# Patient Record
Sex: Female | Born: 1935
Health system: Southern US, Community
[De-identification: ages and names within clinical notes are randomized; demographics above are authoritative.]

## PROBLEM LIST (undated history)

## (undated) DIAGNOSIS — J189 Pneumonia, unspecified organism: Secondary | ICD-10-CM

## (undated) DIAGNOSIS — Z9289 Personal history of other medical treatment: Secondary | ICD-10-CM

## (undated) DIAGNOSIS — E039 Hypothyroidism, unspecified: Secondary | ICD-10-CM

## (undated) DIAGNOSIS — I10 Essential (primary) hypertension: Secondary | ICD-10-CM

## (undated) DIAGNOSIS — E7800 Pure hypercholesterolemia, unspecified: Secondary | ICD-10-CM

## (undated) DIAGNOSIS — N390 Urinary tract infection, site not specified: Secondary | ICD-10-CM

## (undated) DIAGNOSIS — M1990 Unspecified osteoarthritis, unspecified site: Secondary | ICD-10-CM

## (undated) DIAGNOSIS — D649 Anemia, unspecified: Secondary | ICD-10-CM

## (undated) DIAGNOSIS — I639 Cerebral infarction, unspecified: Secondary | ICD-10-CM

## (undated) HISTORY — PX: VAGINAL HYSTERECTOMY: SUR661

## (undated) HISTORY — PX: JOINT REPLACEMENT: SHX530

## (undated) HISTORY — DX: Essential (primary) hypertension: I10

---

## 1989-05-11 DIAGNOSIS — I639 Cerebral infarction, unspecified: Secondary | ICD-10-CM

## 1989-05-11 HISTORY — PX: MUSCLE BIOPSY: SHX716

## 1989-05-11 HISTORY — DX: Cerebral infarction, unspecified: I63.9

## 1998-06-15 ENCOUNTER — Other Ambulatory Visit: Admission: RE | Admit: 1998-06-15 | Discharge: 1998-06-15 | Payer: Self-pay | Admitting: Obstetrics and Gynecology

## 2003-06-07 ENCOUNTER — Encounter: Admission: RE | Admit: 2003-06-07 | Discharge: 2003-06-07 | Payer: Self-pay | Admitting: Rheumatology

## 2003-06-07 ENCOUNTER — Encounter: Payer: Self-pay | Admitting: Rheumatology

## 2010-02-21 ENCOUNTER — Other Ambulatory Visit: Admission: RE | Admit: 2010-02-21 | Discharge: 2010-02-21 | Payer: Self-pay | Admitting: Oncology

## 2010-02-27 ENCOUNTER — Inpatient Hospital Stay (HOSPITAL_COMMUNITY): Admission: RE | Admit: 2010-02-27 | Discharge: 2010-03-02 | Payer: Self-pay | Admitting: Orthopedic Surgery

## 2010-04-03 ENCOUNTER — Inpatient Hospital Stay (HOSPITAL_COMMUNITY): Admission: RE | Admit: 2010-04-03 | Discharge: 2010-04-06 | Payer: Self-pay | Admitting: Orthopedic Surgery

## 2010-09-10 HISTORY — PX: BLADDER SUSPENSION: SHX72

## 2010-09-10 HISTORY — PX: TOTAL KNEE ARTHROPLASTY: SHX125

## 2010-11-25 LAB — BASIC METABOLIC PANEL
BUN: 11 mg/dL (ref 6–23)
CO2: 23 mEq/L (ref 19–32)
CO2: 23 mEq/L (ref 19–32)
CO2: 23 mEq/L (ref 19–32)
Calcium: 8.7 mg/dL (ref 8.4–10.5)
Calcium: 8.9 mg/dL (ref 8.4–10.5)
Calcium: 9.7 mg/dL (ref 8.4–10.5)
Chloride: 110 mEq/L (ref 96–112)
Creatinine, Ser: 0.7 mg/dL (ref 0.4–1.2)
Creatinine, Ser: 0.76 mg/dL (ref 0.4–1.2)
Creatinine, Ser: 0.88 mg/dL (ref 0.4–1.2)
GFR calc Af Amer: >60 mL/min (ref >60)
GFR calc Af Amer: >60 mL/min (ref >60)
GFR calc non Af Amer: >60 mL/min (ref >60)
Glucose, Bld: 119 mg/dL — ABNORMAL HIGH (ref 70–99)
Glucose, Bld: 130 mg/dL — ABNORMAL HIGH (ref 70–99)
Glucose, Bld: 138 mg/dL — ABNORMAL HIGH (ref 70–99)
Sodium: 133 mEq/L — ABNORMAL LOW (ref 135–145)

## 2010-11-25 LAB — CBC
HCT: 31 % — ABNORMAL LOW (ref 36.0–46.0)
Hemoglobin: 8.3 g/dL — ABNORMAL LOW (ref 12.0–15.0)
MCH: 29.9 pg (ref 26.0–34.0)
MCH: 30 pg (ref 26.0–34.0)
MCH: 30 pg (ref 26.0–34.0)
MCHC: 34.6 g/dL (ref 30.0–36.0)
MCHC: 34.7 g/dL (ref 30.0–36.0)
MCHC: 34.9 g/dL (ref 30.0–36.0)
MCV: 86.1 fL (ref 78.0–100.0)
Platelets: 161 10*3/uL (ref 150–400)
Platelets: 87 10*3/uL — ABNORMAL LOW (ref 150–400)
RBC: 2.34 MIL/uL — ABNORMAL LOW (ref 3.87–5.11)
RBC: 3.6 MIL/uL — ABNORMAL LOW (ref 3.87–5.11)
RDW: 15.4 % (ref 11.5–15.5)
RDW: 15.6 % — ABNORMAL HIGH (ref 11.5–15.5)
WBC: 6.8 10*3/uL (ref 4.0–10.5)

## 2010-11-25 LAB — URINALYSIS, ROUTINE W REFLEX MICROSCOPIC
Protein, ur: NEGATIVE mg/dL
Urobilinogen, UA: 0.2 mg/dL (ref 0.0–1.0)

## 2010-11-25 LAB — APTT: aPTT: 33 seconds (ref 24–37)

## 2010-11-25 LAB — HEMOGLOBIN AND HEMATOCRIT, BLOOD: Hemoglobin: 6.8 g/dL — CL (ref 12.0–15.0)

## 2010-11-25 LAB — TYPE AND SCREEN: ABO/RH(D): O POS

## 2010-11-25 LAB — DIFFERENTIAL: Monocytes Absolute: 0.5 10*3/uL (ref 0.1–1.0)

## 2010-11-25 LAB — SURGICAL PCR SCREEN: Staphylococcus aureus: NEGATIVE

## 2010-11-26 LAB — BASIC METABOLIC PANEL
CO2: 23 mEq/L (ref 19–32)
Calcium: 8.8 mg/dL (ref 8.4–10.5)
Creatinine, Ser: 0.89 mg/dL (ref 0.4–1.2)
GFR calc Af Amer: >60 mL/min (ref >60)
GFR calc Af Amer: >60 mL/min (ref >60)
GFR calc non Af Amer: >60 mL/min (ref >60)
GFR calc non Af Amer: >60 mL/min (ref >60)
Glucose, Bld: 132 mg/dL — ABNORMAL HIGH (ref 70–99)
Potassium: 4.5 mEq/L (ref 3.5–5.1)
Sodium: 130 mEq/L — ABNORMAL LOW (ref 135–145)
Sodium: 132 mEq/L — ABNORMAL LOW (ref 135–145)

## 2010-11-26 LAB — CBC
HCT: 23.9 % — ABNORMAL LOW (ref 36.0–46.0)
Hemoglobin: 8 g/dL — ABNORMAL LOW (ref 12.0–15.0)
Hemoglobin: 8.2 g/dL — ABNORMAL LOW (ref 12.0–15.0)
MCHC: 33.4 g/dL (ref 30.0–36.0)
RBC: 2.76 MIL/uL — ABNORMAL LOW (ref 3.87–5.11)
RBC: 2.87 MIL/uL — ABNORMAL LOW (ref 3.87–5.11)
RDW: 14.6 % (ref 11.5–15.5)

## 2010-11-26 LAB — ABO/RH: ABO/RH(D): O POS

## 2010-11-26 LAB — TYPE AND SCREEN: Antibody Screen: NEGATIVE

## 2010-11-27 LAB — URINALYSIS, ROUTINE W REFLEX MICROSCOPIC
Bilirubin Urine: NEGATIVE
Glucose, UA: NEGATIVE mg/dL
Hgb urine dipstick: NEGATIVE
Ketones, ur: NEGATIVE mg/dL
Protein, ur: NEGATIVE mg/dL
pH: 7 (ref 5.0–8.0)

## 2010-11-27 LAB — CBC
HCT: 30.3 % — ABNORMAL LOW (ref 36.0–46.0)
Hemoglobin: 10.3 g/dL — ABNORMAL LOW (ref 12.0–15.0)
MCHC: 34 g/dL (ref 30.0–36.0)
Platelets: 211 10*3/uL (ref 150–400)
RDW: 13.8 % (ref 11.5–15.5)

## 2010-11-27 LAB — BASIC METABOLIC PANEL
BUN: 18 mg/dL (ref 6–23)
CO2: 23 mEq/L (ref 19–32)
GFR calc non Af Amer: >60 mL/min (ref >60)
Glucose, Bld: 93 mg/dL (ref 70–99)
Potassium: 4.4 mEq/L (ref 3.5–5.1)
Sodium: 134 mEq/L — ABNORMAL LOW (ref 135–145)

## 2010-11-27 LAB — DIFFERENTIAL
Basophils Absolute: 0 10*3/uL (ref 0.0–0.1)
Basophils Relative: 1 % (ref 0–1)
Eosinophils Absolute: 0.1 10*3/uL (ref 0.0–0.7)
Eosinophils Relative: 1 % (ref 0–5)
Lymphocytes Relative: 22 % (ref 12–46)
Monocytes Absolute: 0.6 10*3/uL (ref 0.1–1.0)

## 2010-11-27 LAB — PROTIME-INR: Prothrombin Time: 15.5 seconds — ABNORMAL HIGH (ref 11.6–15.2)

## 2011-09-24 DIAGNOSIS — D649 Anemia, unspecified: Secondary | ICD-10-CM | POA: Diagnosis not present

## 2011-09-26 DIAGNOSIS — I10 Essential (primary) hypertension: Secondary | ICD-10-CM | POA: Diagnosis not present

## 2011-09-26 DIAGNOSIS — Z79899 Other long term (current) drug therapy: Secondary | ICD-10-CM | POA: Diagnosis not present

## 2011-09-26 DIAGNOSIS — Z6831 Body mass index (BMI) 31.0-31.9, adult: Secondary | ICD-10-CM | POA: Diagnosis not present

## 2011-09-26 DIAGNOSIS — I6789 Other cerebrovascular disease: Secondary | ICD-10-CM | POA: Diagnosis not present

## 2011-09-26 DIAGNOSIS — E785 Hyperlipidemia, unspecified: Secondary | ICD-10-CM | POA: Diagnosis not present

## 2011-10-15 DIAGNOSIS — R339 Retention of urine, unspecified: Secondary | ICD-10-CM | POA: Diagnosis not present

## 2011-10-15 DIAGNOSIS — N3090 Cystitis, unspecified without hematuria: Secondary | ICD-10-CM | POA: Diagnosis not present

## 2011-11-22 DIAGNOSIS — D649 Anemia, unspecified: Secondary | ICD-10-CM | POA: Diagnosis not present

## 2011-11-22 DIAGNOSIS — D509 Iron deficiency anemia, unspecified: Secondary | ICD-10-CM | POA: Diagnosis not present

## 2011-11-26 DIAGNOSIS — R339 Retention of urine, unspecified: Secondary | ICD-10-CM | POA: Diagnosis not present

## 2011-11-26 DIAGNOSIS — R351 Nocturia: Secondary | ICD-10-CM | POA: Diagnosis not present

## 2011-11-26 DIAGNOSIS — N390 Urinary tract infection, site not specified: Secondary | ICD-10-CM | POA: Diagnosis not present

## 2011-12-28 DIAGNOSIS — R339 Retention of urine, unspecified: Secondary | ICD-10-CM | POA: Diagnosis not present

## 2011-12-28 DIAGNOSIS — N3090 Cystitis, unspecified without hematuria: Secondary | ICD-10-CM | POA: Diagnosis not present

## 2011-12-28 DIAGNOSIS — N390 Urinary tract infection, site not specified: Secondary | ICD-10-CM | POA: Diagnosis not present

## 2012-01-01 DIAGNOSIS — I10 Essential (primary) hypertension: Secondary | ICD-10-CM | POA: Diagnosis not present

## 2012-01-01 DIAGNOSIS — E785 Hyperlipidemia, unspecified: Secondary | ICD-10-CM | POA: Diagnosis not present

## 2012-01-01 DIAGNOSIS — D509 Iron deficiency anemia, unspecified: Secondary | ICD-10-CM | POA: Diagnosis not present

## 2012-01-01 DIAGNOSIS — R0789 Other chest pain: Secondary | ICD-10-CM | POA: Diagnosis not present

## 2012-01-01 DIAGNOSIS — Z79899 Other long term (current) drug therapy: Secondary | ICD-10-CM | POA: Diagnosis not present

## 2012-01-01 DIAGNOSIS — I6789 Other cerebrovascular disease: Secondary | ICD-10-CM | POA: Diagnosis not present

## 2012-01-01 DIAGNOSIS — Z6831 Body mass index (BMI) 31.0-31.9, adult: Secondary | ICD-10-CM | POA: Diagnosis not present

## 2012-01-09 DIAGNOSIS — R748 Abnormal levels of other serum enzymes: Secondary | ICD-10-CM | POA: Diagnosis not present

## 2012-01-09 DIAGNOSIS — N281 Cyst of kidney, acquired: Secondary | ICD-10-CM | POA: Diagnosis not present

## 2012-01-14 DIAGNOSIS — Z1231 Encounter for screening mammogram for malignant neoplasm of breast: Secondary | ICD-10-CM | POA: Diagnosis not present

## 2012-01-17 DIAGNOSIS — R079 Chest pain, unspecified: Secondary | ICD-10-CM | POA: Diagnosis not present

## 2012-01-22 DIAGNOSIS — E785 Hyperlipidemia, unspecified: Secondary | ICD-10-CM | POA: Diagnosis not present

## 2012-01-22 DIAGNOSIS — N039 Chronic nephritic syndrome with unspecified morphologic changes: Secondary | ICD-10-CM | POA: Diagnosis not present

## 2012-01-22 DIAGNOSIS — D631 Anemia in chronic kidney disease: Secondary | ICD-10-CM | POA: Diagnosis not present

## 2012-01-22 DIAGNOSIS — N189 Chronic kidney disease, unspecified: Secondary | ICD-10-CM | POA: Diagnosis not present

## 2012-01-22 DIAGNOSIS — I10 Essential (primary) hypertension: Secondary | ICD-10-CM | POA: Diagnosis not present

## 2012-02-01 DIAGNOSIS — N390 Urinary tract infection, site not specified: Secondary | ICD-10-CM | POA: Diagnosis not present

## 2012-02-01 DIAGNOSIS — R339 Retention of urine, unspecified: Secondary | ICD-10-CM | POA: Diagnosis not present

## 2012-03-03 DIAGNOSIS — N3090 Cystitis, unspecified without hematuria: Secondary | ICD-10-CM | POA: Diagnosis not present

## 2012-03-03 DIAGNOSIS — N390 Urinary tract infection, site not specified: Secondary | ICD-10-CM | POA: Diagnosis not present

## 2012-03-03 DIAGNOSIS — R339 Retention of urine, unspecified: Secondary | ICD-10-CM | POA: Diagnosis not present

## 2012-03-24 DIAGNOSIS — D509 Iron deficiency anemia, unspecified: Secondary | ICD-10-CM | POA: Diagnosis not present

## 2012-04-02 DIAGNOSIS — N390 Urinary tract infection, site not specified: Secondary | ICD-10-CM | POA: Diagnosis not present

## 2012-04-02 DIAGNOSIS — R339 Retention of urine, unspecified: Secondary | ICD-10-CM | POA: Diagnosis not present

## 2012-04-07 DIAGNOSIS — Z6831 Body mass index (BMI) 31.0-31.9, adult: Secondary | ICD-10-CM | POA: Diagnosis not present

## 2012-04-07 DIAGNOSIS — I10 Essential (primary) hypertension: Secondary | ICD-10-CM | POA: Diagnosis not present

## 2012-04-07 DIAGNOSIS — E785 Hyperlipidemia, unspecified: Secondary | ICD-10-CM | POA: Diagnosis not present

## 2012-04-07 DIAGNOSIS — D509 Iron deficiency anemia, unspecified: Secondary | ICD-10-CM | POA: Diagnosis not present

## 2012-04-07 DIAGNOSIS — I6789 Other cerebrovascular disease: Secondary | ICD-10-CM | POA: Diagnosis not present

## 2012-05-07 DIAGNOSIS — N390 Urinary tract infection, site not specified: Secondary | ICD-10-CM | POA: Diagnosis not present

## 2012-05-07 DIAGNOSIS — R339 Retention of urine, unspecified: Secondary | ICD-10-CM | POA: Diagnosis not present

## 2012-05-23 DIAGNOSIS — N189 Chronic kidney disease, unspecified: Secondary | ICD-10-CM | POA: Diagnosis not present

## 2012-05-23 DIAGNOSIS — N039 Chronic nephritic syndrome with unspecified morphologic changes: Secondary | ICD-10-CM | POA: Diagnosis not present

## 2012-05-23 DIAGNOSIS — D631 Anemia in chronic kidney disease: Secondary | ICD-10-CM | POA: Diagnosis not present

## 2012-05-23 DIAGNOSIS — D509 Iron deficiency anemia, unspecified: Secondary | ICD-10-CM | POA: Diagnosis not present

## 2012-06-03 DIAGNOSIS — J342 Deviated nasal septum: Secondary | ICD-10-CM | POA: Diagnosis not present

## 2012-06-03 DIAGNOSIS — H9190 Unspecified hearing loss, unspecified ear: Secondary | ICD-10-CM | POA: Diagnosis not present

## 2012-06-03 DIAGNOSIS — H9319 Tinnitus, unspecified ear: Secondary | ICD-10-CM | POA: Diagnosis not present

## 2012-06-06 DIAGNOSIS — E291 Testicular hypofunction: Secondary | ICD-10-CM | POA: Diagnosis not present

## 2012-06-06 DIAGNOSIS — N200 Calculus of kidney: Secondary | ICD-10-CM | POA: Diagnosis not present

## 2012-06-12 DIAGNOSIS — H9120 Sudden idiopathic hearing loss, unspecified ear: Secondary | ICD-10-CM | POA: Diagnosis not present

## 2012-06-12 DIAGNOSIS — H905 Unspecified sensorineural hearing loss: Secondary | ICD-10-CM | POA: Diagnosis not present

## 2012-06-12 DIAGNOSIS — H903 Sensorineural hearing loss, bilateral: Secondary | ICD-10-CM | POA: Diagnosis not present

## 2012-06-12 DIAGNOSIS — H9319 Tinnitus, unspecified ear: Secondary | ICD-10-CM | POA: Diagnosis not present

## 2012-06-17 DIAGNOSIS — H905 Unspecified sensorineural hearing loss: Secondary | ICD-10-CM | POA: Diagnosis not present

## 2012-06-19 DIAGNOSIS — H9120 Sudden idiopathic hearing loss, unspecified ear: Secondary | ICD-10-CM | POA: Diagnosis not present

## 2012-06-19 DIAGNOSIS — H905 Unspecified sensorineural hearing loss: Secondary | ICD-10-CM | POA: Diagnosis not present

## 2012-06-24 DIAGNOSIS — H2589 Other age-related cataract: Secondary | ICD-10-CM | POA: Diagnosis not present

## 2012-07-07 DIAGNOSIS — N390 Urinary tract infection, site not specified: Secondary | ICD-10-CM | POA: Diagnosis not present

## 2012-07-07 DIAGNOSIS — R339 Retention of urine, unspecified: Secondary | ICD-10-CM | POA: Diagnosis not present

## 2012-07-09 DIAGNOSIS — H903 Sensorineural hearing loss, bilateral: Secondary | ICD-10-CM | POA: Diagnosis not present

## 2012-07-09 DIAGNOSIS — H9120 Sudden idiopathic hearing loss, unspecified ear: Secondary | ICD-10-CM | POA: Diagnosis not present

## 2012-07-09 DIAGNOSIS — H905 Unspecified sensorineural hearing loss: Secondary | ICD-10-CM | POA: Diagnosis not present

## 2012-07-10 DIAGNOSIS — D509 Iron deficiency anemia, unspecified: Secondary | ICD-10-CM | POA: Diagnosis not present

## 2012-07-10 DIAGNOSIS — Z23 Encounter for immunization: Secondary | ICD-10-CM | POA: Diagnosis not present

## 2012-07-10 DIAGNOSIS — I10 Essential (primary) hypertension: Secondary | ICD-10-CM | POA: Diagnosis not present

## 2012-07-10 DIAGNOSIS — E785 Hyperlipidemia, unspecified: Secondary | ICD-10-CM | POA: Diagnosis not present

## 2012-07-10 DIAGNOSIS — I6789 Other cerebrovascular disease: Secondary | ICD-10-CM | POA: Diagnosis not present

## 2012-07-14 DIAGNOSIS — D696 Thrombocytopenia, unspecified: Secondary | ICD-10-CM | POA: Diagnosis not present

## 2012-07-16 DIAGNOSIS — Q619 Cystic kidney disease, unspecified: Secondary | ICD-10-CM | POA: Diagnosis not present

## 2012-07-16 DIAGNOSIS — D696 Thrombocytopenia, unspecified: Secondary | ICD-10-CM | POA: Diagnosis not present

## 2012-07-16 DIAGNOSIS — R748 Abnormal levels of other serum enzymes: Secondary | ICD-10-CM | POA: Diagnosis not present

## 2012-07-16 DIAGNOSIS — R161 Splenomegaly, not elsewhere classified: Secondary | ICD-10-CM | POA: Diagnosis not present

## 2012-07-23 DIAGNOSIS — H9319 Tinnitus, unspecified ear: Secondary | ICD-10-CM | POA: Diagnosis not present

## 2012-07-23 DIAGNOSIS — H905 Unspecified sensorineural hearing loss: Secondary | ICD-10-CM | POA: Diagnosis not present

## 2012-07-23 DIAGNOSIS — H903 Sensorineural hearing loss, bilateral: Secondary | ICD-10-CM | POA: Diagnosis not present

## 2012-08-04 DIAGNOSIS — N390 Urinary tract infection, site not specified: Secondary | ICD-10-CM | POA: Diagnosis not present

## 2012-09-08 DIAGNOSIS — N3090 Cystitis, unspecified without hematuria: Secondary | ICD-10-CM | POA: Diagnosis not present

## 2012-09-08 DIAGNOSIS — R339 Retention of urine, unspecified: Secondary | ICD-10-CM | POA: Diagnosis not present

## 2012-10-13 DIAGNOSIS — I10 Essential (primary) hypertension: Secondary | ICD-10-CM | POA: Diagnosis not present

## 2012-10-13 DIAGNOSIS — E785 Hyperlipidemia, unspecified: Secondary | ICD-10-CM | POA: Diagnosis not present

## 2012-10-13 DIAGNOSIS — I6789 Other cerebrovascular disease: Secondary | ICD-10-CM | POA: Diagnosis not present

## 2012-10-13 DIAGNOSIS — D509 Iron deficiency anemia, unspecified: Secondary | ICD-10-CM | POA: Diagnosis not present

## 2012-10-13 DIAGNOSIS — Z6831 Body mass index (BMI) 31.0-31.9, adult: Secondary | ICD-10-CM | POA: Diagnosis not present

## 2012-10-21 DIAGNOSIS — N390 Urinary tract infection, site not specified: Secondary | ICD-10-CM | POA: Diagnosis not present

## 2012-10-29 DIAGNOSIS — M899 Disorder of bone, unspecified: Secondary | ICD-10-CM | POA: Diagnosis not present

## 2012-10-29 DIAGNOSIS — N951 Menopausal and female climacteric states: Secondary | ICD-10-CM | POA: Diagnosis not present

## 2012-11-27 DIAGNOSIS — Z862 Personal history of diseases of the blood and blood-forming organs and certain disorders involving the immune mechanism: Secondary | ICD-10-CM | POA: Diagnosis not present

## 2012-11-27 DIAGNOSIS — Z09 Encounter for follow-up examination after completed treatment for conditions other than malignant neoplasm: Secondary | ICD-10-CM | POA: Diagnosis not present

## 2012-12-19 DIAGNOSIS — N390 Urinary tract infection, site not specified: Secondary | ICD-10-CM | POA: Diagnosis not present

## 2013-01-19 DIAGNOSIS — Z6831 Body mass index (BMI) 31.0-31.9, adult: Secondary | ICD-10-CM | POA: Diagnosis not present

## 2013-01-19 DIAGNOSIS — E785 Hyperlipidemia, unspecified: Secondary | ICD-10-CM | POA: Diagnosis not present

## 2013-01-19 DIAGNOSIS — D509 Iron deficiency anemia, unspecified: Secondary | ICD-10-CM | POA: Diagnosis not present

## 2013-01-19 DIAGNOSIS — Z9181 History of falling: Secondary | ICD-10-CM | POA: Diagnosis not present

## 2013-01-19 DIAGNOSIS — I6789 Other cerebrovascular disease: Secondary | ICD-10-CM | POA: Diagnosis not present

## 2013-01-19 DIAGNOSIS — I10 Essential (primary) hypertension: Secondary | ICD-10-CM | POA: Diagnosis not present

## 2013-01-19 DIAGNOSIS — Z1331 Encounter for screening for depression: Secondary | ICD-10-CM | POA: Diagnosis not present

## 2013-01-19 DIAGNOSIS — N390 Urinary tract infection, site not specified: Secondary | ICD-10-CM | POA: Diagnosis not present

## 2013-01-23 DIAGNOSIS — Z1231 Encounter for screening mammogram for malignant neoplasm of breast: Secondary | ICD-10-CM | POA: Diagnosis not present

## 2013-02-11 DIAGNOSIS — K5730 Diverticulosis of large intestine without perforation or abscess without bleeding: Secondary | ICD-10-CM | POA: Diagnosis not present

## 2013-02-11 DIAGNOSIS — R945 Abnormal results of liver function studies: Secondary | ICD-10-CM | POA: Diagnosis not present

## 2013-02-12 DIAGNOSIS — N390 Urinary tract infection, site not specified: Secondary | ICD-10-CM | POA: Diagnosis not present

## 2013-02-12 DIAGNOSIS — R339 Retention of urine, unspecified: Secondary | ICD-10-CM | POA: Diagnosis not present

## 2013-04-16 DIAGNOSIS — N390 Urinary tract infection, site not specified: Secondary | ICD-10-CM | POA: Diagnosis not present

## 2013-04-24 DIAGNOSIS — I10 Essential (primary) hypertension: Secondary | ICD-10-CM | POA: Diagnosis not present

## 2013-04-24 DIAGNOSIS — Z6832 Body mass index (BMI) 32.0-32.9, adult: Secondary | ICD-10-CM | POA: Diagnosis not present

## 2013-04-24 DIAGNOSIS — I6789 Other cerebrovascular disease: Secondary | ICD-10-CM | POA: Diagnosis not present

## 2013-04-24 DIAGNOSIS — E785 Hyperlipidemia, unspecified: Secondary | ICD-10-CM | POA: Diagnosis not present

## 2013-04-24 DIAGNOSIS — D509 Iron deficiency anemia, unspecified: Secondary | ICD-10-CM | POA: Diagnosis not present

## 2013-05-18 DIAGNOSIS — R339 Retention of urine, unspecified: Secondary | ICD-10-CM | POA: Diagnosis not present

## 2013-05-18 DIAGNOSIS — N390 Urinary tract infection, site not specified: Secondary | ICD-10-CM | POA: Diagnosis not present

## 2013-05-20 DIAGNOSIS — N390 Urinary tract infection, site not specified: Secondary | ICD-10-CM | POA: Diagnosis not present

## 2013-06-16 DIAGNOSIS — L570 Actinic keratosis: Secondary | ICD-10-CM | POA: Diagnosis not present

## 2013-06-25 DIAGNOSIS — H2589 Other age-related cataract: Secondary | ICD-10-CM | POA: Diagnosis not present

## 2013-06-25 DIAGNOSIS — H524 Presbyopia: Secondary | ICD-10-CM | POA: Diagnosis not present

## 2013-07-28 DIAGNOSIS — N390 Urinary tract infection, site not specified: Secondary | ICD-10-CM | POA: Diagnosis not present

## 2013-07-29 DIAGNOSIS — Z6833 Body mass index (BMI) 33.0-33.9, adult: Secondary | ICD-10-CM | POA: Diagnosis not present

## 2013-07-29 DIAGNOSIS — I10 Essential (primary) hypertension: Secondary | ICD-10-CM | POA: Diagnosis not present

## 2013-07-29 DIAGNOSIS — I6789 Other cerebrovascular disease: Secondary | ICD-10-CM | POA: Diagnosis not present

## 2013-07-29 DIAGNOSIS — L659 Nonscarring hair loss, unspecified: Secondary | ICD-10-CM | POA: Diagnosis not present

## 2013-07-29 DIAGNOSIS — D509 Iron deficiency anemia, unspecified: Secondary | ICD-10-CM | POA: Diagnosis not present

## 2013-07-29 DIAGNOSIS — Z23 Encounter for immunization: Secondary | ICD-10-CM | POA: Diagnosis not present

## 2013-07-29 DIAGNOSIS — E785 Hyperlipidemia, unspecified: Secondary | ICD-10-CM | POA: Diagnosis not present

## 2013-08-12 DIAGNOSIS — I10 Essential (primary) hypertension: Secondary | ICD-10-CM | POA: Diagnosis not present

## 2013-08-12 DIAGNOSIS — Z6833 Body mass index (BMI) 33.0-33.9, adult: Secondary | ICD-10-CM | POA: Diagnosis not present

## 2013-08-12 DIAGNOSIS — Z23 Encounter for immunization: Secondary | ICD-10-CM | POA: Diagnosis not present

## 2013-08-17 DIAGNOSIS — R351 Nocturia: Secondary | ICD-10-CM | POA: Diagnosis not present

## 2013-08-17 DIAGNOSIS — N390 Urinary tract infection, site not specified: Secondary | ICD-10-CM | POA: Diagnosis not present

## 2013-08-17 DIAGNOSIS — R339 Retention of urine, unspecified: Secondary | ICD-10-CM | POA: Diagnosis not present

## 2013-08-26 DIAGNOSIS — E039 Hypothyroidism, unspecified: Secondary | ICD-10-CM | POA: Diagnosis not present

## 2013-09-24 DIAGNOSIS — N390 Urinary tract infection, site not specified: Secondary | ICD-10-CM | POA: Diagnosis not present

## 2013-09-28 DIAGNOSIS — Z96659 Presence of unspecified artificial knee joint: Secondary | ICD-10-CM | POA: Diagnosis not present

## 2013-09-28 DIAGNOSIS — M1710 Unilateral primary osteoarthritis, unspecified knee: Secondary | ICD-10-CM | POA: Diagnosis not present

## 2013-09-28 DIAGNOSIS — IMO0002 Reserved for concepts with insufficient information to code with codable children: Secondary | ICD-10-CM | POA: Diagnosis not present

## 2013-10-05 DIAGNOSIS — N390 Urinary tract infection, site not specified: Secondary | ICD-10-CM | POA: Diagnosis not present

## 2013-10-07 DIAGNOSIS — R161 Splenomegaly, not elsewhere classified: Secondary | ICD-10-CM | POA: Diagnosis not present

## 2013-10-07 DIAGNOSIS — I728 Aneurysm of other specified arteries: Secondary | ICD-10-CM | POA: Diagnosis not present

## 2013-10-07 DIAGNOSIS — R1031 Right lower quadrant pain: Secondary | ICD-10-CM | POA: Diagnosis not present

## 2013-10-07 DIAGNOSIS — R109 Unspecified abdominal pain: Secondary | ICD-10-CM | POA: Diagnosis not present

## 2013-10-07 DIAGNOSIS — N390 Urinary tract infection, site not specified: Secondary | ICD-10-CM | POA: Diagnosis not present

## 2013-10-12 DIAGNOSIS — Z6832 Body mass index (BMI) 32.0-32.9, adult: Secondary | ICD-10-CM | POA: Diagnosis not present

## 2013-10-12 DIAGNOSIS — N390 Urinary tract infection, site not specified: Secondary | ICD-10-CM | POA: Diagnosis not present

## 2013-10-12 DIAGNOSIS — R1031 Right lower quadrant pain: Secondary | ICD-10-CM | POA: Diagnosis not present

## 2013-10-12 DIAGNOSIS — I728 Aneurysm of other specified arteries: Secondary | ICD-10-CM | POA: Diagnosis not present

## 2013-10-23 ENCOUNTER — Encounter: Payer: Self-pay | Admitting: Surgery

## 2013-10-26 ENCOUNTER — Encounter: Payer: Self-pay | Admitting: Surgery

## 2013-11-12 ENCOUNTER — Encounter: Payer: Self-pay | Admitting: Vascular Surgery

## 2013-11-23 DIAGNOSIS — R339 Retention of urine, unspecified: Secondary | ICD-10-CM | POA: Diagnosis not present

## 2013-11-23 DIAGNOSIS — N390 Urinary tract infection, site not specified: Secondary | ICD-10-CM | POA: Diagnosis not present

## 2013-11-25 DIAGNOSIS — E039 Hypothyroidism, unspecified: Secondary | ICD-10-CM | POA: Diagnosis not present

## 2013-11-25 DIAGNOSIS — I10 Essential (primary) hypertension: Secondary | ICD-10-CM | POA: Diagnosis not present

## 2013-11-25 DIAGNOSIS — D509 Iron deficiency anemia, unspecified: Secondary | ICD-10-CM | POA: Diagnosis not present

## 2013-11-25 DIAGNOSIS — I6789 Other cerebrovascular disease: Secondary | ICD-10-CM | POA: Diagnosis not present

## 2013-11-25 DIAGNOSIS — E785 Hyperlipidemia, unspecified: Secondary | ICD-10-CM | POA: Diagnosis not present

## 2013-11-25 DIAGNOSIS — Z6831 Body mass index (BMI) 31.0-31.9, adult: Secondary | ICD-10-CM | POA: Diagnosis not present

## 2013-12-03 ENCOUNTER — Encounter: Payer: Self-pay | Admitting: Vascular Surgery

## 2013-12-04 ENCOUNTER — Ambulatory Visit (INDEPENDENT_AMBULATORY_CARE_PROVIDER_SITE_OTHER): Payer: Medicare Other | Admitting: Vascular Surgery

## 2013-12-04 ENCOUNTER — Encounter: Payer: Self-pay | Admitting: Vascular Surgery

## 2013-12-04 VITALS — BP 143/81 | HR 95 | Resp 16 | Ht 67.0 in | Wt 201.0 lb

## 2013-12-04 DIAGNOSIS — Z01810 Encounter for preprocedural cardiovascular examination: Secondary | ICD-10-CM

## 2013-12-04 DIAGNOSIS — I728 Aneurysm of other specified arteries: Secondary | ICD-10-CM | POA: Insufficient documentation

## 2013-12-04 DIAGNOSIS — Z23 Encounter for immunization: Secondary | ICD-10-CM | POA: Diagnosis not present

## 2013-12-04 NOTE — Progress Notes (Signed)
[facility] [place]  Referred by:  [facility] ED  Reason for referral: splenic artery aneurysm  History of Present Illness  [name] is a 80 y.o. (19-Nov-1933) female who presents with chief complaint: splenic artery aneurysm.  Pt was sent in ED for severe RLQ pain.  Work-up at that time revealed: UTI and stable SAA 3.2 cm x 3.3 cm.  The patient denies any abdominal pain at this time.  She is not a smoker and has no family history of aneurysmal disease.  She has previously had two children.    Past Medical History  Diagnosis Date  . Hypertension    Past Surgical History  Procedure Laterality Date  . Arm surgery    . Abdominal hysterectomy    . Bladder surgery    . Joint replacement      knee bilateral   History   Social History  . Marital Status: Married    Spouse Name: N/A    Number of Children: N/A  . Years of Education: N/A   Occupational History  . Not on file.   Social History Main Topics  . Smoking status: Never Smoker   . Smokeless tobacco: Never Used  . Alcohol Use: No  . Drug Use: No  . Sexual Activity: Not on file   Other Topics Concern  . Not on file   Social History Narrative  . No narrative on file   Family History: neither mom nor father had significant medical problems which would contribute this patient's condition, no aneurysmal history  Current Outpatient Prescriptions  Medication Sig Dispense Refill  . bethanechol (URECHOLINE) 50 MG tablet Take 50 mg by mouth 2 (two) times daily.      . Calcium Carbonate-Vitamin D (CALCIUM 500 + D PO) Take 500 mg by mouth 2 (two) times daily.      . cholecalciferol (VITAMIN D) 1000 UNITS tablet Take 2,000 Units by mouth daily.      . ciprofloxacin (CIPRO) 250 MG/5ML (5%) SUSR Take 250 mg by mouth 2 (two) times daily.      . clopidogrel (PLAVIX) 75 MG tablet Take 75 mg by mouth daily with breakfast.      . Cranberry-Vitamin C-Vitamin E (CRANBERRY PLUS VITAMIN C) 140-100-3  MG-MG-UNIT CAPS Take 2,000 Units by mouth 2 (two) times daily.      Marland Kitchen ezetimibe (ZETIA) 10 MG tablet Take 10 mg by mouth daily.      . folic acid (FOLVITE) 400 MCG tablet Take 400 mcg by mouth daily.      Marland Kitchen labetalol (NORMODYNE) 200 MG tablet Take 200 mg by mouth 2 (two) times daily. Take 1 1/2 tablets twice daily      . LEVOTHYROXINE SODIUM PO Take 0.5 mg by mouth.      Marland Kitchen lisinopril (PRINIVIL,ZESTRIL) 40 MG tablet Take 40 mg by mouth daily.      . nitrofurantoin (MACRODANTIN) 100 MG capsule Take 100 mg by mouth daily.      . Omega-3 Fatty Acids (FISH OIL) 1000 MG CAPS Take 1,000 mg by mouth 2 (two) times daily.      . psyllium (METAMUCIL) 58.6 % packet Take 1 packet by mouth daily.      . tamsulosin (FLOMAX) 0.4 MG CAPS capsule Take 0.4 mg by mouth 2 (two) times daily.       No current facility-administered medications for this visit.     Allergies  Allergen Reactions  . Aspirin Other (See Comments)  Causes blood in urine   REVIEW OF SYSTEMS:  (Positives checked otherwise negative)  CARDIOVASCULAR:  []  chest pain, []  chest pressure, []  palpitations, []  shortness of breath when laying flat, []  shortness of breath with exertion,  []  pain in feet when walking, []  pain in feet when laying flat, []  history of blood clot in veins (DVT), []  history of phlebitis, []  swelling in legs, []  varicose veins  PULMONARY:  []  productive cough, []  asthma, []  wheezing  NEUROLOGIC:  []  weakness in arms or legs, []  numbness in arms or legs, []  difficulty speaking or slurred speech, []  temporary loss of vision in one eye, []  dizziness  HEMATOLOGIC:  []  bleeding problems, []  problems with blood clotting too easily  MUSCULOSKEL:  []  joint pain, []  joint swelling  GASTROINTEST:  []  vomiting blood, []  blood in stool     GENITOURINARY:  []  burning with urination, [x]  blood in urine: reported due to ASA  PSYCHIATRIC:  []  history of major depression  INTEGUMENTARY:  []  rashes, []  ulcers  CONSTITUTIONAL:   []  fever, []  chills  For VQI Use Only  PRE-ADM LIVING: Home  AMB STATUS: Ambulatory  CAD Sx: None  PRIOR CHF: None  STRESS TEST: [x]  No, [ ]  Normal, [ ]  + ischemia, [ ]  + MI, [ ]  Both  Physical Examination  Filed Vitals:   12/04/13 1257  BP: 143/81  Pulse: 95  Resp: 16  Height: 5\' 7"  (1.702 m)  Weight: 201 lb (91.173 kg)    Body mass index is 31.47 kg/(m^2).  General: A&O x 3, WD, elderly  Head: [place]/AT  Ear/Nose/Throat: Hearing grossly intact, nares w/o erythema or drainage, oropharynx w/o Erythema/Exudate, Mallampati score: 3  Eyes: PERRLA, EOMI  Neck: Supple, no nuchal rigidity, no palpable LAD  Pulmonary: Sym exp, good air movt, CTAB, no rales, rhonchi, & wheezing  Cardiac: RRR, Nl S1, S2, no Murmurs, rubs or gallops  Vascular: Vessel Right Left  Radial Palpable Palpable  Ulnar Palpable Palpable  Brachial Palpable Palpable  Carotid Palpable, without bruit Palpable, without bruit  Aorta Not palpable N/A  Femoral Palpable Palpable  Popliteal No palpable aneurysm No palpable aneurysm  PT  Palpable  Palpable  DP  Palpable  Palpable   Gastrointestinal: soft, NTND, -G/R, - HSM, - masses, - CVAT B, no AAA palpable  Musculoskeletal: M/S 5/5 throughout , Extremities without ischemic changes , B spider veins, B extensive varicose veins  Neurologic: CN 2-12 intact , Pain and light touch intact in extremities , Motor exam as listed above  Psychiatric: Judgment intact, Mood & affect appropriate for pt's clinical situation  Dermatologic: See M/S exam for extremity exam, no rashes otherwise noted  Lymph : No Cervical, Axillary, or Inguinal lymphadenopathy   Outside Studies/Documentation 5 pages of outside documents were reviewed including: OSH ED lab work and CT report.  Medical Decision Making  [name] is a 80 y.o. female who presents with: asx SAA > 2 cm   The primary risk of SAA is rupture, so repair is recommended once SAA is > 2 cm, hence,  this patient will need repair of the SAA.    Modern repair of such tends to be via an endovascular technique with splenectomy reserved for splenic infarction or infection from infarction.  A CTA abd/pelvis will be needed to get important anatomic information to determine if she is a candidate for stenting vs embolization.  I discussed with the patient the importance of the spleen in fighting infections.  I recommend vaccination well advance of possible splenic intervention for encapsulated organisms: H. Influenza, pneumococcal, and meningococal.  I have given her a rx to get such vaccinations from her PCP.  The patient will follow up with me in 4 weeks with the CTA.  I anticipate getting the patient scheduled within the 1-2 weeks of her follow-up, hence the importance of her getting the vaccines now.  Thank you for allowing [place] to participate in this patient's care.  Leonides Sake, MD [facility] [place] Office: [phone] Pager: [phone]  12/04/2013, 1:36 PM

## 2013-12-07 NOTE — Addendum Note (Signed)
Addended by: Mena Goes on: 12/07/2013 05:38 PM   Modules accepted: Orders

## 2013-12-09 DIAGNOSIS — Z01810 Encounter for preprocedural cardiovascular examination: Secondary | ICD-10-CM | POA: Diagnosis not present

## 2013-12-16 DIAGNOSIS — N390 Urinary tract infection, site not specified: Secondary | ICD-10-CM | POA: Diagnosis not present

## 2013-12-30 ENCOUNTER — Encounter: Payer: Self-pay | Admitting: Vascular Surgery

## 2013-12-31 ENCOUNTER — Ambulatory Visit (INDEPENDENT_AMBULATORY_CARE_PROVIDER_SITE_OTHER): Payer: Medicare Other | Admitting: Vascular Surgery

## 2013-12-31 ENCOUNTER — Ambulatory Visit
Admission: RE | Admit: 2013-12-31 | Discharge: 2013-12-31 | Disposition: A | Discharge status: Home / Self Care | Payer: Medicare Other | Source: Ambulatory Visit | Attending: Vascular Surgery | Admitting: Vascular Surgery

## 2013-12-31 ENCOUNTER — Encounter: Payer: Self-pay | Admitting: Vascular Surgery

## 2013-12-31 VITALS — BP 161/99 | HR 67 | Resp 18 | Ht 67.0 in | Wt 199.0 lb

## 2013-12-31 DIAGNOSIS — I728 Aneurysm of other specified arteries: Secondary | ICD-10-CM

## 2013-12-31 DIAGNOSIS — I722 Aneurysm of renal artery: Secondary | ICD-10-CM | POA: Diagnosis not present

## 2013-12-31 DIAGNOSIS — Z01810 Encounter for preprocedural cardiovascular examination: Secondary | ICD-10-CM

## 2013-12-31 MED ORDER — IOHEXOL 350 MG/ML SOLN
100.0000 mL | Freq: Once | INTRAVENOUS | Status: AC | PRN
  Administered 2013-12-31: 100 mL via INTRAVENOUS

## 2013-12-31 NOTE — Progress Notes (Signed)
Established Splenic Artery Aneurysm  History of Present Illness  The patient is a 80 y.o. (Sep 21, 1933) female who presents with chief complaint: follow up for SAA.  Previous studies demonstrate an SAA, measuring 3.1 cm.  The patient does not have back or abdominal pain.  The patient is not a smoker.  She returns today from her CTA Abd/pelvis.  The pt notes having gotten: H. Influenza, Pneumococcal, and meningococcal Vac.  The patient's PMH, PSH, SH, FamHx, Med, and Allergies are unchanged from 12/04/13.  On ROS today: no abd pain, no fever or chills  Physical Examination  Filed Vitals:   12/31/13 1308  BP: 161/99  Pulse: 67  Resp: 18  Height: 5\' 7"  (1.702 m)  Weight: 199 lb (90.266 kg)   Body mass index is 31.16 kg/(m^2).  General: A&O x 3, WD, mildly obese  Pulmonary: Sym exp, good air movt, CTAB, no rales, rhonchi, & wheezing  Cardiac: RRR, Nl S1, S2, no Murmurs, rubs or gallops  Vascular: Vessel Right Left  Radial Palpable Palpable  Brachial Palpable Palpable  Carotid Palpable, without bruit Palpable, without bruit  Aorta Not palpable N/A  Femoral Palpable Palpable  Popliteal Not palpable Not palpable  PT Palpable Palpable  DP Palpable Palpable   Gastrointestinal: soft, NTND, -G/R, - HSM, - masses, - CVAT B  Musculoskeletal: M/S 5/5 throughout , Extremities without ischemic changes   Neurologic: Pain and light touch intact in extremities , Motor exam as listed above  CTA Abd/Pelvis (12/31/2013)  1. Slight enlargement of 3.2 cm distal splenic artery aneurysm. This should be approachable for percutaneous embolization.  2. Stable mild splenomegaly.  3. 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  Based on my review of the CTA, pt has a distal SAA with significant calcification.  There is substantial aortic tortuosity and there appears to be possible two branches off the aneurysm, though one disappears on the recon  Medical Decision  Making  [name] is a 80 y.o. female  who presents with: asymptomatic large SAA    I recommend splenic angiogram and embolization of the SAA as the SAA > 2.0 cm due its risk for bleeding. I discussed with the patient the nature of angiographic procedures, especially the limited patencies of any endovascular intervention.  The patient is aware of that the risks of an angiographic procedure include but are not limited to: bleeding, infection, access site complications, renal failure, embolization, rupture of vessel, dissection, possible need for emergent surgical intervention, possible need for surgical procedures to treat the patient's pathology, anaphylactic reaction to contrast, and stroke and death.   Additionally, the patient is aware of the risk of splenic infarction and subsequent complications including splenic abscess which might subsequently require splenectomy.   The patient is aware of the risks and agrees to proceed. Her son is in the hospital due to injury and will be undergoing surgery.  The patient wants to wait until he recovers to proceed. The patient has been educated on what sx to expect if the aneurysm begins bleeding.  She will report immediately to the ER in that case.  Thank you for allowing [place] to participate in this patient's care.  Leonides Sake, MD [facility] [place] Office: [phone] Pager: [phone]  12/31/2013, 1:32 PM

## 2014-01-13 DIAGNOSIS — N390 Urinary tract infection, site not specified: Secondary | ICD-10-CM | POA: Diagnosis not present

## 2014-01-18 ENCOUNTER — Other Ambulatory Visit: Payer: Self-pay | Admitting: *Deleted

## 2014-01-22 ENCOUNTER — Encounter (HOSPITAL_COMMUNITY): Payer: Self-pay | Admitting: Pharmacy Technician

## 2014-01-28 ENCOUNTER — Encounter (HOSPITAL_COMMUNITY)
Admission: RE | Disposition: A | Discharge status: Home / Self Care | Payer: Self-pay | Source: Ambulatory Visit | Attending: Vascular Surgery

## 2014-01-28 ENCOUNTER — Observation Stay (HOSPITAL_COMMUNITY)
Admission: RE | Admit: 2014-01-28 | Discharge: 2014-01-29 | Disposition: A | Discharge status: Home / Self Care | Payer: Medicare Other | Source: Ambulatory Visit | Attending: Vascular Surgery | Admitting: Vascular Surgery

## 2014-01-28 ENCOUNTER — Encounter (HOSPITAL_COMMUNITY): Payer: Self-pay | Admitting: General Practice

## 2014-01-28 DIAGNOSIS — I728 Aneurysm of other specified arteries: Principal | ICD-10-CM | POA: Insufficient documentation

## 2014-01-28 HISTORY — DX: Urinary tract infection, site not specified: N39.0

## 2014-01-28 HISTORY — DX: Anemia, unspecified: D64.9

## 2014-01-28 HISTORY — PX: SPLENIC ARTERY EMBOLIZATION: SHX2430

## 2014-01-28 HISTORY — DX: Personal history of other medical treatment: Z92.89

## 2014-01-28 HISTORY — DX: Pure hypercholesterolemia, unspecified: E78.00

## 2014-01-28 HISTORY — DX: Hypothyroidism, unspecified: E03.9

## 2014-01-28 HISTORY — PX: EMBOLIZATION: SHX5507

## 2014-01-28 HISTORY — DX: Pneumonia, unspecified organism: J18.9

## 2014-01-28 HISTORY — DX: Cerebral infarction, unspecified: I63.9

## 2014-01-28 HISTORY — PX: VISCERAL ANGIOGRAM: SHX5515

## 2014-01-28 HISTORY — DX: Unspecified osteoarthritis, unspecified site: M19.90

## 2014-01-28 LAB — CREATININE, SERUM
CREATININE: 0.75 mg/dL (ref 0.50–1.10)
GFR calc non Af Amer: 80 mL/min — ABNORMAL LOW (ref >90)

## 2014-01-28 LAB — POCT I-STAT, CHEM 8
BUN: 26 mg/dL — AB (ref 6–23)
Calcium, Ion: 1.26 mmol/L (ref 1.13–1.30)
Chloride: 106 mEq/L (ref 96–112)
Creatinine, Ser: 0.9 mg/dL (ref 0.50–1.10)
Glucose, Bld: 101 mg/dL — ABNORMAL HIGH (ref 70–99)
HEMATOCRIT: 35 % — AB (ref 36.0–46.0)
Hemoglobin: 11.9 g/dL — ABNORMAL LOW (ref 12.0–15.0)
POTASSIUM: 4.2 meq/L (ref 3.7–5.3)
Sodium: 141 mEq/L (ref 137–147)
TCO2: 20 mmol/L (ref 0–100)

## 2014-01-28 LAB — CBC
HCT: 33.3 % — ABNORMAL LOW (ref 36.0–46.0)
Hemoglobin: 11.8 g/dL — ABNORMAL LOW (ref 12.0–15.0)
MCH: 32.3 pg (ref 26.0–34.0)
MCHC: 35.4 g/dL (ref 30.0–36.0)
MCV: 91.2 fL (ref 78.0–100.0)
Platelets: 104 10*3/uL — ABNORMAL LOW (ref 150–400)
RBC: 3.65 MIL/uL — AB (ref 3.87–5.11)
RDW: 13 % (ref 11.5–15.5)
WBC: 4.4 10*3/uL (ref 4.0–10.5)

## 2014-01-28 LAB — POCT ACTIVATED CLOTTING TIME
ACTIVATED CLOTTING TIME: 182 s
Activated Clotting Time: 171 seconds

## 2014-01-28 SURGERY — VISCERAL ANGIOGRAM
Anesthesia: LOCAL

## 2014-01-28 MED ORDER — FENTANYL CITRATE 0.05 MG/ML IJ SOLN
INTRAMUSCULAR | Status: AC
  Filled 2014-01-28: qty 2

## 2014-01-28 MED ORDER — HEPARIN SODIUM (PORCINE) 1000 UNIT/ML IJ SOLN
INTRAMUSCULAR | Status: AC
  Filled 2014-01-28: qty 1

## 2014-01-28 MED ORDER — OXYCODONE-ACETAMINOPHEN 5-325 MG PO TABS: 1.0000 | ORAL_TABLET | ORAL | Status: DC | PRN

## 2014-01-28 MED ORDER — ACETAMINOPHEN 325 MG PO TABS: 325.0000 mg | ORAL_TABLET | ORAL | Status: DC | PRN

## 2014-01-28 MED ORDER — PANTOPRAZOLE SODIUM 40 MG PO TBEC: 40.0000 mg | DELAYED_RELEASE_TABLET | Freq: Every day | ORAL | Status: DC

## 2014-01-28 MED ORDER — ZOLPIDEM TARTRATE 5 MG PO TABS: 5.0000 mg | ORAL_TABLET | Freq: Every evening | ORAL | Status: DC | PRN

## 2014-01-28 MED ORDER — SODIUM CHLORIDE 0.9 % IV SOLN: 1.0000 mL/kg/h | INTRAVENOUS | Status: DC

## 2014-01-28 MED ORDER — BETHANECHOL CHLORIDE 25 MG PO TABS
50.0000 mg | ORAL_TABLET | Freq: Two times a day (BID) | ORAL | Status: DC
  Administered 2014-01-28 – 2014-01-29 (×2): 50 mg via ORAL
  Filled 2014-01-28 (×4): qty 2

## 2014-01-28 MED ORDER — NITROFURANTOIN MACROCRYSTAL 100 MG PO CAPS
100.0000 mg | ORAL_CAPSULE | Freq: Every day | ORAL | Status: DC
  Administered 2014-01-28: 100 mg via ORAL
  Filled 2014-01-28 (×2): qty 1

## 2014-01-28 MED ORDER — LEVOTHYROXINE SODIUM 50 MCG PO TABS
50.0000 ug | ORAL_TABLET | Freq: Every day | ORAL | Status: DC
  Administered 2014-01-29: 50 ug via ORAL
  Filled 2014-01-28 (×2): qty 1

## 2014-01-28 MED ORDER — MORPHINE SULFATE 2 MG/ML IJ SOLN: 2.0000 mg | INTRAMUSCULAR | Status: DC | PRN

## 2014-01-28 MED ORDER — ONDANSETRON HCL 4 MG/2ML IJ SOLN: 4.0000 mg | Freq: Four times a day (QID) | INTRAMUSCULAR | Status: DC | PRN

## 2014-01-28 MED ORDER — CLOPIDOGREL BISULFATE 75 MG PO TABS
75.0000 mg | ORAL_TABLET | Freq: Every day | ORAL | Status: DC
  Administered 2014-01-28: 75 mg via ORAL
  Filled 2014-01-28: qty 1

## 2014-01-28 MED ORDER — MIDAZOLAM HCL 2 MG/2ML IJ SOLN
INTRAMUSCULAR | Status: AC
  Filled 2014-01-28: qty 2

## 2014-01-28 MED ORDER — EZETIMIBE 10 MG PO TABS
10.0000 mg | ORAL_TABLET | Freq: Every day | ORAL | Status: DC
  Administered 2014-01-28: 10 mg via ORAL
  Filled 2014-01-28 (×2): qty 1

## 2014-01-28 MED ORDER — LABETALOL HCL 5 MG/ML IV SOLN: 10.0000 mg | INTRAVENOUS | Status: DC | PRN

## 2014-01-28 MED ORDER — DOCUSATE SODIUM 100 MG PO CAPS
100.0000 mg | ORAL_CAPSULE | Freq: Two times a day (BID) | ORAL | Status: DC
  Administered 2014-01-28: 100 mg via ORAL
  Filled 2014-01-28 (×3): qty 1

## 2014-01-28 MED ORDER — ACETAMINOPHEN 650 MG RE SUPP: 325.0000 mg | RECTAL | Status: DC | PRN

## 2014-01-28 MED ORDER — LIDOCAINE HCL (PF) 1 % IJ SOLN
INTRAMUSCULAR | Status: AC
  Filled 2014-01-28: qty 30

## 2014-01-28 MED ORDER — ONDANSETRON HCL 4 MG/2ML IJ SOLN
INTRAMUSCULAR | Status: AC
  Filled 2014-01-28: qty 2

## 2014-01-28 MED ORDER — OXYCODONE HCL 5 MG PO TABS: 5.0000 mg | ORAL_TABLET | ORAL | Status: DC | PRN

## 2014-01-28 MED ORDER — TAMSULOSIN HCL 0.4 MG PO CAPS
0.4000 mg | ORAL_CAPSULE | Freq: Two times a day (BID) | ORAL | Status: DC
  Administered 2014-01-28 – 2014-01-29 (×2): 0.4 mg via ORAL
  Filled 2014-01-28 (×3): qty 1

## 2014-01-28 MED ORDER — GUAIFENESIN-DM 100-10 MG/5ML PO SYRP
15.0000 mL | ORAL_SOLUTION | ORAL | Status: DC | PRN
  Filled 2014-01-28: qty 15

## 2014-01-28 MED ORDER — POTASSIUM CHLORIDE CRYS ER 20 MEQ PO TBCR: 20.0000 meq | EXTENDED_RELEASE_TABLET | Freq: Once | ORAL | Status: DC

## 2014-01-28 MED ORDER — PHENOL 1.4 % MT LIQD
1.0000 | OROMUCOSAL | Status: DC | PRN
  Filled 2014-01-28: qty 177

## 2014-01-28 MED ORDER — HYDRALAZINE HCL 20 MG/ML IJ SOLN
INTRAMUSCULAR | Status: AC
  Filled 2014-01-28: qty 1

## 2014-01-28 MED ORDER — HEPARIN (PORCINE) IN NACL 2-0.9 UNIT/ML-% IJ SOLN
INTRAMUSCULAR | Status: AC
  Filled 2014-01-28: qty 1000

## 2014-01-28 MED ORDER — LABETALOL HCL 300 MG PO TABS
300.0000 mg | ORAL_TABLET | Freq: Two times a day (BID) | ORAL | Status: DC
  Administered 2014-01-28: 300 mg via ORAL
  Administered 2014-01-29: 150 mg via ORAL
  Filled 2014-01-28 (×3): qty 1

## 2014-01-28 MED ORDER — ENOXAPARIN SODIUM 40 MG/0.4ML ~~LOC~~ SOLN
40.0000 mg | SUBCUTANEOUS | Status: DC
  Administered 2014-01-28: 40 mg via SUBCUTANEOUS
  Filled 2014-01-28 (×2): qty 0.4

## 2014-01-28 MED ORDER — METOPROLOL TARTRATE 1 MG/ML IV SOLN: 2.0000 mg | INTRAVENOUS | Status: DC | PRN

## 2014-01-28 MED ORDER — SODIUM CHLORIDE 0.9 % IV SOLN
INTRAVENOUS | Status: DC
  Administered 2014-01-28: 08:00:00 via INTRAVENOUS

## 2014-01-28 MED ORDER — LISINOPRIL 40 MG PO TABS
40.0000 mg | ORAL_TABLET | Freq: Every day | ORAL | Status: DC
  Filled 2014-01-28: qty 1

## 2014-01-28 MED ORDER — HYDRALAZINE HCL 20 MG/ML IJ SOLN: 10.0000 mg | INTRAMUSCULAR | Status: DC | PRN

## 2014-01-28 MED ORDER — ALUM & MAG HYDROXIDE-SIMETH 200-200-20 MG/5ML PO SUSP: 15.0000 mL | ORAL | Status: DC | PRN

## 2014-01-28 NOTE — Interval H&P Note (Signed)
[facility] [place]  History and Physical Update  The patient was interviewed and re-examined.  The patient's previous History and Physical has been reviewed and is unchanged from my consult.  There is no change in the plan of care: Left celiac angiogram, likely splenic artery aneurysm embolization.  I discussed with the patient the nature of angiographic procedures, especially the limited patencies of any endovascular intervention.  The patient is aware of that the risks of an angiographic procedure include but are not limited to: bleeding, infection, access site complications, renal failure, embolization, rupture of vessel, dissection, possible need for emergent surgical intervention, possible need for surgical procedures to treat the patient's pathology, anaphylactic reaction to contrast, and stroke and death.  Additionally, possibility of splenic infarction and consequences of such were discussed with the patient.  The patient is aware of the risks and agrees to proceed.   Adele Barthel, MD [facility] [place] Office: [phone] Pager: [phone]  01/28/2014, 7:47 AM

## 2014-01-28 NOTE — Progress Notes (Signed)
Report called to Sarah,R.N. On 3W.  Pt transferred to 3W35 without incidence.

## 2014-01-28 NOTE — H&P (View-Only) (Signed)
Established Splenic Artery Aneurysm  History of Present Illness  The patient is a 80 y.o. ([name], [name]) female who presents with chief complaint: follow up for SAA.  Previous studies demonstrate an SAA, measuring 3.1 cm.  The patient does not have back or abdominal pain.  The patient is not a smoker.  She returns today from her CTA Abd/pelvis.  The pt notes having gotten: H. Influenza, Pneumococcal, and meningococcal Vac.  The patient's PMH, PSH, SH, FamHx, Med, and Allergies are unchanged from 12/04/13.  On ROS today: no abd pain, no fever or chills  Physical Examination  Filed Vitals:   12/31/13 1308  BP: 161/99  Pulse: 67  Resp: 18  Height: 5\' 7"  (1.702 m)  Weight: 199 lb (90.266 kg)   Body mass index is 31.16 kg/(m^2).  General: A&O x 3, WD, mildly obese  Pulmonary: Sym exp, good air movt, CTAB, no rales, rhonchi, & wheezing  Cardiac: RRR, Nl S1, S2, no Murmurs, rubs or gallops  Vascular: Vessel Right Left  Radial Palpable Palpable  Brachial Palpable Palpable  Carotid Palpable, without bruit Palpable, without bruit  Aorta Not palpable N/A  Femoral Palpable Palpable  Popliteal Not palpable Not palpable  PT Palpable Palpable  DP Palpable Palpable   Gastrointestinal: soft, NTND, -G/R, - HSM, - masses, - CVAT B  Musculoskeletal: M/S 5/5 throughout , Extremities without ischemic changes   Neurologic: Pain and light touch intact in extremities , Motor exam as listed above  CTA Abd/Pelvis (12/31/2013)  1. Slight enlargement of 3.2 cm distal splenic artery aneurysm. This should be approachable for percutaneous embolization.  2. Stable mild splenomegaly.  3. 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  Based on my review of the CTA, pt has a distal SAA with significant calcification.  There is substantial aortic tortuosity and there appears to be possible two branches off the aneurysm, though one disappears on the recon  Medical Decision  Making  [name] is a 80 y.o. female  who presents with: asymptomatic large SAA    I recommend splenic angiogram and embolization of the SAA as the SAA > 2.0 cm due its risk for bleeding. I discussed with the patient the nature of angiographic procedures, especially the limited patencies of any endovascular intervention.  The patient is aware of that the risks of an angiographic procedure include but are not limited to: bleeding, infection, access site complications, renal failure, embolization, rupture of vessel, dissection, possible need for emergent surgical intervention, possible need for surgical procedures to treat the patient's pathology, anaphylactic reaction to contrast, and stroke and death.   Additionally, the patient is aware of the risk of splenic infarction and subsequent complications including splenic abscess which might subsequently require splenectomy.   The patient is aware of the risks and agrees to proceed. Her son is in the hospital due to injury and will be undergoing surgery.  The patient wants to wait until he recovers to proceed. The patient has been educated on what sx to expect if the aneurysm begins bleeding.  She will report immediately to the ER in that case.  Thank you for allowing [place] to participate in this patient's care.  Leonides Sake, MD [facility] [place] Office: [phone] Pager: [phone]  12/31/2013, 1:32 PM

## 2014-01-28 NOTE — Discharge Instructions (Signed)
Angiography, Care After °Refer to this sheet in the next few weeks. These instructions provide you with information on caring for yourself after your procedure. Your health care provider may also give you more specific instructions. Your treatment has been planned according to current medical practices, but problems sometimes occur. Call your health care provider if you have any problems or questions after your procedure.  °WHAT TO EXPECT AFTER THE PROCEDURE °After your procedure, it is typical to have the following sensations: °· Minor discomfort or tenderness and a small bump at the catheter insertion site. The bump should usually decrease in size and tenderness within 1 to 2 weeks. °· Any bruising will usually fade within 2 to 4 weeks. °HOME CARE INSTRUCTIONS  °· You may need to keep taking blood thinners if they were prescribed for you. Only take over-the-counter or prescription medicines for pain, fever, or discomfort as directed by your health care provider. °· Do not apply powder or lotion to the site. °· Do not sit in a bathtub, swimming pool, or whirlpool for 5 to 7 days. °· You may shower 24 hours after the procedure. Remove the bandage (dressing) and gently wash the site with plain soap and water. Gently pat the site dry. °· Inspect the site at least twice daily. °· Limit your activity for the first 48 hours. Do not bend, squat, or lift anything over 20 lb (9 kg) or as directed by your health care provider. °· Do not drive home if you are discharged the day of the procedure. Have someone else drive you. Follow instructions about when you can drive or return to work. °SEEK MEDICAL CARE IF: °· You get lightheaded when standing up. °· You have drainage (other than a small amount of blood on the dressing). °· You have chills. °· You have a fever. °· You have redness, warmth, swelling, or pain at the insertion site. °SEEK IMMEDIATE MEDICAL CARE IF:  °· You develop chest pain or shortness of breath, feel faint,  or pass out. °· You have bleeding, swelling larger than a walnut, or drainage from the catheter insertion site. °· You develop pain, discoloration, coldness, or severe bruising in the leg or arm that held the catheter. °· You develop bleeding from any other place, such as the bowels. You may see bright red blood in your urine or stools, or your stools may appear black and tarry. °· You have heavy bleeding from the site. If this happens, hold pressure on the site. °MAKE SURE YOU: °· Understand these instructions. °· Will watch your condition. °· Will get help right away if you are not doing well or get worse. °Document Released: 03/15/2005 Document Revised: 04/29/2013 Document Reviewed: 01/19/2013 °[employer]® Patient Information ©2014 [employer]. ° °

## 2014-01-28 NOTE — Progress Notes (Signed)
Dr. Bridgett Larsson notified that pt does not have anyone to stay with her tonight.  Pt will be admitted for obs

## 2014-01-28 NOTE — Op Note (Addendum)
OPERATIVE NOTE   PROCEDURE: 1.  Right common femoral artery cannulation under ultrasound guidance 2.  Placement of catheter in aorta 3.  Aortogram 4.  Selection of splenic artery 5.  Splenic artery angiogram 6.  Embolization of splenic artery x 10 coils (Azur CX 8 mm x 28 cm, CX 6 mm x 20 cm, CX 6 mm x 20 cm, Framing 14 mm x 34 cm, Framing 14 mm x 34 cm, Azur 20 mm x 20 cm, Azur 20 mm x 20 mm, CX 10 mm x 32 cm, CX 9 mm x 28 mm, CX 8 mm x 28 cm)  PRE-OPERATIVE DIAGNOSIS: large asymptomatic splenic artery aneurysm  POST-OPERATIVE DIAGNOSIS: same as above   SURGEON: Adele Barthel, MD  ANESTHESIA: conscious sedation  ESTIMATED BLOOD LOSS: 50 cc  CONTRAST: 70 cc  FINDING(S):  Aorta: patent  Superior mesenteric artery: patent Celiac artery: patent, orifical calcification, large splenic artery aneurysm; decreased flow to aneurysm with appearance of new collaterals consistent with successful embolization Bilateral renal arteries: patent   SPECIMEN(S):  none  INDICATIONS:   [name] is a 80 y.o. female who presents with a large asymptomatic splenic artery aneurysm.  The patient presents for: splenic artery angiogram, likely embolization.  I discussed with the patient the nature of angiographic procedures, especially the limited patencies of any endovascular intervention.  The patient is aware of that the risks of an angiographic procedure include but are not limited to: bleeding, infection, access site complications, renal failure, embolization, rupture of vessel, dissection, possible need for emergent surgical intervention, possible need for surgical procedures to treat the patient's pathology, and stroke and death.  The patient is also aware of the risk of splenic artery infarction from the embolization and the consequence of such.  The patient is aware of the risks and agrees to proceed.  DESCRIPTION: After full informed consent was obtained from the patient, the patient was  brought back to the angiography suite.  The patient was placed supine upon the angiography table and connected to monitoring equipment.  The patient was then given conscious sedation, the amounts of which are documented in the patient's chart.  The patient was prepped and draped in the standard fashion for an angiographic procedure.  At this point, attention was turned to the right groin.  Under ultrasound guidance, the right common femoral artery was cannulated with a micropuncture needle.  The microwire was advanced into the iliac arterial system.  The needle was exchanged for a microsheath, which was loaded into the common femoral artery over the wire.  The microwire was exchanged for a [facility] wire which was advanced into the aorta.  The microsheath was then exchanged for a 5-Fr sheath which was loaded into the common femoral artery.  The Omniflush catheter was then loaded over the wire up to the level of T11.  The catheter was connected to the power injector circuit.  After de-airring and de-clotting the circuit, a power injector aortogram was completed.  A lateral aortogram was then completed.  The splenic artery aneurysm was obvious as was the severe tortuosity in this patient's celiac artery system. I exchanged the right femoral sheath for a 6-Fr sheath.  I placed a 6-Fr IMA guide adjacent to the celiac artery.  The guide would not engage the celiac orifice, so I placed a SOS catheter over the wire and through the guide.  Using the SOS catheter and [facility] wire, I selected the celiac artery,  I engaged the SOS into  the celiac artery and did a hand injection to image the celiac artery.  I tried to advance the guide but the catheter and wire kicked out with the guide.  I repeated the process to select the celiac artery.  I then exchanged the wire for a Versacore wire, which I was able to advanced into the splenic artery aneurysm.  I exchanged the catheter for a Glidecath which I advanced into the mid-splenic  artery.  I was then able to advance the guide into the celiac artery orifice.  When aspirating the guide, I was obtaining some clot at this point, so I elected to heparinize the patient.  I gave 7000 units of Heparin which was a therapeutic bolus.    Due to the tortuosity in the celiac and splenic artery, I could not advance the glidecath past the aneurysm.  Subsequently, I planned on embolizing the sac itself.  I obtained and prepared a Azur Framing Coil 20 mm x 20 cm.  There was significant amount of resistance with advancing this coil.  At one point, the coil detached from its shaft despite not being intentionally released.  The coil appeared to be still in the [place], so I removed the [place], which pulled the entire coil out.  I re-imaged the entire abdomen and pelvis to verify none of the coil was left behind.  I tried to readvance a new Glidecath with Versacore wire but the entire guide kicked out with the catheter and wire.  I once again reselected the celiac artery with a SOS catheter and Versacore wire.  I re-engaged the IMA guide.  I used a new Glidecath and was able to easily get into the splenic artery aneurysm.  I placed microcatheter system through the [facility] and was able to roll off the calcified splenic artery wall into the dominant outflow artery.  I advanced the microcatheter into the dominant outflow artery.  I proceeded to deploy the coils listed above into the dominant outflow artery, the splenic artery aneurysm sac, and the inflow artery, pulling back the microcatheter sequentially to direct the deployment of the various coils.  Framing coils and CX coils were deployed as a scaffold for the Azur hydrogel coils.  I tested the outflow artery before pulling back and no further lumen could be accessed upon probing with the microcatheter and microwire system.  In a similar fashion, I tested the inflow artery and could not advanced the microcatheter and microwire system past the  embolized segment.  I did hand injections which demonstrated collateral filling of new arterioles and increased resistance to the hand injections.  At this point, I felt that no further coils needed to be placed.  I expect the aneurysm to fully thrombose over the next 6 hours once the heparin is fully reversed.    I pulled out the microsystem and the Glidecath.  I then replaced a Benson wire in the guide and pulled back the guide and wire out the right femoral sheath.  The sheath was aspirated.  No clots were present and the sheath was reloaded with heparinized saline.  The sheath will be pulled in the holding area.    COMPLICATIONS: none  CONDITION: stable  Adele Barthel, MD [facility] [place] Office: [phone] Pager: [phone]  01/28/2014, 1:24 PM

## 2014-01-29 ENCOUNTER — Telehealth: Payer: Self-pay | Admitting: Vascular Surgery

## 2014-01-29 LAB — CBC
HCT: 28.8 % — ABNORMAL LOW (ref 36.0–46.0)
Hemoglobin: 10 g/dL — ABNORMAL LOW (ref 12.0–15.0)
MCH: 32.2 pg (ref 26.0–34.0)
MCHC: 34.7 g/dL (ref 30.0–36.0)
MCV: 92.6 fL (ref 78.0–100.0)
Platelets: 81 10*3/uL — ABNORMAL LOW (ref 150–400)
RBC: 3.11 MIL/uL — ABNORMAL LOW (ref 3.87–5.11)
RDW: 13.1 % (ref 11.5–15.5)
WBC: 4 10*3/uL (ref 4.0–10.5)

## 2014-01-29 NOTE — Progress Notes (Signed)
Pt called to inform me of increasing pain and discomfort in right groin. On assessment right thigh was swollen with a palpable medium sized hematoma. Pressure was applied and Bridgett Larsson, MD was notified. Orders given to apply pressure for ten minutes, Q2 BP's, and CBC in AM. Vitals WNL at this time. Pain and discomfort resolved after applying pressure. Patient instructed to keep leg still in bed for a few more hours. Will continue to monitor pt.   Prescilla Sours, Therapist, sports

## 2014-01-29 NOTE — Telephone Encounter (Addendum)
Message copied by Gena Fray on Fri Jan 29, 2014  9:26 AM ------      Message from: Peter Minium K      Created: Thu Jan 28, 2014  5:05 PM      Regarding: Schedule                   ----- Message -----         From: Conrad Wibaux, MD         Sent: 01/28/2014   2:25 PM           To: [facility] Charge Pool            [name]      [id number]      [birth year]            PROCEDURE:      1.  Right common femoral artery cannulation under ultrasound guidance      2.  Placement of catheter in aorta      3.  Aortogram      4.  Selection of splenic artery      5.  Splenic artery angiogram      6.  Embolization of splenic artery x 10 coils (Azur CX 8 mm x 28 cm, CX 6 mm x 20 cm, CX 6 mm x 20 cm, Framing 14 mm x 34 cm, Framing 14 mm x 34 cm, Azur 20 mm x 20 cm, Azur 20 mm x 20 mm, CX 10 mm x 32 cm, CX 9 mm x 28 mm, CX 8 mm x 28 cm)            Follow-up: 4 weeks ------  01/29/14: lm for pt with appt info, dpm

## 2014-01-29 NOTE — Progress Notes (Addendum)
   Daily Progress Note  Assessment/Planning: POD #1 s/p SAA embolization   Small hematoma post-op  Pt completely asx  H/H drop acceptable and consistent with small post-op bleed  D/C home  Subjective  - 1 Day Post-Op  No complaints, no abd pain  Objective Filed Vitals:   01/28/14 2200 01/29/14 0000 01/29/14 0200 01/29/14 0400  BP: 118/70 124/65 120/62 117/60  Pulse:    64  Temp:    97.9 F (36.6 C)  TempSrc:    Oral  Resp:    16  Height:      Weight:      SpO2:    97%    Intake/Output Summary (Last 24 hours) at 01/29/14 0720 Last data filed at 01/28/14 1942  Gross per 24 hour  Intake 1324.61 ml  Output      0 ml  Net 1324.61 ml    PULM  CTAB CV  RRR GI  soft, NTND, no LUQ TTP VASC  R groin: small amount of echymosis, minimal hematoma  Laboratory CBC    Component Value Date/Time   WBC 4.0 01/29/2014 0500   HGB 10.0* 01/29/2014 0500   HCT 28.8* 01/29/2014 0500   PLT 81* 01/29/2014 0500    BMET    Component Value Date/Time   NA 141 01/28/2014 0834   K 4.2 01/28/2014 0834   CL 106 01/28/2014 0834   CO2 23 04/05/2010 0445   GLUCOSE 101* 01/28/2014 0834   BUN 26* 01/28/2014 0834   CREATININE 0.75 01/28/2014 1925   CALCIUM 8.9 04/05/2010 0445   GFRNONAA 80* 01/28/2014 1925   GFRAA >90 01/28/2014 1925    Adele Barthel, MD [facility] [place] Office: [phone] Pager: [phone]  01/29/2014, 7:20 AM

## 2014-01-29 NOTE — Discharge Summary (Signed)
Physician Discharge Summary  Patient ID: [name] MRN: [id number] DOB/AGE: 1934-05-18 80 y.o.  Admit date: 01/28/2014 Discharge date: 01/29/2014  Admission Diagnosis: Splenic artery aneurysm  Discharge Diagnoses:  Splenic artery aneurysm  Secondary Diagnoses: Past Medical History  Diagnosis Date  . Hypertension   . High cholesterol   . Stroke 1990's    "mild", denies residual on 01/28/2014  . Pneumonia     "maybe in my teens"  . Hypothyroidism   . Anemia   . History of blood transfusion     "S/P knee OR"  . Arthritis     "was in my knees"  . Recurrent UTI (urinary tract infection)     Procedures: 1.  Right common femoral artery cannulation under ultrasound guidance 2.  Placement of catheter in aorta 3.  Aortogram 4.  Selection of splenic artery 5.  Splenic artery angiogram 6.  Embolization of splenic artery with 10 coils  Discharged Condition: good  Hospital Course: [name] is a 80 y.o. female who underwent a splenic artery embolization.  She was admitted overnight for observation as she did not have anyone to observe her at home.  She developed a small hematoma post-operatively but has been hemodynamically stable and asymptomatic.  She currently denies any Left upper quadrant pain.  Significant Diagnostic Studies: CBC    Component Value Date/Time   WBC 4.0 01/29/2014 0500   RBC 3.11* 01/29/2014 0500   HGB 10.0* 01/29/2014 0500   HCT 28.8* 01/29/2014 0500   PLT 81* 01/29/2014 0500   MCV 92.6 01/29/2014 0500   MCH 32.2 01/29/2014 0500   MCHC 34.7 01/29/2014 0500   RDW 13.1 01/29/2014 0500   LYMPHSABS 1.5 03/30/2010 1055   MONOABS 0.5 03/30/2010 1055   EOSABS 0.1 03/30/2010 1055   BASOSABS 0.0 03/30/2010 1055    BMET    Component Value Date/Time   NA 141 01/28/2014 0834   K 4.2 01/28/2014 0834   CL 106 01/28/2014 0834   CO2 23 04/05/2010 0445   GLUCOSE 101* 01/28/2014 0834   BUN 26* 01/28/2014 0834   CREATININE 0.75 01/28/2014 1925   CALCIUM 8.9  04/05/2010 0445   GFRNONAA 80* 01/28/2014 1925   GFRAA >90 01/28/2014 1925    COAG Lab Results  Component Value Date   INR 1.29 03/30/2010   INR 1.24 02/15/2010   No results found for this basename: PTT    Disposition:   Discharge Instructions   Activity as tolerated - No restrictions    Complete by:  As directed      Call MD for:  redness, tenderness, or signs of infection (pain, swelling, bleeding, redness, odor or green/yellow discharge around incision site)    Complete by:  As directed      Call MD for:  severe or increased pain, loss or decreased feeling  in affected limb(s)    Complete by:  As directed      Call MD for:  temperature >100.5    Complete by:  As directed      Driving Restrictions    Complete by:  As directed   No driving restrictions.     Lifting restrictions    Complete by:  As directed   No lifting for 4 weeks     Resume previous diet    Complete by:  As directed             Medication List         bethanechol 50 MG tablet  Commonly known as:  URECHOLINE  Take 50 mg by mouth 2 (two) times daily.     CALCIUM 500 + D PO  Take 500 mg by mouth 2 (two) times daily.     cholecalciferol 1000 UNITS tablet  Commonly known as:  VITAMIN D  Take 2,000 Units by mouth daily.     clopidogrel 75 MG tablet  Commonly known as:  PLAVIX  Take 75 mg by mouth at bedtime.     CRANBERRY PLUS VITAMIN C 140-100-3 MG-MG-UNIT Caps  Generic drug:  Cranberry-Vitamin C-Vitamin E  Take 2,000 Units by mouth 2 (two) times daily.     ezetimibe 10 MG tablet  Commonly known as:  ZETIA  Take 10 mg by mouth at bedtime.     Fish Oil 1000 MG Caps  Take 1,000 mg by mouth 2 (two) times daily.     folic acid 400 MCG tablet  Commonly known as:  FOLVITE  Take 400 mcg by mouth daily.     labetalol 200 MG tablet  Commonly known as:  NORMODYNE  Take 300 mg by mouth 2 (two) times daily. Take 1 1/2 tablets twice daily     levothyroxine 50 MCG tablet  Commonly known as:   SYNTHROID, LEVOTHROID  Take 50 mcg by mouth daily before breakfast.     lisinopril 40 MG tablet  Commonly known as:  PRINIVIL,ZESTRIL  Take 40 mg by mouth daily.     nitrofurantoin 100 MG capsule  Commonly known as:  MACRODANTIN  Take 100 mg by mouth at bedtime.     psyllium 58.6 % packet  Commonly known as:  METAMUCIL  Take 1 packet by mouth daily.     tamsulosin 0.4 MG Caps capsule  Commonly known as:  FLOMAX  Take 0.4 mg by mouth 2 (two) times daily.           Follow-up Information   Follow up with Nilda Simmer, MD In 4 weeks. (office will call you to arrange an appointment (sent))    Specialty:  Vascular Surgery   Contact information:   [address] [place] [address] [phone]       Signed: Fransisco Hertz 01/29/2014, 7:15 AM

## 2014-01-30 ENCOUNTER — Inpatient Hospital Stay (HOSPITAL_COMMUNITY): Payer: Medicare Other

## 2014-01-30 ENCOUNTER — Encounter (HOSPITAL_COMMUNITY): Payer: Self-pay | Admitting: *Deleted

## 2014-01-30 ENCOUNTER — Inpatient Hospital Stay (HOSPITAL_COMMUNITY)
Admission: EM | Admit: 2014-01-30 | Discharge: 2014-02-03 | DRG: 238 | Disposition: A | Discharge status: Home / Self Care | Payer: Medicare Other | Attending: Vascular Surgery | Admitting: Vascular Surgery

## 2014-01-30 DIAGNOSIS — E7800 Pure hypercholesterolemia, unspecified: Secondary | ICD-10-CM | POA: Diagnosis not present

## 2014-01-30 DIAGNOSIS — D649 Anemia, unspecified: Secondary | ICD-10-CM | POA: Diagnosis not present

## 2014-01-30 DIAGNOSIS — Z886 Allergy status to analgesic agent status: Secondary | ICD-10-CM | POA: Diagnosis not present

## 2014-01-30 DIAGNOSIS — I724 Aneurysm of artery of lower extremity: Secondary | ICD-10-CM | POA: Diagnosis present

## 2014-01-30 DIAGNOSIS — Z8673 Personal history of transient ischemic attack (TIA), and cerebral infarction without residual deficits: Secondary | ICD-10-CM

## 2014-01-30 DIAGNOSIS — Z79899 Other long term (current) drug therapy: Secondary | ICD-10-CM

## 2014-01-30 DIAGNOSIS — Z7902 Long term (current) use of antithrombotics/antiplatelets: Secondary | ICD-10-CM

## 2014-01-30 DIAGNOSIS — I10 Essential (primary) hypertension: Secondary | ICD-10-CM | POA: Diagnosis present

## 2014-01-30 DIAGNOSIS — E039 Hypothyroidism, unspecified: Secondary | ICD-10-CM | POA: Diagnosis not present

## 2014-01-30 DIAGNOSIS — Y838 Other surgical procedures as the cause of abnormal reaction of the patient, or of later complication, without mention of misadventure at the time of the procedure: Secondary | ICD-10-CM | POA: Diagnosis present

## 2014-01-30 DIAGNOSIS — I999 Unspecified disorder of circulatory system: Principal | ICD-10-CM | POA: Diagnosis present

## 2014-01-30 DIAGNOSIS — T81718A Complication of other artery following a procedure, not elsewhere classified, initial encounter: Secondary | ICD-10-CM

## 2014-01-30 DIAGNOSIS — I728 Aneurysm of other specified arteries: Secondary | ICD-10-CM | POA: Diagnosis not present

## 2014-01-30 DIAGNOSIS — Z96659 Presence of unspecified artificial knee joint: Secondary | ICD-10-CM | POA: Diagnosis not present

## 2014-01-30 DIAGNOSIS — I729 Aneurysm of unspecified site: Secondary | ICD-10-CM

## 2014-01-30 DIAGNOSIS — Z01818 Encounter for other preprocedural examination: Secondary | ICD-10-CM | POA: Diagnosis not present

## 2014-01-30 LAB — COMPREHENSIVE METABOLIC PANEL
ALBUMIN: 3.7 g/dL (ref 3.5–5.2)
ALBUMIN: 3.3 g/dL — AB (ref 3.5–5.2)
ALT: 22 U/L (ref 0–35)
ALT: 20 U/L (ref 0–35)
AST: 27 U/L (ref 0–37)
AST: 24 U/L (ref 0–37)
Alkaline Phosphatase: 64 U/L (ref 39–117)
Alkaline Phosphatase: 62 U/L (ref 39–117)
BILIRUBIN TOTAL: 1.3 mg/dL — AB (ref 0.3–1.2)
BUN: 19 mg/dL (ref 6–23)
BUN: 17 mg/dL (ref 6–23)
CALCIUM: 9.1 mg/dL (ref 8.4–10.5)
CHLORIDE: 105 meq/L (ref 96–112)
CO2: 23 mEq/L (ref 19–32)
CO2: 20 meq/L (ref 19–32)
CREATININE: 0.88 mg/dL (ref 0.50–1.10)
Calcium: 8.8 mg/dL (ref 8.4–10.5)
Chloride: 105 mEq/L (ref 96–112)
Creatinine, Ser: 0.74 mg/dL (ref 0.50–1.10)
GFR calc Af Amer: 72 mL/min — ABNORMAL LOW (ref >90)
GFR calc Af Amer: >90 mL/min (ref >90)
GFR calc non Af Amer: 62 mL/min — ABNORMAL LOW (ref >90)
GFR, EST NON AFRICAN AMERICAN: 80 mL/min — AB (ref >90)
Glucose, Bld: 164 mg/dL — ABNORMAL HIGH (ref 70–99)
Glucose, Bld: 99 mg/dL (ref 70–99)
Potassium: 4.3 mEq/L (ref 3.7–5.3)
Potassium: 4 mEq/L (ref 3.7–5.3)
SODIUM: 140 meq/L (ref 137–147)
SODIUM: 138 meq/L (ref 137–147)
Total Bilirubin: 1.7 mg/dL — ABNORMAL HIGH (ref 0.3–1.2)
Total Protein: 6.3 g/dL (ref 6.0–8.3)
Total Protein: 5.9 g/dL — ABNORMAL LOW (ref 6.0–8.3)

## 2014-01-30 LAB — CBC WITH DIFFERENTIAL/PLATELET
BASOS PCT: 1 % (ref 0–1)
Basophils Absolute: 0 10*3/uL (ref 0.0–0.1)
EOS PCT: 2 % (ref 0–5)
Eosinophils Absolute: 0.1 10*3/uL (ref 0.0–0.7)
HEMATOCRIT: 28 % — AB (ref 36.0–46.0)
Hemoglobin: 9.9 g/dL — ABNORMAL LOW (ref 12.0–15.0)
LYMPHS PCT: 15 % (ref 12–46)
Lymphs Abs: 0.9 10*3/uL (ref 0.7–4.0)
MCH: 32.6 pg (ref 26.0–34.0)
MCHC: 35.4 g/dL (ref 30.0–36.0)
MCV: 92.1 fL (ref 78.0–100.0)
MONO ABS: 0.8 10*3/uL (ref 0.1–1.0)
Monocytes Relative: 13 % — ABNORMAL HIGH (ref 3–12)
NEUTROS ABS: 4 10*3/uL (ref 1.7–7.7)
Neutrophils Relative %: 69 % (ref 43–77)
PLATELETS: 95 10*3/uL — AB (ref 150–400)
RBC: 3.04 MIL/uL — ABNORMAL LOW (ref 3.87–5.11)
RDW: 13.2 % (ref 11.5–15.5)
WBC: 5.7 10*3/uL (ref 4.0–10.5)

## 2014-01-30 LAB — PROTIME-INR
INR: 1.43 (ref 0.00–1.49)
Prothrombin Time: 17.1 seconds — ABNORMAL HIGH (ref 11.6–15.2)

## 2014-01-30 LAB — CBC
HCT: 26.6 % — ABNORMAL LOW (ref 36.0–46.0)
Hemoglobin: 9.3 g/dL — ABNORMAL LOW (ref 12.0–15.0)
MCH: 32.7 pg (ref 26.0–34.0)
MCHC: 35 g/dL (ref 30.0–36.0)
MCV: 93.7 fL (ref 78.0–100.0)
PLATELETS: 75 10*3/uL — AB (ref 150–400)
RBC: 2.84 MIL/uL — AB (ref 3.87–5.11)
RDW: 13.3 % (ref 11.5–15.5)
WBC: 4 10*3/uL (ref 4.0–10.5)

## 2014-01-30 LAB — URINE MICROSCOPIC-ADD ON

## 2014-01-30 LAB — URINALYSIS, ROUTINE W REFLEX MICROSCOPIC
BILIRUBIN URINE: NEGATIVE
GLUCOSE, UA: NEGATIVE mg/dL
Hgb urine dipstick: NEGATIVE
KETONES UR: NEGATIVE mg/dL
NITRITE: POSITIVE — AB
PH: 6 (ref 5.0–8.0)
Protein, ur: NEGATIVE mg/dL
Specific Gravity, Urine: 1.014 (ref 1.005–1.030)
Urobilinogen, UA: 1 mg/dL (ref 0.0–1.0)

## 2014-01-30 LAB — SURGICAL PCR SCREEN
MRSA, PCR: NEGATIVE
Staphylococcus aureus: NEGATIVE

## 2014-01-30 MED ORDER — CLOPIDOGREL BISULFATE 75 MG PO TABS
75.0000 mg | ORAL_TABLET | Freq: Every day | ORAL | Status: DC
  Administered 2014-01-30 – 2014-02-02 (×4): 75 mg via ORAL
  Filled 2014-01-30 (×5): qty 1

## 2014-01-30 MED ORDER — GUAIFENESIN-DM 100-10 MG/5ML PO SYRP: 15.0000 mL | ORAL_SOLUTION | ORAL | Status: DC | PRN

## 2014-01-30 MED ORDER — LISINOPRIL 40 MG PO TABS
40.0000 mg | ORAL_TABLET | Freq: Every day | ORAL | Status: DC
  Administered 2014-01-30 – 2014-02-03 (×4): 40 mg via ORAL
  Filled 2014-01-30 (×5): qty 1

## 2014-01-30 MED ORDER — TAMSULOSIN HCL 0.4 MG PO CAPS
0.4000 mg | ORAL_CAPSULE | Freq: Two times a day (BID) | ORAL | Status: DC
  Administered 2014-01-30 – 2014-02-03 (×8): 0.4 mg via ORAL
  Filled 2014-01-30 (×9): qty 1

## 2014-01-30 MED ORDER — LABETALOL HCL 5 MG/ML IV SOLN
10.0000 mg | INTRAVENOUS | Status: DC | PRN
  Filled 2014-01-30: qty 4

## 2014-01-30 MED ORDER — VITAMIN D3 25 MCG (1000 UNIT) PO TABS
2000.0000 [IU] | ORAL_TABLET | Freq: Every day | ORAL | Status: DC
  Administered 2014-01-30 – 2014-02-03 (×5): 2000 [IU] via ORAL
  Filled 2014-01-30 (×5): qty 2

## 2014-01-30 MED ORDER — PHENOL 1.4 % MT LIQD
1.0000 | OROMUCOSAL | Status: DC | PRN
  Filled 2014-01-30: qty 177

## 2014-01-30 MED ORDER — METOPROLOL TARTRATE 1 MG/ML IV SOLN: 2.0000 mg | INTRAVENOUS | Status: DC | PRN

## 2014-01-30 MED ORDER — BETHANECHOL CHLORIDE 25 MG PO TABS
50.0000 mg | ORAL_TABLET | Freq: Two times a day (BID) | ORAL | Status: DC
  Administered 2014-01-30 – 2014-02-03 (×7): 50 mg via ORAL
  Filled 2014-01-30 (×9): qty 2

## 2014-01-30 MED ORDER — PANTOPRAZOLE SODIUM 40 MG PO TBEC
40.0000 mg | DELAYED_RELEASE_TABLET | Freq: Every day | ORAL | Status: DC
  Administered 2014-01-30 – 2014-02-03 (×5): 40 mg via ORAL
  Filled 2014-01-30 (×5): qty 1

## 2014-01-30 MED ORDER — MORPHINE SULFATE 4 MG/ML IJ SOLN
4.0000 mg | Freq: Once | INTRAMUSCULAR | Status: AC
  Administered 2014-01-30: 4 mg via INTRAVENOUS
  Filled 2014-01-30: qty 1

## 2014-01-30 MED ORDER — ACETAMINOPHEN 325 MG PO TABS: 325.0000 mg | ORAL_TABLET | ORAL | Status: DC | PRN

## 2014-01-30 MED ORDER — TRAMADOL HCL 50 MG PO TABS: 50.0000 mg | ORAL_TABLET | Freq: Four times a day (QID) | ORAL | Status: DC | PRN

## 2014-01-30 MED ORDER — FOLIC ACID 400 MCG PO TABS: 400.0000 ug | ORAL_TABLET | Freq: Every day | ORAL | Status: DC

## 2014-01-30 MED ORDER — LEVOTHYROXINE SODIUM 50 MCG PO TABS
50.0000 ug | ORAL_TABLET | Freq: Every day | ORAL | Status: DC
  Administered 2014-01-31 – 2014-02-03 (×4): 50 ug via ORAL
  Filled 2014-01-30 (×5): qty 1

## 2014-01-30 MED ORDER — HYDRALAZINE HCL 20 MG/ML IJ SOLN: 10.0000 mg | INTRAMUSCULAR | Status: DC | PRN

## 2014-01-30 MED ORDER — ALUM & MAG HYDROXIDE-SIMETH 200-200-20 MG/5ML PO SUSP: 15.0000 mL | ORAL | Status: DC | PRN

## 2014-01-30 MED ORDER — POTASSIUM CHLORIDE CRYS ER 20 MEQ PO TBCR
20.0000 meq | EXTENDED_RELEASE_TABLET | Freq: Once | ORAL | Status: DC
  Filled 2014-01-30: qty 2

## 2014-01-30 MED ORDER — SODIUM CHLORIDE 0.9 % IV SOLN
INTRAVENOUS | Status: DC
  Administered 2014-01-30: 16:00:00 via INTRAVENOUS

## 2014-01-30 MED ORDER — ONDANSETRON HCL 4 MG/2ML IJ SOLN: 4.0000 mg | Freq: Four times a day (QID) | INTRAMUSCULAR | Status: DC | PRN

## 2014-01-30 MED ORDER — BETHANECHOL CHLORIDE 25 MG PO TABS: 50.0000 mg | ORAL_TABLET | Freq: Two times a day (BID) | ORAL | Status: DC

## 2014-01-30 MED ORDER — LABETALOL HCL 300 MG PO TABS
300.0000 mg | ORAL_TABLET | Freq: Two times a day (BID) | ORAL | Status: DC
  Administered 2014-01-30: 150 mg via ORAL
  Filled 2014-01-30 (×3): qty 1

## 2014-01-30 MED ORDER — FOLIC ACID 0.5 MG HALF TAB
0.5000 mg | ORAL_TABLET | Freq: Every day | ORAL | Status: DC
  Administered 2014-02-01 – 2014-02-03 (×3): 0.5 mg via ORAL
  Filled 2014-01-30 (×4): qty 1

## 2014-01-30 MED ORDER — NITROFURANTOIN MACROCRYSTAL 100 MG PO CAPS
100.0000 mg | ORAL_CAPSULE | Freq: Every day | ORAL | Status: DC
  Administered 2014-01-30 – 2014-02-02 (×4): 100 mg via ORAL
  Filled 2014-01-30 (×6): qty 1

## 2014-01-30 MED ORDER — ACETAMINOPHEN 650 MG RE SUPP: 325.0000 mg | RECTAL | Status: DC | PRN

## 2014-01-30 MED ORDER — EZETIMIBE 10 MG PO TABS
10.0000 mg | ORAL_TABLET | Freq: Every day | ORAL | Status: DC
  Administered 2014-01-30 – 2014-02-02 (×4): 10 mg via ORAL
  Filled 2014-01-30 (×5): qty 1

## 2014-01-30 NOTE — Consult Note (Signed)
Consult Note  Patient name: [name] MRN: [id number] DOB: 08/04/1934 Sex: female  Consulting Physician:  ER  Reason for Consult:  Chief Complaint  Patient presents with  . Bleeding/Bruising    HISTORY OF PRESENT ILLNESS: 80 yo s/p splenic artery embolization for large naeurysm by Dr. Imogene Burn.  Pt reported bruising in right leg and came to ER.. U/S shows large pseudo.  Pt denies pain.  She ismedically managed for HTn and cypercholesterolemia.  Past Medical History  Diagnosis Date  . Hypertension   . High cholesterol   . Stroke 1990's    "mild", denies residual on 01/28/2014  . Pneumonia     "maybe in my teens"  . Hypothyroidism   . Anemia   . History of blood transfusion     "S/P knee OR"  . Arthritis     "was in my knees"  . Recurrent UTI (urinary tract infection)     Past Surgical History  Procedure Laterality Date  . Muscle biopsy Left 1990's    "knot biopsy from carrying mail bag for years"  . Bladder suspension  2012  . Splenic artery embolization  01/28/2014  . Joint replacement    . Total knee arthroplasty Bilateral 2012  . Vaginal hysterectomy      History   Social History  . Marital Status: Married    Spouse Name: N/A    Number of Children: N/A  . Years of Education: N/A   Occupational History  . Not on file.   Social History Main Topics  . Smoking status: Never Smoker   . Smokeless tobacco: Never Used  . Alcohol Use: No  . Drug Use: No  . Sexual Activity: No   Other Topics Concern  . Not on file   Social History Narrative  . No narrative on file    No family history on file.  Allergies as of 01/30/2014 - Review Complete 01/30/2014  Allergen Reaction Noted  . Aspirin Other (See Comments) 12/04/2013    No current facility-administered medications on file prior to encounter.   Current Outpatient Prescriptions on File Prior to Encounter  Medication Sig Dispense Refill  . bethanechol (URECHOLINE) 50 MG tablet Take 50 mg  by mouth 2 (two) times daily.      . Calcium Carbonate-Vitamin D (CALCIUM 500 + D PO) Take 500 mg by mouth 2 (two) times daily.      . cholecalciferol (VITAMIN D) 1000 UNITS tablet Take 2,000 Units by mouth daily.      . clopidogrel (PLAVIX) 75 MG tablet Take 75 mg by mouth at bedtime.       . Cranberry-Vitamin C-Vitamin E (CRANBERRY PLUS VITAMIN C) 140-100-3 MG-MG-UNIT CAPS Take 2,000 Units by mouth 2 (two) times daily.      Marland Kitchen ezetimibe (ZETIA) 10 MG tablet Take 10 mg by mouth at bedtime.       . folic acid (FOLVITE) 400 MCG tablet Take 400 mcg by mouth daily.      Marland Kitchen labetalol (NORMODYNE) 200 MG tablet Take 300 mg by mouth 2 (two) times daily. Take 1 1/2 tablets twice daily      . levothyroxine (SYNTHROID, LEVOTHROID) 50 MCG tablet Take 50 mcg by mouth daily before breakfast.      . lisinopril (PRINIVIL,ZESTRIL) 40 MG tablet Take 40 mg by mouth daily.      . nitrofurantoin (MACRODANTIN) 100 MG capsule Take 100 mg by mouth at bedtime.       Marland Kitchen  Omega-3 Fatty Acids (FISH OIL) 1000 MG CAPS Take 1,000 mg by mouth 2 (two) times daily.      . psyllium (METAMUCIL) 58.6 % packet Take 1 packet by mouth daily.      . tamsulosin (FLOMAX) 0.4 MG CAPS capsule Take 0.4 mg by mouth 2 (two) times daily.         REVIEW OF SYSTEMS: See HPI, o/w all systems negative  PHYSICAL EXAMINATION: General: The patient appears their stated age.  Vital signs are BP 133/69  Pulse 58  Temp(Src) 97.9 F (36.6 C) (Oral)  Resp 22  Ht 5\' 7"  (1.702 m)  Wt 197 lb (89.359 kg)  BMI 30.85 kg/m2  SpO2 99% Pulmonary: Respirations are non-labored HEENT:  No gross abnormalities Abdomen: Soft and non-tender  Musculoskeletal: There are no major deformities.   Neurologic: No focal weakness or paresthesias are detected, Skin: significant right thigh ecchymosis Psychiatric: The patient has normal affect. Cardiovascular: There is a regular rate and rhythm without significant murmur appreciated.Palpable pulsatile mass in right  groin  Diagnostic Studies: U/S has been reviewed and shows 4x2 pseudo off of CFA   Assessment:  Right groin psuedo Plan: Patient failed initial attempt at u/s guided compression.  Will repeat.  If unsuccessful, will need surgical repair.  I do not think she is a good candidate for thrombin injection.  She ate on her way to the ER, so will repair in am in u/s not successful     V. Charlena Cross, M.D. [facility] Office: [phone] Pager:  [phone]

## 2014-01-30 NOTE — Progress Notes (Addendum)
VASCULAR LAB PRELIMINARY  PRELIMINARY  PRELIMINARY  PRELIMINARY  Ultrasound of the right groin completed.    Preliminary report:  There is a pseudoaneurysm measuring 2.13cm X 4.07cm with a neck measuring 0.39cm noted in the right groin.                                  Attempted compression X 30 minutes.  Compression failed.    Iantha Fallen, RVT  01/30/2014, 9:49 AM

## 2014-01-30 NOTE — ED Notes (Addendum)
She was discharged from the hospital yesterday after a splenic artery surgery. She noticed this morning she had bruising to lower abd and upper R leg near the catheter insertion site. She states this bruising was not present yesterday. She states is not painful.

## 2014-01-30 NOTE — ED Notes (Signed)
Vascular MD at bedside.

## 2014-01-30 NOTE — ED Notes (Signed)
Consult MD at bedside.

## 2014-01-30 NOTE — ED Provider Notes (Signed)
CSN: [id number]     Arrival date & time 01/30/14  3500 History   First MD Initiated Contact with Patient 01/30/14 [phone]     Chief Complaint  Patient presents with  . Bleeding/Bruising     (Consider location/radiation/quality/duration/timing/severity/associated sxs/prior Treatment) The history is provided by the patient.  [name] is a 80 y.o. female hx of HTN, HL, splenic artery aneurysm s/p embolization 2 days ago here with R groin swelling. She had a catheter placed in the right femoral artery for the embolization procedure. She was discharged yesterday and today noticed swelling R groin area. Some right thigh pain but was concerned about the bruising. She is on plavix but not on coumadin. Denies abdominal pain or abdominal bruising or vomiting.    Past Medical History  Diagnosis Date  . Hypertension   . High cholesterol   . Stroke 1990's    "mild", denies residual on 01/28/2014  . Pneumonia     "maybe in my teens"  . Hypothyroidism   . Anemia   . History of blood transfusion     "S/P knee OR"  . Arthritis     "was in my knees"  . Recurrent UTI (urinary tract infection)    Past Surgical History  Procedure Laterality Date  . Muscle biopsy Left 1990's    "knot biopsy from carrying mail bag for years"  . Bladder suspension  2012  . Splenic artery embolization  01/28/2014  . Joint replacement    . Total knee arthroplasty Bilateral 2012  . Vaginal hysterectomy     No family history on file. History  Substance Use Topics  . Smoking status: Never Smoker   . Smokeless tobacco: Never Used  . Alcohol Use: No   OB History   Grav Para Term Preterm Abortions TAB SAB Ect Mult Living                 Review of Systems  Skin: Positive for color change.  All other systems reviewed and are negative.     Allergies  Aspirin  Home Medications   Prior to Admission medications   Medication Sig Start Date End Date Taking? Authorizing Provider  bethanechol  (URECHOLINE) 50 MG tablet Take 50 mg by mouth 2 (two) times daily.    Historical Provider, MD  Calcium Carbonate-Vitamin D (CALCIUM 500 + D PO) Take 500 mg by mouth 2 (two) times daily.    Historical Provider, MD  cholecalciferol (VITAMIN D) 1000 UNITS tablet Take 2,000 Units by mouth daily.    Historical Provider, MD  clopidogrel (PLAVIX) 75 MG tablet Take 75 mg by mouth at bedtime.     Historical Provider, MD  Cranberry-Vitamin C-Vitamin E (CRANBERRY PLUS VITAMIN C) 140-100-3 MG-MG-UNIT CAPS Take 2,000 Units by mouth 2 (two) times daily.    Historical Provider, MD  ezetimibe (ZETIA) 10 MG tablet Take 10 mg by mouth at bedtime.     Historical Provider, MD  folic acid (FOLVITE) 829 MCG tablet Take 400 mcg by mouth daily.    Historical Provider, MD  labetalol (NORMODYNE) 200 MG tablet Take 300 mg by mouth 2 (two) times daily. Take 1 1/2 tablets twice daily    Historical Provider, MD  levothyroxine (SYNTHROID, LEVOTHROID) 50 MCG tablet Take 50 mcg by mouth daily before breakfast.    Historical Provider, MD  lisinopril (PRINIVIL,ZESTRIL) 40 MG tablet Take 40 mg by mouth daily.    Historical Provider, MD  nitrofurantoin (MACRODANTIN) 100 MG capsule Take 100 mg by  mouth at bedtime.     Historical Provider, MD  Omega-3 Fatty Acids (FISH OIL) 1000 MG CAPS Take 1,000 mg by mouth 2 (two) times daily.    Historical Provider, MD  psyllium (METAMUCIL) 58.6 % packet Take 1 packet by mouth daily.    Historical Provider, MD  tamsulosin (FLOMAX) 0.4 MG CAPS capsule Take 0.4 mg by mouth 2 (two) times daily.    Historical Provider, MD   BP 119/69  Pulse 70  Temp(Src) 97.9 F (36.6 C) (Oral)  Resp 16  Ht 5\' 7"  (1.702 m)  Wt 197 lb (89.359 kg)  BMI 30.85 kg/m2  SpO2 98% Physical Exam  Nursing note and vitals reviewed. Constitutional: She is oriented to person, place, and time. She appears well-developed and well-nourished.  HENT:  Head: Normocephalic.  Mouth/Throat: Oropharynx is clear and moist.  Eyes:  Conjunctivae are normal. Pupils are equal, round, and reactive to light.  Neck: Normal range of motion. Neck supple.  Cardiovascular: Normal rate, regular rhythm and normal heart sounds.   Pulmonary/Chest: Effort normal and breath sounds normal. No respiratory distress. She has no wheezes. She has no rales.  Abdominal: Soft. Bowel sounds are normal. She exhibits no distension. There is no tenderness. There is no rebound and no guarding.  Musculoskeletal:  R groin with bruising down to knee on the medial aspect. 2+ femoral and popliteal and dorsalis pedis pulses. Nl ROM hip and knee.   Neurological: She is alert and oriented to person, place, and time. No cranial nerve deficit. Coordination normal.  Skin: Skin is warm and dry.  Psychiatric: She has a normal mood and affect. Her behavior is normal. Judgment and thought content normal.    ED Course  Procedures (including critical care time) Labs Review Labs Reviewed  CBC WITH DIFFERENTIAL - Abnormal; Notable for the following:    RBC 3.04 (*)    Hemoglobin 9.9 (*)    HCT 28.0 (*)    Platelets 95 (*)    Monocytes Relative 13 (*)    All other components within normal limits  COMPREHENSIVE METABOLIC PANEL    Imaging Review No results found.   EKG Interpretation None      MDM   Final diagnoses:  None   [name] is a 80 y.o. female here with R leg bruising. Will get [place] to r/o pseudoaneurysm. Will check CBC.   9:57 AM CBC stable. US showed pseudoaneurysm 4 cm x 2 cm. I called Dr. Trula Slade, vascular surgeon on call, who will see patient.   12 PM Dr. Trula Slade and the [place] tech tried to compress the pseudoaneurysm but unable to reduce it. Will admit and take her to the OR tomorrow.   Wandra Arthurs, MD 01/30/14 [phone]

## 2014-01-30 NOTE — ED Notes (Signed)
Attempted report 

## 2014-01-31 ENCOUNTER — Encounter (HOSPITAL_COMMUNITY)
Admission: EM | Disposition: A | Discharge status: Home / Self Care | Payer: Self-pay | Source: Home / Self Care | Attending: Vascular Surgery

## 2014-01-31 ENCOUNTER — Encounter (HOSPITAL_COMMUNITY): Payer: Self-pay | Admitting: Anesthesiology

## 2014-01-31 ENCOUNTER — Inpatient Hospital Stay (HOSPITAL_COMMUNITY): Payer: Medicare Other | Admitting: Anesthesiology

## 2014-01-31 ENCOUNTER — Encounter (HOSPITAL_COMMUNITY): Payer: Medicare Other | Admitting: Anesthesiology

## 2014-01-31 DIAGNOSIS — D649 Anemia, unspecified: Secondary | ICD-10-CM | POA: Diagnosis not present

## 2014-01-31 DIAGNOSIS — E039 Hypothyroidism, unspecified: Secondary | ICD-10-CM | POA: Diagnosis not present

## 2014-01-31 DIAGNOSIS — I999 Unspecified disorder of circulatory system: Secondary | ICD-10-CM | POA: Diagnosis not present

## 2014-01-31 DIAGNOSIS — I724 Aneurysm of artery of lower extremity: Secondary | ICD-10-CM | POA: Diagnosis not present

## 2014-01-31 DIAGNOSIS — E7800 Pure hypercholesterolemia, unspecified: Secondary | ICD-10-CM | POA: Diagnosis not present

## 2014-01-31 DIAGNOSIS — I10 Essential (primary) hypertension: Secondary | ICD-10-CM | POA: Diagnosis not present

## 2014-01-31 HISTORY — PX: REVISON OF ARTERIOVENOUS FISTULA: SHX6074

## 2014-01-31 LAB — CBC
HEMATOCRIT: 23.8 % — AB (ref 36.0–46.0)
Hemoglobin: 8.2 g/dL — ABNORMAL LOW (ref 12.0–15.0)
MCH: 32 pg (ref 26.0–34.0)
MCHC: 34.5 g/dL (ref 30.0–36.0)
MCV: 93 fL (ref 78.0–100.0)
PLATELETS: 90 10*3/uL — AB (ref 150–400)
RBC: 2.56 MIL/uL — ABNORMAL LOW (ref 3.87–5.11)
RDW: 13.4 % (ref 11.5–15.5)
WBC: 4.6 10*3/uL (ref 4.0–10.5)

## 2014-01-31 LAB — CREATININE, SERUM
Creatinine, Ser: 0.72 mg/dL (ref 0.50–1.10)
GFR calc Af Amer: >90 mL/min (ref >90)
GFR calc non Af Amer: 81 mL/min — ABNORMAL LOW (ref >90)

## 2014-01-31 SURGERY — REVISON OF ARTERIOVENOUS FISTULA
Anesthesia: General | Laterality: Right

## 2014-01-31 MED ORDER — SODIUM CHLORIDE 0.9 % IV SOLN: 500.0000 mL | Freq: Once | INTRAVENOUS | Status: AC | PRN

## 2014-01-31 MED ORDER — PROPOFOL 10 MG/ML IV BOLUS
INTRAVENOUS | Status: DC | PRN
  Administered 2014-01-31: 140 mg via INTRAVENOUS

## 2014-01-31 MED ORDER — FENTANYL CITRATE 0.05 MG/ML IJ SOLN
INTRAMUSCULAR | Status: DC | PRN
  Administered 2014-01-31: 50 ug via INTRAVENOUS
  Administered 2014-01-31: 150 ug via INTRAVENOUS

## 2014-01-31 MED ORDER — BISACODYL 5 MG PO TBEC: 5.0000 mg | DELAYED_RELEASE_TABLET | Freq: Every day | ORAL | Status: DC | PRN

## 2014-01-31 MED ORDER — DOCUSATE SODIUM 100 MG PO CAPS
100.0000 mg | ORAL_CAPSULE | Freq: Every day | ORAL | Status: DC
  Administered 2014-02-01 – 2014-02-03 (×3): 100 mg via ORAL
  Filled 2014-01-31 (×3): qty 1

## 2014-01-31 MED ORDER — SODIUM CHLORIDE 0.9 % IV SOLN
INTRAVENOUS | Status: DC
  Administered 2014-01-31 – 2014-02-01 (×2): via INTRAVENOUS

## 2014-01-31 MED ORDER — SENNOSIDES-DOCUSATE SODIUM 8.6-50 MG PO TABS
1.0000 | ORAL_TABLET | Freq: Every evening | ORAL | Status: DC | PRN
  Administered 2014-02-02: 1 via ORAL
  Filled 2014-01-31 (×2): qty 1

## 2014-01-31 MED ORDER — SODIUM CHLORIDE 0.9 % IR SOLN
Status: DC | PRN
  Administered 2014-01-31: 10:00:00

## 2014-01-31 MED ORDER — 0.9 % SODIUM CHLORIDE (POUR BTL) OPTIME
TOPICAL | Status: DC | PRN
  Administered 2014-01-31: 150 mL

## 2014-01-31 MED ORDER — HYDROMORPHONE HCL PF 1 MG/ML IJ SOLN
0.2500 mg | INTRAMUSCULAR | Status: DC | PRN
  Administered 2014-01-31 (×3): 0.5 mg via INTRAVENOUS

## 2014-01-31 MED ORDER — HYDROMORPHONE HCL PF 1 MG/ML IJ SOLN
INTRAMUSCULAR | Status: AC
  Administered 2014-01-31: 0.5 mg via INTRAVENOUS
  Filled 2014-01-31: qty 1

## 2014-01-31 MED ORDER — PHENYLEPHRINE HCL 10 MG/ML IJ SOLN
10.0000 mg | INTRAMUSCULAR | Status: DC | PRN
  Administered 2014-01-31: 30 ug/min via INTRAVENOUS

## 2014-01-31 MED ORDER — THROMBIN 20000 UNITS EX SOLR
CUTANEOUS | Status: AC
  Filled 2014-01-31: qty 20000

## 2014-01-31 MED ORDER — ONDANSETRON HCL 4 MG/2ML IJ SOLN
INTRAMUSCULAR | Status: DC | PRN
  Administered 2014-01-31: 4 mg via INTRAVENOUS

## 2014-01-31 MED ORDER — MORPHINE SULFATE 2 MG/ML IJ SOLN
2.0000 mg | INTRAMUSCULAR | Status: DC | PRN
  Administered 2014-01-31: 2 mg via INTRAVENOUS
  Administered 2014-02-01: 4 mg via INTRAVENOUS
  Administered 2014-02-01: 2 mg via INTRAVENOUS
  Filled 2014-01-31: qty 2
  Filled 2014-01-31: qty 1

## 2014-01-31 MED ORDER — DEXTROSE 5 % IV SOLN
1.5000 g | Freq: Two times a day (BID) | INTRAVENOUS | Status: AC
  Administered 2014-01-31 – 2014-02-01 (×2): 1.5 g via INTRAVENOUS
  Filled 2014-01-31 (×2): qty 1.5

## 2014-01-31 MED ORDER — DOPAMINE-DEXTROSE 3.2-5 MG/ML-% IV SOLN: 3.0000 ug/kg/min | INTRAVENOUS | Status: DC

## 2014-01-31 MED ORDER — LACTATED RINGERS IV SOLN
INTRAVENOUS | Status: DC | PRN
  Administered 2014-01-31: 09:00:00 via INTRAVENOUS

## 2014-01-31 MED ORDER — MAGNESIUM SULFATE 40 MG/ML IJ SOLN
2.0000 g | Freq: Every day | INTRAMUSCULAR | Status: DC | PRN
  Filled 2014-01-31: qty 50

## 2014-01-31 MED ORDER — OXYCODONE HCL 5 MG PO TABS
5.0000 mg | ORAL_TABLET | ORAL | Status: DC | PRN
  Administered 2014-02-01: 10 mg via ORAL
  Administered 2014-02-01: 5 mg via ORAL
  Filled 2014-01-31: qty 1
  Filled 2014-01-31: qty 2

## 2014-01-31 MED ORDER — SUCCINYLCHOLINE CHLORIDE 20 MG/ML IJ SOLN
INTRAMUSCULAR | Status: DC | PRN
  Administered 2014-01-31: 100 mg via INTRAVENOUS

## 2014-01-31 MED ORDER — HYDROMORPHONE HCL PF 1 MG/ML IJ SOLN
INTRAMUSCULAR | Status: AC
  Filled 2014-01-31: qty 1

## 2014-01-31 MED ORDER — ROCURONIUM BROMIDE 100 MG/10ML IV SOLN
INTRAVENOUS | Status: DC | PRN
  Administered 2014-01-31: 20 mg via INTRAVENOUS

## 2014-01-31 MED ORDER — FENTANYL CITRATE 0.05 MG/ML IJ SOLN
INTRAMUSCULAR | Status: AC
  Filled 2014-01-31: qty 5

## 2014-01-31 MED ORDER — GLYCOPYRROLATE 0.2 MG/ML IJ SOLN
INTRAMUSCULAR | Status: DC | PRN
  Administered 2014-01-31: .6 mg via INTRAVENOUS

## 2014-01-31 MED ORDER — MORPHINE SULFATE 2 MG/ML IJ SOLN
INTRAMUSCULAR | Status: AC
  Filled 2014-01-31: qty 1

## 2014-01-31 MED ORDER — LABETALOL HCL 300 MG PO TABS
150.0000 mg | ORAL_TABLET | Freq: Two times a day (BID) | ORAL | Status: DC
  Administered 2014-01-31 – 2014-02-03 (×6): 150 mg via ORAL
  Filled 2014-01-31 (×9): qty 0.5

## 2014-01-31 MED ORDER — PHENYLEPHRINE HCL 10 MG/ML IJ SOLN
INTRAMUSCULAR | Status: DC | PRN
  Administered 2014-01-31: 80 ug via INTRAVENOUS

## 2014-01-31 MED ORDER — POTASSIUM CHLORIDE CRYS ER 20 MEQ PO TBCR: 20.0000 meq | EXTENDED_RELEASE_TABLET | Freq: Every day | ORAL | Status: DC | PRN

## 2014-01-31 MED ORDER — ENOXAPARIN SODIUM 40 MG/0.4ML ~~LOC~~ SOLN
40.0000 mg | SUBCUTANEOUS | Status: DC
  Administered 2014-01-31 – 2014-02-02 (×3): 40 mg via SUBCUTANEOUS
  Filled 2014-01-31 (×4): qty 0.4

## 2014-01-31 MED ORDER — NEOSTIGMINE METHYLSULFATE 10 MG/10ML IV SOLN
INTRAVENOUS | Status: DC | PRN
  Administered 2014-01-31: 3 mg via INTRAVENOUS

## 2014-01-31 MED ORDER — CEFAZOLIN SODIUM-DEXTROSE 2-3 GM-% IV SOLR
INTRAVENOUS | Status: DC | PRN
  Administered 2014-01-31: 2 g via INTRAVENOUS

## 2014-01-31 MED ORDER — LIDOCAINE HCL (CARDIAC) 20 MG/ML IV SOLN
INTRAVENOUS | Status: DC | PRN
  Administered 2014-01-31: 75 mg via INTRAVENOUS

## 2014-01-31 MED ORDER — ONDANSETRON HCL 4 MG/2ML IJ SOLN: 4.0000 mg | Freq: Once | INTRAMUSCULAR | Status: DC | PRN

## 2014-01-31 MED ORDER — PROPOFOL 10 MG/ML IV BOLUS
INTRAVENOUS | Status: AC
  Filled 2014-01-31: qty 20

## 2014-01-31 SURGICAL SUPPLY — 43 items
BLADE 10 SAFETY STRL DISP (BLADE) ×3 IMPLANT
CANISTER SUCTION 2500CC (MISCELLANEOUS) ×3 IMPLANT
CLIP TI MEDIUM 6 (CLIP) ×3 IMPLANT
CLIP TI WIDE RED SMALL 24 (CLIP) ×3 IMPLANT
CLIP TI WIDE RED SMALL 6 (CLIP) ×3 IMPLANT
COVER PROBE W GEL 5X96 (DRAPES) ×3 IMPLANT
COVER SURGICAL LIGHT HANDLE (MISCELLANEOUS) ×3 IMPLANT
DERMABOND ADHESIVE PROPEN (GAUZE/BANDAGES/DRESSINGS) ×2
DERMABOND ADVANCED (GAUZE/BANDAGES/DRESSINGS) ×2
DERMABOND ADVANCED .7 DNX12 (GAUZE/BANDAGES/DRESSINGS) ×1 IMPLANT
DERMABOND ADVANCED .7 DNX6 (GAUZE/BANDAGES/DRESSINGS) ×1 IMPLANT
DRAIN CHANNEL 15F RND FF W/TCR (WOUND CARE) ×3 IMPLANT
ELECT REM PT RETURN 9FT ADLT (ELECTROSURGICAL) ×3
ELECTRODE REM PT RTRN 9FT ADLT (ELECTROSURGICAL) ×1 IMPLANT
EVACUATOR SILICONE 100CC (DRAIN) ×3 IMPLANT
GLOVE BIO SURGEON STRL SZ7 (GLOVE) ×3 IMPLANT
GLOVE BIO SURGEON STRL SZ7.5 (GLOVE) ×3 IMPLANT
GLOVE BIOGEL PI IND STRL 7.5 (GLOVE) ×5 IMPLANT
GLOVE BIOGEL PI INDICATOR 7.5 (GLOVE) ×10
GLOVE SS BIOGEL STRL SZ 7 (GLOVE) ×1 IMPLANT
GLOVE SUPERSENSE BIOGEL SZ 7 (GLOVE) ×2
GLOVE SURG SS PI 7.5 STRL IVOR (GLOVE) ×3 IMPLANT
GOWN STRL REUS W/ TWL LRG LVL3 (GOWN DISPOSABLE) ×2 IMPLANT
GOWN STRL REUS W/ TWL XL LVL3 (GOWN DISPOSABLE) ×1 IMPLANT
GOWN STRL REUS W/TWL LRG LVL3 (GOWN DISPOSABLE) ×4
GOWN STRL REUS W/TWL XL LVL3 (GOWN DISPOSABLE) ×2
HEMOSTAT SNOW SURGICEL 2X4 (HEMOSTASIS) IMPLANT
KIT BASIN OR (CUSTOM PROCEDURE TRAY) ×3 IMPLANT
KIT ROOM TURNOVER OR (KITS) ×3 IMPLANT
NS IRRIG 1000ML POUR BTL (IV SOLUTION) ×3 IMPLANT
PACK CV ACCESS (CUSTOM PROCEDURE TRAY) IMPLANT
PACK PERIPHERAL VASCULAR (CUSTOM PROCEDURE TRAY) ×3 IMPLANT
PAD ARMBOARD 7.5X6 YLW CONV (MISCELLANEOUS) ×6 IMPLANT
SUT ETHILON 3 0 PS 1 (SUTURE) ×3 IMPLANT
SUT PROLENE 5 0 C 1 24 (SUTURE) ×9 IMPLANT
SUT PROLENE 6 0 CC (SUTURE) ×3 IMPLANT
SUT VIC AB 3-0 SH 27 (SUTURE) ×2
SUT VIC AB 3-0 SH 27X BRD (SUTURE) ×1 IMPLANT
SUT VICRYL 4-0 PS2 18IN ABS (SUTURE) IMPLANT
TOWEL OR 17X24 6PK STRL BLUE (TOWEL DISPOSABLE) ×3 IMPLANT
TOWEL OR 17X26 10 PK STRL BLUE (TOWEL DISPOSABLE) ×3 IMPLANT
UNDERPAD 30X30 INCONTINENT (UNDERPADS AND DIAPERS) ×3 IMPLANT
WATER STERILE IRR 1000ML POUR (IV SOLUTION) ×3 IMPLANT

## 2014-01-31 NOTE — Anesthesia Procedure Notes (Signed)
Procedure Name: Intubation Date/Time: 01/31/2014 9:10 AM Performed by: Eligha Bridegroom Pre-anesthesia Checklist: Patient identified, Timeout performed, Emergency Drugs available, Suction available and Patient being monitored Patient Re-evaluated:Patient Re-evaluated prior to inductionOxygen Delivery Method: Circle system utilized Preoxygenation: Pre-oxygenation with 100% oxygen Intubation Type: IV induction, Cricoid Pressure applied and Rapid sequence Laryngoscope Size: Mac and 3 Grade View: Grade II Tube type: Oral Number of attempts: 1 Airway Equipment and Method: Stylet Placement Confirmation: ETT inserted through vocal cords under direct vision,  breath sounds checked- equal and bilateral and positive ETCO2 Secured at: 21 cm Tube secured with: Tape Dental Injury: Teeth and Oropharynx as per pre-operative assessment

## 2014-01-31 NOTE — H&P (View-Only) (Signed)
Consult Note  Patient name: [name] MRN: [id number] DOB: 08/04/1934 Sex: female  Consulting Physician:  ER  Reason for Consult:  Chief Complaint  Patient presents with  . Bleeding/Bruising    HISTORY OF PRESENT ILLNESS: 80 yo s/p splenic artery embolization for large naeurysm by Dr. Imogene Burn.  Pt reported bruising in right leg and came to ER.. U/S shows large pseudo.  Pt denies pain.  She ismedically managed for HTn and cypercholesterolemia.  Past Medical History  Diagnosis Date  . Hypertension   . High cholesterol   . Stroke 1990's    "mild", denies residual on 01/28/2014  . Pneumonia     "maybe in my teens"  . Hypothyroidism   . Anemia   . History of blood transfusion     "S/P knee OR"  . Arthritis     "was in my knees"  . Recurrent UTI (urinary tract infection)     Past Surgical History  Procedure Laterality Date  . Muscle biopsy Left 1990's    "knot biopsy from carrying mail bag for years"  . Bladder suspension  2012  . Splenic artery embolization  01/28/2014  . Joint replacement    . Total knee arthroplasty Bilateral 2012  . Vaginal hysterectomy      History   Social History  . Marital Status: Married    Spouse Name: N/A    Number of Children: N/A  . Years of Education: N/A   Occupational History  . Not on file.   Social History Main Topics  . Smoking status: Never Smoker   . Smokeless tobacco: Never Used  . Alcohol Use: No  . Drug Use: No  . Sexual Activity: No   Other Topics Concern  . Not on file   Social History Narrative  . No narrative on file    No family history on file.  Allergies as of 01/30/2014 - Review Complete 01/30/2014  Allergen Reaction Noted  . Aspirin Other (See Comments) 12/04/2013    No current facility-administered medications on file prior to encounter.   Current Outpatient Prescriptions on File Prior to Encounter  Medication Sig Dispense Refill  . bethanechol (URECHOLINE) 50 MG tablet Take 50 mg  by mouth 2 (two) times daily.      . Calcium Carbonate-Vitamin D (CALCIUM 500 + D PO) Take 500 mg by mouth 2 (two) times daily.      . cholecalciferol (VITAMIN D) 1000 UNITS tablet Take 2,000 Units by mouth daily.      . clopidogrel (PLAVIX) 75 MG tablet Take 75 mg by mouth at bedtime.       . Cranberry-Vitamin C-Vitamin E (CRANBERRY PLUS VITAMIN C) 140-100-3 MG-MG-UNIT CAPS Take 2,000 Units by mouth 2 (two) times daily.      Marland Kitchen ezetimibe (ZETIA) 10 MG tablet Take 10 mg by mouth at bedtime.       . folic acid (FOLVITE) 400 MCG tablet Take 400 mcg by mouth daily.      Marland Kitchen labetalol (NORMODYNE) 200 MG tablet Take 300 mg by mouth 2 (two) times daily. Take 1 1/2 tablets twice daily      . levothyroxine (SYNTHROID, LEVOTHROID) 50 MCG tablet Take 50 mcg by mouth daily before breakfast.      . lisinopril (PRINIVIL,ZESTRIL) 40 MG tablet Take 40 mg by mouth daily.      . nitrofurantoin (MACRODANTIN) 100 MG capsule Take 100 mg by mouth at bedtime.       Marland Kitchen  Omega-3 Fatty Acids (FISH OIL) 1000 MG CAPS Take 1,000 mg by mouth 2 (two) times daily.      . psyllium (METAMUCIL) 58.6 % packet Take 1 packet by mouth daily.      . tamsulosin (FLOMAX) 0.4 MG CAPS capsule Take 0.4 mg by mouth 2 (two) times daily.         REVIEW OF SYSTEMS: See HPI, o/w all systems negative  PHYSICAL EXAMINATION: General: The patient appears their stated age.  Vital signs are BP 133/69  Pulse 58  Temp(Src) 97.9 F (36.6 C) (Oral)  Resp 22  Ht 5\' 7"  (1.702 m)  Wt 197 lb (89.359 kg)  BMI 30.85 kg/m2  SpO2 99% Pulmonary: Respirations are non-labored HEENT:  No gross abnormalities Abdomen: Soft and non-tender  Musculoskeletal: There are no major deformities.   Neurologic: No focal weakness or paresthesias are detected, Skin: significant right thigh ecchymosis Psychiatric: The patient has normal affect. Cardiovascular: There is a regular rate and rhythm without significant murmur appreciated.Palpable pulsatile mass in right  groin  Diagnostic Studies: U/S has been reviewed and shows 4x2 pseudo off of CFA   Assessment:  Right groin psuedo Plan: Patient failed initial attempt at u/s guided compression.  Will repeat.  If unsuccessful, will need surgical repair.  I do not think she is a good candidate for thrombin injection.  She ate on her way to the ER, so will repair in am in u/s not successful     V. Charlena Cross, M.D. [facility] Office: [phone] Pager:  [phone]

## 2014-01-31 NOTE — Interval H&P Note (Signed)
History and Physical Interval Note:  01/31/2014 8:53 AM  [name]  has presented today for surgery, with the diagnosis of pseudoaneurysm  The various methods of treatment have been discussed with the patient and family. After consideration of risks, benefits and other options for treatment, the patient has consented to  Procedure(s): REPAIR PSEUDOANEURYSM (Right) as a surgical intervention .  The patient's history has been reviewed, patient examined, no change in status, stable for surgery.  I have reviewed the patient's chart and labs.  Questions were answered to the patient's satisfaction.     Serafina Mitchell

## 2014-01-31 NOTE — Anesthesia Postprocedure Evaluation (Signed)
  Anesthesia Post-op Note  Patient: [name]  Procedure(s) Performed: Procedure(s) with comments: REPAIR PSEUDOANEURYSM (Right) - Repair of Femoral Pseudoaneurysm.  Patient Location: PACU  Anesthesia Type:General  Level of Consciousness: awake, alert , oriented and patient cooperative  Airway and Oxygen Therapy: Patient Spontanous Breathing  Post-op Pain: mild  Post-op Assessment: Post-op Vital signs reviewed, Patient's Cardiovascular Status Stable, Respiratory Function Stable, Patent Airway and No signs of Nausea or vomiting  Post-op Vital Signs: stable  Last Vitals:  Filed Vitals:   01/31/14 1130  BP: 119/42  Pulse: 60  Temp:   Resp: 12    Complications: No apparent anesthesia complications

## 2014-01-31 NOTE — Anesthesia Preprocedure Evaluation (Signed)
Anesthesia Evaluation  Patient identified by MRN, date of birth, ID band Patient awake    Reviewed: Allergy & Precautions, H&P , NPO status , Patient's Chart, lab work & pertinent test results  Airway       Dental   Pulmonary          Cardiovascular hypertension, + Peripheral Vascular Disease     Neuro/Psych CVA    GI/Hepatic   Endo/Other  Hypothyroidism   Renal/GU      Musculoskeletal   Abdominal   Peds  Hematology  (+) anemia ,   Anesthesia Other Findings   Reproductive/Obstetrics                           Anesthesia Physical Anesthesia Plan  ASA: III  Anesthesia Plan: General   Post-op Pain Management:    Induction: Intravenous  Airway Management Planned: Oral ETT and LMA  Additional Equipment:   Intra-op Plan:   Post-operative Plan: Extubation in OR  Informed Consent: I have reviewed the patients History and Physical, chart, labs and discussed the procedure including the risks, benefits and alternatives for the proposed anesthesia with the patient or authorized representative who has indicated his/her understanding and acceptance.     Plan Discussed with:   Anesthesia Plan Comments:         Anesthesia Quick Evaluation

## 2014-01-31 NOTE — Transfer of Care (Signed)
Immediate Anesthesia Transfer of Care Note  Patient: [name]  Procedure(s) Performed: Procedure(s) with comments: REPAIR PSEUDOANEURYSM (Right) - Repair of Femoral Pseudoaneurysm.  Patient Location: PACU  Anesthesia Type:General  Level of Consciousness: sedated  Airway & Oxygen Therapy: Patient Spontanous Breathing and Patient connected to nasal cannula oxygen  Post-op Assessment: Report given to PACU RN and Post -op Vital signs reviewed and stable  Post vital signs: Reviewed and stable  Complications: No apparent anesthesia complications

## 2014-01-31 NOTE — Progress Notes (Signed)
Eyeglasses and partial plate returned to pt on arrival to 3 S 06

## 2014-01-31 NOTE — Op Note (Signed)
Patient name: [name] MRN: [id number] DOB: 11/25/1933 Sex: female  01/30/2014 - 01/31/2014 Pre-operative Diagnosis: Pseudoaneurysm, right groin Post-operative diagnosis:  Same Surgeon:  Nada Libman Assistants:  Orlean Bradford Procedure:   #1: Exposure of right common femoral artery   #2: Repair of right femoral pseudoaneurysm Anesthesia:  Gen. Blood Loss:  See anesthesia record Specimens:  None  Findings:  Primary repair of the sheath site, using 5-0 Prolene  Indications:  The patient is recently status post percutaneous intervention.  She presented to the hospital with a right femoral pseudoaneurysm and severe ecchymosis on her right leg.  She was having pain from this.  Ultrasound demonstrated a large right femoral pseudoaneurysm which was unable to be compressed.  I felt that it was better to proceed with operative repair of an to try a thrombin injection.  Procedure:  The patient was identified in the holding area and taken to [facility] OR ROOM 16  The patient was then placed supine on the table. general anesthesia was administered.  The patient was prepped and draped in the usual sterile fashion.  A time out was called and antibiotics were administered.  A longitudinal incision was made in the right groin.  Cautery was used to dissect the subcutaneous tissue.  I identified the inguinal ligament, above the pseudoaneurysm.  I sharply dissected out the proximal common femoral artery and encircled it with a vessel loop.  The proximal common femoral artery was then occluded.  I used blunt dissection to get into the pseudoaneurysm sac and exposed the arteriotomy.  A finger was placed on the arteriotomy and then sharply dissected out the superficial femoral and profunda femoral arteries.  These were encircled with Vesseloops which were then occluded.  I then freed up the anterior side of the common femoral artery and exposed the arteriotomy.  I closed this using 2 running 5-0 Prolene  sutures.  The groin was then irrigated.  Hemostasis was achieved.  I placed a 15 Blake drain brought out through a separate stab incision and sutured into place with 3-0 nylon.  I then reapproximated the femoral sheath with 2-0 Vicryl.  The subcutaneous tissue was then closed in multiple layers of 2 and 3-0 Vicryl.  The skin was closed with 4-0 Vicryl and Dermabond was applied.  There were no immediate complications.   Disposition:  To PACU in stable condition.   Juleen China, M.D. [facility] Office: [phone] Pager:  [phone]

## 2014-02-01 LAB — BASIC METABOLIC PANEL
BUN: 13 mg/dL (ref 6–23)
CALCIUM: 8.6 mg/dL (ref 8.4–10.5)
CO2: 21 meq/L (ref 19–32)
CREATININE: 0.78 mg/dL (ref 0.50–1.10)
Chloride: 103 mEq/L (ref 96–112)
GFR calc Af Amer: >90 mL/min (ref >90)
GFR calc non Af Amer: 79 mL/min — ABNORMAL LOW (ref >90)
GLUCOSE: 129 mg/dL — AB (ref 70–99)
Potassium: 4.9 mEq/L (ref 3.7–5.3)
Sodium: 134 mEq/L — ABNORMAL LOW (ref 137–147)

## 2014-02-01 LAB — CBC
HEMATOCRIT: 24.3 % — AB (ref 36.0–46.0)
Hemoglobin: 8.4 g/dL — ABNORMAL LOW (ref 12.0–15.0)
MCH: 32.2 pg (ref 26.0–34.0)
MCHC: 34.6 g/dL (ref 30.0–36.0)
MCV: 93.1 fL (ref 78.0–100.0)
PLATELETS: 86 10*3/uL — AB (ref 150–400)
RBC: 2.61 MIL/uL — AB (ref 3.87–5.11)
RDW: 13.3 % (ref 11.5–15.5)
WBC: 5.8 10*3/uL (ref 4.0–10.5)

## 2014-02-01 NOTE — Evaluation (Signed)
Physical Therapy Evaluation Patient Details Name: [name] MRN: [id number] DOB: 12-18-1933 Today's Date: 02/01/2014   History of Present Illness  Patient is a 80 yo female s/p Repair of Femoral Pseudoaneurysm.      Clinical Impression  Patient demonstrates deficits in mobility as indicated below. Will benefit from acute PT to progress mobility and prepare for dc home. Will see as indicated and progress as tolerated.    Follow Up Recommendations No PT follow up;Supervision - Intermittent    Equipment Recommendations  None recommended by PT    Recommendations for Other Services       Precautions / Restrictions        Mobility  Bed Mobility Overal bed mobility: Needs Assistance Bed Mobility: Supine to Sit     Supine to sit: Min assist     General bed mobility comments: VCs for positioning and assist to come to EOB, elevation of trunk   Transfers Overall transfer level: Needs assistance Equipment used: Rolling walker (2 wheeled) Transfers: Sit to/from [employer] Sit to Stand: Min guard Stand pivot transfers: Min guard       General transfer comment: VCs for hand placement  Ambulation/Gait Ambulation/Gait assistance: Supervision Ambulation Distance (Feet): 340 Feet Assistive device: Rolling walker (2 wheeled) Gait Pattern/deviations: Step-through pattern;Narrow base of support;Trunk flexed Gait velocity: decreased Gait velocity interpretation: Below normal speed for age/gender General Gait Details: steady with gait  Stairs            Wheelchair Mobility    Modified Rankin (Stroke Patients Only)       Balance Overall balance assessment: Needs assistance Sitting-balance support: Feet supported Sitting balance-Leahy Scale: Good     Standing balance support: Bilateral upper extremity supported;During functional activity Standing balance-Leahy Scale: Fair                               Pertinent Vitals/Pain  NAd, VSS    Home Living Family/patient expects to be discharged to:: Private residence Living Arrangements: Alone Available Help at Discharge: Friend(s) Type of Home: House Home Access: Ramped entrance     Home Layout: One level Home Equipment: Environmental consultant - 2 wheels;Cane - single point;Wheelchair - manual      Prior Function Level of Independence: Independent               Hand Dominance   Dominant Hand: Right    Extremity/Trunk Assessment               Lower Extremity Assessment: Generalized weakness         Communication   Communication: No difficulties  Cognition Arousal/Alertness: Awake/alert Behavior During Therapy: Anxious Overall Cognitive Status: Within Functional Limits for tasks assessed                      General Comments General comments (skin integrity, edema, etc.): patient with poor control of bladder at times, recieved saturated in urine and needing to urinate again (pt reports only able to urinate standing up)    Exercises        Assessment/Plan    PT Assessment Patient needs continued PT services  PT Diagnosis Difficulty walking;Abnormality of gait;Generalized weakness   PT Problem List Decreased activity tolerance;Decreased balance;Decreased mobility  PT Treatment Interventions DME instruction;Gait training;Functional mobility training;Therapeutic activities;Therapeutic exercise;Balance training;Patient/family education   PT Goals (Current goals can be found in the Care Plan section) Acute Rehab PT Goals Patient Stated  Goal: to get back home PT Goal Formulation: With patient Time For Goal Achievement: 02/08/14 Potential to Achieve Goals: Good    Frequency Min 4X/week   Barriers to discharge Decreased caregiver support      Co-evaluation               End of Session Equipment Utilized During Treatment: Gait belt Activity Tolerance: Patient tolerated treatment well Patient left: in chair;with call bell/phone  within reach Nurse Communication: Mobility status         Time: 7829-5621 PT Time Calculation (min): 27 min   Charges:   PT Evaluation $Initial PT Evaluation Tier I: 1 Procedure PT Treatments $Gait Training: 8-22 mins $Therapeutic Activity: 8-22 mins   PT G Codes:          Fabio Asa 02/01/2014, 9:17 AM Charlotte Crumb, PT DPT  [phone]

## 2014-02-01 NOTE — Progress Notes (Signed)
    Subjective  - POD #1, status post repair of right femoral pseudoaneurysm  No complaints this morning   Physical Exam:  Right groin is soft.  There is continued ecchymosis in the right thigh which is unchanged.  Both feet are warm and well perfused.       Assessment/Plan:  POD #1  Patient is doing very well.  I will transfer her to the floor today.  She will be up with assistance from physical therapy.  I will leave her drain overnight as it put out approximately 50 cc.  Butch Penny Laquinn Shippy 02/01/2014 9:48 AM --  Danley Danker Vitals:   02/01/14 0942  BP: 94/54  Pulse: 92  Temp:   Resp: 16    Intake/Output Summary (Last 24 hours) at 02/01/14 0948 Last data filed at 02/01/14 0600  Gross per 24 hour  Intake   3100 ml  Output 1032.5 ml  Net 2067.5 ml     Laboratory CBC    Component Value Date/Time   WBC 5.8 02/01/2014 0006   HGB 8.4* 02/01/2014 0006   HCT 24.3* 02/01/2014 0006   PLT 86* 02/01/2014 0006    BMET    Component Value Date/Time   NA 134* 02/01/2014 0006   K 4.9 02/01/2014 0006   CL 103 02/01/2014 0006   CO2 21 02/01/2014 0006   GLUCOSE 129* 02/01/2014 0006   BUN 13 02/01/2014 0006   CREATININE 0.78 02/01/2014 0006   CALCIUM 8.6 02/01/2014 0006   GFRNONAA 79* 02/01/2014 0006   GFRAA >90 02/01/2014 0006    COAG Lab Results  Component Value Date   INR 1.43 01/30/2014   INR 1.29 03/30/2010   INR 1.24 02/15/2010   No results found for this basename: PTT    Antibiotics Anti-infectives   Start     Dose/Rate Route Frequency Ordered Stop   01/31/14 1230  cefUROXime (ZINACEF) 1.5 g in dextrose 5 % 50 mL IVPB     1.5 g 100 mL/hr over 30 Minutes Intravenous Every 12 hours 01/31/14 1229 02/01/14 0050       V. Leia Alf, M.D. [facility] Office: [phone] Pager:  [phone]

## 2014-02-01 NOTE — Progress Notes (Signed)
Old groin dsg around JP drain saturated with bloody drainage. Dsg changed. Pt without pain. Will notify MD and continue to monitor site.

## 2014-02-01 NOTE — Progress Notes (Signed)
Occupational Therapy Evaluation Patient Details Name: [name] MRN: [id number] DOB: 1934-03-20 Today's Date: 02/01/2014    History of Present Illness Patient is a 80 yo female s/p REPAIR R femoral PSEUDOANEURYSM.   Clinical Impression   PTA, pt lived alone independently. Pt presents with decreased independence with mobility and ADL. Will benefit from acute OT to reach mod I goals with LB ADL with nec AE and functional transfers to facilitate D/C home with HHOT.     Follow Up Recommendations  Home health OT;Supervision - Intermittent    Equipment Recommendations  3 in 1 bedside comode    Recommendations for Other Services       Precautions / Restrictions Precautions Precautions: Fall      Mobility Bed Mobility Overal bed mobility: Modified Independent             General bed mobility comments: elevated HOB  Transfers Overall transfer level: Needs assistance Equipment used: 1 person hand held assist   Sit to Stand: Min guard Stand pivot transfers: Min guard            Balance     Sitting balance-Leahy Scale: Good       Standing balance-Leahy Scale: Fair                              ADL Overall ADL's : Needs assistance/impaired Eating/Feeding: Modified independent   Grooming: Set up   Upper Body Bathing: Set up   Lower Body Bathing: Minimal assistance   Upper Body Dressing : Set up   Lower Body Dressing: Minimal assistance   Toilet Transfer: Min guard   Toileting- Clothing Manipulation and Hygiene: Min guard;Sit to/from stand       Functional mobility during ADLs: Min guard General ADL Comments: May benefit from AE     Vision                     Perception     Praxis      Pertinent Vitals/Pain no apparent distress No c/o pain     Hand Dominance Right   Extremity/Trunk Assessment Upper Extremity Assessment Upper Extremity Assessment: Overall WFL for tasks assessed   Lower Extremity  Assessment Lower Extremity Assessment: Generalized weakness   Cervical / Trunk Assessment Cervical / Trunk Assessment: Normal   Communication Communication Communication: No difficulties   Cognition Arousal/Alertness: Awake/alert Behavior During Therapy: Anxious Overall Cognitive Status: Within Functional Limits for tasks assessed                     General Comments       Exercises Exercises: General Upper Extremity;General Lower Extremity  Pt completing general BLE and UE AROM   Shoulder Instructions      Home Living Family/patient expects to be discharged to:: Private residence Living Arrangements: Alone Available Help at Discharge: Friend(s) Type of Home: House Home Access: Ramped entrance     Home Layout: One level     Bathroom Shower/Tub: Producer, television/film/video: Handicapped height Bathroom Accessibility: Yes How Accessible: Accessible via walker Home Equipment: Walker - 2 wheels;Cane - single point;Wheelchair - manual;Grab bars - tub/shower;Wheelchair - power;Shower seat - built in          Prior Functioning/Environment Level of Independence: Independent             OT Diagnosis: Generalized weakness;Acute pain   OT Problem List: Decreased strength;Decreased activity tolerance;Decreased knowledge  of use of DME or AE;Pain   OT Treatment/Interventions: Self-care/ADL training;Therapeutic exercise;Energy conservation;DME and/or AE instruction;Therapeutic activities;Patient/family education    OT Goals(Current goals can be found in the care plan section) Acute Rehab OT Goals Patient Stated Goal: to get back home OT Goal Formulation: With patient Time For Goal Achievement: 02/15/14 Potential to Achieve Goals: Good  OT Frequency: Min 2X/week   Barriers to D/C:    pt has friend that she states can help her out       Co-evaluation              End of Session Equipment Utilized During Treatment: Gait belt Nurse Communication:  Mobility status;Other (comment) (rec for nursing to get chair alarm)  Activity Tolerance: Patient tolerated treatment well Patient left: in chair;with call bell/phone within reach   Time: 1720-1750 OT Time Calculation (min): 30 min Charges:  OT General Charges $OT Visit: 1 Procedure OT Evaluation $Initial OT Evaluation Tier I: 1 Procedure OT Treatments $Self Care/Home Management : 23-37 mins G-Codes:    Lorinda Creed Ethyn Schetter 2014-03-03, 6:14 PM   [facility], OTR/L  [phone] 03/03/14

## 2014-02-02 ENCOUNTER — Encounter (HOSPITAL_COMMUNITY): Payer: Self-pay | Admitting: Surgery

## 2014-02-02 LAB — CBC
HCT: 23 % — ABNORMAL LOW (ref 36.0–46.0)
Hemoglobin: 8.1 g/dL — ABNORMAL LOW (ref 12.0–15.0)
MCH: 32.5 pg (ref 26.0–34.0)
MCHC: 35.2 g/dL (ref 30.0–36.0)
MCV: 92.4 fL (ref 78.0–100.0)
Platelets: 90 10*3/uL — ABNORMAL LOW (ref 150–400)
RBC: 2.49 MIL/uL — ABNORMAL LOW (ref 3.87–5.11)
RDW: 13.5 % (ref 11.5–15.5)
WBC: 5.6 10*3/uL (ref 4.0–10.5)

## 2014-02-02 LAB — BASIC METABOLIC PANEL
BUN: 18 mg/dL (ref 6–23)
CO2: 22 mEq/L (ref 19–32)
CREATININE: 0.91 mg/dL (ref 0.50–1.10)
Calcium: 8.9 mg/dL (ref 8.4–10.5)
Chloride: 102 mEq/L (ref 96–112)
GFR, EST AFRICAN AMERICAN: 69 mL/min — AB (ref >90)
GFR, EST NON AFRICAN AMERICAN: 59 mL/min — AB (ref >90)
Glucose, Bld: 123 mg/dL — ABNORMAL HIGH (ref 70–99)
Potassium: 3.8 mEq/L (ref 3.7–5.3)
Sodium: 136 mEq/L — ABNORMAL LOW (ref 137–147)

## 2014-02-02 NOTE — Progress Notes (Signed)
Occupational Therapy Treatment Patient Details Name: [name] MRN: [id number] DOB: 01/23/1934 Today's Date: 02/02/2014    History of present illness Patient is a 80 yo female s/p REPAIR PSEUDOANEURYSM.   OT comments  Pt progressing well with BADLs - she currently, requires supervision.  She will have intermittent assist at home, and is eager for discharge.   Follow Up Recommendations  No OT follow up;Supervision - Intermittent    Equipment Recommendations  None recommended by OT    Recommendations for Other Services      Precautions / Restrictions Precautions Precautions: Fall       Mobility Bed Mobility Overal bed mobility: Needs Assistance Bed Mobility: Supine to Sit     Supine to sit: Modified independent (Device/Increase time)     General bed mobility comments: increased time to perform HOB elevated, heavy reliance on bed rail  Transfers Overall transfer level: Needs assistance Equipment used: Rolling walker (2 wheeled) Transfers: Sit to/from [employer] Sit to Stand: Supervision Stand pivot transfers: Supervision       General transfer comment: VCs for hand placement    Balance Overall balance assessment: Needs assistance Sitting-balance support: Feet supported Sitting balance-Leahy Scale: Good     Standing balance support: No upper extremity supported Standing balance-Leahy Scale: Good                     ADL       Grooming: Supervision/safety;Standing       Lower Body Bathing: Supervison/ safety;Sit to/from stand       Lower Body Dressing: Supervision/safety;Sit to/from stand   Toilet Transfer: Supervision/safety;Ambulation;Comfort height toilet   Toileting- Clothing Manipulation and Hygiene: Supervision/safety;Sit to/from stand       Functional mobility during ADLs: Supervision/safety General ADL Comments: Pt now able to reach bil. feet for LB bathing and dressing with increased time.  She is able to  simulate showering in standing with supervision, and able to verbalize safe method for performing IADLs      Vision                     Perception     Praxis      Cognition   Behavior During Therapy: [facility] for tasks assessed/performed Overall Cognitive Status: Within Functional Limits for tasks assessed                       Extremity/Trunk Assessment               Exercises     Shoulder Instructions       General Comments      Pertinent Vitals/ Pain       Denies pain  Home Living                                          Prior Functioning/Environment              Frequency Min 2X/week     Progress Toward Goals  OT Goals(current goals can now be found in the care plan section)  Progress towards OT goals: Progressing toward goals  Acute Rehab OT Goals Patient Stated Goal: to get back home ADL Goals Pt Will Perform Lower Body Bathing: with modified independence;sit to/from stand;with adaptive equipment Pt Will Perform Lower Body Dressing: with modified independence;with adaptive equipment;sit to/from stand Pt Will Transfer  to Toilet: with modified independence;ambulating;bedside commode Pt Will Perform Toileting - Clothing Manipulation and hygiene: with modified independence;sit to/from stand Pt/caregiver will Perform Home Exercise Program: Increased strength;Both right and left upper extremity;With theraband  Plan Discharge plan needs to be updated    Co-evaluation                 End of Session     Activity Tolerance Patient tolerated treatment well   Patient Left in chair;with call bell/phone within reach   Nurse Communication Mobility status        Time: 4782-9562 OT Time Calculation (min): 16 min  Charges: OT General Charges $OT Visit: 1 Procedure OT Treatments $Self Care/Home Management : 8-22 mins  Tarez Bowns M Margery Szostak 02/02/2014, 12:12 PM

## 2014-02-02 NOTE — Progress Notes (Signed)
Physical Therapy Treatment Patient Details Name: [name] MRN: [id number] DOB: March 11, 1934 Today's Date: 13-Feb-2014    History of Present Illness Patient is a 80 yo female s/p REPAIR PSEUDOANEURYSM.    PT Comments    Patient demonstrates improvements in gait and mobility today. Ambulating well with RW (will try to progress without device).   Follow Up Recommendations  No PT follow up;Supervision - Intermittent     Equipment Recommendations  None recommended by PT    Recommendations for Other Services       Precautions / Restrictions Precautions Precautions: Fall    Mobility  Bed Mobility Overal bed mobility: Needs Assistance Bed Mobility: Supine to Sit     Supine to sit: Modified independent (Device/Increase time)     General bed mobility comments: increased time to perform HOB elevated, heavy reliance on bed rail  Transfers Overall transfer level: Needs assistance Equipment used: Rolling walker (2 wheeled) Transfers: Sit to/from [employer] Sit to Stand: Supervision Stand pivot transfers: Supervision       General transfer comment: VCs for hand placement  Ambulation/Gait Ambulation/Gait assistance: Supervision Ambulation Distance (Feet): 360 Feet Assistive device: Rolling walker (2 wheeled) Gait Pattern/deviations: Step-through pattern;Narrow base of support;Trunk flexed Gait velocity: improved cadence today Gait velocity interpretation: at or above normal speed for age/gender General Gait Details: steady with gait   Stairs            Wheelchair Mobility    Modified Rankin (Stroke Patients Only)       Balance Overall balance assessment: Needs assistance Sitting-balance support: Feet supported Sitting balance-Leahy Scale: Good     Standing balance support: Bilateral upper extremity supported Standing balance-Leahy Scale: Fair                      Cognition Arousal/Alertness: Awake/alert Behavior  During Therapy: Anxious Overall Cognitive Status: Within Functional Limits for tasks assessed                      Exercises      General Comments General comments (skin integrity, edema, etc.): Pt able to perform pericare and self care tasks with supervision.       Pertinent Vitals/Pain Reports "soreness but not really pain"    Home Living                      Prior Function            PT Goals (current goals can now be found in the care plan section) Acute Rehab PT Goals Patient Stated Goal: to get back home PT Goal Formulation: With patient Time For Goal Achievement: 02/08/14 Potential to Achieve Goals: Good Progress towards PT goals: Progressing toward goals    Frequency  Min 4X/week    PT Plan Current plan remains appropriate    Co-evaluation             End of Session Equipment Utilized During Treatment: Gait belt Activity Tolerance: Patient tolerated treatment well Patient left: in chair;with call bell/phone within reach     Time: 0838-0902 PT Time Calculation (min): 24 min  Charges:  $Gait Training: 8-22 mins $Therapeutic Activity: 8-22 mins                    G Codes:      Duncan Dull 2014-02-13, 9:09 AM Alben Deeds, PT DPT  [phone]

## 2014-02-02 NOTE — Progress Notes (Signed)
Pt transferred to [id number] via wheelchair with telemetry in place. Reason for transfer explained with teach back. Pt without discomfort, distress or pain. Report called to Lyn the receiving RN, all questions answered.

## 2014-02-02 NOTE — Progress Notes (Signed)
    Subjective  - POD #2  Feels better today   Physical Exam:  Right groin soft, palpable pulse Ecchymosis right groin and thigh unchanged       Assessment/Plan:  POD #2  Transfer to 2000 Likely d/c in am D/c drain  Serafina Mitchell 02/02/2014 5:04 PM --  Danley Danker Vitals:   02/02/14 1404  BP:   Pulse:   Temp: 98 F (36.7 C)  Resp:     Intake/Output Summary (Last 24 hours) at 02/02/14 1704 Last data filed at 02/02/14 1300  Gross per 24 hour  Intake    800 ml  Output    503 ml  Net    297 ml     Laboratory CBC    Component Value Date/Time   WBC 5.6 02/02/2014 0335   HGB 8.1* 02/02/2014 0335   HCT 23.0* 02/02/2014 0335   PLT 90* 02/02/2014 0335    BMET    Component Value Date/Time   NA 136* 02/02/2014 0335   K 3.8 02/02/2014 0335   CL 102 02/02/2014 0335   CO2 22 02/02/2014 0335   GLUCOSE 123* 02/02/2014 0335   BUN 18 02/02/2014 0335   CREATININE 0.91 02/02/2014 0335   CALCIUM 8.9 02/02/2014 0335   GFRNONAA 59* 02/02/2014 0335   GFRAA 69* 02/02/2014 0335    COAG Lab Results  Component Value Date   INR 1.43 01/30/2014   INR 1.29 03/30/2010   INR 1.24 02/15/2010   No results found for this basename: PTT    Antibiotics Anti-infectives   Start     Dose/Rate Route Frequency Ordered Stop   01/31/14 1230  cefUROXime (ZINACEF) 1.5 g in dextrose 5 % 50 mL IVPB     1.5 g 100 mL/hr over 30 Minutes Intravenous Every 12 hours 01/31/14 1229 02/01/14 0050       V. Leia Alf, M.D. [facility] [place] Office: [phone] Pager:  [phone]

## 2014-02-03 ENCOUNTER — Telehealth: Payer: Self-pay | Admitting: Vascular Surgery

## 2014-02-03 MED ORDER — OXYCODONE HCL 5 MG PO TABS
5.0000 mg | ORAL_TABLET | Freq: Four times a day (QID) | ORAL | Status: DC | PRN
Start: 2014-02-03 — End: 2014-03-23

## 2014-02-03 NOTE — Discharge Summary (Signed)
[facility] Discharge Summary   Patient ID:  [name] MRN: [id number] DOB/AGE: 12-02-1933 80 y.o.  Admit date: 01/30/2014 Discharge date: 02/03/2014 Date of Surgery: 01/30/2014 - 01/31/2014 Surgeon: Moishe Spice): Nada Libman, MD  Admission Diagnosis: Pseudoaneurysm following procedure [997.79, 442.9]  Discharge Diagnoses:  Pseudoaneurysm following procedure [997.79, 442.9]  Secondary Diagnoses: Past Medical History  Diagnosis Date  . Hypertension   . High cholesterol   . Stroke 1990's    "mild", denies residual on 01/28/2014  . Pneumonia     "maybe in my teens"  . Hypothyroidism   . Anemia   . History of blood transfusion     "S/P knee OR"  . Arthritis     "was in my knees"  . Recurrent UTI (urinary tract infection)     Procedure(s): REPAIR PSEUDOANEURYSM  Discharged Condition: good  HPI:  80 y/o female who underwent: PROCEDURE:  1. Right common femoral artery cannulation under ultrasound guidance  2. Placement of catheter in aorta  3. Aortogram  4. Selection of splenic artery  5. Splenic artery angiogram  6. Embolization of splenic artery x 10 coils (Azur CX 8 mm x 28 cm, CX 6 mm x 20 cm, CX 6 mm x 20 cm, Framing 14 mm x 34 cm, Framing 14 mm x 34 cm, Azur 20 mm x 20 cm, Azur 20 mm x 20 mm, CX 10 mm x 32 cm, CX 9 mm x 28 mm, CX 8 mm x 28 cm) Post procedure she was in stable condition and discharge plans were scheduled.  She developed a hematoma and was re-admitted the next day 01/30/2014 due to large right groin hematoma/ Pseudoaneurysm.  She was taken to the OR by Dr. Myra Gianotti Exposure of right common femoral artery with repair of right femoral pseudoaneurysm.  Post op a JP drain was placed and D/C post op day 2.  The tight is soft, incision C/D I and she is stable for discharge.     Hospital Course:  [name] is a 80 y.o. female is S/P Right Procedure(s): REPAIR PSEUDOANEURYSM Extubated: POD # 0 Physical exam: Right groin  incision C/D/I  Thigh is softer, ecchymosis is dissipating  Distally DP pulse in palpable  Post-op wounds healing well Pt. Ambulating, voiding and taking PO diet without difficulty. Pt pain controlled with PO pain meds. Labs as below Complications:none  Consults:  Treatment Team:  Nada Libman, MD  Significant Diagnostic Studies: CBC Lab Results  Component Value Date   WBC 5.6 02/02/2014   HGB 8.1* 02/02/2014   HCT 23.0* 02/02/2014   MCV 92.4 02/02/2014   PLT 90* 02/02/2014    BMET    Component Value Date/Time   NA 136* 02/02/2014 0335   K 3.8 02/02/2014 0335   CL 102 02/02/2014 0335   CO2 22 02/02/2014 0335   GLUCOSE 123* 02/02/2014 0335   BUN 18 02/02/2014 0335   CREATININE 0.91 02/02/2014 0335   CALCIUM 8.9 02/02/2014 0335   GFRNONAA 59* 02/02/2014 0335   GFRAA 69* 02/02/2014 0335   COAG Lab Results  Component Value Date   INR 1.43 01/30/2014   INR 1.29 03/30/2010   INR 1.24 02/15/2010     Disposition:  Discharge to :Home    Medication List    ASK your doctor about these medications       bethanechol 50 MG tablet  Commonly known as:  URECHOLINE  Take 50 mg by mouth 2 (two) times daily.  CALCIUM 500 + D PO  Take 500 mg by mouth 2 (two) times daily.     cholecalciferol 1000 UNITS tablet  Commonly known as:  VITAMIN D  Take 2,000 Units by mouth daily.     clopidogrel 75 MG tablet  Commonly known as:  PLAVIX  Take 75 mg by mouth at bedtime.     CRANBERRY PLUS VITAMIN C 140-100-3 MG-MG-UNIT Caps  Generic drug:  Cranberry-Vitamin C-Vitamin E  Take 2,000 Units by mouth 2 (two) times daily.     ezetimibe 10 MG tablet  Commonly known as:  ZETIA  Take 10 mg by mouth at bedtime.     Fish Oil 1000 MG Caps  Take 1,000 mg by mouth 2 (two) times daily.     folic acid 400 MCG tablet  Commonly known as:  FOLVITE  Take 400 mcg by mouth daily.     labetalol 200 MG tablet  Commonly known as:  NORMODYNE  Take 300 mg by mouth 2 (two) times daily. Take 1 1/2  tablets twice daily     levothyroxine 50 MCG tablet  Commonly known as:  SYNTHROID, LEVOTHROID  Take 50 mcg by mouth daily before breakfast.     lisinopril 40 MG tablet  Commonly known as:  PRINIVIL,ZESTRIL  Take 40 mg by mouth daily.     nitrofurantoin 100 MG capsule  Commonly known as:  MACRODANTIN  Take 100 mg by mouth at bedtime.     psyllium 58.6 % packet  Commonly known as:  METAMUCIL  Take 1 packet by mouth daily.     tamsulosin 0.4 MG Caps capsule  Commonly known as:  FLOMAX  Take 0.4 mg by mouth 2 (two) times daily.       Verbal and written Discharge instructions given to the patient. Wound care per Discharge AVS F/U with Dr. Imogene Burn in 3 weeks  Signed: Lars Mage 02/03/2014, 7:45 AM

## 2014-02-03 NOTE — Discharge Summary (Signed)
I agree with the aboe  WElls Anik Wesch

## 2014-02-03 NOTE — Telephone Encounter (Addendum)
Message copied by Gena Fray on Wed Feb 03, 2014 10:45 AM ------      Message from: Peter Minium K      Created: Wed Feb 03, 2014  9:08 AM      Regarding: Schedule                   ----- Message -----         From: Ulyses Amor, PA-C         Sent: 02/03/2014   8:01 AM           To: Vvs Charge Pool            F/U with Bridgett Larsson in 2-3 weeks s/p angiogram/then Repare of pseudoaneurysm  Right groin ------  02/03/14: pt was already scheduled for 06/12- lm for pt to confirm, dpm

## 2014-02-03 NOTE — Progress Notes (Signed)
02/03/2014 12:29 PM Nursing note Discharge AVS form, medications already taken today and those due this evening given and explained to patient. Follow up appointments, incision site care and when to call MD reviewed. D/c iv line by NT. D/c tele. D/c home per orders.  [place]

## 2014-02-03 NOTE — Progress Notes (Signed)
Late entry: pt has order to d/c jp from right groin. D/c'd per protocol and no bleeding and no pain noted. Dressing applied per protocol. Will cont to monitor

## 2014-02-03 NOTE — Care Management Note (Signed)
    Page 1 of 1   02/03/2014     4:29:09 PM CARE MANAGEMENT NOTE 02/03/2014  Patient:  [name], [name]   Account Number:  [id number]  Date Initiated:  02/03/2014  Documentation initiated by:  Latham Kinzler  Subjective/Objective Assessment:   pt adm on 01/30/14 with PSA of femoral artery.  PTA, pt independent of ADLs.     Action/Plan:   For dc home today; denies any needs for home.   Anticipated DC Date:  02/03/2014   Anticipated DC Plan:  [place]  CM consult      Choice offered to / List presented to:             Status of service:  Completed, signed off Medicare Important Message given?  YES (If response is "NO", the following Medicare IM given date fields will be blank) Date Medicare IM given:  02/03/2014 Date Additional Medicare IM given:    Discharge Disposition:  HOME/SELF CARE  Per UR Regulation:  Reviewed for med. necessity/level of care/duration of stay  If discussed at [place] of Stay Meetings, dates discussed:    Comments:

## 2014-02-03 NOTE — Progress Notes (Addendum)
[facility] [place]  Subjective  - ready to go home doing well.   Objective 127/66 75 97.8 F (36.6 C) (Oral) 18 98%  Intake/Output Summary (Last 24 hours) at 02/03/14 0735 Last data filed at 02/03/14 0600  Gross per 24 hour  Intake    660 ml  Output    892 ml  Net   -232 ml    Right groin incision C/D/I Thigh is softer, ecchymosis is dissipating Distally DP pulse in palpable    Assessment/Planning: POD #3 #1: Exposure of right common femoral artery  #2: Repair of right femoral pseudoaneurysm D/C home f/u in 2 weeks   Ulyses Amor 02/03/2014 7:35 AM --  Laboratory Lab Results:  Recent Labs  02/01/14 0006 02/02/14 0335  WBC 5.8 5.6  HGB 8.4* 8.1*  HCT 24.3* 23.0*  PLT 86* 90*   BMET  Recent Labs  02/01/14 0006 02/02/14 0335  NA 134* 136*  K 4.9 3.8  CL 103 102  CO2 21 22  GLUCOSE 129* 123*  BUN 13 18  CREATININE 0.78 0.91  CALCIUM 8.6 8.9    COAG Lab Results  Component Value Date   INR 1.43 01/30/2014   INR 1.29 03/30/2010   INR 1.24 02/15/2010   No results found for this basename: PTT   Addendum  I have independently interviewed and examined the patient, and I agree with the physician assistant's findings.  No pulsatile mass in right groin.   No hematoma.  Echymosis as expected. R groin inc c/d/i.  LUQ without TTP.  H/H stable.  Pt asx from her anemia so I can justify the risk of transfusion.  This patient has no history of CAD.  I recommend increased intake of iron in foods over the next month to assist regeneration of blood.  She will follow up at her scheduled appointment in ~one month.    Adele Barthel, MD [facility] [place] Office: [phone] Pager: [phone]  02/03/2014, 8:31 AM

## 2014-02-09 ENCOUNTER — Telehealth: Payer: Self-pay

## 2014-02-09 NOTE — Telephone Encounter (Signed)
Phone call from pt.  C/o severe pain in the left side;  states "it hurts right where they showed me the spleen is located."  Denies any external signs of redness/ inflammation.  Denies any fever/ chills, or nausea or vomiting.  States the pain started yesterday morning, and comes and goes.  Reports had episode of pain in same site this morning, that lasted approx. 1 hr, and was very painful.  Denies pain at this time. States the right groin is without any signs of redness/ inflammation; states "the incision looks okay."  States she hasn't needed to take any pain medication, since she left the hospital.  Discussed with Dr. Kellie Simmering.  Recommends to contact her PCP, as the left sided pain is not likely related to her surgery on the right femoral area.  Notified pt. Of Dr. Evelena Leyden recommendations.  Verb. Understanding.

## 2014-02-18 ENCOUNTER — Encounter: Payer: Self-pay | Admitting: Vascular Surgery

## 2014-02-19 ENCOUNTER — Ambulatory Visit (INDEPENDENT_AMBULATORY_CARE_PROVIDER_SITE_OTHER): Payer: Medicare Other | Admitting: Vascular Surgery

## 2014-02-19 ENCOUNTER — Encounter: Payer: Self-pay | Admitting: Vascular Surgery

## 2014-02-19 VITALS — BP 149/77 | HR 70 | Resp 14 | Ht 67.0 in | Wt 197.0 lb

## 2014-02-19 DIAGNOSIS — I724 Aneurysm of artery of lower extremity: Secondary | ICD-10-CM

## 2014-02-19 DIAGNOSIS — I728 Aneurysm of other specified arteries: Secondary | ICD-10-CM

## 2014-02-19 NOTE — Progress Notes (Signed)
    Postoperative Visit   History of Present Illness  [name] is a 80 y.o. year old female who presents for postoperative follow-up for: Splenic artery embolization (Date: 6/00/45) complicated by R CFA PSA repeaired on 02/01/14.  The patient's wounds are healed.  The patient notes no sx.  The patient is able to complete their activities of daily living.  The patient's current symptoms are: none.  She has had one episode of severe LUQ pain which resolved after one day  For VQI Use Only  PRE-ADM LIVING: Home  AMB STATUS: Ambulatory  Physical Examination  Filed Vitals:   02/19/14 0901  BP: 149/77  Pulse: 70  Resp: 14   RLE: groin incision is healed, dermabond still on, well perfused R foot  ABD: soft, NTND, -G/R  Medical Decision Making  COURTLYN AKI is a 80 y.o. year old female who presents s/p splenic artery embolization and R fem PSA repair. I was peculiar to have a 2-3 day delay before presenting with the R fem PSA, also only a 6-Fr sheath was used for the embolization. I plan on trying to use mesenteric duplex to evaluate the splenic artery patency in 6 months.  I would prefer not repeat the CTA in this patient.  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  Regardless the main outflow artery from the aneurysm and the inflow and the aneurysm itself were all coiled.  Thank you for allowing [place] to participate in this patient's care.  Adele Barthel, MD [facility] [place] Office: [phone] Pager: [phone]  02/19/2014, 9:16 AM

## 2014-02-19 NOTE — Addendum Note (Signed)
Addended by: Mena Goes on: 02/19/2014 01:39 PM   Modules accepted: Orders

## 2014-02-24 DIAGNOSIS — R339 Retention of urine, unspecified: Secondary | ICD-10-CM | POA: Diagnosis not present

## 2014-02-24 DIAGNOSIS — K5900 Constipation, unspecified: Secondary | ICD-10-CM | POA: Diagnosis not present

## 2014-02-24 DIAGNOSIS — N390 Urinary tract infection, site not specified: Secondary | ICD-10-CM | POA: Diagnosis not present

## 2014-02-25 DIAGNOSIS — E785 Hyperlipidemia, unspecified: Secondary | ICD-10-CM | POA: Diagnosis not present

## 2014-02-25 DIAGNOSIS — Z1331 Encounter for screening for depression: Secondary | ICD-10-CM | POA: Diagnosis not present

## 2014-02-25 DIAGNOSIS — D539 Nutritional anemia, unspecified: Secondary | ICD-10-CM | POA: Diagnosis not present

## 2014-02-25 DIAGNOSIS — Z9181 History of falling: Secondary | ICD-10-CM | POA: Diagnosis not present

## 2014-02-25 DIAGNOSIS — E039 Hypothyroidism, unspecified: Secondary | ICD-10-CM | POA: Diagnosis not present

## 2014-02-25 DIAGNOSIS — Z6830 Body mass index (BMI) 30.0-30.9, adult: Secondary | ICD-10-CM | POA: Diagnosis not present

## 2014-02-25 DIAGNOSIS — I10 Essential (primary) hypertension: Secondary | ICD-10-CM | POA: Diagnosis not present

## 2014-02-25 DIAGNOSIS — I6789 Other cerebrovascular disease: Secondary | ICD-10-CM | POA: Diagnosis not present

## 2014-02-25 DIAGNOSIS — D509 Iron deficiency anemia, unspecified: Secondary | ICD-10-CM | POA: Diagnosis not present

## 2014-02-27 DIAGNOSIS — L089 Local infection of the skin and subcutaneous tissue, unspecified: Secondary | ICD-10-CM | POA: Diagnosis not present

## 2014-03-04 DIAGNOSIS — Z1231 Encounter for screening mammogram for malignant neoplasm of breast: Secondary | ICD-10-CM | POA: Diagnosis not present

## 2014-03-22 DIAGNOSIS — S72143A Displaced intertrochanteric fracture of unspecified femur, initial encounter for closed fracture: Secondary | ICD-10-CM | POA: Diagnosis not present

## 2014-03-22 DIAGNOSIS — E7800 Pure hypercholesterolemia, unspecified: Secondary | ICD-10-CM | POA: Diagnosis not present

## 2014-03-22 DIAGNOSIS — R6889 Other general symptoms and signs: Secondary | ICD-10-CM | POA: Diagnosis not present

## 2014-03-22 DIAGNOSIS — S298XXA Other specified injuries of thorax, initial encounter: Secondary | ICD-10-CM | POA: Diagnosis not present

## 2014-03-22 DIAGNOSIS — W010XXA Fall on same level from slipping, tripping and stumbling without subsequent striking against object, initial encounter: Secondary | ICD-10-CM | POA: Diagnosis not present

## 2014-03-22 DIAGNOSIS — M25559 Pain in unspecified hip: Secondary | ICD-10-CM | POA: Diagnosis not present

## 2014-03-22 DIAGNOSIS — Z96659 Presence of unspecified artificial knee joint: Secondary | ICD-10-CM | POA: Diagnosis not present

## 2014-03-22 DIAGNOSIS — I10 Essential (primary) hypertension: Secondary | ICD-10-CM | POA: Diagnosis not present

## 2014-03-22 DIAGNOSIS — Z79899 Other long term (current) drug therapy: Secondary | ICD-10-CM | POA: Diagnosis not present

## 2014-03-23 ENCOUNTER — Encounter (HOSPITAL_COMMUNITY): Payer: Self-pay | Admitting: *Deleted

## 2014-03-23 ENCOUNTER — Inpatient Hospital Stay (HOSPITAL_COMMUNITY): Payer: Medicare Other

## 2014-03-23 ENCOUNTER — Encounter (HOSPITAL_COMMUNITY)
Admission: AD | Disposition: A | Discharge status: Skilled Nursing Facility | Payer: Self-pay | Source: Other Acute Inpatient Hospital | Attending: Internal Medicine

## 2014-03-23 ENCOUNTER — Encounter (HOSPITAL_COMMUNITY): Payer: Medicare Other | Admitting: Certified Registered"

## 2014-03-23 ENCOUNTER — Inpatient Hospital Stay (HOSPITAL_COMMUNITY): Payer: Medicare Other | Admitting: Certified Registered"

## 2014-03-23 ENCOUNTER — Inpatient Hospital Stay (HOSPITAL_COMMUNITY)
Admission: AD | Admit: 2014-03-23 | Discharge: 2014-03-27 | DRG: 481 | Disposition: A | Discharge status: Skilled Nursing Facility | Payer: Medicare Other | Source: Other Acute Inpatient Hospital | Attending: Internal Medicine | Admitting: Internal Medicine

## 2014-03-23 DIAGNOSIS — S72143A Displaced intertrochanteric fracture of unspecified femur, initial encounter for closed fracture: Principal | ICD-10-CM

## 2014-03-23 DIAGNOSIS — Z87440 Personal history of urinary (tract) infections: Secondary | ICD-10-CM | POA: Diagnosis not present

## 2014-03-23 DIAGNOSIS — D696 Thrombocytopenia, unspecified: Secondary | ICD-10-CM | POA: Diagnosis present

## 2014-03-23 DIAGNOSIS — Z96659 Presence of unspecified artificial knee joint: Secondary | ICD-10-CM | POA: Diagnosis not present

## 2014-03-23 DIAGNOSIS — Z01818 Encounter for other preprocedural examination: Secondary | ICD-10-CM | POA: Diagnosis not present

## 2014-03-23 DIAGNOSIS — M25559 Pain in unspecified hip: Secondary | ICD-10-CM | POA: Diagnosis not present

## 2014-03-23 DIAGNOSIS — Z79899 Other long term (current) drug therapy: Secondary | ICD-10-CM

## 2014-03-23 DIAGNOSIS — I10 Essential (primary) hypertension: Secondary | ICD-10-CM | POA: Diagnosis present

## 2014-03-23 DIAGNOSIS — J438 Other emphysema: Secondary | ICD-10-CM | POA: Diagnosis not present

## 2014-03-23 DIAGNOSIS — E7800 Pure hypercholesterolemia, unspecified: Secondary | ICD-10-CM | POA: Diagnosis present

## 2014-03-23 DIAGNOSIS — Z8673 Personal history of transient ischemic attack (TIA), and cerebral infarction without residual deficits: Secondary | ICD-10-CM

## 2014-03-23 DIAGNOSIS — E039 Hypothyroidism, unspecified: Secondary | ICD-10-CM | POA: Diagnosis present

## 2014-03-23 DIAGNOSIS — E785 Hyperlipidemia, unspecified: Secondary | ICD-10-CM | POA: Diagnosis present

## 2014-03-23 DIAGNOSIS — D62 Acute posthemorrhagic anemia: Secondary | ICD-10-CM | POA: Diagnosis not present

## 2014-03-23 DIAGNOSIS — Y92009 Unspecified place in unspecified non-institutional (private) residence as the place of occurrence of the external cause: Secondary | ICD-10-CM

## 2014-03-23 DIAGNOSIS — D649 Anemia, unspecified: Secondary | ICD-10-CM | POA: Diagnosis not present

## 2014-03-23 DIAGNOSIS — K5900 Constipation, unspecified: Secondary | ICD-10-CM | POA: Diagnosis not present

## 2014-03-23 DIAGNOSIS — S72141A Displaced intertrochanteric fracture of right femur, initial encounter for closed fracture: Secondary | ICD-10-CM

## 2014-03-23 DIAGNOSIS — M129 Arthropathy, unspecified: Secondary | ICD-10-CM | POA: Diagnosis present

## 2014-03-23 DIAGNOSIS — Z886 Allergy status to analgesic agent status: Secondary | ICD-10-CM | POA: Diagnosis not present

## 2014-03-23 DIAGNOSIS — M1990 Unspecified osteoarthritis, unspecified site: Secondary | ICD-10-CM | POA: Diagnosis not present

## 2014-03-23 DIAGNOSIS — Z7902 Long term (current) use of antithrombotics/antiplatelets: Secondary | ICD-10-CM

## 2014-03-23 DIAGNOSIS — N390 Urinary tract infection, site not specified: Secondary | ICD-10-CM

## 2014-03-23 DIAGNOSIS — W010XXA Fall on same level from slipping, tripping and stumbling without subsequent striking against object, initial encounter: Secondary | ICD-10-CM | POA: Diagnosis present

## 2014-03-23 DIAGNOSIS — S72009D Fracture of unspecified part of neck of unspecified femur, subsequent encounter for closed fracture with routine healing: Secondary | ICD-10-CM | POA: Diagnosis not present

## 2014-03-23 DIAGNOSIS — S72109A Unspecified trochanteric fracture of unspecified femur, initial encounter for closed fracture: Secondary | ICD-10-CM | POA: Diagnosis not present

## 2014-03-23 DIAGNOSIS — S72001A Fracture of unspecified part of neck of right femur, initial encounter for closed fracture: Secondary | ICD-10-CM

## 2014-03-23 DIAGNOSIS — D500 Iron deficiency anemia secondary to blood loss (chronic): Secondary | ICD-10-CM | POA: Diagnosis not present

## 2014-03-23 DIAGNOSIS — S72009A Fracture of unspecified part of neck of unspecified femur, initial encounter for closed fracture: Secondary | ICD-10-CM | POA: Diagnosis not present

## 2014-03-23 HISTORY — PX: FEMUR IM NAIL: SHX1597

## 2014-03-23 LAB — COMPREHENSIVE METABOLIC PANEL
ALT: 21 U/L (ref 0–35)
AST: 28 U/L (ref 0–37)
Albumin: 3.9 g/dL (ref 3.5–5.2)
Alkaline Phosphatase: 73 U/L (ref 39–117)
Anion gap: 13 (ref 5–15)
BUN: 13 mg/dL (ref 6–23)
CALCIUM: 9.8 mg/dL (ref 8.4–10.5)
CO2: 22 meq/L (ref 19–32)
CREATININE: 0.94 mg/dL (ref 0.50–1.10)
Chloride: 101 mEq/L (ref 96–112)
GFR, EST AFRICAN AMERICAN: 66 mL/min — AB (ref >90)
GFR, EST NON AFRICAN AMERICAN: 57 mL/min — AB (ref >90)
Glucose, Bld: 140 mg/dL — ABNORMAL HIGH (ref 70–99)
Potassium: 4.4 mEq/L (ref 3.7–5.3)
SODIUM: 136 meq/L — AB (ref 137–147)
Total Bilirubin: 0.8 mg/dL (ref 0.3–1.2)
Total Protein: 6.9 g/dL (ref 6.0–8.3)

## 2014-03-23 LAB — URINALYSIS, ROUTINE W REFLEX MICROSCOPIC
Bilirubin Urine: NEGATIVE
Glucose, UA: NEGATIVE mg/dL
Hgb urine dipstick: NEGATIVE
Ketones, ur: NEGATIVE mg/dL
Nitrite: NEGATIVE
PH: 8 (ref 5.0–8.0)
Protein, ur: NEGATIVE mg/dL
Specific Gravity, Urine: 1.011 (ref 1.005–1.030)
Urobilinogen, UA: 0.2 mg/dL (ref 0.0–1.0)

## 2014-03-23 LAB — SURGICAL PCR SCREEN
MRSA, PCR: NEGATIVE
Staphylococcus aureus: POSITIVE — AB

## 2014-03-23 LAB — CBC WITH DIFFERENTIAL/PLATELET
BASOS PCT: 0 % (ref 0–1)
Basophils Absolute: 0 10*3/uL (ref 0.0–0.1)
EOS ABS: 0.1 10*3/uL (ref 0.0–0.7)
Eosinophils Relative: 2 % (ref 0–5)
HCT: 33.9 % — ABNORMAL LOW (ref 36.0–46.0)
Hemoglobin: 11.4 g/dL — ABNORMAL LOW (ref 12.0–15.0)
LYMPHS ABS: 0.7 10*3/uL (ref 0.7–4.0)
Lymphocytes Relative: 11 % — ABNORMAL LOW (ref 12–46)
MCH: 30.7 pg (ref 26.0–34.0)
MCHC: 33.6 g/dL (ref 30.0–36.0)
MCV: 91.4 fL (ref 78.0–100.0)
Monocytes Absolute: 0.7 10*3/uL (ref 0.1–1.0)
Monocytes Relative: 11 % (ref 3–12)
NEUTROS PCT: 76 % (ref 43–77)
Neutro Abs: 4.8 10*3/uL (ref 1.7–7.7)
Platelets: 88 10*3/uL — ABNORMAL LOW (ref 150–400)
RBC: 3.71 MIL/uL — AB (ref 3.87–5.11)
RDW: 12.5 % (ref 11.5–15.5)
WBC: 6.3 10*3/uL (ref 4.0–10.5)

## 2014-03-23 LAB — PROTIME-INR
INR: 1.25 (ref 0.00–1.49)
PROTHROMBIN TIME: 15.7 s — AB (ref 11.6–15.2)

## 2014-03-23 LAB — TROPONIN I

## 2014-03-23 LAB — VITAMIN D 25 HYDROXY (VIT D DEFICIENCY, FRACTURES): VIT D 25 HYDROXY: 25 ng/mL — AB (ref 30–89)

## 2014-03-23 LAB — URINE MICROSCOPIC-ADD ON

## 2014-03-23 SURGERY — INSERTION, INTRAMEDULLARY ROD, FEMUR
Anesthesia: General | Site: Hip | Laterality: Right

## 2014-03-23 MED ORDER — PROPOFOL 10 MG/ML IV BOLUS
INTRAVENOUS | Status: AC
  Filled 2014-03-23: qty 20

## 2014-03-23 MED ORDER — HYDROCODONE-ACETAMINOPHEN 5-325 MG PO TABS
1.0000 | ORAL_TABLET | Freq: Four times a day (QID) | ORAL | Status: DC | PRN
  Administered 2014-03-23 – 2014-03-24 (×3): 2 via ORAL
  Filled 2014-03-23 (×3): qty 2

## 2014-03-23 MED ORDER — CEFAZOLIN SODIUM-DEXTROSE 2-3 GM-% IV SOLR
INTRAVENOUS | Status: AC
  Filled 2014-03-23: qty 50

## 2014-03-23 MED ORDER — FENTANYL CITRATE 0.05 MG/ML IJ SOLN
INTRAMUSCULAR | Status: AC
  Filled 2014-03-23: qty 2

## 2014-03-23 MED ORDER — MORPHINE SULFATE 2 MG/ML IJ SOLN
0.5000 mg | INTRAMUSCULAR | Status: DC | PRN
  Administered 2014-03-23 – 2014-03-24 (×3): 0.5 mg via INTRAVENOUS
  Filled 2014-03-23 (×3): qty 1

## 2014-03-23 MED ORDER — MEPERIDINE HCL 50 MG/ML IJ SOLN: 6.2500 mg | INTRAMUSCULAR | Status: DC | PRN

## 2014-03-23 MED ORDER — MIDAZOLAM HCL 5 MG/5ML IJ SOLN
INTRAMUSCULAR | Status: DC | PRN
  Administered 2014-03-23 (×2): 1 mg via INTRAVENOUS

## 2014-03-23 MED ORDER — POLYETHYLENE GLYCOL 3350 17 G PO PACK
17.0000 g | PACK | Freq: Every day | ORAL | Status: DC | PRN
  Administered 2014-03-25 – 2014-03-26 (×2): 17 g via ORAL

## 2014-03-23 MED ORDER — LEVOTHYROXINE SODIUM 100 MCG IV SOLR
25.0000 ug | Freq: Every day | INTRAVENOUS | Status: DC
  Administered 2014-03-23 – 2014-03-24 (×2): 25 ug via INTRAVENOUS
  Filled 2014-03-23 (×3): qty 5

## 2014-03-23 MED ORDER — MUPIROCIN 2 % EX OINT
TOPICAL_OINTMENT | Freq: Two times a day (BID) | CUTANEOUS | Status: DC
  Administered 2014-03-24: 21:00:00 via NASAL
  Administered 2014-03-24: 1 via NASAL
  Administered 2014-03-25 – 2014-03-26 (×4): via NASAL
  Filled 2014-03-23: qty 22

## 2014-03-23 MED ORDER — SODIUM CHLORIDE 0.9 % IV SOLN
INTRAVENOUS | Status: DC
  Administered 2014-03-23 – 2014-03-24 (×2): via INTRAVENOUS

## 2014-03-23 MED ORDER — FENTANYL CITRATE 0.05 MG/ML IJ SOLN
INTRAMUSCULAR | Status: DC | PRN
  Administered 2014-03-23: 100 ug via INTRAVENOUS
  Administered 2014-03-23 (×2): 50 ug via INTRAVENOUS

## 2014-03-23 MED ORDER — FENTANYL CITRATE 0.05 MG/ML IJ SOLN: 25.0000 ug | INTRAMUSCULAR | Status: DC | PRN

## 2014-03-23 MED ORDER — ONDANSETRON HCL 4 MG/2ML IJ SOLN
INTRAMUSCULAR | Status: DC | PRN
  Administered 2014-03-23: 4 mg via INTRAVENOUS

## 2014-03-23 MED ORDER — CEFAZOLIN SODIUM-DEXTROSE 2-3 GM-% IV SOLR
2.0000 g | Freq: Four times a day (QID) | INTRAVENOUS | Status: AC
  Administered 2014-03-24 (×2): 2 g via INTRAVENOUS
  Filled 2014-03-23 (×2): qty 50

## 2014-03-23 MED ORDER — HYDROMORPHONE HCL PF 1 MG/ML IJ SOLN
INTRAMUSCULAR | Status: DC | PRN
  Administered 2014-03-23 (×2): 1 mg via INTRAVENOUS

## 2014-03-23 MED ORDER — FERROUS SULFATE 325 (65 FE) MG PO TABS
325.0000 mg | ORAL_TABLET | Freq: Three times a day (TID) | ORAL | Status: DC
  Administered 2014-03-24 – 2014-03-26 (×9): 325 mg via ORAL
  Filled 2014-03-23 (×13): qty 1

## 2014-03-23 MED ORDER — HYDROMORPHONE HCL PF 2 MG/ML IJ SOLN
INTRAMUSCULAR | Status: AC
  Filled 2014-03-23: qty 1

## 2014-03-23 MED ORDER — LACTATED RINGERS IV SOLN
INTRAVENOUS | Status: DC | PRN
  Administered 2014-03-23: 20:00:00 via INTRAVENOUS

## 2014-03-23 MED ORDER — DEXTROSE 5 % IV SOLN
1.0000 g | Freq: Every day | INTRAVENOUS | Status: DC
  Administered 2014-03-24 – 2014-03-26 (×3): 1 g via INTRAVENOUS
  Filled 2014-03-23 (×5): qty 10

## 2014-03-23 MED ORDER — PROMETHAZINE HCL 25 MG/ML IJ SOLN: 6.2500 mg | INTRAMUSCULAR | Status: DC | PRN

## 2014-03-23 MED ORDER — CEFAZOLIN SODIUM-DEXTROSE 2-3 GM-% IV SOLR
2.0000 g | INTRAVENOUS | Status: AC
  Administered 2014-03-23: 2 g via INTRAVENOUS

## 2014-03-23 MED ORDER — MENTHOL 3 MG MT LOZG
1.0000 | LOZENGE | OROMUCOSAL | Status: DC | PRN
  Filled 2014-03-23: qty 9

## 2014-03-23 MED ORDER — METOCLOPRAMIDE HCL 5 MG/ML IJ SOLN
5.0000 mg | Freq: Three times a day (TID) | INTRAMUSCULAR | Status: DC | PRN
Start: 2014-03-23 — End: 2014-03-27

## 2014-03-23 MED ORDER — ENOXAPARIN SODIUM 40 MG/0.4ML ~~LOC~~ SOLN
40.0000 mg | Freq: Every day | SUBCUTANEOUS | Status: DC
  Administered 2014-03-24 – 2014-03-26 (×3): 40 mg via SUBCUTANEOUS
  Filled 2014-03-23 (×4): qty 0.4

## 2014-03-23 MED ORDER — ENOXAPARIN SODIUM 40 MG/0.4ML ~~LOC~~ SOLN
40.0000 mg | SUBCUTANEOUS | Status: DC
  Filled 2014-03-23: qty 0.4

## 2014-03-23 MED ORDER — SODIUM CHLORIDE 0.9 % IV SOLN
INTRAVENOUS | Status: DC
  Administered 2014-03-23: 08:00:00 via INTRAVENOUS

## 2014-03-23 MED ORDER — ONDANSETRON HCL 4 MG/2ML IJ SOLN
4.0000 mg | Freq: Four times a day (QID) | INTRAMUSCULAR | Status: DC | PRN
  Administered 2014-03-25: 4 mg via INTRAVENOUS
  Filled 2014-03-23: qty 2

## 2014-03-23 MED ORDER — SUCCINYLCHOLINE CHLORIDE 20 MG/ML IJ SOLN
INTRAMUSCULAR | Status: DC | PRN
  Administered 2014-03-23: 100 mg via INTRAVENOUS

## 2014-03-23 MED ORDER — LABETALOL HCL 5 MG/ML IV SOLN
10.0000 mg | INTRAVENOUS | Status: DC | PRN
  Filled 2014-03-23: qty 4

## 2014-03-23 MED ORDER — ONDANSETRON HCL 4 MG PO TABS: 4.0000 mg | ORAL_TABLET | Freq: Four times a day (QID) | ORAL | Status: DC | PRN

## 2014-03-23 MED ORDER — DOCUSATE SODIUM 100 MG PO CAPS
100.0000 mg | ORAL_CAPSULE | Freq: Two times a day (BID) | ORAL | Status: DC
  Administered 2014-03-23 – 2014-03-26 (×7): 100 mg via ORAL

## 2014-03-23 MED ORDER — PHENOL 1.4 % MT LIQD
1.0000 | OROMUCOSAL | Status: DC | PRN
  Filled 2014-03-23: qty 177

## 2014-03-23 MED ORDER — 0.9 % SODIUM CHLORIDE (POUR BTL) OPTIME
TOPICAL | Status: DC | PRN
  Administered 2014-03-23: 1000 mL

## 2014-03-23 MED ORDER — EPHEDRINE SULFATE 50 MG/ML IJ SOLN
INTRAMUSCULAR | Status: DC | PRN
  Administered 2014-03-23: 10 mg via INTRAVENOUS

## 2014-03-23 MED ORDER — METOCLOPRAMIDE HCL 10 MG PO TABS: 5.0000 mg | ORAL_TABLET | Freq: Three times a day (TID) | ORAL | Status: DC | PRN

## 2014-03-23 MED ORDER — PROPOFOL 10 MG/ML IV BOLUS
INTRAVENOUS | Status: DC | PRN
  Administered 2014-03-23: 150 mg via INTRAVENOUS

## 2014-03-23 MED ORDER — MIDAZOLAM HCL 2 MG/2ML IJ SOLN
INTRAMUSCULAR | Status: AC
  Filled 2014-03-23: qty 2

## 2014-03-23 MED ORDER — ONDANSETRON HCL 4 MG/2ML IJ SOLN
INTRAMUSCULAR | Status: AC
  Filled 2014-03-23: qty 2

## 2014-03-23 SURGICAL SUPPLY — 42 items
BAG ZIPLOCK 12X15 (MISCELLANEOUS) IMPLANT
BIT DRILL CANN LG 4.3MM (BIT) ×1 IMPLANT
BNDG GAUZE ELAST 4 BULKY (GAUZE/BANDAGES/DRESSINGS) ×3 IMPLANT
DRAPE INCISE IOBAN 66X45 STRL (DRAPES) ×3 IMPLANT
DRAPE STERI IOBAN 125X83 (DRAPES) ×3 IMPLANT
DRILL BIT CANN LG 4.3MM (BIT) ×3
DRSG AQUACEL AG ADV 3.5X 4 (GAUZE/BANDAGES/DRESSINGS) IMPLANT
DRSG AQUACEL AG ADV 3.5X 6 (GAUZE/BANDAGES/DRESSINGS) IMPLANT
DRSG MEPILEX BORDER 4X12 (GAUZE/BANDAGES/DRESSINGS) ×3 IMPLANT
DRSG PAD ABDOMINAL 8X10 ST (GAUZE/BANDAGES/DRESSINGS) ×6 IMPLANT
DURAPREP 26ML APPLICATOR (WOUND CARE) ×3 IMPLANT
ELECT REM PT RETURN 9FT ADLT (ELECTROSURGICAL) ×3
ELECTRODE REM PT RTRN 9FT ADLT (ELECTROSURGICAL) ×1 IMPLANT
GAUZE SPONGE 4X4 12PLY STRL (GAUZE/BANDAGES/DRESSINGS) IMPLANT
GLOVE BIOGEL PI IND STRL 7.0 (GLOVE) ×1 IMPLANT
GLOVE BIOGEL PI IND STRL 7.5 (GLOVE) ×1 IMPLANT
GLOVE BIOGEL PI IND STRL 8 (GLOVE) IMPLANT
GLOVE BIOGEL PI INDICATOR 7.0 (GLOVE) ×2
GLOVE BIOGEL PI INDICATOR 7.5 (GLOVE) ×2
GLOVE BIOGEL PI INDICATOR 8 (GLOVE)
GLOVE ECLIPSE 8.0 STRL XLNG CF (GLOVE) IMPLANT
GLOVE ORTHO TXT STRL SZ7.5 (GLOVE) ×6 IMPLANT
GLOVE SURG ORTHO 8.0 STRL STRW (GLOVE) IMPLANT
GLOVE SURG SS PI 7.0 STRL IVOR (GLOVE) ×3 IMPLANT
GOWN SPEC L3 XXLG W/TWL (GOWN DISPOSABLE) IMPLANT
GOWN STRL REUS W/TWL LRG LVL3 (GOWN DISPOSABLE) ×3 IMPLANT
GOWN STRL REUS W/TWL LRG LVL4 (GOWN DISPOSABLE) ×6 IMPLANT
GUIDEPIN 3.2X17.5 THRD DISP (PIN) ×3 IMPLANT
HFN LAG SCREW 10.5MM X 115MM (Orthopedic Implant) ×3 IMPLANT
KIT BASIN OR (CUSTOM PROCEDURE TRAY) ×3 IMPLANT
MANIFOLD NEPTUNE II (INSTRUMENTS) IMPLANT
NAIL HIP FRACT 130D 11X180 (Screw) ×3 IMPLANT
PACK GENERAL/GYN (CUSTOM PROCEDURE TRAY) ×3 IMPLANT
POSITIONER SURGICAL ARM (MISCELLANEOUS) ×6 IMPLANT
SCREW BONE CORTICAL 5.0X36 (Screw) ×3 IMPLANT
STAPLER VISISTAT 35W (STAPLE) ×3 IMPLANT
SUT VIC AB 1 CT1 27 (SUTURE) ×2
SUT VIC AB 1 CT1 27XBRD ANTBC (SUTURE) ×1 IMPLANT
SUT VIC AB 2-0 CT1 27 (SUTURE) ×4
SUT VIC AB 2-0 CT1 27XBRD (SUTURE) ×2 IMPLANT
TAPE STRIPS DRAPE STRL (GAUZE/BANDAGES/DRESSINGS) ×3 IMPLANT
TOWEL OR 17X26 10 PK STRL BLUE (TOWEL DISPOSABLE) ×6 IMPLANT

## 2014-03-23 NOTE — Transfer of Care (Signed)
Immediate Anesthesia Transfer of Care Note  Patient: [name]  Procedure(s) Performed: Procedure(s) (LRB): INTRAMEDULLARY (IM) NAIL FEMORAL (Right)  Patient Location: PACU  Anesthesia Type: General  Level of Consciousness: sedated, patient cooperative and responds to stimulation  Airway & Oxygen Therapy: Patient Spontanous Breathing and Patient connected to face mask oxgen  Post-op Assessment: Report given to PACU RN and Post -op Vital signs reviewed and stable  Post vital signs: Reviewed and stable  Complications: No apparent anesthesia complications

## 2014-03-23 NOTE — Progress Notes (Signed)
Clinical Social Work Department BRIEF PSYCHOSOCIAL ASSESSMENT 03/23/2014  Patient:  [name], [name]     Account Number:  [id number]     Admit date:  03/23/2014  Clinical Social Worker:  Lacie Scotts  Date/Time:  03/23/2014 02:04 PM  Referred by:  Physician  Date Referred:  03/23/2014 Referred for  SNF Placement   Other Referral:   Interview type:  Patient Other interview type:    PSYCHOSOCIAL DATA Living Status:  FAMILY Admitted from facility:   Level of care:   Primary support name:  Aviva Kluver Primary support relationship to patient:  CHILD, ADULT Degree of support available:   supportive    CURRENT CONCERNS Current Concerns  Post-Acute Placement   Other Concerns:    SOCIAL WORK ASSESSMENT / PLAN Pt is a 80 yr old female living at home with her son prior to hospitalization. Pt fell yesterday which resulted in a hip fracture. Surgery is scheduled for today. CSW met with pt to assist with d/c planning. Pt would like to return home, if possible, following hospital d/c. She will consider ST Rehab, if needed. Pt has been to [facility] in [place] in the past. CSW has contacted CSW and SNF is able to offer a ST Rehab placement , if needed.   Assessment/plan status:  Psychosocial Support/Ongoing Assessment of Needs Other assessment/ plan:   Information/referral to community resources:   Insurance coverage for SNF and ambulance transport reviewed.    PATIENT'S/FAMILY'S RESPONSE TO PLAN OF CARE: Pt states she will be happy when her surgery has been completed. " I want to go home but I'll consider [facility] if I need rehab." D/C planning is ongoing. Support / reassurance provided.   Werner Lean LCSW

## 2014-03-23 NOTE — Anesthesia Preprocedure Evaluation (Signed)
Anesthesia Evaluation  Patient identified by MRN, date of birth, ID band Patient awake    Reviewed: Allergy & Precautions, H&P , NPO status , Patient's Chart, lab work & pertinent test results  Airway Mallampati: II TM Distance: >3 FB Neck ROM: Full    Dental no notable dental hx. (+) Partial Upper   Pulmonary neg pulmonary ROS,  breath sounds clear to auscultation  Pulmonary exam normal       Cardiovascular hypertension, Pt. on medications Rhythm:Regular Rate:Normal     Neuro/Psych CVA, No Residual Symptoms negative neurological ROS  negative psych ROS   GI/Hepatic negative GI ROS, Neg liver ROS,   Endo/Other  negative endocrine ROS  Renal/GU negative Renal ROS  negative genitourinary   Musculoskeletal negative musculoskeletal ROS (+)   Abdominal   Peds negative pediatric ROS (+)  Hematology  (+) anemia , Thrombocytopenia plt 88k   Anesthesia Other Findings   Reproductive/Obstetrics negative OB ROS                           Anesthesia Physical Anesthesia Plan  ASA: II  Anesthesia Plan: General   Post-op Pain Management:    Induction: Intravenous  Airway Management Planned: Oral ETT  Additional Equipment:   Intra-op Plan:   Post-operative Plan: Extubation in OR  Informed Consent: I have reviewed the patients History and Physical, chart, labs and discussed the procedure including the risks, benefits and alternatives for the proposed anesthesia with the patient or authorized representative who has indicated his/her understanding and acceptance.   Dental advisory given  Plan Discussed with: CRNA  Anesthesia Plan Comments:         Anesthesia Quick Evaluation

## 2014-03-23 NOTE — Progress Notes (Signed)
INITIAL NUTRITION ASSESSMENT  DOCUMENTATION CODES Per approved criteria  -Not Applicable   INTERVENTION: -Recommend one strawberry Ensure Complete daily w/breakfast -Reviewed importance of nutrition for recovery/post op -Will continue to monitor  NUTRITION DIAGNOSIS: Increased nutrient needs (protein/kcal) related to increased demand for nutrients as evidenced by s/p hip fracture.   Goal: Pt to meet >/= 90% of their estimated nutrition needs    Monitor:  Diet order, total protein/energy intake, labs, weights  Reason for Assessment: Consult to Assess (hip fracture protocol)  80 y.o. female  Admitting Dx: Intertrochanteric fracture of right hip  ASSESSMENT: [name] is a 80 y.o. female history of hypertension, CVA, hyperlipidemia had a fall at her house when she tripped on her rug. Patient denies having hit her head or loosing consciousness. X-rays show right intertrochanteric fracture and patient was transferred to [facility] at patient's request  -Pt reported an intentional 10 lbs weight loss by decreasing intake of sweets and sugary beverages. Weight loss has been monitored by her PCP and has been occuring over past 2 months -Diet recall indicates pt consumes two meals/daily. Will consume either a strawberry Ensure, a breakfast sandwich, or eggs and grits for breakfast and a balanced dinner meal. Will occasionally snack on peanut butter crackers in the afternoon -Pt NPO for IM nail R femur. Reviewed importance of nutrition for post op recovery. Pt verbalized understanding and tries to incorporate heart healthy protein sources into diet. Will order strawberry Ensure once daily w/breakfast  Height: Ht Readings from Last 1 Encounters:  03/23/14 5\' 7"  (1.702 m)    Weight: Wt Readings from Last 1 Encounters:  03/23/14 196 lb 13.9 oz (89.3 kg)    Ideal Body Weight: 135 lbs  % Ideal Body Weight: 145%  Wt Readings from Last 10 Encounters:  03/23/14 196 lb  13.9 oz (89.3 kg)  03/23/14 196 lb 13.9 oz (89.3 kg)  02/19/14 197 lb (89.359 kg)  01/31/14 201 lb 1 oz (91.2 kg)  01/31/14 201 lb 1 oz (91.2 kg)  01/28/14 198 lb (89.812 kg)  01/28/14 198 lb (89.812 kg)  12/31/13 199 lb (90.266 kg)  12/04/13 201 lb (91.173 kg)    Usual Body Weight: 205 lbs  % Usual Body Weight: 96%  BMI:  Body mass index is 30.83 kg/(m^2).  Estimated Nutritional Needs: Kcal: 1700-1900 Protein: 85-95 gram Fluid: >/= 1700 ml/daily  Skin: WDL  Diet Order: NPO  EDUCATION NEEDS: -Education needs addressed  No intake or output data in the 24 hours ending 03/23/14 1008  Last BM: 7/13   Labs:   Recent Labs Lab 03/23/14 0509  NA 136*  K 4.4  CL 101  CO2 22  BUN 13  CREATININE 0.94  CALCIUM 9.8  GLUCOSE 140*    CBG (last 3)  No results found for this basename: GLUCAP,  in the last 72 hours  Scheduled Meds: . cefTRIAXone (ROCEPHIN)  IV  1 g Intravenous QHS  . enoxaparin (LOVENOX) injection  40 mg Subcutaneous Q24H  . levothyroxine  25 mcg Intravenous QAC breakfast    Continuous Infusions: . sodium chloride 10 mL/hr at 03/23/14 0755    Past Medical History  Diagnosis Date  . Hypertension   . High cholesterol   . Stroke 1990's    "mild", denies residual on 01/28/2014  . Pneumonia     "maybe in my teens"  . Hypothyroidism   . Anemia   . History of blood transfusion     "S/P knee OR"  .  Arthritis     "was in my knees"  . Recurrent UTI (urinary tract infection)     Past Surgical History  Procedure Laterality Date  . Muscle biopsy Left 1990's    "knot biopsy from carrying mail bag for years"  . Bladder suspension  2012  . Splenic artery embolization  01/28/2014  . Joint replacement    . Total knee arthroplasty Bilateral 2012  . Vaginal hysterectomy    . Revison of arteriovenous fistula Right 01/31/2014    Procedure: REPAIR PSEUDOANEURYSM;  Surgeon: Serafina Mitchell, MD;  Location: [facility] OR;  Service: Vascular;  Laterality: Right;   Repair of Femoral Pseudoaneurysm.    Atlee Abide MS RD LDN Clinical Dietitian [phone]

## 2014-03-23 NOTE — H&P (Signed)
[facility] History and Physical  ROSMERI Woodard HKV:[id number] DOB: 1934-03-30 DOA: 03/23/2014  Referring physician: Patient was transferred from [name], [facility]. PCP: Paulina Fusi, MD   Chief Complaint: Fall with right hip pain.  HPI: [name] is a 80 y.o. female history of hypertension, CVA, hyperlipidemia had a fall at her house when she tripped on her rug. Patient denies having hit her head or loosing consciousness. X-rays show right intertrochanteric fracture and patient was transferred to [facility] at patient's request. This patient denies any chest pain or shortness of breath. Chest x-ray is done shows chronic interstitial markings. Cardiac markers liver function tests and metabolic panel were largely unremarkable. Patient's CBC showed chronic anemia and thrombocytopenia with urinalysis showing possible urinary tract infection. Patient was admitted last May for splenic artery aneurysm.   Review of Systems: As presented in the history of presenting illness, rest negative.  Past Medical History  Diagnosis Date  . Hypertension   . High cholesterol   . Stroke 1990's    "mild", denies residual on 01/28/2014  . Pneumonia     "maybe in my teens"  . Hypothyroidism   . Anemia   . History of blood transfusion     "S/P knee OR"  . Arthritis     "was in my knees"  . Recurrent UTI (urinary tract infection)    Past Surgical History  Procedure Laterality Date  . Muscle biopsy Left 1990's    "knot biopsy from carrying mail bag for years"  . Bladder suspension  2012  . Splenic artery embolization  01/28/2014  . Joint replacement    . Total knee arthroplasty Bilateral 2012  . Vaginal hysterectomy    . Revison of arteriovenous fistula Right 01/31/2014    Procedure: REPAIR PSEUDOANEURYSM;  Surgeon: Nada Libman, MD;  Location: [facility] OR;  Service: Vascular;  Laterality: Right;  Repair of Femoral Pseudoaneurysm.   Social History:  reports that she  has never smoked. She has never used smokeless tobacco. She reports that she does not drink alcohol or use illicit drugs. Where does patient live home. Can patient participate in ADLs? Yes.  Allergies  Allergen Reactions  . Aspirin Other (See Comments)    Causes blood in urine    Family History: History reviewed. No pertinent family history.    Prior to Admission medications   Medication Sig Start Date End Date Taking? Authorizing Provider  bethanechol (URECHOLINE) 50 MG tablet Take 50 mg by mouth 2 (two) times daily.   Yes Historical Provider, MD  Calcium Carbonate-Vitamin D (CALCIUM 500 + D PO) Take 500 mg by mouth 2 (two) times daily.   Yes Historical Provider, MD  cholecalciferol (VITAMIN D) 1000 UNITS tablet Take 2,000 Units by mouth daily.   Yes Historical Provider, MD  clopidogrel (PLAVIX) 75 MG tablet Take 75 mg by mouth at bedtime.    Yes Historical Provider, MD  Cranberry-Vitamin C-Vitamin E (CRANBERRY PLUS VITAMIN C) 140-100-3 MG-MG-UNIT CAPS Take 2,000 Units by mouth 2 (two) times daily.   Yes Historical Provider, MD  ezetimibe (ZETIA) 10 MG tablet Take 10 mg by mouth at bedtime.    Yes Historical Provider, MD  folic acid (FOLVITE) 400 MCG tablet Take 400 mcg by mouth daily.   Yes Historical Provider, MD  labetalol (NORMODYNE) 200 MG tablet Take 300 mg by mouth 2 (two) times daily. Take 1 1/2 tablets twice daily   Yes Historical Provider, MD  levothyroxine (SYNTHROID, LEVOTHROID) 50 MCG tablet Take  50 mcg by mouth daily before breakfast.   Yes Historical Provider, MD  lisinopril (PRINIVIL,ZESTRIL) 40 MG tablet Take 40 mg by mouth daily.   Yes Historical Provider, MD  Omega-3 Fatty Acids (FISH OIL) 1000 MG CAPS Take 1,000 mg by mouth 2 (two) times daily.   Yes Historical Provider, MD  psyllium (METAMUCIL) 58.6 % packet Take 1 packet by mouth daily.   Yes Historical Provider, MD  tamsulosin (FLOMAX) 0.4 MG CAPS capsule Take 0.4 mg by mouth 2 (two) times daily.   Yes Historical  Provider, MD    Physical Exam: Filed Vitals:   03/23/14 0011 03/23/14 0019 03/23/14 0154  BP: 181/106 183/66 171/84  Pulse: 69 70 73  Temp: 98.4 F (36.9 C)    TempSrc: Oral    Resp: 20    Height:   5\' 7"  (1.702 m)  Weight:   89.3 kg (196 lb 13.9 oz)  SpO2: 96%       General:  Well-developed and nourished.  Eyes: Anicteric no pallor.  ENT: No discharge from the ears eyes nose mouth.  Neck: No mass felt.  Cardiovascular: S1-S2 heard.  Respiratory: No rhonchi or crepitations.  Abdomen: Soft nontender bowel sounds present. No guarding or rigidity.  Skin: No rash.  Musculoskeletal: Pain on moving right hip.  Psychiatric: Appears normal.  Neurologic: Alert awake oriented to time place and person. Moves all extremities.  Labs on Admission:  Basic Metabolic Panel: No results found for this basename: NA, K, CL, CO2, GLUCOSE, BUN, CREATININE, CALCIUM, MG, PHOS,  in the last 168 hours Liver Function Tests: No results found for this basename: AST, ALT, ALKPHOS, BILITOT, PROT, ALBUMIN,  in the last 168 hours No results found for this basename: LIPASE, AMYLASE,  in the last 168 hours No results found for this basename: AMMONIA,  in the last 168 hours CBC: No results found for this basename: WBC, NEUTROABS, HGB, HCT, MCV, PLT,  in the last 168 hours Cardiac Enzymes: No results found for this basename: CKTOTAL, CKMB, CKMBINDEX, TROPONINI,  in the last 168 hours  BNP (last 3 results) No results found for this basename: PROBNP,  in the last 8760 hours CBG: No results found for this basename: GLUCAP,  in the last 168 hours  Radiological Exams on Admission: No results found.  Assessment/Plan Principal Problem:   Intertrochanteric fracture of right hip Active Problems:   HTN (hypertension)   Unspecified hypothyroidism   Anemia   Thrombocytopenia, unspecified   Closed right hip fracture   1. Right hip fracture status post mechanical fall - I have discussed with  on-call orthopedic surgeon for Dr. Charlann Boxer, Dr. Shelle Iron. Patient will be kept n.p.o. in anticipation of possible surgery. Patient looks medically stable for surgery. Dr. Shelle Iron has advised to keep patient on traction. 2. Hypertension - since patient is n.p.o. patient will be placed on when necessary IV labetalol for now. 3. Hypothyroidism - IV Synthroid until patient can take orally. 4. Chronic anemia and thrombocytopenia - closely follow CBC. 5. Abnormal chest x-ray - chest x-ray done in the ER at [facility] showed chronic interstitial markings. Patient was advised she will need further followup as outpatient. 6. History of CVA  - patient is on Plavix which will be held for surgery. 7. Possible UTI - UA were showing leukocyte esterase positive and bacteria. I have ordered urine culture and place patient on ceftriaxone for now.  Patient labs are pending including complete metabolic panel CBC EKG cardiac markers and repeat chest x-ray.  Code Status: Full code.  Family Communication: None.  Disposition Plan: Admit to inpatient.    Samaya Boardley N. [facility] Pager [phone].  If 7PM-7AM, please contact night-coverage [email or web address] Password [email or web address] 03/23/2014, 3:13 AM

## 2014-03-23 NOTE — Consult Note (Signed)
Reason for Consult:R hip fx Referring Physician: EDP  [name] is an 80 y.o. female.  HPI: Pt reports fall onto her R hip yesterday. She was initially taken to [facility] and requested transfer to [facility] because Dr. Alvan Dame did both her TKA's. She denies knee pain. Denies any other injuries from her fall. Has no other c/o.  Past Medical History  Diagnosis Date  . Hypertension   . High cholesterol   . Stroke 1990's    "mild", denies residual on 01/28/2014  . Pneumonia     "maybe in my teens"  . Hypothyroidism   . Anemia   . History of blood transfusion     "S/P knee OR"  . Arthritis     "was in my knees"  . Recurrent UTI (urinary tract infection)     Past Surgical History  Procedure Laterality Date  . Muscle biopsy Left 1990's    "knot biopsy from carrying mail bag for years"  . Bladder suspension  2012  . Splenic artery embolization  01/28/2014  . Joint replacement    . Total knee arthroplasty Bilateral 2012  . Vaginal hysterectomy    . Revison of arteriovenous fistula Right 01/31/2014    Procedure: REPAIR PSEUDOANEURYSM;  Surgeon: Serafina Mitchell, MD;  Location: [facility] OR;  Service: Vascular;  Laterality: Right;  Repair of Femoral Pseudoaneurysm.    History reviewed. No pertinent family history.  Social History:  reports that she has never smoked. She has never used smokeless tobacco. She reports that she does not drink alcohol or use illicit drugs.  Allergies:  Allergies  Allergen Reactions  . Aspirin Other (See Comments)    Causes blood in urine    Medications: I have reviewed the patient's current medications.  Results for orders placed during the hospital encounter of 03/23/14 (from the past 48 hour(s))  URINALYSIS, ROUTINE W REFLEX MICROSCOPIC     Status: Abnormal   Collection Time    03/23/14  4:03 AM      Result Value Ref Range   Color, Urine YELLOW  YELLOW   APPearance CLEAR  CLEAR   Specific Gravity, Urine 1.011  1.005 - 1.030   pH 8.0  5.0 - 8.0    Glucose, UA NEGATIVE  NEGATIVE mg/dL   Hgb urine dipstick NEGATIVE  NEGATIVE   Bilirubin Urine NEGATIVE  NEGATIVE   Ketones, ur NEGATIVE  NEGATIVE mg/dL   Protein, ur NEGATIVE  NEGATIVE mg/dL   Urobilinogen, UA 0.2  0.0 - 1.0 mg/dL   Nitrite NEGATIVE  NEGATIVE   Leukocytes, UA LARGE (*) NEGATIVE  URINE MICROSCOPIC-ADD ON     Status: None   Collection Time    03/23/14  4:03 AM      Result Value Ref Range   Squamous Epithelial / LPF RARE  RARE   WBC, UA 21-50  <3 WBC/hpf   Comment: WITH CLUMPS  SURGICAL PCR SCREEN     Status: Abnormal   Collection Time    03/23/14  5:00 AM      Result Value Ref Range   MRSA, PCR NEGATIVE  NEGATIVE   Staphylococcus aureus POSITIVE (*) NEGATIVE   Comment:            The Xpert SA Assay (FDA     approved for NASAL specimens     in patients over 85 years of age),     is one component of     a comprehensive surveillance     program.  Test performance  has     been validated by [employer] for patients greater     than or equal to 84 year old.     It is not intended     to diagnose infection nor to     guide or monitor treatment.  COMPREHENSIVE METABOLIC PANEL     Status: Abnormal   Collection Time    03/23/14  5:09 AM      Result Value Ref Range   Sodium 136 (*) 137 - 147 mEq/L   Potassium 4.4  3.7 - 5.3 mEq/L   Chloride 101  96 - 112 mEq/L   CO2 22  19 - 32 mEq/L   Glucose, Bld 140 (*) 70 - 99 mg/dL   BUN 13  6 - 23 mg/dL   Creatinine, Ser 0.94  0.50 - 1.10 mg/dL   Calcium 9.8  8.4 - 10.5 mg/dL   Total Protein 6.9  6.0 - 8.3 g/dL   Albumin 3.9  3.5 - 5.2 g/dL   AST 28  0 - 37 U/L   ALT 21  0 - 35 U/L   Alkaline Phosphatase 73  39 - 117 U/L   Total Bilirubin 0.8  0.3 - 1.2 mg/dL   GFR calc non Af Amer 57 (*) >90 mL/min   GFR calc Af Amer 66 (*) >90 mL/min   Comment: (NOTE)     The eGFR has been calculated using the CKD EPI equation.     This calculation has not been validated in all clinical situations.     eGFR's persistently <90  mL/min signify possible Chronic Kidney     Disease.   Anion gap 13  5 - 15  CBC WITH DIFFERENTIAL     Status: Abnormal   Collection Time    03/23/14  5:09 AM      Result Value Ref Range   WBC 6.3  4.0 - 10.5 K/uL   RBC 3.71 (*) 3.87 - 5.11 MIL/uL   Hemoglobin 11.4 (*) 12.0 - 15.0 g/dL   HCT 33.9 (*) 36.0 - 46.0 %   MCV 91.4  78.0 - 100.0 fL   MCH 30.7  26.0 - 34.0 pg   MCHC 33.6  30.0 - 36.0 g/dL   RDW 12.5  11.5 - 15.5 %   Platelets 88 (*) 150 - 400 K/uL   Comment: SPECIMEN CHECKED FOR CLOTS     REPEATED TO VERIFY     PLATELET COUNT CONFIRMED BY SMEAR   Neutrophils Relative % 76  43 - 77 %   Neutro Abs 4.8  1.7 - 7.7 K/uL   Lymphocytes Relative 11 (*) 12 - 46 %   Lymphs Abs 0.7  0.7 - 4.0 K/uL   Monocytes Relative 11  3 - 12 %   Monocytes Absolute 0.7  0.1 - 1.0 K/uL   Eosinophils Relative 2  0 - 5 %   Eosinophils Absolute 0.1  0.0 - 0.7 K/uL   Basophils Relative 0  0 - 1 %   Basophils Absolute 0.0  0.0 - 0.1 K/uL  PROTIME-INR     Status: Abnormal   Collection Time    03/23/14  5:09 AM      Result Value Ref Range   Prothrombin Time 15.7 (*) 11.6 - 15.2 seconds   INR 1.25  0.00 - 1.49  TYPE AND SCREEN     Status: None   Collection Time    03/23/14  5:09 AM  Result Value Ref Range   ABO/RH(D) O POS     Antibody Screen NEG     Sample Expiration 03/26/2014    TROPONIN I     Status: None   Collection Time    03/23/14  5:09 AM      Result Value Ref Range   Troponin I <0.30  <0.30 ng/mL   Comment:            Due to the release kinetics of cTnI,     a negative result within the first hours     of the onset of symptoms does not rule out     myocardial infarction with certainty.     If myocardial infarction is still suspected,     repeat the test at appropriate intervals.    No results found.  Review of Systems  Constitutional: Negative.   HENT: Negative.   Eyes: Negative.   Respiratory: Negative.   Cardiovascular: Negative.   Gastrointestinal: Negative.    Genitourinary: Negative.   Musculoskeletal: Positive for joint pain.  Skin: Negative.   Neurological: Negative.   Psychiatric/Behavioral: Negative.    Blood pressure 171/84, pulse 73, temperature 98.4 F (36.9 C), temperature source Oral, resp. rate 20, height 5' 7"  (1.702 m), weight 89.3 kg (196 lb 13.9 oz), SpO2 96.00%. Physical Exam  Constitutional: She is oriented to person, place, and time. She appears well-developed and well-nourished.  HENT:  Head: Normocephalic and atraumatic.  Eyes: Conjunctivae and EOM are normal. Pupils are equal, round, and reactive to light.  Neck: Normal range of motion. Neck supple.  Cardiovascular: Normal rate and regular rhythm.   Respiratory: Effort normal and breath sounds normal.  GI: Soft. Bowel sounds are normal.  Musculoskeletal:  R leg shortened and ER R knee scar s/p TKA. R knee nontender Pain R hip/groin with ROM No calf pain NVI distally good cap refill  Neurological: She is alert and oriented to person, place, and time. She has normal reflexes.  Skin: Skin is warm and dry.  Psychiatric: She has a normal mood and affect.   xrays R hip reviewed with intertrochanteric fx with displaced lesser trochanter  Assessment/Plan: Right hip intertrochanteric fx  Plan OR today for IM nail R hip fracture by Dr. Alvan Dame Remain NPO for OR today Discussed procedure itself, risks, complications, and alternatives with the pt including but not limited to DVT, PE, infx, bleeding, failure of procedure, need for secondary procedure, anesthesia risk, even death. She desires to proceed. Discussed post-op protocols, need for PT, possible SNF/rehab placement, protected weightbearing. Will plan Lovenox for DVT ppx post-op Traction in the interim  BISSELL, JACLYN M. 03/23/2014, 7:48 AM    Ms Loudon has been a patient of mine for bilateral TKRs.  This was an unfortunate fall at home, ground level.  Son currently at home recovering from back surgery.  This will  effect her recovery as she will require SNF post operatively

## 2014-03-23 NOTE — Brief Op Note (Signed)
03/23/2014  9:38 PM  PATIENT:  [name]  80 y.o. female  PRE-OPERATIVE DIAGNOSIS:  right intertrochanteric hip fracture  POST-OPERATIVE DIAGNOSIS:  right intertrochanteric hip fracture  PROCEDURE:  Procedure(s): INTRAMEDULLARY (IM) NAIL FEMORAL (Right), ORIF right hip  SURGEON:  Surgeon(s) and Role:    * Mauri Pole, MD - Primary  PHYSICIAN ASSISTANT: None  ANESTHESIA:   general  EBL:  Total I/O In: 800 [I.V.:800] Out: 550 [Urine:450; Blood:100]  BLOOD ADMINISTERED:none  DRAINS: none   LOCAL MEDICATIONS USED:  NONE  SPECIMEN:  No Specimen  DISPOSITION OF SPECIMEN:  N/A  COUNTS:  YES  TOURNIQUET:  * No tourniquets in log *  DICTATION: .Other Dictation: Dictation Number [id number]  PLAN OF CARE: Admit to inpatient   PATIENT DISPOSITION:  PACU - hemodynamically stable.   Delay start of Pharmacological VTE agent (>24hrs) due to surgical blood loss or risk of bleeding: no

## 2014-03-23 NOTE — H&P (View-Only) (Signed)
Reason for Consult:R hip fx Referring Physician: EDP  [name] is an 80 y.o. female.  HPI: Pt reports fall onto her R hip yesterday. She was initially taken to [facility] and requested transfer to [facility] because Dr. Alvan Dame did both her TKA's. She denies knee pain. Denies any other injuries from her fall. Has no other c/o.  Past Medical History  Diagnosis Date  . Hypertension   . High cholesterol   . Stroke 1990's    "mild", denies residual on 01/28/2014  . Pneumonia     "maybe in my teens"  . Hypothyroidism   . Anemia   . History of blood transfusion     "S/P knee OR"  . Arthritis     "was in my knees"  . Recurrent UTI (urinary tract infection)     Past Surgical History  Procedure Laterality Date  . Muscle biopsy Left 1990's    "knot biopsy from carrying mail bag for years"  . Bladder suspension  2012  . Splenic artery embolization  01/28/2014  . Joint replacement    . Total knee arthroplasty Bilateral 2012  . Vaginal hysterectomy    . Revison of arteriovenous fistula Right 01/31/2014    Procedure: REPAIR PSEUDOANEURYSM;  Surgeon: Serafina Mitchell, MD;  Location: [facility] OR;  Service: Vascular;  Laterality: Right;  Repair of Femoral Pseudoaneurysm.    History reviewed. No pertinent family history.  Social History:  reports that she has never smoked. She has never used smokeless tobacco. She reports that she does not drink alcohol or use illicit drugs.  Allergies:  Allergies  Allergen Reactions  . Aspirin Other (See Comments)    Causes blood in urine    Medications: I have reviewed the patient's current medications.  Results for orders placed during the hospital encounter of 03/23/14 (from the past 48 hour(s))  URINALYSIS, ROUTINE W REFLEX MICROSCOPIC     Status: Abnormal   Collection Time    03/23/14  4:03 AM      Result Value Ref Range   Color, Urine YELLOW  YELLOW   APPearance CLEAR  CLEAR   Specific Gravity, Urine 1.011  1.005 - 1.030   pH 8.0  5.0 - 8.0    Glucose, UA NEGATIVE  NEGATIVE mg/dL   Hgb urine dipstick NEGATIVE  NEGATIVE   Bilirubin Urine NEGATIVE  NEGATIVE   Ketones, ur NEGATIVE  NEGATIVE mg/dL   Protein, ur NEGATIVE  NEGATIVE mg/dL   Urobilinogen, UA 0.2  0.0 - 1.0 mg/dL   Nitrite NEGATIVE  NEGATIVE   Leukocytes, UA LARGE (*) NEGATIVE  URINE MICROSCOPIC-ADD ON     Status: None   Collection Time    03/23/14  4:03 AM      Result Value Ref Range   Squamous Epithelial / LPF RARE  RARE   WBC, UA 21-50  <3 WBC/hpf   Comment: WITH CLUMPS  SURGICAL PCR SCREEN     Status: Abnormal   Collection Time    03/23/14  5:00 AM      Result Value Ref Range   MRSA, PCR NEGATIVE  NEGATIVE   Staphylococcus aureus POSITIVE (*) NEGATIVE   Comment:            The Xpert SA Assay (FDA     approved for NASAL specimens     in patients over 11 years of age),     is one component of     a comprehensive surveillance     program.  Test performance  has     been validated by [employer] for patients greater     than or equal to 73 year old.     It is not intended     to diagnose infection nor to     guide or monitor treatment.  COMPREHENSIVE METABOLIC PANEL     Status: Abnormal   Collection Time    03/23/14  5:09 AM      Result Value Ref Range   Sodium 136 (*) 137 - 147 mEq/L   Potassium 4.4  3.7 - 5.3 mEq/L   Chloride 101  96 - 112 mEq/L   CO2 22  19 - 32 mEq/L   Glucose, Bld 140 (*) 70 - 99 mg/dL   BUN 13  6 - 23 mg/dL   Creatinine, Ser 0.94  0.50 - 1.10 mg/dL   Calcium 9.8  8.4 - 10.5 mg/dL   Total Protein 6.9  6.0 - 8.3 g/dL   Albumin 3.9  3.5 - 5.2 g/dL   AST 28  0 - 37 U/L   ALT 21  0 - 35 U/L   Alkaline Phosphatase 73  39 - 117 U/L   Total Bilirubin 0.8  0.3 - 1.2 mg/dL   GFR calc non Af Amer 57 (*) >90 mL/min   GFR calc Af Amer 66 (*) >90 mL/min   Comment: (NOTE)     The eGFR has been calculated using the CKD EPI equation.     This calculation has not been validated in all clinical situations.     eGFR's persistently <90  mL/min signify possible Chronic Kidney     Disease.   Anion gap 13  5 - 15  CBC WITH DIFFERENTIAL     Status: Abnormal   Collection Time    03/23/14  5:09 AM      Result Value Ref Range   WBC 6.3  4.0 - 10.5 K/uL   RBC 3.71 (*) 3.87 - 5.11 MIL/uL   Hemoglobin 11.4 (*) 12.0 - 15.0 g/dL   HCT 33.9 (*) 36.0 - 46.0 %   MCV 91.4  78.0 - 100.0 fL   MCH 30.7  26.0 - 34.0 pg   MCHC 33.6  30.0 - 36.0 g/dL   RDW 12.5  11.5 - 15.5 %   Platelets 88 (*) 150 - 400 K/uL   Comment: SPECIMEN CHECKED FOR CLOTS     REPEATED TO VERIFY     PLATELET COUNT CONFIRMED BY SMEAR   Neutrophils Relative % 76  43 - 77 %   Neutro Abs 4.8  1.7 - 7.7 K/uL   Lymphocytes Relative 11 (*) 12 - 46 %   Lymphs Abs 0.7  0.7 - 4.0 K/uL   Monocytes Relative 11  3 - 12 %   Monocytes Absolute 0.7  0.1 - 1.0 K/uL   Eosinophils Relative 2  0 - 5 %   Eosinophils Absolute 0.1  0.0 - 0.7 K/uL   Basophils Relative 0  0 - 1 %   Basophils Absolute 0.0  0.0 - 0.1 K/uL  PROTIME-INR     Status: Abnormal   Collection Time    03/23/14  5:09 AM      Result Value Ref Range   Prothrombin Time 15.7 (*) 11.6 - 15.2 seconds   INR 1.25  0.00 - 1.49  TYPE AND SCREEN     Status: None   Collection Time    03/23/14  5:09 AM  Result Value Ref Range   ABO/RH(D) O POS     Antibody Screen NEG     Sample Expiration 03/26/2014    TROPONIN I     Status: None   Collection Time    03/23/14  5:09 AM      Result Value Ref Range   Troponin I <0.30  <0.30 ng/mL   Comment:            Due to the release kinetics of cTnI,     a negative result within the first hours     of the onset of symptoms does not rule out     myocardial infarction with certainty.     If myocardial infarction is still suspected,     repeat the test at appropriate intervals.    No results found.  Review of Systems  Constitutional: Negative.   HENT: Negative.   Eyes: Negative.   Respiratory: Negative.   Cardiovascular: Negative.   Gastrointestinal: Negative.    Genitourinary: Negative.   Musculoskeletal: Positive for joint pain.  Skin: Negative.   Neurological: Negative.   Psychiatric/Behavioral: Negative.    Blood pressure 171/84, pulse 73, temperature 98.4 F (36.9 C), temperature source Oral, resp. rate 20, height 5' 7"  (1.702 m), weight 89.3 kg (196 lb 13.9 oz), SpO2 96.00%. Physical Exam  Constitutional: She is oriented to person, place, and time. She appears well-developed and well-nourished.  HENT:  Head: Normocephalic and atraumatic.  Eyes: Conjunctivae and EOM are normal. Pupils are equal, round, and reactive to light.  Neck: Normal range of motion. Neck supple.  Cardiovascular: Normal rate and regular rhythm.   Respiratory: Effort normal and breath sounds normal.  GI: Soft. Bowel sounds are normal.  Musculoskeletal:  R leg shortened and ER R knee scar s/p TKA. R knee nontender Pain R hip/groin with ROM No calf pain NVI distally good cap refill  Neurological: She is alert and oriented to person, place, and time. She has normal reflexes.  Skin: Skin is warm and dry.  Psychiatric: She has a normal mood and affect.   xrays R hip reviewed with intertrochanteric fx with displaced lesser trochanter  Assessment/Plan: Right hip intertrochanteric fx  Plan OR today for IM nail R hip fracture by Dr. Alvan Dame Remain NPO for OR today Discussed procedure itself, risks, complications, and alternatives with the pt including but not limited to DVT, PE, infx, bleeding, failure of procedure, need for secondary procedure, anesthesia risk, even death. She desires to proceed. Discussed post-op protocols, need for PT, possible SNF/rehab placement, protected weightbearing. Will plan Lovenox for DVT ppx post-op Traction in the interim  Woodard, Tammy M. 03/23/2014, 7:48 AM    Ms Broden has been a patient of mine for bilateral TKRs.  This was an unfortunate fall at home, ground level.  Son currently at home recovering from back surgery.  This will  effect her recovery as she will require SNF post operatively

## 2014-03-23 NOTE — Interval H&P Note (Signed)
History and Physical Interval Note:  03/23/2014 7:30 PM  [name]  has presented today for surgery, with the diagnosis of right hip fracture  The various methods of treatment have been discussed with the patient and family. After consideration of risks, benefits and other options for treatment, the patient has consented to  Procedure(s): INTRAMEDULLARY (IM) NAIL FEMORAL (Right) as a surgical intervention .  The patient's history has been reviewed, patient examined, no change in status, stable for surgery.  I have reviewed the patient's chart and labs.  Questions were answered to the patient's satisfaction.     Mauri Pole

## 2014-03-23 NOTE — Progress Notes (Signed)
[facility] PROGRESS NOTE  TIFFANEE TSAI NFA:[id number] DOB: Apr 23, 1934 DOA: 03/23/2014 PCP: Paulina Fusi, MD  Assessment/Plan: Principal Problem:   Intertrochanteric fracture of right hip Active Problems:   HTN (hypertension)   Unspecified hypothyroidism   Anemia   Thrombocytopenia, unspecified   Closed right hip fracture    1. Right hip fracture status post mechanical fall -  Dr. Charlann Boxer, Dr. Shelle Iron. Patient is n.p.o. in anticipation of possible surgery. Patient looks medically stable for surgery. Dr. Shelle Iron has advised to keep patient on traction. Hemoglobin of 11.4, may need postoperative transfusion 2. Hypertension - since patient is n.p.o. patient will be placed on when necessary IV labetalol for now. 3. Hypothyroidism - IV Synthroid until patient can take orally. 4. Chronic anemia and thrombocytopenia - closely follow CBC. 5. Abnormal chest x-ray - chest x-ray done in the ER at [facility] showed chronic interstitial markings. Patient was advised she will need further followup as outpatient. 6. History of CVA - patient is on Plavix which will be held for surgery. 7. Possible UTI - UA were showing leukocyte esterase positive and bacteria. I have ordered urine culture and place patient on ceftriaxone for now.    Code Status: full Family Communication: family updated about patient's clinical progress Disposition Plan:  As above    Brief narrative: [name] is a 80 y.o. female history of hypertension, CVA, hyperlipidemia had a fall at her house when she tripped on her rug. Patient denies having hit her head or loosing consciousness. X-rays show right intertrochanteric fracture and patient was transferred to [facility] at patient's request. This patient denies any chest pain or shortness of breath. Chest x-ray is done shows chronic interstitial markings. Cardiac markers liver function tests and metabolic panel were largely unremarkable. Patient's  CBC showed chronic anemia and thrombocytopenia with urinalysis showing possible urinary tract infection. Patient was admitted last May for splenic artery aneurysm.    Consultants:  Orthopedics  Procedures: OR today for IM nail R femur by Dr. Shelle Iron   Antibiotics:  Rocephin  HPI/Subjective: Stable, in good spirits,  Objective: Filed Vitals:   03/23/14 0011 03/23/14 0019 03/23/14 0154 03/23/14 1001  BP: 181/106 183/66 171/84 167/73  Pulse: 69 70 73 71  Temp: 98.4 F (36.9 C)   98.7 F (37.1 C)  TempSrc: Oral   Oral  Resp: 20   16  Height:   5\' 7"  (1.702 m)   Weight:   89.3 kg (196 lb 13.9 oz)   SpO2: 96%   96%    Intake/Output Summary (Last 24 hours) at 03/23/14 1112 Last data filed at 03/23/14 1030  Gross per 24 hour  Intake      0 ml  Output   1050 ml  Net  -1050 ml    Exam:  HENT:  Head: Atraumatic.  Nose: Nose normal.  Mouth/Throat: Oropharynx is clear and moist.  Eyes: Conjunctivae are normal. Pupils are equal, round, and reactive to light. No scleral icterus.  Neck: Neck supple. No tracheal deviation present.  Cardiovascular: Normal rate, regular rhythm, normal heart sounds and intact distal pulses.  Pulmonary/Chest: Effort normal and breath sounds normal. No respiratory distress.  Abdominal: Soft. Normal appearance and bowel sounds are normal. She exhibits no distension. There is no tenderness.  Musculoskeletal: R leg shortened and ER R knee scar s/p TKA. R knee nontender Pain R hip/groin with ROM No calf pain NVI distally good cap refill   .  Neurological: She is alert. No cranial  nerve deficit.    Data Reviewed: Basic Metabolic Panel:  Recent Labs Lab 03/23/14 0509  NA 136*  K 4.4  CL 101  CO2 22  GLUCOSE 140*  BUN 13  CREATININE 0.94  CALCIUM 9.8    Liver Function Tests:  Recent Labs Lab 03/23/14 0509  AST 28  ALT 21  ALKPHOS 73  BILITOT 0.8  PROT 6.9  ALBUMIN 3.9   No results found for this basename: LIPASE, AMYLASE,   in the last 168 hours No results found for this basename: AMMONIA,  in the last 168 hours  CBC:  Recent Labs Lab 03/23/14 0509  WBC 6.3  NEUTROABS 4.8  HGB 11.4*  HCT 33.9*  MCV 91.4  PLT 88*    Cardiac Enzymes:  Recent Labs Lab 03/23/14 0509  TROPONINI <0.30   BNP (last 3 results) No results found for this basename: PROBNP,  in the last 8760 hours   CBG: No results found for this basename: GLUCAP,  in the last 168 hours  Recent Results (from the past 240 hour(s))  SURGICAL PCR SCREEN     Status: Abnormal   Collection Time    03/23/14  5:00 AM      Result Value Ref Range Status   MRSA, PCR NEGATIVE  NEGATIVE Final   Staphylococcus aureus POSITIVE (*) NEGATIVE Final   Comment:            The Xpert SA Assay (FDA     approved for NASAL specimens     in patients over 68 years of age),     is one component of     a comprehensive surveillance     program.  Test performance has     been validated by [employer] for patients greater     than or equal to 18 year old.     It is not intended     to diagnose infection nor to     guide or monitor treatment.     Studies: Dg Chest Portable 1 View  03/23/2014   CLINICAL DATA:  Preop, evaluate for infiltrates  EXAM: PORTABLE CHEST - 1 VIEW  COMPARISON:  Chest x-ray of 03/22/2014, and 03/23/2010  FINDINGS: The lungs remain hyperaerated consistent with emphysema. Coarse and prominent interstitial lung markings are stable bilaterally most consistent with chronic interstitial lung disease. Mild cardiomegaly is stable.  IMPRESSION: Stable emphysema and chronic interstitial change. No active process. Stable cardiomegaly.   Electronically Signed   By: Dwyane Dee M.D.   On: 03/23/2014 08:05    Scheduled Meds: .  ceFAZolin (ANCEF) IV  2 g Intravenous On Call to OR  . cefTRIAXone (ROCEPHIN)  IV  1 g Intravenous QHS  . enoxaparin (LOVENOX) injection  40 mg Subcutaneous Q24H  . levothyroxine  25 mcg Intravenous QAC breakfast    Continuous Infusions: . sodium chloride 10 mL/hr at 03/23/14 2952    Principal Problem:   Intertrochanteric fracture of right hip Active Problems:   HTN (hypertension)   Unspecified hypothyroidism   Anemia   Thrombocytopenia, unspecified   Closed right hip fracture    Time spent: 40 minutes   [facility]  [facility] Pager [phone]. If 8PM-8AM, please contact night-coverage at [email or web address], password [facility] 03/23/2014, 11:12 AM  LOS: 0 days

## 2014-03-23 NOTE — Progress Notes (Signed)
[place], RN, BSN, CCM  [phone]  Chart Reviewed for discharge and hospital needs.  Discharge needs at time of review:  Patient lives alone and only has friend close by.  Will followed for hhc and dme needs versus short term rehab placement. Review of patient progress due on [id number]

## 2014-03-24 ENCOUNTER — Encounter (HOSPITAL_COMMUNITY): Payer: Self-pay | Admitting: Orthopedic Surgery

## 2014-03-24 LAB — BASIC METABOLIC PANEL
Anion gap: 14 (ref 5–15)
BUN: 16 mg/dL (ref 6–23)
CHLORIDE: 100 meq/L (ref 96–112)
CO2: 20 mEq/L (ref 19–32)
Calcium: 9.1 mg/dL (ref 8.4–10.5)
Creatinine, Ser: 1.12 mg/dL — ABNORMAL HIGH (ref 0.50–1.10)
GFR calc Af Amer: 53 mL/min — ABNORMAL LOW (ref >90)
GFR calc non Af Amer: 46 mL/min — ABNORMAL LOW (ref >90)
GLUCOSE: 162 mg/dL — AB (ref 70–99)
POTASSIUM: 5.5 meq/L — AB (ref 3.7–5.3)
Sodium: 134 mEq/L — ABNORMAL LOW (ref 137–147)

## 2014-03-24 LAB — URINE CULTURE
Colony Count: NO GROWTH
Culture: NO GROWTH

## 2014-03-24 LAB — CBC
HEMATOCRIT: 30.2 % — AB (ref 36.0–46.0)
HEMOGLOBIN: 10.4 g/dL — AB (ref 12.0–15.0)
MCH: 31.4 pg (ref 26.0–34.0)
MCHC: 34.4 g/dL (ref 30.0–36.0)
MCV: 91.2 fL (ref 78.0–100.0)
Platelets: 121 10*3/uL — ABNORMAL LOW (ref 150–400)
RBC: 3.31 MIL/uL — AB (ref 3.87–5.11)
RDW: 12.8 % (ref 11.5–15.5)
WBC: 10.4 10*3/uL (ref 4.0–10.5)

## 2014-03-24 MED ORDER — SODIUM CHLORIDE 0.9 % IV SOLN
INTRAVENOUS | Status: DC
  Administered 2014-03-25 – 2014-03-26 (×2): via INTRAVENOUS

## 2014-03-24 MED ORDER — HYDROCODONE-ACETAMINOPHEN 5-325 MG PO TABS: 1.0000 | ORAL_TABLET | ORAL | Status: DC | PRN

## 2014-03-24 MED ORDER — LEVOTHYROXINE SODIUM 50 MCG PO TABS
50.0000 ug | ORAL_TABLET | Freq: Every day | ORAL | Status: DC
  Administered 2014-03-25 – 2014-03-27 (×3): 50 ug via ORAL
  Filled 2014-03-24 (×4): qty 1

## 2014-03-24 MED ORDER — HYDROCODONE-ACETAMINOPHEN 5-325 MG PO TABS
1.0000 | ORAL_TABLET | ORAL | Status: DC | PRN
  Administered 2014-03-24: 1 via ORAL
  Administered 2014-03-24: 2 via ORAL
  Administered 2014-03-24: 1 via ORAL
  Administered 2014-03-25: 2 via ORAL
  Administered 2014-03-25 – 2014-03-26 (×2): 1 via ORAL
  Administered 2014-03-27 (×2): 2 via ORAL
  Filled 2014-03-24: qty 1
  Filled 2014-03-24 (×3): qty 2
  Filled 2014-03-24: qty 1
  Filled 2014-03-24 (×2): qty 2
  Filled 2014-03-24 (×2): qty 1

## 2014-03-24 MED ORDER — DSS 100 MG PO CAPS: 100.0000 mg | ORAL_CAPSULE | Freq: Two times a day (BID) | ORAL | Status: DC

## 2014-03-24 MED ORDER — FERROUS SULFATE 325 (65 FE) MG PO TABS: 325.0000 mg | ORAL_TABLET | Freq: Three times a day (TID) | ORAL | Status: DC

## 2014-03-24 MED ORDER — CLOPIDOGREL BISULFATE 75 MG PO TABS
75.0000 mg | ORAL_TABLET | Freq: Every day | ORAL | Status: DC
  Administered 2014-03-24 – 2014-03-25 (×2): 75 mg via ORAL
  Filled 2014-03-24 (×3): qty 1

## 2014-03-24 MED ORDER — SODIUM CHLORIDE 0.9 % IV BOLUS (SEPSIS)
500.0000 mL | Freq: Once | INTRAVENOUS | Status: AC
  Administered 2014-03-24: 500 mL via INTRAVENOUS

## 2014-03-24 MED ORDER — ENSURE COMPLETE PO LIQD
237.0000 mL | ORAL | Status: DC
  Administered 2014-03-24 – 2014-03-26 (×3): 237 mL via ORAL

## 2014-03-24 NOTE — Op Note (Signed)
NAMEJOSSALYN, [name] NO.:  [id number]  MEDICAL RECORD NO.:  [id number]  LOCATION:  1616                         FACILITY:  [facility]  PHYSICIAN:  Madlyn Frankel. Charlann Boxer, M.D.  DATE OF BIRTH:  July 02, 1934  DATE OF PROCEDURE:  03/23/2014 DATE OF DISCHARGE:                              OPERATIVE REPORT   PREOPERATIVE DIAGNOSIS:  Right intertrochanteric femur fracture, displaced, comminuted.  POSTOPERATIVE DIAGNOSIS:  Right intertrochanteric femur fracture, displaced, comminuted.  PROCEDURE:  Open reduction and internal fixation of right intertrochanteric femur fracture utilizing a [employer] AFFIXUS nail 11 x 180 mm with 115 mm lag screw and a distal interlock.  SURGEON:  Madlyn Frankel. Charlann Boxer, M.D.  ASSISTANT:  Surgical team.  ANESTHESIA:  General.  SPECIMENS:  None.  COMPLICATIONS:  None.  BLOOD LOSS:  About 100 mL.  INDICATION FOR PROCEDURE:  [name] is a 80 year old patient of mine with a history of bilateral total knee arthroplasties.  She was walking around her house when she caught her left foot under a chair and caused her to fall.  She fell directly on her right side, had immediate onset of pain.  She was brought to the emergency room by EMS.  Radiographs revealed a displaced comminuted right intertrochanteric femur fracture. She was admitted to the medical service through our fracture pathway. She was initially seen and evaluated by physician assistant with Dr. Jene Every, but based on my relationship with her, she wished that I get involved, I had a chance to review with her the indications for the repair, the necessity, and the complications of nonunion and malunion. Consent was obtained after reviewing risks of infection and DVT.  PROCEDURE IN DETAIL:  The patient was brought to the operative theater. Once adequate anesthesia, preoperative antibiotics, Ancef administered. She was positioned supine on the fracture table.  Her left unaffected extremity  was flexed and abducted out of the way with bony prominences padded particularly over the peroneal nerve.  The right foot was placed in the traction boot.  With padded perineal post, traction internal rotation was applied.  The fracture was reduced under fluoroscopic imaging.  A time-out was performed identifying the patient, planned procedure, and extremity.  The right lower extremity was then prepped and draped in a sterile fashion using shower curtain technique from the iliac crest to the knee.  Fluoroscopy was brought back to the field and under fluoroscopic imaging, landmarks were identified.  An incision was then made proximal to the trochanter through the skin down to the gluteal fascia.  The gluteal fascia was then split.  A guidewire was then inserted into the tip of the trochanter into the proximal femur across the fracture site. The proximal femur was then drilled open and the nail was then passed by hand to its appropriate depth.  Once the nail was at its appropriate depth, a guidewire was inserted into the center of the head in the AP and lateral planes.  Once the guidewire was in position, I measured the depth and then drilled and then screwed in a 115 mm lag screw.  Once at appropriate depth, I used compression to medialize the shaft of the fracture.  I then locked the screw  down and backed it off the quarter, turned to allow for further compression.  Then a distal interlock was placed, measuring 36 mm.  Final radiographs were obtained in AP and lateral planes.  The wounds were all irrigated with normal saline solution.  The proximal wound was closed in layers with #1 Vicryl on the gluteal fascia.  The remainder of the wounds were closed with 2-0 Vicryl and staples on the skin.  The skin was cleaned, dried, and dressed sterilely using a long Mepilex dressing.  She was then extubated and brought to the recovery room in stable condition tolerating the procedure  well.     Madlyn Frankel Charlann Boxer, M.D.     MDO/MEDQ  D:  03/23/2014  T:  03/24/2014  Job:  [id number]

## 2014-03-24 NOTE — Progress Notes (Signed)
OT Cancellation Note  Patient Details Name: KYLAR SPEELMAN MRN: [id number] DOB: 04-21-1934   Cancelled Treatment:    Reason Eval/Treat Not Completed: Other (comment) Pt is Medicare/Medicaid and current D/C plan is SNF. No apparent immediate acute care OT needs, therefore will defer OT to SNF. If OT eval is needed please call Acute Rehab Dept. at [place] 03/24/2014, 11:06 AM Lesle Chris, OTR/L [phone] 03/24/2014

## 2014-03-24 NOTE — Progress Notes (Signed)
[facility] PROGRESS NOTE  [name] DGL:[id number] DOB: 1934-05-14 DOA: 03/23/2014 PCP: Paulina Fusi, MD  Assessment/Plan: Principal Problem:   Intertrochanteric fracture of right hip Active Problems:   HTN (hypertension)   Unspecified hypothyroidism   Anemia   Thrombocytopenia, unspecified   Closed right hip fracture    1. Right hip fracture status post mechanical fall -  S/P open reduction internal fixation of right intertrochanteric femur fracture, procedure performed by Dr Charlann Boxer on 03/23/2014 2. Hypertension - Blood pressures low/normal, will hold anihypertensive agents.  3. Hypothyroidism - IV Synthroid until patient can take orally. 4. Chronic anemia and thrombocytopenia - closely follow CBC. 5. Abnormal chest x-ray - chest x-ray done in the ER at [facility] showed chronic interstitial markings. Patient was advised she will need further followup as outpatient. 6. History of CVA - patient is on Plavix which will be held for surgery. 7. Possible UTI - UA were showing leukocyte esterase positive and bacteria. Will transition to oral Ceftin.     Code Status: full Family Communication: Family updated about patient's clinical progress Disposition Plan:  Plan for discharge to SNF when stable   Brief narrative: [name] is a 80 y.o. female history of hypertension, CVA, hyperlipidemia had a fall at her house when she tripped on her rug. Patient denies having hit her head or loosing consciousness. X-rays show right intertrochanteric fracture and patient was transferred to [facility] at patient's request. This patient denies any chest pain or shortness of breath. Chest x-ray is done shows chronic interstitial markings. Cardiac markers liver function tests and metabolic panel were largely unremarkable. Patient's CBC showed chronic anemia and thrombocytopenia with urinalysis showing possible urinary tract infection. Patient was admitted last May  for splenic artery aneurysm.    Consultants:  Orthopedics  Procedures: OR today for IM nail R femur by Dr. Shelle Iron   Antibiotics:  Rocephin  HPI/Subjective: Patient complains of right hip pain, s/p orthopedic repair.  Objective: Filed Vitals:   03/24/14 0400 03/24/14 0432 03/24/14 1110 03/24/14 1344  BP:  133/78 106/56 119/65  Pulse:  105 83 87  Temp:  100.6 F (38.1 C)  98.1 F (36.7 C)  TempSrc:  Oral  Oral  Resp: 14 18  18   Height:      Weight:      SpO2: 98% 94% 95% 100%    Intake/Output Summary (Last 24 hours) at 03/24/14 1503 Last data filed at 03/24/14 1134  Gross per 24 hour  Intake   1300 ml  Output   1850 ml  Net   -550 ml    Exam:  HENT:  Head: Atraumatic.  Nose: Nose normal.  Mouth/Throat: Oropharynx is clear and moist.  Eyes: Conjunctivae are normal. Pupils are equal, round, and reactive to light. No scleral icterus.  Neck: Neck supple. No tracheal deviation present.  Cardiovascular: Normal rate, regular rhythm, normal heart sounds and intact distal pulses.  Pulmonary/Chest: Effort normal and breath sounds normal. No respiratory distress.  Abdominal: Soft. Normal appearance and bowel sounds are normal. She exhibits no distension. There is no tenderness.  Musculoskeletal: Surgical incision site appears clean, no evidence of infection R knee scar s/p TKA. R knee nontender Pain R hip/groin with ROM No calf pain NVI distally good cap refill   .  Neurological: She is alert. No cranial nerve deficit.    Data Reviewed: Basic Metabolic Panel:  Recent Labs Lab 03/23/14 0509 03/24/14 0544  NA 136* 134*  K 4.4 5.5*  CL 101 100  CO2 22 20  GLUCOSE 140* 162*  BUN 13 16  CREATININE 0.94 1.12*  CALCIUM 9.8 9.1    Liver Function Tests:  Recent Labs Lab 03/23/14 0509  AST 28  ALT 21  ALKPHOS 73  BILITOT 0.8  PROT 6.9  ALBUMIN 3.9   No results found for this basename: LIPASE, AMYLASE,  in the last 168 hours No results found for this  basename: AMMONIA,  in the last 168 hours  CBC:  Recent Labs Lab 03/23/14 0509 03/24/14 0544  WBC 6.3 10.4  NEUTROABS 4.8  --   HGB 11.4* 10.4*  HCT 33.9* 30.2*  MCV 91.4 91.2  PLT 88* 121*    Cardiac Enzymes:  Recent Labs Lab 03/23/14 0509  TROPONINI <0.30   BNP (last 3 results) No results found for this basename: PROBNP,  in the last 8760 hours   CBG: No results found for this basename: GLUCAP,  in the last 168 hours  Recent Results (from the past 240 hour(s))  URINE CULTURE     Status: None   Collection Time    03/23/14  4:03 AM      Result Value Ref Range Status   Specimen Description URINE, CATHETERIZED   Final   Special Requests NONE   Final   Culture  Setup Time     Final   Value: 03/23/2014 08:27     Performed at [facility] Count     Final   Value: NO GROWTH     Performed at [facility]   Culture     Final   Value: NO GROWTH     Performed at [facility]   Report Status 03/24/2014 FINAL   Final  SURGICAL PCR SCREEN     Status: Abnormal   Collection Time    03/23/14  5:00 AM      Result Value Ref Range Status   MRSA, PCR NEGATIVE  NEGATIVE Final   Staphylococcus aureus POSITIVE (*) NEGATIVE Final   Comment:            The Xpert SA Assay (FDA     approved for NASAL specimens     in patients over 52 years of age),     is one component of     a comprehensive surveillance     program.  Test performance has     been validated by [employer] for patients greater     than or equal to 36 year old.     It is not intended     to diagnose infection nor to     guide or monitor treatment.     Studies: Dg Chest Portable 1 View  03/23/2014   CLINICAL DATA:  Preop, evaluate for infiltrates  EXAM: PORTABLE CHEST - 1 VIEW  COMPARISON:  Chest x-ray of 03/22/2014, and 03/23/2010  FINDINGS: The lungs remain hyperaerated consistent with emphysema. Coarse and prominent interstitial lung markings are stable bilaterally most  consistent with chronic interstitial lung disease. Mild cardiomegaly is stable.  IMPRESSION: Stable emphysema and chronic interstitial change. No active process. Stable cardiomegaly.   Electronically Signed   By: Dwyane Dee M.D.   On: 03/23/2014 08:05    Scheduled Meds: . cefTRIAXone (ROCEPHIN)  IV  1 g Intravenous QHS  . clopidogrel  75 mg Oral Daily  . docusate sodium  100 mg Oral BID  . enoxaparin (LOVENOX) injection  40 mg Subcutaneous  Daily  . feeding supplement (ENSURE COMPLETE)  237 mL Oral Q24H  . ferrous sulfate  325 mg Oral TID PC  . [START ON 03/25/2014] levothyroxine  50 mcg Oral QAC breakfast  . mupirocin ointment   Nasal BID   Continuous Infusions: . sodium chloride 50 mL/hr at 03/23/14 2256    Principal Problem:   Intertrochanteric fracture of right hip Active Problems:   HTN (hypertension)   Unspecified hypothyroidism   Anemia   Thrombocytopenia, unspecified   Closed right hip fracture    Time spent: 25 minutes   Jeralyn Bennett  [facility] Pager [phone]. If 8PM-8AM, please contact night-coverage at [email or web address], password [facility] 03/24/2014, 3:03 PM  LOS: 1 day

## 2014-03-24 NOTE — Evaluation (Signed)
Physical Therapy Evaluation Patient Details Name: [name] MRN: [id number] DOB: 1934/02/20 Today's Date: 03/24/2014   History of Present Illness  R hip fx s/p fall at home.  ORIF with PWB 50%  Clinical Impression  Pt s/p R hip fx with ORIF repair presents with decreased R LE strength/ROM, post op pain and dizziness with attempts to mobilize limiting functional mobility.  Pt would benefit from follow up rehab at SNF level to maximize IND and safety prior to return home with ltd assist    Follow Up Recommendations SNF    Equipment Recommendations  None recommended by PT    Recommendations for Other Services OT consult     Precautions / Restrictions Precautions Precautions: Fall Restrictions Weight Bearing Restrictions: Yes RLE Weight Bearing: Partial weight bearing RLE Partial Weight Bearing Percentage or Pounds: 50%      Mobility  Bed Mobility Overal bed mobility: Needs Assistance Bed Mobility: Supine to Sit;Sit to Supine     Supine to sit: +2 for physical assistance;Mod assist Sit to supine: Max assist;+2 for physical assistance   General bed mobility comments: Pt assisted to/from EOB with drop pad. Pt becoming unresponsive in bedside sitting and returned to supine max assist  Transfers Overall transfer level:  (NT - pt becoming unresponsive sitting EOB)                  Ambulation/Gait                Stairs            Wheelchair Mobility    Modified Rankin (Stroke Patients Only)       Balance                                             Pertinent Vitals/Pain 6/10; premed, ice pack provided    Home Living Family/patient expects to be discharged to:: Skilled nursing facility                 Additional Comments: [facility]    Prior Function Level of Independence: Independent               Hand Dominance   Dominant Hand: Right    Extremity/Trunk Assessment   Upper Extremity Assessment:  Overall WFL for tasks assessed           Lower Extremity Assessment: RLE deficits/detail RLE Deficits / Details: 2-/5 hip strength with AAROM at hip to 50 flex and 10 abd    Cervical / Trunk Assessment: Normal  Communication   Communication: No difficulties  Cognition Arousal/Alertness: Awake/alert Behavior During Therapy: WFL for tasks assessed/performed Overall Cognitive Status: Within Functional Limits for tasks assessed                      General Comments      Exercises Total Joint Exercises Ankle Circles/Pumps: AROM;Supine;15 reps;Both Quad Sets: AROM;Both;10 reps;Supine Heel Slides: AAROM;Right;10 reps;Supine Hip ABduction/ADduction: AAROM;Right;10 reps;Supine      Assessment/Plan    PT Assessment Patient needs continued PT services  PT Diagnosis Difficulty walking   PT Problem List Decreased strength;Decreased range of motion;Decreased activity tolerance;Decreased mobility;Decreased knowledge of use of DME;Obesity;Pain;Decreased knowledge of precautions  PT Treatment Interventions DME instruction;Gait training;Functional mobility training;Therapeutic activities;Therapeutic exercise;Patient/family education   PT Goals (Current goals can be found in the Care Plan section) Acute Rehab PT  Goals Patient Stated Goal: Rehab at SNF and then home to resume previous lifestyle PT Goal Formulation: With patient Time For Goal Achievement: 03/31/14 Potential to Achieve Goals: Good    Frequency Min 3X/week   Barriers to discharge        Co-evaluation               End of Session Equipment Utilized During Treatment: Gait belt Activity Tolerance: Other (comment) (Pt becoming unresponsive in sitting) Patient left: in bed;with nursing/sitter in room;with call bell/phone within reach Nurse Communication: Mobility status;Other (comment)         Time: 6962-9528 PT Time Calculation (min): 37 min   Charges:   PT Evaluation $Initial PT Evaluation Tier  I: 1 Procedure PT Treatments $Therapeutic Exercise: 8-22 mins $Therapeutic Activity: 8-22 mins   PT G Codes:          Zariyah Stephens 03/24/2014, 1:04 PM

## 2014-03-24 NOTE — Progress Notes (Signed)
This patient is receiving IV Synthroid. Based on criteria approved by the Pharmacy and Therapeutics Committee, this medication is being converted to the equivalent oral dose form. These criteria include:   . The patient is eating (either orally or per tube) and/or has been taking other orally administered medications for at least 24 hours.  . This patient has no evidence of active gastrointestinal bleeding or impaired GI absorption (gastrectomy, short bowel, patient on TNA or NPO).   If you have questions about this conversion, please contact the pharmacy department.  Hershal Coria, [facility] 03/24/2014 12:37 PM

## 2014-03-24 NOTE — Progress Notes (Addendum)
   Subjective: 1 Day Post-Op Procedure(s) (LRB): INTRAMEDULLARY (IM) NAIL FEMORAL (Right)   Patient reports pain as mild, pain appears to be controlled. No events throughout the night.  Hopefully SNF place for short period to help get her up to speed.  Son had recent back surgery and unable to fully care for her at home.  Objective:   VITALS:   Filed Vitals:   03/24/14 0432  BP: 133/78  Pulse: 105  Temp: 100.6 F (38.1 C)  Resp: 18    Dorsiflexion/Plantar flexion intact Incision: dressing C/D/I No cellulitis present Compartment soft  LABS  Recent Labs  03/23/14 0509 03/24/14 0544  HGB 11.4* 10.4*  HCT 33.9* 30.2*  WBC 6.3 10.4  PLT 88* 121*     Recent Labs  03/23/14 0509 03/24/14 0544  NA 136* 134*  K 4.4 5.5*  BUN 13 16  CREATININE 0.94 1.12*  GLUCOSE 140* 162*     Assessment/Plan: 1 Day Post-Op Procedure(s) (LRB): INTRAMEDULLARY (IM) NAIL FEMORAL (Right) Up with therapy Discharge to SNF eventually, when ready medically.   Ortho recommendations:  May resume home Plavix for anticoagulation.  Norco for pain management (Rx written).  MiraLax and Colace for constipation  Iron 325 mg tid for 2-3 weeks   WB 50% on the right leg.  Dressing to remain in place until follow in clinic in 2 weeks.  Dressing is waterproof and may shower with it in place.  Follow up in 2 weeks at [facility]. Follow up with OLIN,Korryn Pancoast D in 2 weeks.  Contact information:  [facility] [address], [address] [place] Domitila Stetler   PAC  03/24/2014, 8:52 AM

## 2014-03-24 NOTE — Progress Notes (Signed)
Clinical Social Work Department CLINICAL SOCIAL WORK PLACEMENT NOTE 03/24/2014  Patient:  [name], [name]  Account Number:  [id number] Admit date:  03/23/2014  Clinical Social Worker:  Werner Lean, LCSW  Date/time:  03/23/2014 02:41 PM  Clinical Social Work is seeking post-discharge placement for this patient at the following level of care:   SKILLED NURSING   (*CSW will update this form in Epic as items are completed)     Patient/family provided with [place] Department of Clinical Social Work's list of facilities offering this level of care within the geographic area requested by the patient (or if unable, by the patient's family).  03/23/2014  Patient/family informed of their freedom to choose among providers that offer the needed level of care, that participate in Medicare, Medicaid or managed care program needed by the patient, have an available bed and are willing to accept the patient.    Patient/family informed of [facility]' ownership interest in [facility], as well as of the fact that they are under no obligation to receive care at this facility.  PASARR submitted to EDS on 03/23/2014 PASARR number received on 03/23/2014  FL2 transmitted to all facilities in geographic area requested by pt/family on  03/23/2014 FL2 transmitted to all facilities within larger geographic area on   Patient informed that his/her managed care company has contracts with or will negotiate with  certain facilities, including the following:     Patient/family informed of bed offers received:  03/23/2014 Patient chooses bed at [place], [facility] Physician recommends and patient chooses bed at  Patient to be transferred to  on   Patient to be transferred to facility by  Patient and family notified of transfer on  Name of family member notified:    The following physician request were entered in Epic:   Additional Comments:  Werner Lean

## 2014-03-25 DIAGNOSIS — D62 Acute posthemorrhagic anemia: Secondary | ICD-10-CM

## 2014-03-25 LAB — CBC
HCT: 21.8 % — ABNORMAL LOW (ref 36.0–46.0)
HEMOGLOBIN: 7.8 g/dL — AB (ref 12.0–15.0)
MCH: 32.1 pg (ref 26.0–34.0)
MCHC: 35.8 g/dL (ref 30.0–36.0)
MCV: 89.7 fL (ref 78.0–100.0)
Platelets: 88 10*3/uL — ABNORMAL LOW (ref 150–400)
RBC: 2.43 MIL/uL — AB (ref 3.87–5.11)
RDW: 13.1 % (ref 11.5–15.5)
WBC: 8.5 10*3/uL (ref 4.0–10.5)

## 2014-03-25 LAB — BASIC METABOLIC PANEL
Anion gap: 13 (ref 5–15)
BUN: 23 mg/dL (ref 6–23)
CO2: 23 meq/L (ref 19–32)
CREATININE: 1.04 mg/dL (ref 0.50–1.10)
Calcium: 8.5 mg/dL (ref 8.4–10.5)
Chloride: 96 mEq/L (ref 96–112)
GFR calc Af Amer: 59 mL/min — ABNORMAL LOW (ref >90)
GFR, EST NON AFRICAN AMERICAN: 50 mL/min — AB (ref >90)
GLUCOSE: 131 mg/dL — AB (ref 70–99)
Potassium: 4.1 mEq/L (ref 3.7–5.3)
SODIUM: 132 meq/L — AB (ref 137–147)

## 2014-03-25 LAB — PREPARE RBC (CROSSMATCH)

## 2014-03-25 MED ORDER — ACETAMINOPHEN 325 MG PO TABS
650.0000 mg | ORAL_TABLET | Freq: Once | ORAL | Status: AC
  Administered 2014-03-25: 650 mg via ORAL
  Filled 2014-03-25: qty 2

## 2014-03-25 NOTE — Progress Notes (Signed)
[facility] PROGRESS NOTE  [name] GNF:[id number] DOB: 23-Mar-1934 DOA: 03/23/2014 PCP: Tammy Fusi, MD  Assessment/Plan: Principal Problem:   Intertrochanteric fracture of right hip Active Problems:   HTN (hypertension)   Unspecified hypothyroidism   Anemia   Thrombocytopenia, unspecified   Closed right hip fracture    1. Right hip fracture status post mechanical fall -  S/P open reduction internal fixation of right intertrochanteric femur fracture, procedure performed by Dr Charlann Boxer on 03/23/2014 2. Acute Blood Loss Anemia-Hg trending down from 10.4 on 03/24/2014 to 7.8 on 03/25/2014. She reported feeling dizzy on sitting up. Given presence of symptoms will type and cross and transfuse 1 unit of PRBC's.  3. Hypertension - Blood pressures low/normal, will hold anihypertensive agents.  4. Hypothyroidism - Continue Synthroid 50 mcg Po q daily 5. Chronic anemia and thrombocytopenia - closely follow CBC. 6. Abnormal chest x-ray - chest x-ray done in the ER at [facility] showed chronic interstitial markings. Patient was advised she will need further followup as outpatient. 7. History of CVA - continue Plavix  8. Possible UTI - UA were showing leukocyte esterase positive and bacteria. Will transition to oral Ceftin.     Code Status: full Family Communication: Family updated about patient's clinical progress Disposition Plan:  Plan for discharge to SNF when stable   Brief narrative: [name] is a 80 y.o. female history of hypertension, CVA, hyperlipidemia had a fall at her house when she tripped on her rug. Patient denies having hit her head or loosing consciousness. X-rays show right intertrochanteric fracture and patient was transferred to [facility] at patient's request. This patient denies any chest pain or shortness of breath. Chest x-ray is done shows chronic interstitial markings. Cardiac markers liver function tests and metabolic panel were  largely unremarkable. Patient's CBC showed chronic anemia and thrombocytopenia with urinalysis showing possible urinary tract infection. Patient was admitted last May for splenic artery aneurysm.    Consultants:  Orthopedics  Procedures: OR today for IM nail R femur by Dr. Shelle Iron   Antibiotics:  Rocephin  HPI/Subjective: Patient complains of right hip pain, s/p orthopedic repair. She reported feeling dizzy/lightheaded with sitting up.  Will transfuse 1 unit of PRBC's today  Objective: Filed Vitals:   03/24/14 2330 03/25/14 0611 03/25/14 1242 03/25/14 1305  BP:  103/56 128/58 97/58  Pulse:  99 99 102  Temp:  98.7 F (37.1 C) 99 F (37.2 C) 98.7 F (37.1 C)  TempSrc:  Oral Oral Oral  Resp: 16 16 16 16   Height:      Weight:      SpO2:  92% 96% 96%    Intake/Output Summary (Last 24 hours) at 03/25/14 1347 Last data filed at 03/25/14 1243  Gross per 24 hour  Intake 1889.17 ml  Output   1325 ml  Net 564.17 ml    Exam:  HENT:  Head: Atraumatic.  Nose: Nose normal.  Mouth/Throat: Oropharynx is clear and moist.  Eyes: Conjunctivae are normal. Pupils are equal, round, and reactive to light. No scleral icterus.  Neck: Neck supple. No tracheal deviation present.  Cardiovascular: Normal rate, regular rhythm, normal heart sounds and intact distal pulses.  Pulmonary/Chest: Effort normal and breath sounds normal. No respiratory distress.  Abdominal: Soft. Normal appearance and bowel sounds are normal. She exhibits no distension. There is no tenderness.  Musculoskeletal: Surgical incision site appears clean, no evidence of infection R knee scar s/p TKA. R knee nontender Pain R hip/groin with ROM No  calf pain NVI distally good cap refill   .  Neurological: She is alert. No cranial nerve deficit.    Data Reviewed: Basic Metabolic Panel:  Recent Labs Lab 03/23/14 0509 03/24/14 0544 03/25/14 0454  NA 136* 134* 132*  K 4.4 5.5* 4.1  CL 101 100 96  CO2 22 20 23    GLUCOSE 140* 162* 131*  BUN 13 16 23   CREATININE 0.94 1.12* 1.04  CALCIUM 9.8 9.1 8.5    Liver Function Tests:  Recent Labs Lab 03/23/14 0509  AST 28  ALT 21  ALKPHOS 73  BILITOT 0.8  PROT 6.9  ALBUMIN 3.9   No results found for this basename: LIPASE, AMYLASE,  in the last 168 hours No results found for this basename: AMMONIA,  in the last 168 hours  CBC:  Recent Labs Lab 03/23/14 0509 03/24/14 0544 03/25/14 0454  WBC 6.3 10.4 8.5  NEUTROABS 4.8  --   --   HGB 11.4* 10.4* 7.8*  HCT 33.9* 30.2* 21.8*  MCV 91.4 91.2 89.7  PLT 88* 121* 88*    Cardiac Enzymes:  Recent Labs Lab 03/23/14 0509  TROPONINI <0.30   BNP (last 3 results) No results found for this basename: PROBNP,  in the last 8760 hours   CBG: No results found for this basename: GLUCAP,  in the last 168 hours  Recent Results (from the past 240 hour(s))  URINE CULTURE     Status: None   Collection Time    03/23/14  4:03 AM      Result Value Ref Range Status   Specimen Description URINE, CATHETERIZED   Final   Special Requests NONE   Final   Culture  Setup Time     Final   Value: 03/23/2014 08:27     Performed at [facility] Count     Final   Value: NO GROWTH     Performed at [facility]   Culture     Final   Value: NO GROWTH     Performed at [facility]   Report Status 03/24/2014 FINAL   Final  SURGICAL PCR SCREEN     Status: Abnormal   Collection Time    03/23/14  5:00 AM      Result Value Ref Range Status   MRSA, PCR NEGATIVE  NEGATIVE Final   Staphylococcus aureus POSITIVE (*) NEGATIVE Final   Comment:            The Xpert SA Assay (FDA     approved for NASAL specimens     in patients over 2 years of age),     is one component of     a comprehensive surveillance     program.  Test performance has     been validated by [employer] for patients greater     than or equal to 67 year old.     It is not intended     to diagnose infection  nor to     guide or monitor treatment.     Studies: Dg Chest Portable 1 View  03/23/2014   CLINICAL DATA:  Preop, evaluate for infiltrates  EXAM: PORTABLE CHEST - 1 VIEW  COMPARISON:  Chest x-ray of 03/22/2014, and 03/23/2010  FINDINGS: The lungs remain hyperaerated consistent with emphysema. Coarse and prominent interstitial lung markings are stable bilaterally most consistent with chronic interstitial lung disease. Mild cardiomegaly is stable.  IMPRESSION: Stable emphysema and chronic  interstitial change. No active process. Stable cardiomegaly.   Electronically Signed   By: Dwyane Dee M.D.   On: 03/23/2014 08:05    Scheduled Meds: . cefTRIAXone (ROCEPHIN)  IV  1 g Intravenous QHS  . clopidogrel  75 mg Oral Daily  . docusate sodium  100 mg Oral BID  . enoxaparin (LOVENOX) injection  40 mg Subcutaneous Daily  . feeding supplement (ENSURE COMPLETE)  237 mL Oral Q24H  . ferrous sulfate  325 mg Oral TID PC  . levothyroxine  50 mcg Oral QAC breakfast  . mupirocin ointment   Nasal BID   Continuous Infusions: . sodium chloride 75 mL/hr at 03/24/14 1806    Principal Problem:   Intertrochanteric fracture of right hip Active Problems:   HTN (hypertension)   Unspecified hypothyroidism   Anemia   Thrombocytopenia, unspecified   Closed right hip fracture    Time spent: 25 minutes   Jeralyn Bennett  [facility] Pager [phone]. If 8PM-8AM, please contact night-coverage at [email or web address], password [facility] 03/25/2014, 1:47 PM  LOS: 2 days

## 2014-03-25 NOTE — Progress Notes (Signed)
Patient ID: [name], female   DOB: 1934-07-06, 80 y.o.   MRN: [id number] Subjective: 2 Days Post-Op Procedure(s) (LRB): INTRAMEDULLARY (IM) NAIL FEMORAL (Right)    Patient reports pain as mild.  Reported vagal response yesterday but she states she feels a lot better  Objective:   VITALS:   Filed Vitals:   03/25/14 0611  BP: 103/56  Pulse: 99  Temp: 98.7 F (37.1 C)  Resp: 16    Neurovascular intact Incision: moderate drainage with a lot of bruising around her hip  LABS  Recent Labs  03/23/14 0509 03/24/14 0544 03/25/14 0454  HGB 11.4* 10.4* 7.8*  HCT 33.9* 30.2* 21.8*  WBC 6.3 10.4 8.5  PLT 88* 121* 88*     Recent Labs  03/23/14 0509 03/24/14 0544 03/25/14 0454  NA 136* 134* 132*  K 4.4 5.5* 4.1  BUN 13 16 23   CREATININE 0.94 1.12* 1.04  GLUCOSE 140* 162* 131*     Recent Labs  03/23/14 0509  INR 1.25     Assessment/Plan: 2 Days Post-Op Procedure(s) (LRB): INTRAMEDULLARY (IM) NAIL FEMORAL (Right)   Advance diet Up with therapy Discharge to SNF when medically stable If persistently symptomatic today with activity then consider transfusion per meidcine Dressing change to right hip today

## 2014-03-25 NOTE — Progress Notes (Signed)
Physical Therapy Treatment Patient Details Name: [name] MRN: [id number] DOB: 18-May-1934 Today's Date: 2014/04/13    History of Present Illness R hip fx s/p fall at home.  ORIF with PWB 50%    PT Comments    Pt very motivated this am but unable to progress beyond sitting EOB 2* increasing dizziness with increased time in seated position.    Follow Up Recommendations  SNF     Equipment Recommendations  None recommended by PT    Recommendations for Other Services OT consult     Precautions / Restrictions Precautions Precautions: Fall Restrictions Weight Bearing Restrictions: Yes RLE Weight Bearing: Partial weight bearing RLE Partial Weight Bearing Percentage or Pounds: 50%    Mobility  Bed Mobility Overal bed mobility: Needs Assistance Bed Mobility: Supine to Sit;Sit to Supine     Supine to sit: +2 for physical assistance;Min assist;Mod assist Sit to supine: Max assist;+2 for physical assistance   General bed mobility comments: Pt assisted to/from EOB with drop pad. Pt becoming increasingly dizzy in bedside sitting and returned to supine max assist  Transfers Overall transfer level:  (NT 2* extreme dizziness in sitting)                  Ambulation/Gait                 Stairs            Wheelchair Mobility    Modified Rankin (Stroke Patients Only)       Balance                                    Cognition Arousal/Alertness: Awake/alert Behavior During Therapy: WFL for tasks assessed/performed Overall Cognitive Status: Within Functional Limits for tasks assessed                      Exercises Total Joint Exercises Ankle Circles/Pumps: AROM;Supine;15 reps;Both Quad Sets: AROM;Both;10 reps;Supine Heel Slides: AAROM;Right;Supine;15 reps Hip ABduction/ADduction: AAROM;Right;Supine;15 reps    General Comments        Pertinent Vitals/Pain 4/10; premed; ice packs provided.    Home Living                       Prior Function            PT Goals (current goals can now be found in the care plan section) Acute Rehab PT Goals Patient Stated Goal: Rehab at SNF and then home to resume previous lifestyle PT Goal Formulation: With patient Time For Goal Achievement: 03/31/14 Potential to Achieve Goals: Good Progress towards PT goals: Progressing toward goals    Frequency  Min 3X/week    PT Plan Current plan remains appropriate    Co-evaluation             End of Session Equipment Utilized During Treatment: Gait belt Activity Tolerance: Other (comment) (dizziness in sitting) Patient left: in bed;with nursing/sitter in room;with call bell/phone within reach     Time: 1010-1041 PT Time Calculation (min): 31 min  Charges:  $Therapeutic Exercise: 8-22 mins $Therapeutic Activity: 8-22 mins                    G Codes:      [name] 2014-04-13, 12:33 PM

## 2014-03-26 LAB — CBC
HEMATOCRIT: 21.6 % — AB (ref 36.0–46.0)
HEMOGLOBIN: 7.6 g/dL — AB (ref 12.0–15.0)
MCH: 31.4 pg (ref 26.0–34.0)
MCHC: 35.2 g/dL (ref 30.0–36.0)
MCV: 89.3 fL (ref 78.0–100.0)
Platelets: 83 10*3/uL — ABNORMAL LOW (ref 150–400)
RBC: 2.42 MIL/uL — ABNORMAL LOW (ref 3.87–5.11)
RDW: 13.5 % (ref 11.5–15.5)
WBC: 6.5 10*3/uL (ref 4.0–10.5)

## 2014-03-26 LAB — BASIC METABOLIC PANEL
Anion gap: 11 (ref 5–15)
BUN: 21 mg/dL (ref 6–23)
CO2: 21 mEq/L (ref 19–32)
CREATININE: 0.78 mg/dL (ref 0.50–1.10)
Calcium: 8.5 mg/dL (ref 8.4–10.5)
Chloride: 101 mEq/L (ref 96–112)
GFR, EST NON AFRICAN AMERICAN: 79 mL/min — AB (ref >90)
Glucose, Bld: 136 mg/dL — ABNORMAL HIGH (ref 70–99)
Potassium: 4.4 mEq/L (ref 3.7–5.3)
Sodium: 133 mEq/L — ABNORMAL LOW (ref 137–147)

## 2014-03-26 LAB — PREPARE RBC (CROSSMATCH)

## 2014-03-26 NOTE — Discharge Instructions (Signed)
Partial weight bearing right lower extremity until further directed Dressing changes to right hip as needed

## 2014-03-26 NOTE — Progress Notes (Signed)
Physical Therapy Treatment Patient Details Name: BRYCELYN RAK MRN: [id number] DOB: June 19, 1934 Today's Date: 03/26/2014    History of Present Illness R hip fx s/p fall at home.  ORIF with PWB 50%    PT Comments    Pt with no complaints of dizziness but c/o generalized weakness.  Pt requiring increased time and +2 assist for all mobility tasks and unable progress to ambulation 2* inability to sufficiently WB on R LE and UEs to advance L LE.  Follow Up Recommendations  SNF     Equipment Recommendations  None recommended by PT    Recommendations for Other Services OT consult     Precautions / Restrictions Precautions Precautions: Fall Restrictions Weight Bearing Restrictions: Yes RLE Weight Bearing: Partial weight bearing RLE Partial Weight Bearing Percentage or Pounds: 50%    Mobility  Bed Mobility Overal bed mobility: Needs Assistance Bed Mobility: Supine to Sit     Supine to sit: +2 for physical assistance;Min assist;Mod assist     General bed mobility comments: Pt assisted to/from EOB with drop pad.  Transfers Overall transfer level: Needs assistance Equipment used: Rolling walker (2 wheeled) Transfers: Sit to/from Stand Sit to Stand: Mod assist;+2 physical assistance;From elevated surface;+2 safety/equipment Stand pivot transfers: Mod assist;Max assist;+2 physical assistance;+2 safety/equipment       General transfer comment: cues for LE mangement and use of UEs to self assist; Pt performed stand/pvt transfer with RW and max assist of 2.    Ambulation/Gait Ambulation/Gait assistance:  (Pt unable to WB on R LE and UEs sufficiently to advance L LE)               Stairs            Wheelchair Mobility    Modified Rankin (Stroke Patients Only)       Balance                                    Cognition Arousal/Alertness: Awake/alert Behavior During Therapy: WFL for tasks assessed/performed Overall Cognitive Status:  Within Functional Limits for tasks assessed                      Exercises      General Comments        Pertinent Vitals/Pain 4/10    Home Living                      Prior Function            PT Goals (current goals can now be found in the care plan section) Acute Rehab PT Goals Patient Stated Goal: Rehab at SNF and then home to resume previous lifestyle PT Goal Formulation: With patient Time For Goal Achievement: 03/31/14 Potential to Achieve Goals: Good Progress towards PT goals: Progressing toward goals    Frequency  Min 3X/week    PT Plan Current plan remains appropriate    Co-evaluation             End of Session Equipment Utilized During Treatment: Gait belt Activity Tolerance: Patient limited by fatigue Patient left: in chair;with call bell/phone within reach     Time: 1222-1246 PT Time Calculation (min): 24 min  Charges:  $Therapeutic Activity: 23-37 mins                    G Codes:  Karolee Meloni 03/26/2014, 1:05 PM

## 2014-03-26 NOTE — Progress Notes (Signed)
CARE MANAGEMENT NOTE 03/26/2014  Patient:  [name], [name]   Account Number:  [id number]  Date Initiated:  03/23/2014  Documentation initiated by:  DAVIS,RHONDA  Subjective/Objective Assessment:   pt with fracture of the hip after mechanical fall/for or [id number] for orif     Action/Plan:   patient lives alone may need snf for st rehab placement   Anticipated DC Date:  03/29/2014   Anticipated DC Plan:  [place]  In-house referral  Clinical Social Worker         Choice offered to / List presented to:             Status of service:  In process, will continue to follow Medicare Important Message given?  NA - LOS <3 / Initial given by admissions (If response is "NO", the following Medicare IM given date fields will be blank) Date Medicare IM given:  03/26/2014 Medicare IM given by:  DAVIS,RHONDA Date Additional Medicare IM given:   Additional Medicare IM given by:    Discharge Disposition:    Per UR Regulation:  Reviewed for med. necessity/level of care/duration of stay  If discussed at [place] of Stay Meetings, dates discussed:    Comments:  [id number]/Rhonda Victorio Palm: [phone] Chart reviewed for patient status and needs. No discharge needs present at time of review.  Patient recieving unit of [facility] [id number] poss dcd to snf on [id number] Next review due on 072015.   [id number]/VVZSMO Eldridge Dace, [place], [place] [phone] Chart Reviewed for discharge and hospital needs. Pt expected to [facility] to [facility] [phone] will follow for [facility] needs versus st rehab placment. Discharge needs at time of review: None present will follow for needs. Review of patient progress due on [id number]

## 2014-03-26 NOTE — Progress Notes (Signed)
Physical Therapy Treatment Patient Details Name: [name] MRN: [id number] DOB: 1933/09/27 Today's Date: 05-Apr-2014    History of Present Illness R hip fx s/p fall at home.  ORIF with PWB 50%    PT Comments    Pt motivated - OOB deferred to after first unit RBC - Hgb @ 7.6  Follow Up Recommendations  SNF     Equipment Recommendations  None recommended by PT    Recommendations for Other Services OT consult     Precautions / Restrictions Precautions Precautions: Fall Restrictions Weight Bearing Restrictions: Yes RLE Weight Bearing: Partial weight bearing RLE Partial Weight Bearing Percentage or Pounds: 50%    Mobility  Bed Mobility Overal bed mobility:  (deferred to after first unit RBC)                Transfers                    Ambulation/Gait                 Stairs            Wheelchair Mobility    Modified Rankin (Stroke Patients Only)       Balance                                    Cognition Arousal/Alertness: Awake/alert Behavior During Therapy: WFL for tasks assessed/performed Overall Cognitive Status: Within Functional Limits for tasks assessed                      Exercises Total Joint Exercises Ankle Circles/Pumps: AROM;Supine;15 reps;Both Quad Sets: AROM;Both;10 reps;Supine Gluteal Sets: AROM;Both;10 reps;Supine Short Arc Quad: AAROM;Right;10 reps;Supine Heel Slides: AAROM;Right;Supine;20 reps Hip ABduction/ADduction: AAROM;Right;Supine;15 reps    General Comments        Pertinent Vitals/Pain 4/10; premed; ice pack provided    Home Living                      Prior Function            PT Goals (current goals can now be found in the care plan section) Acute Rehab PT Goals Patient Stated Goal: Rehab at SNF and then home to resume previous lifestyle PT Goal Formulation: With patient Time For Goal Achievement: 03/31/14 Potential to Achieve Goals:  Good Progress towards PT goals: Progressing toward goals    Frequency  Min 3X/week    PT Plan Current plan remains appropriate    Co-evaluation             End of Session   Activity Tolerance: Patient tolerated treatment well Patient left: in bed;with nursing/sitter in room;with call bell/phone within reach     Time: 0813-0835 PT Time Calculation (min): 22 min  Charges:  $Therapeutic Exercise: 8-22 mins                    G Codes:      [name] 04/05/2014, 9:52 AM

## 2014-03-26 NOTE — Progress Notes (Signed)
Physical Therapy Treatment Patient Details Name: [name] MRN: [id number] DOB: [birth year] Today's Date: April 11, 2014    History of Present Illness R hip fx s/p fall at home.  ORIF with PWB 50%    PT Comments    Pt continues motivated but with limited endurance and UE strength  Follow Up Recommendations  SNF     Equipment Recommendations  None recommended by PT    Recommendations for Other Services OT consult     Precautions / Restrictions Precautions Precautions: Fall Restrictions Weight Bearing Restrictions: Yes RLE Weight Bearing: Partial weight bearing RLE Partial Weight Bearing Percentage or Pounds: 50%    Mobility  Bed Mobility Overal bed mobility: Needs Assistance Bed Mobility: Sit to Supine       Sit to supine: Mod assist;+2 for physical assistance   General bed mobility comments: cues for sequence and use of L LE to self assist  Transfers Overall transfer level: Needs assistance Equipment used: Rolling walker (2 wheeled) Transfers: Sit to/from Stand Sit to Stand: Mod assist;+2 physical assistance;From elevated surface;+2 safety/equipment Stand pivot transfers: Mod assist;+2 physical assistance;+2 safety/equipment       General transfer comment: cues for LE mangement and use of UEs to self assist; Pt performed stand/pvt transfer with RW and max assist of 2.    Ambulation/Gait                 Stairs            Wheelchair Mobility    Modified Rankin (Stroke Patients Only)       Balance                                    Cognition Arousal/Alertness: Awake/alert Behavior During Therapy: WFL for tasks assessed/performed Overall Cognitive Status: Within Functional Limits for tasks assessed                      Exercises      General Comments        Pertinent Vitals/Pain 6/10; premed, RN aware, ice pack provided    Home Living                      Prior Function            PT  Goals (current goals can now be found in the care plan section) Acute Rehab PT Goals Patient Stated Goal: Rehab at SNF and then home to resume previous lifestyle PT Goal Formulation: With patient Time For Goal Achievement: 03/31/14 Potential to Achieve Goals: Good Progress towards PT goals: Progressing toward goals    Frequency  Min 3X/week    PT Plan Current plan remains appropriate    Co-evaluation             End of Session Equipment Utilized During Treatment: Gait belt Activity Tolerance: Patient limited by fatigue Patient left: in bed;with call bell/phone within reach     Time: 4034-7425 PT Time Calculation (min): 20 min  Charges:  $Therapeutic Activity: 8-22 mins                    G Codes:      [name] 04/11/2014, 5:59 PM

## 2014-03-26 NOTE — Progress Notes (Signed)
CSW assisting with d/c planning. [facility] is able to accept pt this weekend if stable for d/c. D/C Summary is not needed until day of d/c. MD is aware. Weekend CSW will assist with d/c planning to SNF.  Werner Lean LCSW [phone]

## 2014-03-26 NOTE — Anesthesia Postprocedure Evaluation (Signed)
  Anesthesia Post-op Note  Patient: [name]  Procedure(s) Performed: Procedure(s) (LRB): INTRAMEDULLARY (IM) NAIL FEMORAL (Right)  Patient Location: PACU  Anesthesia Type: General  Level of Consciousness: awake and alert   Airway and Oxygen Therapy: Patient Spontanous Breathing  Post-op Pain: mild  Post-op Assessment: Post-op Vital signs reviewed, Patient's Cardiovascular Status Stable, Respiratory Function Stable, Patent Airway and No signs of Nausea or vomiting  Last Vitals:  Filed Vitals:   03/26/14 0736  BP:   Pulse:   Temp:   Resp: 16    Post-op Vital Signs: stable   Complications: No apparent anesthesia complications

## 2014-03-26 NOTE — Progress Notes (Signed)
Patient ID: [name], female   DOB: May 20, 1934, 80 y.o.   MRN: [id number] Subjective: 3 Days Post-Op Procedure(s) (LRB): INTRAMEDULLARY (IM) NAIL FEMORAL (Right)    Patient reports pain as moderate.  Icing right hip this am.  Feels that she is making some progress  Objective:   VITALS:   Filed Vitals:   03/26/14 0622  BP: 143/61  Pulse: 81  Temp: 98.6 F (37 C)  Resp: 18    Neurovascular intact Incision: scant drainage, right hip  LABS  Recent Labs  03/24/14 0544 03/25/14 0454 03/26/14 0513  HGB 10.4* 7.8* 7.6*  HCT 30.2* 21.8* 21.6*  WBC 10.4 8.5 6.5  PLT 121* 88* 83*     Recent Labs  03/24/14 0544 03/25/14 0454 03/26/14 0513  NA 134* 132* 133*  K 5.5* 4.1 4.4  BUN 16 23 21   CREATININE 1.12* 1.04 0.78  GLUCOSE 162* 131* 136*    No results found for this basename: LABPT, INR,  in the last 72 hours   Assessment/Plan: 3 Days Post-Op Procedure(s) (LRB): INTRAMEDULLARY (IM) NAIL FEMORAL (Right)   Up with therapy Discharge to SNF when medically stable RTC in 2 weeks

## 2014-03-26 NOTE — Progress Notes (Signed)
[facility] PROGRESS NOTE  [name] WUJ:[id number] DOB: 1934-03-16 DOA: 03/23/2014 PCP: Paulina Fusi, MD  Assessment/Plan: Principal Problem:   Intertrochanteric fracture of right hip Active Problems:   HTN (hypertension)   Unspecified hypothyroidism   Anemia   Thrombocytopenia, unspecified   Closed right hip fracture    1. Right hip fracture status post mechanical fall -  S/P open reduction internal fixation of right intertrochanteric femur fracture, procedure performed by Dr Charlann Boxer on 03/23/2014 2. Acute Blood Loss Anemia-Hg trending down from 10.4 on 03/24/2014 to 7.8 on 03/25/2014. She was transfused with 1 unit of blood. On review of labs, her Hg further trended down to 7.6. She will be transfused 2 units of blood today. Surgical incision site having surrounding ecchymosis. Orthopedic surgery following.    3. Hypertension - Blood pressures stable, will hold anihypertensive agents.  4. Hypothyroidism - Continue Synthroid 50 mcg Po q daily 5. Abnormal chest x-ray - chest x-ray done in the ER at [facility] showed chronic interstitial markings. Patient was advised she will need further followup as outpatient. 6. History of CVA - continue Plavix  7. Possible UTI - UA were showing leukocyte esterase positive and bacteria. Will transition to oral Ceftin.     Code Status: full Family Communication: Family updated about patient's clinical progress Disposition Plan:  Plan for discharge to SNF when stable   Brief narrative: [name] is a 80 y.o. female history of hypertension, CVA, hyperlipidemia had a fall at her house when she tripped on her rug. Patient denies having hit her head or loosing consciousness. X-rays show right intertrochanteric fracture and patient was transferred to [facility] at patient's request. This patient denies any chest pain or shortness of breath. Chest x-ray is done shows chronic interstitial markings. Cardiac markers  liver function tests and metabolic panel were largely unremarkable. Patient's CBC showed chronic anemia and thrombocytopenia with urinalysis showing possible urinary tract infection. Patient was admitted last May for splenic artery aneurysm.    Consultants:  Orthopedics  Procedures: Open reduction and internal fixation of right  intertrochanteric femur fracture utilizing a [employer] AFFIXUS nail 11 x  180 mm with 115 mm lag screw and a distal interlock.    Antibiotics:  Rocephin  HPI/Subjective: Patient states feeling better after receiving blood transfusion. She thinks she is making progress.   Objective: Filed Vitals:   03/26/14 1153 03/26/14 1501 03/26/14 1620 03/26/14 1635  BP: 137/63  141/67 143/63  Pulse: 88  94 92  Temp: 98.9 F (37.2 C)  99.7 F (37.6 C) 98.9 F (37.2 C)  TempSrc: Oral  Oral Oral  Resp: 16 16 24 18   Height:      Weight:      SpO2: 100% 97% 96% 96%    Intake/Output Summary (Last 24 hours) at 03/26/14 1726 Last data filed at 03/26/14 1635  Gross per 24 hour  Intake 3730.75 ml  Output   2700 ml  Net 1030.75 ml    Exam:  HENT:  Head: Atraumatic.  Nose: Nose normal.  Mouth/Throat: Oropharynx is clear and moist.  Eyes: Conjunctivae are normal. Pupils are equal, round, and reactive to light. No scleral icterus.  Neck: Neck supple. No tracheal deviation present.  Cardiovascular: Normal rate, regular rhythm, normal heart sounds and intact distal pulses.  Pulmonary/Chest: Effort normal and breath sounds normal. No respiratory distress.  Abdominal: Soft. Normal appearance and bowel sounds are normal. She exhibits no distension. There is no tenderness.  Musculoskeletal: Surgical incision  site appears clean, no evidence of infection R knee scar s/p TKA. R knee nontender Pain R hip/groin with ROM No calf pain NVI distally good cap refill   .  Neurological: She is alert. No cranial nerve deficit.    Data Reviewed: Basic Metabolic  Panel:  Recent Labs Lab 03/23/14 0509 03/24/14 0544 03/25/14 0454 03/26/14 0513  NA 136* 134* 132* 133*  K 4.4 5.5* 4.1 4.4  CL 101 100 96 101  CO2 22 20 23 21   GLUCOSE 140* 162* 131* 136*  BUN 13 16 23 21   CREATININE 0.94 1.12* 1.04 0.78  CALCIUM 9.8 9.1 8.5 8.5    Liver Function Tests:  Recent Labs Lab 03/23/14 0509  AST 28  ALT 21  ALKPHOS 73  BILITOT 0.8  PROT 6.9  ALBUMIN 3.9   No results found for this basename: LIPASE, AMYLASE,  in the last 168 hours No results found for this basename: AMMONIA,  in the last 168 hours  CBC:  Recent Labs Lab 03/23/14 0509 03/24/14 0544 03/25/14 0454 03/26/14 0513  WBC 6.3 10.4 8.5 6.5  NEUTROABS 4.8  --   --   --   HGB 11.4* 10.4* 7.8* 7.6*  HCT 33.9* 30.2* 21.8* 21.6*  MCV 91.4 91.2 89.7 89.3  PLT 88* 121* 88* 83*    Cardiac Enzymes:  Recent Labs Lab 03/23/14 0509  TROPONINI <0.30   BNP (last 3 results) No results found for this basename: PROBNP,  in the last 8760 hours   CBG: No results found for this basename: GLUCAP,  in the last 168 hours  Recent Results (from the past 240 hour(s))  URINE CULTURE     Status: None   Collection Time    03/23/14  4:03 AM      Result Value Ref Range Status   Specimen Description URINE, CATHETERIZED   Final   Special Requests NONE   Final   Culture  Setup Time     Final   Value: 03/23/2014 08:27     Performed at [facility] Count     Final   Value: NO GROWTH     Performed at [facility]   Culture     Final   Value: NO GROWTH     Performed at [facility]   Report Status 03/24/2014 FINAL   Final  SURGICAL PCR SCREEN     Status: Abnormal   Collection Time    03/23/14  5:00 AM      Result Value Ref Range Status   MRSA, PCR NEGATIVE  NEGATIVE Final   Staphylococcus aureus POSITIVE (*) NEGATIVE Final   Comment:            The Xpert SA Assay (FDA     approved for NASAL specimens     in patients over 95 years of age),     is one  component of     a comprehensive surveillance     program.  Test performance has     been validated by [employer] for patients greater     than or equal to 63 year old.     It is not intended     to diagnose infection nor to     guide or monitor treatment.     Studies: Dg Chest Portable 1 View  03/23/2014   CLINICAL DATA:  Preop, evaluate for infiltrates  EXAM: PORTABLE CHEST - 1 VIEW  COMPARISON:  Chest x-ray of 03/22/2014, and 03/23/2010  FINDINGS: The lungs remain hyperaerated consistent with emphysema. Coarse and prominent interstitial lung markings are stable bilaterally most consistent with chronic interstitial lung disease. Mild cardiomegaly is stable.  IMPRESSION: Stable emphysema and chronic interstitial change. No active process. Stable cardiomegaly.   Electronically Signed   By: Dwyane Dee M.D.   On: 03/23/2014 08:05    Scheduled Meds: . cefTRIAXone (ROCEPHIN)  IV  1 g Intravenous QHS  . docusate sodium  100 mg Oral BID  . enoxaparin (LOVENOX) injection  40 mg Subcutaneous Daily  . feeding supplement (ENSURE COMPLETE)  237 mL Oral Q24H  . ferrous sulfate  325 mg Oral TID PC  . levothyroxine  50 mcg Oral QAC breakfast  . mupirocin ointment   Nasal BID   Continuous Infusions: . sodium chloride 75 mL/hr at 03/25/14 2035    Principal Problem:   Intertrochanteric fracture of right hip Active Problems:   HTN (hypertension)   Unspecified hypothyroidism   Anemia   Thrombocytopenia, unspecified   Closed right hip fracture    Time spent: 25 minutes   Jeralyn Bennett  [facility] Pager [phone]. If 8PM-8AM, please contact night-coverage at [email or web address], password [email or web address] 03/26/2014, 5:26 PM  LOS: 3 days

## 2014-03-27 DIAGNOSIS — G8918 Other acute postprocedural pain: Secondary | ICD-10-CM | POA: Diagnosis not present

## 2014-03-27 DIAGNOSIS — D638 Anemia in other chronic diseases classified elsewhere: Secondary | ICD-10-CM | POA: Diagnosis not present

## 2014-03-27 DIAGNOSIS — M25559 Pain in unspecified hip: Secondary | ICD-10-CM | POA: Diagnosis not present

## 2014-03-27 DIAGNOSIS — I130 Hypertensive heart and chronic kidney disease with heart failure and stage 1 through stage 4 chronic kidney disease, or unspecified chronic kidney disease: Secondary | ICD-10-CM | POA: Diagnosis not present

## 2014-03-27 DIAGNOSIS — I10 Essential (primary) hypertension: Secondary | ICD-10-CM | POA: Diagnosis not present

## 2014-03-27 DIAGNOSIS — D62 Acute posthemorrhagic anemia: Secondary | ICD-10-CM | POA: Diagnosis not present

## 2014-03-27 DIAGNOSIS — S72009D Fracture of unspecified part of neck of unspecified femur, subsequent encounter for closed fracture with routine healing: Secondary | ICD-10-CM | POA: Diagnosis not present

## 2014-03-27 DIAGNOSIS — D696 Thrombocytopenia, unspecified: Secondary | ICD-10-CM | POA: Diagnosis not present

## 2014-03-27 DIAGNOSIS — S72143A Displaced intertrochanteric fracture of unspecified femur, initial encounter for closed fracture: Secondary | ICD-10-CM | POA: Diagnosis not present

## 2014-03-27 DIAGNOSIS — D500 Iron deficiency anemia secondary to blood loss (chronic): Secondary | ICD-10-CM | POA: Diagnosis not present

## 2014-03-27 DIAGNOSIS — E039 Hypothyroidism, unspecified: Secondary | ICD-10-CM | POA: Diagnosis not present

## 2014-03-27 DIAGNOSIS — Z8673 Personal history of transient ischemic attack (TIA), and cerebral infarction without residual deficits: Secondary | ICD-10-CM | POA: Diagnosis not present

## 2014-03-27 DIAGNOSIS — N390 Urinary tract infection, site not specified: Secondary | ICD-10-CM

## 2014-03-27 DIAGNOSIS — S72009A Fracture of unspecified part of neck of unspecified femur, initial encounter for closed fracture: Secondary | ICD-10-CM | POA: Diagnosis not present

## 2014-03-27 LAB — TYPE AND SCREEN
ABO/RH(D): O POS
ANTIBODY SCREEN: NEGATIVE
Unit division: 0
Unit division: 0
Unit division: 0

## 2014-03-27 LAB — CBC
HCT: 25.9 % — ABNORMAL LOW (ref 36.0–46.0)
HEMOGLOBIN: 8.9 g/dL — AB (ref 12.0–15.0)
MCH: 30.4 pg (ref 26.0–34.0)
MCHC: 34.4 g/dL (ref 30.0–36.0)
MCV: 88.4 fL (ref 78.0–100.0)
PLATELETS: 98 10*3/uL — AB (ref 150–400)
RBC: 2.93 MIL/uL — AB (ref 3.87–5.11)
RDW: 14.5 % (ref 11.5–15.5)
WBC: 6.8 10*3/uL (ref 4.0–10.5)

## 2014-03-27 MED ORDER — ENSURE COMPLETE PO LIQD: 237.0000 mL | ORAL | Status: DC

## 2014-03-27 MED ORDER — POLYETHYLENE GLYCOL 3350 17 G PO PACK: 17.0000 g | PACK | Freq: Every day | ORAL | Status: DC | PRN

## 2014-03-27 NOTE — Discharge Summary (Signed)
Physician Discharge Summary  [name] NGE:[id number] DOB: November 20, 1933 DOA: 03/23/2014  PCP: Paulina Fusi, MD  Admit date: 03/23/2014 Discharge date: 03/27/2014  Time spent: 35 minutes  Recommendations for Outpatient Follow-up:  1. Follow up on CBC and BMP in 3-4 days  Discharge Diagnoses:  Principal Problem:   Intertrochanteric fracture of right hip Active Problems:   HTN (hypertension)   Unspecified hypothyroidism   Anemia   Thrombocytopenia, unspecified   Closed right hip fracture   Discharge Condition: Stable  Diet recommendation: Heart Healthy  Filed Weights   03/23/14 0154  Weight: 89.3 kg (196 lb 13.9 oz)    History of present illness:  [name] is a 80 y.o. female history of hypertension, CVA, hyperlipidemia had a fall at her house when she tripped on her rug. Patient denies having hit her head or loosing consciousness. X-rays show right intertrochanteric fracture and patient was transferred to [facility] at patient's request. This patient denies any chest pain or shortness of breath. Chest x-ray is done shows chronic interstitial markings. Cardiac markers liver function tests and metabolic panel were largely unremarkable. Patient's CBC showed chronic anemia and thrombocytopenia with urinalysis showing possible urinary tract infection. Patient was admitted last May for splenic artery aneurysm.   Hospital Course:  [name] is a 80 y.o. female history of hypertension, CVA, hyperlipidemia had a fall at her house when she tripped on her rug. Patient denies having hit her head or loosing consciousness. X-rays show right intertrochanteric fracture and patient was transferred to [facility] at patient's request. This patient denies any chest pain or shortness of breath. Chest x-ray is done shows chronic interstitial markings. Cardiac markers liver function tests and metabolic panel were largely unremarkable. Patient's CBC showed  chronic anemia and thrombocytopenia with urinalysis showing possible urinary tract infection. She was taken to the OR on 03/23/2014 where she underwent ORIF of right intertrochanteric femur fracture, procedure performed by Dr Charlann Boxer on 03/23/2014. Her post operative course was complicated by acute blood loss anemia for which she was transfused a total of 3 units of PRBC's. By 03/27/2014 she was doing well, Hg stable at 8.9. Patient discharged ot SNF in stable condition on 03/27/2014.   1) Right hip fracture status post mechanical fall - S/P open reduction internal fixation of right intertrochanteric femur fracture, procedure performed by Dr Charlann Boxer on 03/23/2014   2) Acute Blood Loss Anemia-Hg trending down from 10.4 on 03/24/2014 to 7.8 on 03/25/2014. She was transfused with 1 unit of blood. On review of labs, her Hg further trended down to 7.6. She will be transfused 2 units of blood today. Surgical incision site having surrounding ecchymosis. Orthopedic surgery following.  3) Hypertension - Blood pressures stable, will hold anihypertensive agents. Hypothyroidism - Continue Synthroid 50 mcg Po q daily   4) UTI - UA were showing leukocyte esterase positive and bacteria. Received 4 days of IV rocephin    Procedures: Open reduction internal fixation of right intertrochanteric femur fracture, procedure performed by Dr Charlann Boxer on 03/23/2014   Consultations:  Orthopedic Surgery  Discharge Exam: Filed Vitals:   03/27/14 0508  BP: 143/58  Pulse: 77  Temp: 98.4 F (36.9 C)  Resp: 16    General: No acute distress, awake and alert Cardiovascular: 2/6 SEM no rubs or gallops, normal S1S2 Respiratory: Clear to auscultation bilaterally Abdomen: Soft, nontender nondistended  Discharge Instructions You were cared for by a hospitalist during your hospital stay. If you have any questions about  your discharge medications or the care you received while you were in the hospital after you are discharged, you can call  the unit and asked to speak with the hospitalist on call if the hospitalist that took care of you is not available. Once you are discharged, your primary care physician will handle any further medical issues. Please note that NO REFILLS for any discharge medications will be authorized once you are discharged, as it is imperative that you return to your primary care physician (or establish a relationship with a primary care physician if you do not have one) for your aftercare needs so that they can reassess your need for medications and monitor your lab values.  Discharge Instructions   Call MD for:  difficulty breathing, headache or visual disturbances    Complete by:  As directed      Call MD for:  extreme fatigue    Complete by:  As directed      Call MD for:  persistant dizziness or light-headedness    Complete by:  As directed      Call MD for:  severe uncontrolled pain    Complete by:  As directed      Call MD for:  temperature >100.4    Complete by:  As directed      Diet - low sodium heart healthy    Complete by:  As directed      Increase activity slowly    Complete by:  As directed      Partial weight bearing    Complete by:  As directed   % Body Weight:  50  Laterality:  right  Extremity:  Lower            Medication List    STOP taking these medications       lisinopril 40 MG tablet  Commonly known as:  PRINIVIL,ZESTRIL     psyllium 58.6 % packet  Commonly known as:  METAMUCIL     sulfamethoxazole-trimethoprim 800-160 MG per tablet  Commonly known as:  BACTRIM DS      TAKE these medications       bethanechol 50 MG tablet  Commonly known as:  URECHOLINE  Take 50 mg by mouth 2 (two) times daily.     CALCIUM 500 + D PO  Take 500 mg by mouth 2 (two) times daily.     cholecalciferol 1000 UNITS tablet  Commonly known as:  VITAMIN D  Take 2,000 Units by mouth daily.     clopidogrel 75 MG tablet  Commonly known as:  PLAVIX  Take 75 mg by mouth at bedtime.      CRANBERRY PLUS VITAMIN C 140-100-3 MG-MG-UNIT Caps  Generic drug:  Cranberry-Vitamin C-Vitamin E  Take 2,000 Units by mouth 2 (two) times daily.     DSS 100 MG Caps  Take 100 mg by mouth 2 (two) times daily.     ezetimibe 10 MG tablet  Commonly known as:  ZETIA  Take 10 mg by mouth at bedtime.     feeding supplement (ENSURE COMPLETE) Liqd  Take 237 mLs by mouth daily.     ferrous sulfate 325 (65 FE) MG tablet  Take 1 tablet (325 mg total) by mouth 3 (three) times daily after meals.     Fish Oil 1000 MG Caps  Take 1,000 mg by mouth 2 (two) times daily.     folic acid 400 MCG tablet  Commonly known as:  FOLVITE  Take 400 mcg by  mouth daily.     HYDROcodone-acetaminophen 5-325 MG per tablet  Commonly known as:  NORCO/VICODIN  Take 1-2 tablets by mouth every 4 (four) hours as needed for moderate pain.     labetalol 200 MG tablet  Commonly known as:  NORMODYNE  Take 300 mg by mouth 2 (two) times daily. Take 1 1/2 tablets twice daily     levothyroxine 50 MCG tablet  Commonly known as:  SYNTHROID, LEVOTHROID  Take 50 mcg by mouth daily before breakfast.     polyethylene glycol packet  Commonly known as:  MIRALAX / GLYCOLAX  Take 17 g by mouth daily as needed for mild constipation.     tamsulosin 0.4 MG Caps capsule  Commonly known as:  FLOMAX  Take 0.4 mg by mouth 2 (two) times daily.       Allergies  Allergen Reactions  . Aspirin Other (See Comments)    Causes blood in urine       Follow-up Information   Follow up with Shelda Pal, MD. Schedule an appointment as soon as possible for a visit in 2 weeks.   Specialty:  Orthopedic Surgery   Contact information:   [address] [place] [address] [phone]       Follow up with [facility], MD In 2 weeks.   Specialty:  Internal Medicine   Contact information:   [address] [place] [address] [phone]        The results of significant diagnostics  from this hospitalization (including imaging, microbiology, ancillary and laboratory) are listed below for reference.    Significant Diagnostic Studies: Dg Hip Operative Right  Apr 13, 2014   CLINICAL DATA:  ORIF of the right hip  EXAM: DG OPERATIVE RIGHT HIP  TECHNIQUE: A single spot fluoroscopic AP image of the right hip is submitted.  COMPARISON:  03/22/2014  FINDINGS: Two spot fluoroscopic views demonstrate placement of dynamic hip screw in the proximal femur. The distal aspect of the hardware is not included on these images. There is significant improvement in alignment of the fracture fragments of the intratrochanteric region of the right femur.  IMPRESSION: ORIF for intratrochanteric right femur fracture.   Electronically Signed   By: Britta Mccreedy M.D.   On: 04-13-14 22:05   Dg Chest Portable 1 View  April 13, 2014   CLINICAL DATA:  Preop, evaluate for infiltrates  EXAM: PORTABLE CHEST - 1 VIEW  COMPARISON:  Chest x-ray of 03/22/2014, and 2010-04-13  FINDINGS: The lungs remain hyperaerated consistent with emphysema. Coarse and prominent interstitial lung markings are stable bilaterally most consistent with chronic interstitial lung disease. Mild cardiomegaly is stable.  IMPRESSION: Stable emphysema and chronic interstitial change. No active process. Stable cardiomegaly.   Electronically Signed   By: Dwyane Dee M.D.   On: 04-13-2014 08:05    Microbiology: Recent Results (from the past 240 hour(s))  URINE CULTURE     Status: None   Collection Time    2014-04-13  4:03 AM      Result Value Ref Range Status   Specimen Description URINE, CATHETERIZED   Final   Special Requests NONE   Final   Culture  Setup Time     Final   Value: 04/13/14 08:27     Performed at [facility] Count     Final   Value: NO GROWTH     Performed at [facility]   Culture     Final   Value:  NO GROWTH     Performed at [facility]   Report Status 03/24/2014 FINAL   Final  SURGICAL  PCR SCREEN     Status: Abnormal   Collection Time    03/23/14  5:00 AM      Result Value Ref Range Status   MRSA, PCR NEGATIVE  NEGATIVE Final   Staphylococcus aureus POSITIVE (*) NEGATIVE Final   Comment:            The Xpert SA Assay (FDA     approved for NASAL specimens     in patients over 12 years of age),     is one component of     a comprehensive surveillance     program.  Test performance has     been validated by [employer] for patients greater     than or equal to 66 year old.     It is not intended     to diagnose infection nor to     guide or monitor treatment.     Labs: Basic Metabolic Panel:  Recent Labs Lab 03/23/14 0509 03/24/14 0544 03/25/14 0454 03/26/14 0513  NA 136* 134* 132* 133*  K 4.4 5.5* 4.1 4.4  CL 101 100 96 101  CO2 22 20 23 21   GLUCOSE 140* 162* 131* 136*  BUN 13 16 23 21   CREATININE 0.94 1.12* 1.04 0.78  CALCIUM 9.8 9.1 8.5 8.5   Liver Function Tests:  Recent Labs Lab 03/23/14 0509  AST 28  ALT 21  ALKPHOS 73  BILITOT 0.8  PROT 6.9  ALBUMIN 3.9   No results found for this basename: LIPASE, AMYLASE,  in the last 168 hours No results found for this basename: AMMONIA,  in the last 168 hours CBC:  Recent Labs Lab 03/23/14 0509 03/24/14 0544 03/25/14 0454 03/26/14 0513 03/27/14 0803  WBC 6.3 10.4 8.5 6.5 6.8  NEUTROABS 4.8  --   --   --   --   HGB 11.4* 10.4* 7.8* 7.6* 8.9*  HCT 33.9* 30.2* 21.8* 21.6* 25.9*  MCV 91.4 91.2 89.7 89.3 88.4  PLT 88* 121* 88* 83* 98*   Cardiac Enzymes:  Recent Labs Lab 03/23/14 0509  TROPONINI <0.30   BNP: BNP (last 3 results) No results found for this basename: PROBNP,  in the last 8760 hours CBG: No results found for this basename: GLUCAP,  in the last 168 hours     Signed:  Jeralyn Bennett  [facility] 03/27/2014, 9:17 AM

## 2014-03-27 NOTE — Progress Notes (Signed)
Pt assessment unchanged. Pt transported via [facility] to [facility] in [place], [place].  All paperwork sent with [facility].

## 2014-03-27 NOTE — Progress Notes (Signed)
CSW consult to pt regarding discharge to [facility]. CSW spoke with pt/family to inform of discharge and transport. Pt/family agreeable. CSW made arrangements to transport with [facility]. Pt appreciative of CSW assistance. No further CSW intervention needed.   Juliann Mule [phone]

## 2014-03-27 NOTE — Progress Notes (Signed)
Report called to Dione Housekeeper, RN at [facility] in [place].

## 2014-03-29 DIAGNOSIS — S72143A Displaced intertrochanteric fracture of unspecified femur, initial encounter for closed fracture: Secondary | ICD-10-CM | POA: Diagnosis not present

## 2014-03-29 DIAGNOSIS — D638 Anemia in other chronic diseases classified elsewhere: Secondary | ICD-10-CM | POA: Diagnosis not present

## 2014-03-29 DIAGNOSIS — I130 Hypertensive heart and chronic kidney disease with heart failure and stage 1 through stage 4 chronic kidney disease, or unspecified chronic kidney disease: Secondary | ICD-10-CM | POA: Diagnosis not present

## 2014-03-29 DIAGNOSIS — I509 Heart failure, unspecified: Secondary | ICD-10-CM | POA: Diagnosis not present

## 2014-03-29 DIAGNOSIS — G8918 Other acute postprocedural pain: Secondary | ICD-10-CM | POA: Diagnosis not present

## 2014-04-23 DIAGNOSIS — Z7901 Long term (current) use of anticoagulants: Secondary | ICD-10-CM | POA: Diagnosis not present

## 2014-04-23 DIAGNOSIS — S72009D Fracture of unspecified part of neck of unspecified femur, subsequent encounter for closed fracture with routine healing: Secondary | ICD-10-CM | POA: Diagnosis not present

## 2014-04-24 DIAGNOSIS — Z7901 Long term (current) use of anticoagulants: Secondary | ICD-10-CM | POA: Diagnosis not present

## 2014-04-24 DIAGNOSIS — S72009D Fracture of unspecified part of neck of unspecified femur, subsequent encounter for closed fracture with routine healing: Secondary | ICD-10-CM | POA: Diagnosis not present

## 2014-04-25 DIAGNOSIS — Z7901 Long term (current) use of anticoagulants: Secondary | ICD-10-CM | POA: Diagnosis not present

## 2014-04-25 DIAGNOSIS — S72009D Fracture of unspecified part of neck of unspecified femur, subsequent encounter for closed fracture with routine healing: Secondary | ICD-10-CM | POA: Diagnosis not present

## 2014-04-26 DIAGNOSIS — S72009D Fracture of unspecified part of neck of unspecified femur, subsequent encounter for closed fracture with routine healing: Secondary | ICD-10-CM | POA: Diagnosis not present

## 2014-04-26 DIAGNOSIS — Z7901 Long term (current) use of anticoagulants: Secondary | ICD-10-CM | POA: Diagnosis not present

## 2014-04-28 DIAGNOSIS — S72009D Fracture of unspecified part of neck of unspecified femur, subsequent encounter for closed fracture with routine healing: Secondary | ICD-10-CM | POA: Diagnosis not present

## 2014-04-28 DIAGNOSIS — Z7901 Long term (current) use of anticoagulants: Secondary | ICD-10-CM | POA: Diagnosis not present

## 2014-04-29 DIAGNOSIS — S72009D Fracture of unspecified part of neck of unspecified femur, subsequent encounter for closed fracture with routine healing: Secondary | ICD-10-CM | POA: Diagnosis not present

## 2014-04-30 DIAGNOSIS — S72009D Fracture of unspecified part of neck of unspecified femur, subsequent encounter for closed fracture with routine healing: Secondary | ICD-10-CM | POA: Diagnosis not present

## 2014-04-30 DIAGNOSIS — Z7901 Long term (current) use of anticoagulants: Secondary | ICD-10-CM | POA: Diagnosis not present

## 2014-05-03 DIAGNOSIS — Z7901 Long term (current) use of anticoagulants: Secondary | ICD-10-CM | POA: Diagnosis not present

## 2014-05-03 DIAGNOSIS — S72009D Fracture of unspecified part of neck of unspecified femur, subsequent encounter for closed fracture with routine healing: Secondary | ICD-10-CM | POA: Diagnosis not present

## 2014-05-04 DIAGNOSIS — S72009D Fracture of unspecified part of neck of unspecified femur, subsequent encounter for closed fracture with routine healing: Secondary | ICD-10-CM | POA: Diagnosis not present

## 2014-05-04 DIAGNOSIS — Z7901 Long term (current) use of anticoagulants: Secondary | ICD-10-CM | POA: Diagnosis not present

## 2014-05-05 DIAGNOSIS — Z7901 Long term (current) use of anticoagulants: Secondary | ICD-10-CM | POA: Diagnosis not present

## 2014-05-05 DIAGNOSIS — S72009D Fracture of unspecified part of neck of unspecified femur, subsequent encounter for closed fracture with routine healing: Secondary | ICD-10-CM | POA: Diagnosis not present

## 2014-05-06 DIAGNOSIS — S72143A Displaced intertrochanteric fracture of unspecified femur, initial encounter for closed fracture: Secondary | ICD-10-CM | POA: Diagnosis not present

## 2014-05-06 DIAGNOSIS — I10 Essential (primary) hypertension: Secondary | ICD-10-CM | POA: Diagnosis not present

## 2014-05-06 DIAGNOSIS — D62 Acute posthemorrhagic anemia: Secondary | ICD-10-CM | POA: Diagnosis not present

## 2014-05-06 DIAGNOSIS — Z6830 Body mass index (BMI) 30.0-30.9, adult: Secondary | ICD-10-CM | POA: Diagnosis not present

## 2014-05-06 DIAGNOSIS — N390 Urinary tract infection, site not specified: Secondary | ICD-10-CM | POA: Diagnosis not present

## 2014-05-07 DIAGNOSIS — S72009D Fracture of unspecified part of neck of unspecified femur, subsequent encounter for closed fracture with routine healing: Secondary | ICD-10-CM | POA: Diagnosis not present

## 2014-05-07 DIAGNOSIS — Z7901 Long term (current) use of anticoagulants: Secondary | ICD-10-CM | POA: Diagnosis not present

## 2014-05-10 DIAGNOSIS — Z7901 Long term (current) use of anticoagulants: Secondary | ICD-10-CM | POA: Diagnosis not present

## 2014-05-10 DIAGNOSIS — S72009D Fracture of unspecified part of neck of unspecified femur, subsequent encounter for closed fracture with routine healing: Secondary | ICD-10-CM | POA: Diagnosis not present

## 2014-05-13 DIAGNOSIS — S72009D Fracture of unspecified part of neck of unspecified femur, subsequent encounter for closed fracture with routine healing: Secondary | ICD-10-CM | POA: Diagnosis not present

## 2014-05-13 DIAGNOSIS — Z7901 Long term (current) use of anticoagulants: Secondary | ICD-10-CM | POA: Diagnosis not present

## 2014-05-18 DIAGNOSIS — S72009D Fracture of unspecified part of neck of unspecified femur, subsequent encounter for closed fracture with routine healing: Secondary | ICD-10-CM | POA: Diagnosis not present

## 2014-05-18 DIAGNOSIS — Z7901 Long term (current) use of anticoagulants: Secondary | ICD-10-CM | POA: Diagnosis not present

## 2014-05-19 DIAGNOSIS — Z7901 Long term (current) use of anticoagulants: Secondary | ICD-10-CM | POA: Diagnosis not present

## 2014-05-19 DIAGNOSIS — S72009D Fracture of unspecified part of neck of unspecified femur, subsequent encounter for closed fracture with routine healing: Secondary | ICD-10-CM | POA: Diagnosis not present

## 2014-05-20 DIAGNOSIS — Z7901 Long term (current) use of anticoagulants: Secondary | ICD-10-CM | POA: Diagnosis not present

## 2014-05-20 DIAGNOSIS — S72009D Fracture of unspecified part of neck of unspecified femur, subsequent encounter for closed fracture with routine healing: Secondary | ICD-10-CM | POA: Diagnosis not present

## 2014-06-01 DIAGNOSIS — N390 Urinary tract infection, site not specified: Secondary | ICD-10-CM | POA: Diagnosis not present

## 2014-06-01 DIAGNOSIS — R351 Nocturia: Secondary | ICD-10-CM | POA: Diagnosis not present

## 2014-06-03 DIAGNOSIS — D509 Iron deficiency anemia, unspecified: Secondary | ICD-10-CM | POA: Diagnosis not present

## 2014-06-03 DIAGNOSIS — Z6830 Body mass index (BMI) 30.0-30.9, adult: Secondary | ICD-10-CM | POA: Diagnosis not present

## 2014-06-03 DIAGNOSIS — I10 Essential (primary) hypertension: Secondary | ICD-10-CM | POA: Diagnosis not present

## 2014-06-03 DIAGNOSIS — E785 Hyperlipidemia, unspecified: Secondary | ICD-10-CM | POA: Diagnosis not present

## 2014-06-03 DIAGNOSIS — I6350 Cerebral infarction due to unspecified occlusion or stenosis of unspecified cerebral artery: Secondary | ICD-10-CM | POA: Diagnosis not present

## 2014-06-03 DIAGNOSIS — E039 Hypothyroidism, unspecified: Secondary | ICD-10-CM | POA: Diagnosis not present

## 2014-06-11 DIAGNOSIS — Z4789 Encounter for other orthopedic aftercare: Secondary | ICD-10-CM | POA: Diagnosis not present

## 2014-06-11 DIAGNOSIS — S72141D Displaced intertrochanteric fracture of right femur, subsequent encounter for closed fracture with routine healing: Secondary | ICD-10-CM | POA: Diagnosis not present

## 2014-06-29 DIAGNOSIS — K5900 Constipation, unspecified: Secondary | ICD-10-CM | POA: Diagnosis not present

## 2014-06-29 DIAGNOSIS — N3090 Cystitis, unspecified without hematuria: Secondary | ICD-10-CM | POA: Diagnosis not present

## 2014-06-29 DIAGNOSIS — N3080 Other cystitis without hematuria: Secondary | ICD-10-CM | POA: Diagnosis not present

## 2014-07-30 DIAGNOSIS — R339 Retention of urine, unspecified: Secondary | ICD-10-CM | POA: Diagnosis not present

## 2014-07-30 DIAGNOSIS — N3090 Cystitis, unspecified without hematuria: Secondary | ICD-10-CM | POA: Diagnosis not present

## 2014-08-19 ENCOUNTER — Encounter (HOSPITAL_COMMUNITY): Payer: Self-pay | Admitting: Vascular Surgery

## 2014-08-20 ENCOUNTER — Other Ambulatory Visit (HOSPITAL_COMMUNITY): Payer: Medicare Other

## 2014-08-20 ENCOUNTER — Ambulatory Visit: Payer: Medicare Other | Admitting: Vascular Surgery

## 2014-08-27 ENCOUNTER — Ambulatory Visit: Payer: Medicare Other | Admitting: Vascular Surgery

## 2014-08-27 ENCOUNTER — Other Ambulatory Visit (HOSPITAL_COMMUNITY): Payer: Medicare Other

## 2014-09-07 DIAGNOSIS — Z23 Encounter for immunization: Secondary | ICD-10-CM | POA: Diagnosis not present

## 2014-09-07 DIAGNOSIS — I10 Essential (primary) hypertension: Secondary | ICD-10-CM | POA: Diagnosis not present

## 2014-09-07 DIAGNOSIS — E785 Hyperlipidemia, unspecified: Secondary | ICD-10-CM | POA: Diagnosis not present

## 2014-09-07 DIAGNOSIS — E039 Hypothyroidism, unspecified: Secondary | ICD-10-CM | POA: Diagnosis not present

## 2014-09-07 DIAGNOSIS — E611 Iron deficiency: Secondary | ICD-10-CM | POA: Diagnosis not present

## 2014-09-07 DIAGNOSIS — M8580 Other specified disorders of bone density and structure, unspecified site: Secondary | ICD-10-CM | POA: Diagnosis not present

## 2014-09-07 DIAGNOSIS — I639 Cerebral infarction, unspecified: Secondary | ICD-10-CM | POA: Diagnosis not present

## 2014-09-07 DIAGNOSIS — Z6831 Body mass index (BMI) 31.0-31.9, adult: Secondary | ICD-10-CM | POA: Diagnosis not present

## 2014-09-13 DIAGNOSIS — N3090 Cystitis, unspecified without hematuria: Secondary | ICD-10-CM | POA: Diagnosis not present

## 2014-09-16 ENCOUNTER — Encounter: Payer: Self-pay | Admitting: Vascular Surgery

## 2014-09-17 ENCOUNTER — Other Ambulatory Visit: Payer: Self-pay | Admitting: Vascular Surgery

## 2014-09-17 ENCOUNTER — Ambulatory Visit (INDEPENDENT_AMBULATORY_CARE_PROVIDER_SITE_OTHER): Payer: Medicare Other | Admitting: Vascular Surgery

## 2014-09-17 ENCOUNTER — Encounter: Payer: Self-pay | Admitting: Vascular Surgery

## 2014-09-17 ENCOUNTER — Ambulatory Visit (HOSPITAL_COMMUNITY)
Admission: RE | Admit: 2014-09-17 | Discharge: 2014-09-17 | Disposition: A | Discharge status: Home / Self Care | Payer: Medicare Other | Source: Ambulatory Visit | Attending: Vascular Surgery | Admitting: Vascular Surgery

## 2014-09-17 VITALS — BP 180/91 | HR 61 | Ht 67.0 in | Wt 199.3 lb

## 2014-09-17 DIAGNOSIS — I724 Aneurysm of artery of lower extremity: Secondary | ICD-10-CM | POA: Diagnosis not present

## 2014-09-17 DIAGNOSIS — I728 Aneurysm of other specified arteries: Secondary | ICD-10-CM

## 2014-09-17 DIAGNOSIS — I722 Aneurysm of renal artery: Secondary | ICD-10-CM | POA: Diagnosis not present

## 2014-09-17 NOTE — Progress Notes (Signed)
Established Splenic Artery Embolization  History of Present Illness  [name] is a 81 y.o. (June 27, 1934) female who presents with chief complaint: follow up on SAA embolization (01/28/14).  This was complicated by delayed development of R femoral PSA requiring repair (01/31/14).  Pt denies any claudication or rest pain.  She healed the prior incision without any difficulties.  She had one day of severe LUQ pain which resolved.  She had her immunization prior to her embolization.  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  The patient's PMH, PSH, SH, FamHx, Med, and Allergies are unchanged from 02/19/14.  On ROS today: no leg sx, no abdominal pain sx  Physical Examination  Filed Vitals:   09/17/14 1043  BP: 180/91  Pulse: 61  Height: 5\' 7"  (1.702 m)  Weight: 199 lb 4.8 oz (90.402 kg)  SpO2: 98%   Body mass index is 31.21 kg/(m^2).  General: A&O x 3, WD, mildly obese  Pulmonary: Sym exp, good air movt, CTAB, no rales, rhonchi, & wheezing  Cardiac: RRR, Nl S1, S2, no Murmurs, rubs or gallops  Vascular: Vessel Right Left  Radial Palpable Palpable  Brachial Palpable Palpable  Carotid Palpable, without bruit Palpable, without bruit  Aorta Not palpable N/A  Femoral Palpable Palpable  Popliteal Not palpable Not palpable  PT Palpable Palpable  DP Palpable Palpable   Gastrointestinal: soft, NTND, -G/R, - HSM, - masses, - CVAT B  Musculoskeletal: M/S 5/5 throughout , Extremities without ischemic changes   Neurologic: Pain and light touch intact in extremities , Motor exam as listed above  Non-Invasive Vascular Imaging  Mesenteric Duplex (Date: 09/17/2014)  Aorta: 87 c/s, patent, no aneurysm  Celiac: 122 c/s, patent  SMA: 260 c/s, patent  Hepatic and splenic patent and antegrade  No comment on aneurysm   Medical Decision Making  [name] is a 81 y.o. female who presents with: s/p SAA  embolization, asx L renal artery aneurysm in anterior branch   Mesenteric duplex done today demonstrates the difficulty with follow-up on these embolization cases post-op.  Without expertise with such, getting a good evaluation of the residual sac is impossible.  Based on my intraoperative imaging, I suspect complete thrombosis of the sac hence the lack of mention on the report today.  I don't think the expense and radiation exposure of a repeat CTA is indicated in this case.  I would continue surveillance of the L renal artery aneurysm with L renal duplex, which will be scheduled in 6 months then annually if no significant change.  Thank you for allowing [place] to participate in this patient's care.  Leonides Sake, MD [facility] Office: [phone] Pager: [phone]  09/17/2014, 11:02 AM

## 2014-09-17 NOTE — Addendum Note (Signed)
Addended by: Mena Goes on: 09/17/2014 02:40 PM   Modules accepted: Orders

## 2014-09-29 DIAGNOSIS — N390 Urinary tract infection, site not specified: Secondary | ICD-10-CM | POA: Diagnosis not present

## 2014-09-29 DIAGNOSIS — N3090 Cystitis, unspecified without hematuria: Secondary | ICD-10-CM | POA: Diagnosis not present

## 2014-09-29 DIAGNOSIS — R339 Retention of urine, unspecified: Secondary | ICD-10-CM | POA: Diagnosis not present

## 2014-10-04 ENCOUNTER — Telehealth: Payer: Self-pay | Admitting: Vascular Surgery

## 2014-10-04 ENCOUNTER — Telehealth: Payer: Self-pay | Admitting: *Deleted

## 2014-10-04 NOTE — Telephone Encounter (Signed)
-----   Message from Gena Fray sent at 10/04/2014 10:00 AM EST ----- Regarding: Triage Ms Buehler wants to speak with a nurse. She thought she was released at her last visit and did not need to come back. She is scheduled to see [facility] in 03/2015. Before canceling, she wants to make sure it is not needed. She can be reached at her home phone #.  Thanks! Hinton Dyer

## 2014-10-04 NOTE — Telephone Encounter (Signed)
I explained to patient the reason we need her to come in is for surveillance of the stenosis in her renal artery as per [facility] note in January. She is agreeable to this plan and I verified her times to come in July.

## 2014-10-04 NOTE — Telephone Encounter (Signed)
-----   Message from Mena Goes, RN sent at 10/04/2014 10:40 AM EST ----- Regarding: RE: Triage I called her and she is OK with coming in July. I verified times with her. She is coming in for her renal stenosis.  ----- Message -----    From: Gena Fray    Sent: 10/04/2014  10:00 AM      To: [facility] Clinical Pool Subject: Triage                                         Ms Doug Sou wants to speak with a nurse. She thought she was released at her last visit and did not need to come back. She is scheduled to see [facility] in 03/2015. Before canceling, she wants to make sure it is not needed. She can be reached at her home phone #.  Thanks! Hinton Dyer

## 2014-10-06 DIAGNOSIS — N3090 Cystitis, unspecified without hematuria: Secondary | ICD-10-CM | POA: Diagnosis not present

## 2014-11-01 DIAGNOSIS — Z13820 Encounter for screening for osteoporosis: Secondary | ICD-10-CM | POA: Diagnosis not present

## 2014-11-01 DIAGNOSIS — M810 Age-related osteoporosis without current pathological fracture: Secondary | ICD-10-CM | POA: Diagnosis not present

## 2014-11-02 DIAGNOSIS — N3090 Cystitis, unspecified without hematuria: Secondary | ICD-10-CM | POA: Diagnosis not present

## 2014-11-02 DIAGNOSIS — R339 Retention of urine, unspecified: Secondary | ICD-10-CM | POA: Diagnosis not present

## 2014-12-14 DIAGNOSIS — E039 Hypothyroidism, unspecified: Secondary | ICD-10-CM | POA: Diagnosis not present

## 2014-12-14 DIAGNOSIS — E611 Iron deficiency: Secondary | ICD-10-CM | POA: Diagnosis not present

## 2014-12-14 DIAGNOSIS — E785 Hyperlipidemia, unspecified: Secondary | ICD-10-CM | POA: Diagnosis not present

## 2014-12-14 DIAGNOSIS — Z6830 Body mass index (BMI) 30.0-30.9, adult: Secondary | ICD-10-CM | POA: Diagnosis not present

## 2014-12-14 DIAGNOSIS — I639 Cerebral infarction, unspecified: Secondary | ICD-10-CM | POA: Diagnosis not present

## 2014-12-14 DIAGNOSIS — M810 Age-related osteoporosis without current pathological fracture: Secondary | ICD-10-CM | POA: Diagnosis not present

## 2014-12-14 DIAGNOSIS — D509 Iron deficiency anemia, unspecified: Secondary | ICD-10-CM | POA: Diagnosis not present

## 2014-12-14 DIAGNOSIS — I10 Essential (primary) hypertension: Secondary | ICD-10-CM | POA: Diagnosis not present

## 2014-12-17 DIAGNOSIS — I10 Essential (primary) hypertension: Secondary | ICD-10-CM | POA: Diagnosis not present

## 2014-12-17 DIAGNOSIS — S81819A Laceration without foreign body, unspecified lower leg, initial encounter: Secondary | ICD-10-CM | POA: Diagnosis not present

## 2014-12-17 DIAGNOSIS — E780 Pure hypercholesterolemia: Secondary | ICD-10-CM | POA: Diagnosis not present

## 2014-12-17 DIAGNOSIS — R030 Elevated blood-pressure reading, without diagnosis of hypertension: Secondary | ICD-10-CM | POA: Diagnosis not present

## 2014-12-17 DIAGNOSIS — Z8673 Personal history of transient ischemic attack (TIA), and cerebral infarction without residual deficits: Secondary | ICD-10-CM | POA: Diagnosis not present

## 2014-12-17 DIAGNOSIS — S81811A Laceration without foreign body, right lower leg, initial encounter: Secondary | ICD-10-CM | POA: Diagnosis not present

## 2015-01-03 DIAGNOSIS — N390 Urinary tract infection, site not specified: Secondary | ICD-10-CM | POA: Diagnosis not present

## 2015-02-01 DIAGNOSIS — R339 Retention of urine, unspecified: Secondary | ICD-10-CM | POA: Diagnosis not present

## 2015-02-01 DIAGNOSIS — N3080 Other cystitis without hematuria: Secondary | ICD-10-CM | POA: Diagnosis not present

## 2015-02-23 DIAGNOSIS — Z1211 Encounter for screening for malignant neoplasm of colon: Secondary | ICD-10-CM | POA: Diagnosis not present

## 2015-02-23 DIAGNOSIS — K5730 Diverticulosis of large intestine without perforation or abscess without bleeding: Secondary | ICD-10-CM | POA: Diagnosis not present

## 2015-03-07 DIAGNOSIS — N3080 Other cystitis without hematuria: Secondary | ICD-10-CM | POA: Diagnosis not present

## 2015-03-15 ENCOUNTER — Encounter: Payer: Self-pay | Admitting: Vascular Surgery

## 2015-03-18 ENCOUNTER — Ambulatory Visit (HOSPITAL_COMMUNITY)
Admission: RE | Admit: 2015-03-18 | Discharge: 2015-03-18 | Disposition: A | Discharge status: Home / Self Care | Payer: Medicare Other | Source: Ambulatory Visit | Attending: Family | Admitting: Family

## 2015-03-18 ENCOUNTER — Other Ambulatory Visit (HOSPITAL_COMMUNITY): Payer: Medicare Other

## 2015-03-18 ENCOUNTER — Encounter: Payer: Self-pay | Admitting: Family

## 2015-03-18 ENCOUNTER — Other Ambulatory Visit: Payer: Self-pay | Admitting: Vascular Surgery

## 2015-03-18 ENCOUNTER — Ambulatory Visit: Payer: Medicare Other | Admitting: Vascular Surgery

## 2015-03-18 ENCOUNTER — Ambulatory Visit (INDEPENDENT_AMBULATORY_CARE_PROVIDER_SITE_OTHER): Payer: Medicare Other | Admitting: Family

## 2015-03-18 VITALS — BP 166/95 | HR 61 | Resp 16 | Ht 67.0 in | Wt 198.0 lb

## 2015-03-18 DIAGNOSIS — I722 Aneurysm of renal artery: Secondary | ICD-10-CM

## 2015-03-18 DIAGNOSIS — I10 Essential (primary) hypertension: Secondary | ICD-10-CM

## 2015-03-18 DIAGNOSIS — I728 Aneurysm of other specified arteries: Secondary | ICD-10-CM

## 2015-03-18 NOTE — Progress Notes (Signed)
Established Renal Artery Aneurysm  History of Present Illness  [name] is a 81 y.o. ([birth year]) female patient of Dr. Imogene Burn who presents with chief complaint:  follow up on SAA embolization (01/28/14). This was complicated by delayed development of R femoral PSA requiring repair (01/31/14). Pt denies any claudication or rest pain. She healed the prior incision without any difficulties. She had one day of severe LUQ pain which resolved. She had her immunization prior to her embolization. 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  She lives in [place]. She does not know what her blood pressure usually runs, but states most of the time her blood pressure is good in her PCP's office in [place]. She states she is scheduled to see her PCP next week. Pt states that her PCP told her that she has a mild heart murmur.   She has a colonoscopy scheduled for next week.     Past Medical History  Diagnosis Date  . Hypertension   . High cholesterol   . Stroke 1990's    "mild", denies residual on 01/28/2014  . Pneumonia     "maybe in my teens"  . Hypothyroidism   . Anemia   . History of blood transfusion     "S/P knee OR"  . Arthritis     "was in my knees"  . Recurrent UTI (urinary tract infection)     Social History History  Substance Use Topics  . Smoking status: Never Smoker   . Smokeless tobacco: Never Used  . Alcohol Use: No    Family History Family History  Problem Relation Age of Onset  . Hypertension Son     Surgical History Past Surgical History  Procedure Laterality Date  . Muscle biopsy Left 1990's    "knot biopsy from carrying mail bag for years"  . Bladder suspension  2012  . Splenic artery embolization  01/28/2014  . Joint replacement    . Total knee arthroplasty Bilateral 2012  . Vaginal hysterectomy    . Revison of arteriovenous fistula Right 01/31/2014    Procedure: REPAIR PSEUDOANEURYSM;   Surgeon: Nada Libman, MD;  Location: [facility] OR;  Service: Vascular;  Laterality: Right;  Repair of Femoral Pseudoaneurysm.  . Femur im nail Right 03/23/2014    Procedure: INTRAMEDULLARY (IM) NAIL FEMORAL;  Surgeon: Shelda Pal, MD;  Location: [facility] ORS;  Service: Orthopedics;  Laterality: Right;  . Visceral angiogram N/A 01/28/2014    Procedure: VISCERAL ANGIOGRAM;  Surgeon: Fransisco Hertz, MD;  Location: [facility] CATH LAB;  Service: Cardiovascular;  Laterality: N/A;  . Embolization N/A 01/28/2014    Procedure: EMBOLIZATION;  Surgeon: Fransisco Hertz, MD;  Location: [facility] CATH LAB;  Service: Cardiovascular;  Laterality: N/A;  Splenic Artery    Allergies  Allergen Reactions  . Aspirin Other (See Comments)    Causes blood in urine    Current Outpatient Prescriptions  Medication Sig Dispense Refill  . bethanechol (URECHOLINE) 50 MG tablet Take 50 mg by mouth 2 (two) times daily.    . Calcium Carbonate-Vitamin D (CALCIUM 500 + D PO) Take 500 mg by mouth 2 (two) times daily.    . cephALEXin (KEFLEX) 500 MG capsule     . cholecalciferol (VITAMIN D) 1000 UNITS tablet Take 2,000 Units by mouth daily.    . clopidogrel (PLAVIX) 75 MG tablet Take 75 mg by mouth at bedtime.     . Cranberry-Vitamin C-Vitamin E (  CRANBERRY PLUS VITAMIN C) 140-100-3 MG-MG-UNIT CAPS Take 2,000 Units by mouth 2 (two) times daily.    Marland Kitchen docusate sodium 100 MG CAPS Take 100 mg by mouth 2 (two) times daily. 10 capsule 0  . ezetimibe (ZETIA) 10 MG tablet Take 10 mg by mouth at bedtime.     . feeding supplement, ENSURE COMPLETE, (ENSURE COMPLETE) LIQD Take 237 mLs by mouth daily. 20 Bottle 0  . ferrous sulfate 325 (65 FE) MG tablet Take 1 tablet (325 mg total) by mouth 3 (three) times daily after meals.  3  . folic acid (FOLVITE) 400 MCG tablet Take 400 mcg by mouth daily.    Marland Kitchen HYDROcodone-acetaminophen (NORCO/VICODIN) 5-325 MG per tablet Take 1-2 tablets by mouth every 4 (four) hours as needed for moderate pain. 100 tablet 0  . labetalol  (NORMODYNE) 200 MG tablet Take 300 mg by mouth 2 (two) times daily. Take 1 1/2 tablets twice daily    . levothyroxine (SYNTHROID, LEVOTHROID) 50 MCG tablet Take 50 mcg by mouth daily before breakfast.    . Omega-3 Fatty Acids (FISH OIL) 1000 MG CAPS Take 1,000 mg by mouth 2 (two) times daily.    . polyethylene glycol (MIRALAX / GLYCOLAX) packet Take 17 g by mouth daily as needed for mild constipation. 14 each 0  . sulfamethoxazole-trimethoprim (BACTRIM DS,SEPTRA DS) 800-160 MG per tablet     . tamsulosin (FLOMAX) 0.4 MG CAPS capsule Take 0.4 mg by mouth 2 (two) times daily.    Marland Kitchen trimethoprim (TRIMPEX) 100 MG tablet      No current facility-administered medications for this visit.    REVIEW OF SYSTEMS: see HPI for pertinent positives and negatives    Physical Examination  Filed Vitals:   03/18/15 0951 03/18/15 0953  BP: 183/96 166/95  Pulse: 64 61  Resp: 16   Height: 5\' 7"  (1.702 m)   Weight: 198 lb (89.812 kg)    Body mass index is 31 kg/(m^2).   General: A&O x 3, WD, mildly obese  Pulmonary: Sym exp, good air movt, CTAB, no rales, rhonchi, & wheezing  Cardiac: RRR, Nl S1, S2, + murmur  Vascular: Vessel Right Left  Radial Palpable Palpable  Carotid Palpable, without bruit Palpable, without bruit  Aorta Not palpable N/A  Femoral 3+Palpable 1+Palpable  Popliteal Not palpable Not palpable  PT Not Palpable Not Palpable  DP 2+Palpable 2+Palpable   Gastrointestinal: soft, NTND, -G/R, - HSM, - palpable masses, - CVAT B  Musculoskeletal: M/S 5/5 throughout, extremities without ischemic changes   Neurologic: Pain and light touch intact in extremities , Motor exam as listed above          Non-Invasive Vascular Imaging  Renal Duplex (Date: 03/18/2015)  RENAL ARTERY DUPLEX EVALUATION    INDICATION: Renal artery aneurysm    PREVIOUS INTERVENTION(S): Splenic artery embolization on 01/28/14; Right femoral pseudoaneurysm repair on 01/31/14      DUPLEX EXAM:     AORTA Peak Systolic Velocity (cm/s): 70    RIGHT  LEFT   Peak Systolic Velocities (cm/s) Comments  Peak Systolic Velocities (cm/s) Comments    Renal Artery Origin/Proximal 72     Renal Artery Mid 82     Renal Artery Distal 82     Accessory Renal Artery (when present)      Renal / Aortic Ratio (RAR) 1.2   Kidney Size (cm) 10.8    Renal Vein Patent    ADDITIONAL FINDINGS: . Difficulty in obtaining the left renal artery diameter measurements due to  limited visualization of the vessel walls. A maximum obtained diameter measurement of 1.4cm was noted in the splenic artery near the hilum however the same visual limitations described above were a factor. . The left kidney length measurement is within normal limits.    IMPRESSION: 1. Doppler velocities suggest no hemodynamically significant stenosis of the left renal artery. 2. Maximum diameter measurement at the left distal renal artery is 1.1cm, based on limited visualization, as described above.    Compared to the previous exam:  The previous CT angiogram report on 12/31/13 states "a partially thrombosed peripherally calcified 9mm aneurysm from the anterior division branch, a short segment distal to its origin" regarding the left renal artery and "2.6 x 3.2cm aneurysm near the splenic hilum" regarding the splenic artery.     Medical Decision Making  [name] is a 81 y.o. female who is s/p  SAA embolization (01/28/14). This was complicated by delayed development of R femoral PSA requiring repair (01/31/14). Pt denies any claudication or rest pain. She healed the prior incision without any difficulties.  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 Today's left renal artery Duplex suggests maximum diameter measurement at the left distal renal artery is 1.1cm.  Pt advised to follow up with her PCP re her uncontrolled hypertension. She was advised to take her  blood pressure medication ASAP as she normally takes it at 1100 AM or 1200 and again at night. She has an appointment with her PCP next week.  Dr. Nicky Pugh assessment from 09/17/14:  Mesenteric duplex done today demonstrates the difficulty with follow-up on these embolization cases post-op. Without expertise with such, getting a good evaluation of the residual sac is impossible. Based on my intraoperative imaging, I suspect complete thrombosis of the sac hence the lack of mention on the report today. I don't think the expense and radiation exposure of a repeat CTA is indicated in this case.  I would continue surveillance of the L renal artery aneurysm with L renal duplex, which will be scheduled in 6 months then annually if no significant change.  Face to face time with patient was 25 minutes. Over 50% of this time was spent on counseling and coordination of care.    Based on the patient's vascular studies and examination, and after discussing with Dr. Imogene Burn, I have offered the patient: left renal artery Duplex in 6 months and follow up with me.  I discussed in depth with the patient the nature of atherosclerosis, and emphasized the importance of maximal medical management including strict control of blood pressure, blood glucose, and lipid levels, antiplatelet agents, obtaining regular exercise, and cessation of smoking.  The patient is aware that without maximal medical management the underlying atherosclerotic disease process will progress, limiting the benefit of any interventions.  Thank you for allowing [place] to participate in this patient's care.  NICKEL, Carma Lair, RN, MSN, FNP-C [facility] Office: [phone]  03/18/2015, 10:03 AM  Clinic MD:  Imogene Burn

## 2015-03-18 NOTE — Addendum Note (Signed)
Addended by: Dorthula Rue L on: 03/18/2015 11:11 AM   Modules accepted: Orders

## 2015-03-18 NOTE — Patient Instructions (Signed)
Hypertension °Hypertension, commonly called high blood pressure, is when the force of blood pumping through your arteries is too strong. Your arteries are the blood vessels that carry blood from your heart throughout your body. A blood pressure reading consists of a higher number over a lower number, such as 110/72. The higher number (systolic) is the pressure inside your arteries when your heart pumps. The lower number (diastolic) is the pressure inside your arteries when your heart relaxes. Ideally you want your blood pressure below 120/80. °Hypertension forces your heart to work harder to pump blood. Your arteries may become narrow or stiff. Having hypertension puts you at risk for heart disease, stroke, and other problems.  °RISK FACTORS °Some risk factors for high blood pressure are controllable. Others are not.  °Risk factors you cannot control include:  °· Race. You may be at higher risk if you are African American. °· Age. Risk increases with age. °· Gender. Men are at higher risk than women before age 45 years. After age 65, women are at higher risk than men. °Risk factors you can control include: °· Not getting enough exercise or physical activity. °· Being overweight. °· Getting too much fat, sugar, calories, or salt in your diet. °· Drinking too much alcohol. °SIGNS AND SYMPTOMS °Hypertension does not usually cause signs or symptoms. Extremely high blood pressure (hypertensive crisis) may cause headache, anxiety, shortness of breath, and nosebleed. °DIAGNOSIS  °To check if you have hypertension, your health care provider will measure your blood pressure while you are seated, with your arm held at the level of your heart. It should be measured at least twice using the same arm. Certain conditions can cause a difference in blood pressure between your right and left arms. A blood pressure reading that is higher than normal on one occasion does not mean that you need treatment. If one blood pressure reading  is high, ask your health care provider about having it checked again. °TREATMENT  °Treating high blood pressure includes making lifestyle changes and possibly taking medicine. Living a healthy lifestyle can help lower high blood pressure. You may need to change some of your habits. °Lifestyle changes may include: °· Following the DASH diet. This diet is high in fruits, vegetables, and whole grains. It is low in salt, red meat, and added sugars. °· Getting at least 2½ hours of brisk physical activity every week. °· Losing weight if necessary. °· Not smoking. °· Limiting alcoholic beverages. °· Learning ways to reduce stress. ° If lifestyle changes are not enough to get your blood pressure under control, your health care provider may prescribe medicine. You may need to take more than one. Work closely with your health care provider to understand the risks and benefits. °HOME CARE INSTRUCTIONS °· Have your blood pressure rechecked as directed by your health care provider.   °· Take medicines only as directed by your health care provider. Follow the directions carefully. Blood pressure medicines must be taken as prescribed. The medicine does not work as well when you skip doses. Skipping doses also puts you at risk for problems.   °· Do not smoke.   °· Monitor your blood pressure at home as directed by your health care provider.  °SEEK MEDICAL CARE IF:  °· You think you are having a reaction to medicines taken. °· You have recurrent headaches or feel dizzy. °· You have swelling in your ankles. °· You have trouble with your vision. °SEEK IMMEDIATE MEDICAL CARE IF: °· You develop a severe headache or confusion. °·   You have unusual weakness, numbness, or feel faint. °· You have severe chest or abdominal pain. °· You vomit repeatedly. °· You have trouble breathing. °MAKE SURE YOU:  °· Understand these instructions. °· Will watch your condition. °· Will get help right away if you are not doing well or get worse. °Document  Released: 08/27/2005 Document Revised: 01/11/2014 Document Reviewed: 06/19/2013 °ExitCare® Patient Information ©2015 [employer]. This information is not intended to replace advice given to you by your health care provider. Make sure you discuss any questions you have with your health care provider. ° °Managing Your High Blood Pressure °Blood pressure is a measurement of how forceful your blood is pressing against the walls of the arteries. Arteries are muscular tubes within the circulatory system. Blood pressure does not stay the same. Blood pressure rises when you are active, excited, or nervous; and it lowers during sleep and relaxation. If the numbers measuring your blood pressure stay above normal most of the time, you are at risk for health problems. High blood pressure (hypertension) is a long-term (chronic) condition in which blood pressure is elevated. °A blood pressure reading is recorded as two numbers, such as 120 over 80 (or 120/80). The first, higher number is called the systolic pressure. It is a measure of the pressure in your arteries as the heart beats. The second, lower number is called the diastolic pressure. It is a measure of the pressure in your arteries as the heart relaxes between beats.  °Keeping your blood pressure in a normal range is important to your overall health and prevention of health problems, such as heart disease and stroke. When your blood pressure is uncontrolled, your heart has to work harder than normal. High blood pressure is a very common condition in adults because blood pressure tends to rise with age. Men and women are equally likely to have hypertension but at different times in life. Before age 45, men are more likely to have hypertension. After 79 years of age, women are more likely to have it. Hypertension is especially common in African Americans. This condition often has no signs or symptoms. The cause of the condition is usually not known. Your caregiver can  help you come up with a plan to keep your blood pressure in a normal, healthy range. °BLOOD PRESSURE STAGES °Blood pressure is classified into four stages: normal, prehypertension, stage 1, and stage 2. Your blood pressure reading will be used to determine what type of treatment, if any, is necessary. Appropriate treatment options are tied to these four stages:  °Normal °· Systolic pressure (mm Hg): below 120. °· Diastolic pressure (mm Hg): below 80. °Prehypertension °· Systolic pressure (mm Hg): 120 to 139. °· Diastolic pressure (mm Hg): 80 to 89. °Stage 1 °· Systolic pressure (mm Hg): 140 to 159. °· Diastolic pressure (mm Hg): 90 to 99. °Stage 2 °· Systolic pressure (mm Hg): 160 or above. °· Diastolic pressure (mm Hg): 100 or above. °RISKS RELATED TO HIGH BLOOD PRESSURE °Managing your blood pressure is an important responsibility. Uncontrolled high blood pressure can lead to: °· A heart attack. °· A stroke. °· A weakened blood vessel (aneurysm). °· Heart failure. °· Kidney damage. °· Eye damage. °· Metabolic syndrome. °· Memory and concentration problems. °HOW TO MANAGE YOUR BLOOD PRESSURE °Blood pressure can be managed effectively with lifestyle changes and medicines (if needed). Your caregiver will help you come up with a plan to bring your blood pressure within a normal range. Your plan should include the following: °Education °·   Read all information provided by your caregivers about how to control blood pressure. °· Educate yourself on the latest guidelines and treatment recommendations. New research is always being done to further define the risks and treatments for high blood pressure. °Lifestyle changes °· Control your weight. °· Avoid smoking. °· Stay physically active. °· Reduce the amount of salt in your diet. °· Reduce stress. °· Control any chronic conditions, such as high cholesterol or diabetes. °· Reduce your alcohol intake. °Medicines °· Several medicines (antihypertensive medicines) are available,  if needed, to bring blood pressure within a normal range. °Communication °· Review all the medicines you take with your caregiver because there may be side effects or interactions. °· Talk with your caregiver about your diet, exercise habits, and other lifestyle factors that may be contributing to high blood pressure. °· See your caregiver regularly. Your caregiver can help you create and adjust your plan for managing high blood pressure. °RECOMMENDATIONS FOR TREATMENT AND FOLLOW-UP  °The following recommendations are based on current guidelines for managing high blood pressure in nonpregnant adults. Use these recommendations to identify the proper follow-up period or treatment option based on your blood pressure reading. You can discuss these options with your caregiver. °· Systolic pressure of 120 to 139 or diastolic pressure of 80 to 89: Follow up with your caregiver as directed. °· Systolic pressure of 140 to 160 or diastolic pressure of 90 to 100: Follow up with your caregiver within 2 months. °· Systolic pressure above 160 or diastolic pressure above 100: Follow up with your caregiver within 1 month. °· Systolic pressure above 180 or diastolic pressure above 110: Consider antihypertensive therapy; follow up with your caregiver within 1 week. °· Systolic pressure above 200 or diastolic pressure above 120: Begin antihypertensive therapy; follow up with your caregiver within 1 week. °Document Released: 05/21/2012 Document Reviewed: 05/21/2012 °ExitCare® Patient Information ©2015 [employer]. This information is not intended to replace advice given to you by your health care provider. Make sure you discuss any questions you have with your health care provider. ° °

## 2015-03-18 NOTE — Progress Notes (Signed)
Filed Vitals:   03/18/15 0951 03/18/15 0953  BP: 183/96 166/95  Pulse: 64 61  Resp: 16   Height: 5\' 7"  (1.702 m)   Weight: 198 lb (89.812 kg)

## 2015-03-24 DIAGNOSIS — Z6831 Body mass index (BMI) 31.0-31.9, adult: Secondary | ICD-10-CM | POA: Diagnosis not present

## 2015-03-24 DIAGNOSIS — Z1231 Encounter for screening mammogram for malignant neoplasm of breast: Secondary | ICD-10-CM | POA: Diagnosis not present

## 2015-03-24 DIAGNOSIS — E785 Hyperlipidemia, unspecified: Secondary | ICD-10-CM | POA: Diagnosis not present

## 2015-03-24 DIAGNOSIS — I639 Cerebral infarction, unspecified: Secondary | ICD-10-CM | POA: Diagnosis not present

## 2015-03-24 DIAGNOSIS — E611 Iron deficiency: Secondary | ICD-10-CM | POA: Diagnosis not present

## 2015-03-24 DIAGNOSIS — I10 Essential (primary) hypertension: Secondary | ICD-10-CM | POA: Diagnosis not present

## 2015-03-24 DIAGNOSIS — Z1389 Encounter for screening for other disorder: Secondary | ICD-10-CM | POA: Diagnosis not present

## 2015-03-24 DIAGNOSIS — M810 Age-related osteoporosis without current pathological fracture: Secondary | ICD-10-CM | POA: Diagnosis not present

## 2015-03-24 DIAGNOSIS — Z23 Encounter for immunization: Secondary | ICD-10-CM | POA: Diagnosis not present

## 2015-03-24 DIAGNOSIS — E039 Hypothyroidism, unspecified: Secondary | ICD-10-CM | POA: Diagnosis not present

## 2015-03-25 ENCOUNTER — Other Ambulatory Visit: Payer: Self-pay

## 2015-03-25 DIAGNOSIS — Z8673 Personal history of transient ischemic attack (TIA), and cerebral infarction without residual deficits: Secondary | ICD-10-CM | POA: Diagnosis not present

## 2015-03-25 DIAGNOSIS — D126 Benign neoplasm of colon, unspecified: Secondary | ICD-10-CM | POA: Diagnosis not present

## 2015-03-25 DIAGNOSIS — Z1211 Encounter for screening for malignant neoplasm of colon: Secondary | ICD-10-CM | POA: Diagnosis not present

## 2015-03-25 DIAGNOSIS — I10 Essential (primary) hypertension: Secondary | ICD-10-CM | POA: Diagnosis not present

## 2015-03-25 DIAGNOSIS — D124 Benign neoplasm of descending colon: Secondary | ICD-10-CM | POA: Diagnosis not present

## 2015-03-25 DIAGNOSIS — K5730 Diverticulosis of large intestine without perforation or abscess without bleeding: Secondary | ICD-10-CM | POA: Diagnosis not present

## 2015-03-25 DIAGNOSIS — E039 Hypothyroidism, unspecified: Secondary | ICD-10-CM | POA: Diagnosis not present

## 2015-03-29 DIAGNOSIS — Z4789 Encounter for other orthopedic aftercare: Secondary | ICD-10-CM | POA: Diagnosis not present

## 2015-03-29 DIAGNOSIS — S72141D Displaced intertrochanteric fracture of right femur, subsequent encounter for closed fracture with routine healing: Secondary | ICD-10-CM | POA: Diagnosis not present

## 2015-04-07 DIAGNOSIS — N3090 Cystitis, unspecified without hematuria: Secondary | ICD-10-CM | POA: Diagnosis not present

## 2015-04-11 DIAGNOSIS — N6489 Other specified disorders of breast: Secondary | ICD-10-CM | POA: Diagnosis not present

## 2015-04-11 DIAGNOSIS — R922 Inconclusive mammogram: Secondary | ICD-10-CM | POA: Diagnosis not present

## 2015-04-11 DIAGNOSIS — Z1231 Encounter for screening mammogram for malignant neoplasm of breast: Secondary | ICD-10-CM | POA: Diagnosis not present

## 2015-04-14 DIAGNOSIS — H2513 Age-related nuclear cataract, bilateral: Secondary | ICD-10-CM | POA: Diagnosis not present

## 2015-04-20 DIAGNOSIS — R922 Inconclusive mammogram: Secondary | ICD-10-CM | POA: Diagnosis not present

## 2015-04-20 DIAGNOSIS — R928 Other abnormal and inconclusive findings on diagnostic imaging of breast: Secondary | ICD-10-CM | POA: Diagnosis not present

## 2015-04-21 DIAGNOSIS — I10 Essential (primary) hypertension: Secondary | ICD-10-CM | POA: Diagnosis not present

## 2015-04-21 DIAGNOSIS — Z9181 History of falling: Secondary | ICD-10-CM | POA: Diagnosis not present

## 2015-04-21 DIAGNOSIS — Z6831 Body mass index (BMI) 31.0-31.9, adult: Secondary | ICD-10-CM | POA: Diagnosis not present

## 2015-05-04 DIAGNOSIS — N3090 Cystitis, unspecified without hematuria: Secondary | ICD-10-CM | POA: Diagnosis not present

## 2015-05-04 DIAGNOSIS — R339 Retention of urine, unspecified: Secondary | ICD-10-CM | POA: Diagnosis not present

## 2015-06-16 DIAGNOSIS — N3091 Cystitis, unspecified with hematuria: Secondary | ICD-10-CM | POA: Diagnosis not present

## 2015-06-16 DIAGNOSIS — N390 Urinary tract infection, site not specified: Secondary | ICD-10-CM | POA: Diagnosis not present

## 2015-06-21 DIAGNOSIS — S76912A Strain of unspecified muscles, fascia and tendons at thigh level, left thigh, initial encounter: Secondary | ICD-10-CM | POA: Diagnosis not present

## 2015-06-21 DIAGNOSIS — M1610 Unilateral primary osteoarthritis, unspecified hip: Secondary | ICD-10-CM | POA: Diagnosis not present

## 2015-06-21 DIAGNOSIS — S76012A Strain of muscle, fascia and tendon of left hip, initial encounter: Secondary | ICD-10-CM | POA: Diagnosis not present

## 2015-06-21 DIAGNOSIS — M1990 Unspecified osteoarthritis, unspecified site: Secondary | ICD-10-CM | POA: Diagnosis not present

## 2015-06-21 DIAGNOSIS — M79652 Pain in left thigh: Secondary | ICD-10-CM | POA: Diagnosis not present

## 2015-06-21 DIAGNOSIS — M25552 Pain in left hip: Secondary | ICD-10-CM | POA: Diagnosis not present

## 2015-07-18 DIAGNOSIS — N390 Urinary tract infection, site not specified: Secondary | ICD-10-CM | POA: Diagnosis not present

## 2015-07-18 DIAGNOSIS — L820 Inflamed seborrheic keratosis: Secondary | ICD-10-CM | POA: Diagnosis not present

## 2015-07-18 DIAGNOSIS — N3090 Cystitis, unspecified without hematuria: Secondary | ICD-10-CM | POA: Diagnosis not present

## 2015-07-28 DIAGNOSIS — E039 Hypothyroidism, unspecified: Secondary | ICD-10-CM | POA: Diagnosis not present

## 2015-07-28 DIAGNOSIS — Z6831 Body mass index (BMI) 31.0-31.9, adult: Secondary | ICD-10-CM | POA: Diagnosis not present

## 2015-07-28 DIAGNOSIS — I639 Cerebral infarction, unspecified: Secondary | ICD-10-CM | POA: Diagnosis not present

## 2015-07-28 DIAGNOSIS — Z23 Encounter for immunization: Secondary | ICD-10-CM | POA: Diagnosis not present

## 2015-07-28 DIAGNOSIS — R7301 Impaired fasting glucose: Secondary | ICD-10-CM | POA: Diagnosis not present

## 2015-07-28 DIAGNOSIS — M810 Age-related osteoporosis without current pathological fracture: Secondary | ICD-10-CM | POA: Diagnosis not present

## 2015-07-28 DIAGNOSIS — D509 Iron deficiency anemia, unspecified: Secondary | ICD-10-CM | POA: Diagnosis not present

## 2015-07-28 DIAGNOSIS — E785 Hyperlipidemia, unspecified: Secondary | ICD-10-CM | POA: Diagnosis not present

## 2015-07-28 DIAGNOSIS — E669 Obesity, unspecified: Secondary | ICD-10-CM | POA: Diagnosis not present

## 2015-07-28 DIAGNOSIS — I10 Essential (primary) hypertension: Secondary | ICD-10-CM | POA: Diagnosis not present

## 2015-07-28 DIAGNOSIS — E559 Vitamin D deficiency, unspecified: Secondary | ICD-10-CM | POA: Diagnosis not present

## 2015-08-03 DIAGNOSIS — N390 Urinary tract infection, site not specified: Secondary | ICD-10-CM | POA: Diagnosis not present

## 2015-08-03 DIAGNOSIS — R339 Retention of urine, unspecified: Secondary | ICD-10-CM | POA: Diagnosis not present

## 2015-08-03 DIAGNOSIS — N3090 Cystitis, unspecified without hematuria: Secondary | ICD-10-CM | POA: Diagnosis not present

## 2015-08-19 DIAGNOSIS — M5417 Radiculopathy, lumbosacral region: Secondary | ICD-10-CM | POA: Diagnosis not present

## 2015-08-19 DIAGNOSIS — M79605 Pain in left leg: Secondary | ICD-10-CM | POA: Diagnosis not present

## 2015-09-09 DIAGNOSIS — Z6831 Body mass index (BMI) 31.0-31.9, adult: Secondary | ICD-10-CM | POA: Diagnosis not present

## 2015-09-09 DIAGNOSIS — J208 Acute bronchitis due to other specified organisms: Secondary | ICD-10-CM | POA: Diagnosis not present

## 2015-09-09 DIAGNOSIS — E669 Obesity, unspecified: Secondary | ICD-10-CM | POA: Diagnosis not present

## 2015-09-23 ENCOUNTER — Encounter: Payer: Self-pay | Admitting: Family

## 2015-09-30 ENCOUNTER — Ambulatory Visit (INDEPENDENT_AMBULATORY_CARE_PROVIDER_SITE_OTHER): Payer: Medicare Other | Admitting: Family

## 2015-09-30 ENCOUNTER — Encounter: Payer: Self-pay | Admitting: Family

## 2015-09-30 ENCOUNTER — Ambulatory Visit (HOSPITAL_COMMUNITY)
Admission: RE | Admit: 2015-09-30 | Discharge: 2015-09-30 | Disposition: A | Discharge status: Home / Self Care | Payer: Medicare Other | Source: Ambulatory Visit | Attending: Family | Admitting: Family

## 2015-09-30 VITALS — BP 154/91 | HR 64 | Temp 97.2°F | Resp 16 | Ht 67.0 in | Wt 200.0 lb

## 2015-09-30 DIAGNOSIS — I722 Aneurysm of renal artery: Secondary | ICD-10-CM

## 2015-09-30 DIAGNOSIS — I728 Aneurysm of other specified arteries: Secondary | ICD-10-CM

## 2015-09-30 DIAGNOSIS — I10 Essential (primary) hypertension: Secondary | ICD-10-CM | POA: Diagnosis not present

## 2015-09-30 DIAGNOSIS — I724 Aneurysm of artery of lower extremity: Secondary | ICD-10-CM | POA: Diagnosis not present

## 2015-09-30 DIAGNOSIS — R030 Elevated blood-pressure reading, without diagnosis of hypertension: Secondary | ICD-10-CM

## 2015-09-30 DIAGNOSIS — IMO0001 Reserved for inherently not codable concepts without codable children: Secondary | ICD-10-CM

## 2015-09-30 DIAGNOSIS — E7800 Pure hypercholesterolemia, unspecified: Secondary | ICD-10-CM | POA: Insufficient documentation

## 2015-09-30 NOTE — Progress Notes (Signed)
CC: Follow up Renal Artery Aneurysm  History of Present Illness  [name] is a 82 y.o. (04/30/1934) female patient of Dr. Imogene Burn who presents with chief complaint: follow up on SAA embolization (01/28/14). This was complicated by delayed development of R femoral PSA requiring repair (01/31/14). Pt denies any claudication or rest pain. She healed the prior incision without any difficulties. She had one day of severe LUQ pain which resolved. She had her immunization prior to her embolization. 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  She lives in [place]. She does not know what her blood pressure usually runs, but states most of the time her blood pressure is good in her PCP's office in [place].  Pt states that her PCP told her that she has a mild heart murmur.   She had normal a colonoscopy in 2016.  She sees Dr. Charlann Boxer, [facility],  for intermittent radiculopathy in left leg.   Past Medical History  Diagnosis Date  . Hypertension   . High cholesterol   . Stroke 1990's    "mild", denies residual on 01/28/2014  . Pneumonia     "maybe in my teens"  . Hypothyroidism   . Anemia   . History of blood transfusion     "S/P knee OR"  . Arthritis     "was in my knees"  . Recurrent UTI (urinary tract infection)     Social History Social History  Substance Use Topics  . Smoking status: Never Smoker   . Smokeless tobacco: Never Used  . Alcohol Use: No    Family History Family History  Problem Relation Age of Onset  . Hypertension Son     Surgical History Past Surgical History  Procedure Laterality Date  . Muscle biopsy Left 1990's    "knot biopsy from carrying mail bag for years"  . Bladder suspension  2012  . Splenic artery embolization  01/28/2014  . Joint replacement    . Total knee arthroplasty Bilateral 2012  . Vaginal hysterectomy    . Revison of arteriovenous fistula Right 01/31/2014   Procedure: REPAIR PSEUDOANEURYSM;  Surgeon: Nada Libman, MD;  Location: [facility] OR;  Service: Vascular;  Laterality: Right;  Repair of Femoral Pseudoaneurysm.  . Femur im nail Right 03/23/2014    Procedure: INTRAMEDULLARY (IM) NAIL FEMORAL;  Surgeon: Shelda Pal, MD;  Location: [facility] ORS;  Service: Orthopedics;  Laterality: Right;  . Visceral angiogram N/A 01/28/2014    Procedure: VISCERAL ANGIOGRAM;  Surgeon: Fransisco Hertz, MD;  Location: [facility] CATH LAB;  Service: Cardiovascular;  Laterality: N/A;  . Embolization N/A 01/28/2014    Procedure: EMBOLIZATION;  Surgeon: Fransisco Hertz, MD;  Location: [facility] CATH LAB;  Service: Cardiovascular;  Laterality: N/A;  Splenic Artery    Allergies  Allergen Reactions  . Aspirin Other (See Comments)    Causes blood in urine    Current Outpatient Prescriptions  Medication Sig Dispense Refill  . bethanechol (URECHOLINE) 50 MG tablet Take 50 mg by mouth 2 (two) times daily.    . Calcium Carbonate-Vitamin D (CALCIUM 500 + D PO) Take 500 mg by mouth 2 (two) times daily.    . cephALEXin (KEFLEX) 500 MG capsule     . cholecalciferol (VITAMIN D) 1000 UNITS tablet Take 2,000 Units by mouth daily.    . clopidogrel (PLAVIX) 75 MG tablet Take 75 mg by mouth at bedtime.     . Cranberry-Vitamin C-Vitamin E (  CRANBERRY PLUS VITAMIN C) 140-100-3 MG-MG-UNIT CAPS Take 2,000 Units by mouth 2 (two) times daily.    Marland Kitchen docusate sodium 100 MG CAPS Take 100 mg by mouth 2 (two) times daily. 10 capsule 0  . ezetimibe (ZETIA) 10 MG tablet Take 10 mg by mouth at bedtime.     . feeding supplement, ENSURE COMPLETE, (ENSURE COMPLETE) LIQD Take 237 mLs by mouth daily. 20 Bottle 0  . ferrous sulfate 325 (65 FE) MG tablet Take 1 tablet (325 mg total) by mouth 3 (three) times daily after meals.  3  . folic acid (FOLVITE) 400 MCG tablet Take 400 mcg by mouth daily.    Marland Kitchen HYDROcodone-acetaminophen (NORCO/VICODIN) 5-325 MG per tablet Take 1-2 tablets by mouth every 4 (four) hours as needed for moderate  pain. 100 tablet 0  . labetalol (NORMODYNE) 200 MG tablet Take 300 mg by mouth 2 (two) times daily. Take 1 1/2 tablets twice daily    . levothyroxine (SYNTHROID, LEVOTHROID) 50 MCG tablet Take 50 mcg by mouth daily before breakfast.    . Omega-3 Fatty Acids (FISH OIL) 1000 MG CAPS Take 1,000 mg by mouth 2 (two) times daily.    . polyethylene glycol (MIRALAX / GLYCOLAX) packet Take 17 g by mouth daily as needed for mild constipation. 14 each 0  . sulfamethoxazole-trimethoprim (BACTRIM DS,SEPTRA DS) 800-160 MG per tablet     . tamsulosin (FLOMAX) 0.4 MG CAPS capsule Take 0.4 mg by mouth 2 (two) times daily.    Marland Kitchen trimethoprim (TRIMPEX) 100 MG tablet      No current facility-administered medications for this visit.    REVIEW OF SYSTEMS: see HPI for pertinent positives and negatives    Physical Examination  Filed Vitals:   09/30/15 1215 09/30/15 1218  BP: 164/92 154/91  Pulse: 65 64  Temp: 97.2 F (36.2 C)   Resp: 16   Height: 5\' 7"  (1.702 m)   Weight: 200 lb (90.719 kg)   SpO2: 98%    Body mass index is 31.32 kg/(m^2).   General: A&O x 3, WD, obese female  Pulmonary: Sym exp, good air movt, CTAB, no rales, rhonchi, or wheezing  Cardiac: RRR, Nl S1, S2, + murmur  Vascular: Vessel Right Left  Radial Palpable Palpable  Carotid Palpable, without bruit Palpable, without bruit  Aorta Not palpable N/A  Femoral 3+Palpable 1+Palpable  Popliteal Not palpable Not palpable  PT Not Palpable Not Palpable  DP 2+Palpable 2+Palpable   Gastrointestinal: soft, NTND, -G/R, - HSM, - palpable masses, - CVAT B  Musculoskeletal: M/S 5/5 throughout, extremities without ischemic changes   Neurologic: Pain and light touch intact in extremities , Motor exam as listed above. CN 2-12 intact.              Non-Invasive Vascular Imaging  Renal Duplex (Date: 09/30/2015) RENAL ARTERY DUPLEX EVALUATION    INDICATION: Follow-up left renal artery  aneurysm    PREVIOUS INTERVENTION(S): Previous embolic coil placed in splenic artery aneurysm    DUPLEX EXAM:     AORTA Peak Systolic Velocity (cm/s): 100    RIGHT  LEFT   Peak Systolic Velocities (cm/s) Comments  Peak Systolic Velocities (cm/s) Comments    Renal Artery Origin/Proximal 130     Renal Artery Mid 117     Renal Artery Distal 109     Accessory Renal Artery (when present)      Renal / Aortic Ratio (RAR) 1.3   Kidney Size (cm) 12.8    Renal Vein Patent  ADDITIONAL FINDINGS: Splenic artery aneurysm was well visualized and measures 2.9cm on today's exam.    IMPRESSION: 1. Patent left renal artery without evidence of significant stenosis. 2. Aneurysm of the distal renal artery measures 1.1cm and appears to be distal to the renal border in the hilum.    Compared to the previous exam:  Stable aneurysm of the left renal artery.     Medical Decision Making  [name] is a 82 y.o. female who presents with: stable left renal artery aneurysm with maximum diameter of 1.1 cm, same as 03/18/15.  Based on the patient's vascular studies and examination, I have offered the patient: left renal artery duplex in 1 year.  Splenic artery at 2.9 cm is smaller than before the embolization (discussed with Dr. Imogene Burn).  I advised pt to see her PCP as soon as possible re her elevated blood pressure. Dr. Nicky Pugh assessment from 09/17/14:  Mesenteric duplex done today demonstrates the difficulty with follow-up on these embolization cases post-op. Without expertise with such, getting a good evaluation of the residual sac is impossible. Based on my intraoperative imaging, I suspect complete thrombosis of the sac hence the lack of mention on the report today. I don't think the expense and radiation exposure of a repeat CTA is indicated in this case.  I would continue surveillance of the L renal artery aneurysm with L renal duplex, which will be scheduled in 6 months then annually if no  significant change.   I discussed in depth with the patient the nature of atherosclerosis, and emphasized the importance of maximal medical management including strict control of blood pressure, blood glucose, and lipid levels, antiplatelet agents, obtaining regular exercise, and cessation of smoking.  The patient is aware that without maximal medical management the underlying atherosclerotic disease process will progress, limiting the benefit of any interventions.  Thank you for allowing [place] to participate in this patient's care.  Emonnie Cannady, Carma Lair, RN, MSN, FNP-C [facility] Office: [phone]  09/30/2015, 12:09 PM  Clinic MD:  Imogene Burn

## 2015-09-30 NOTE — Patient Instructions (Addendum)
Hypertension Hypertension, commonly called high blood pressure, is when the force of blood pumping through your arteries is too strong. Your arteries are the blood vessels that carry blood from your heart throughout your body. A blood pressure reading consists of a higher number over a lower number, such as 110/72. The higher number (systolic) is the pressure inside your arteries when your heart pumps. The lower number (diastolic) is the pressure inside your arteries when your heart relaxes. Ideally you want your blood pressure below 120/80. Hypertension forces your heart to work harder to pump blood. Your arteries may become narrow or stiff. Having untreated or uncontrolled hypertension can cause heart attack, stroke, kidney disease, and other problems. RISK FACTORS Some risk factors for high blood pressure are controllable. Others are not.  Risk factors you cannot control include:   Race. You may be at higher risk if you are African American.  Age. Risk increases with age.  Gender. Men are at higher risk than women before age 45 years. After age 65, women are at higher risk than men. Risk factors you can control include:  Not getting enough exercise or physical activity.  Being overweight.  Getting too much fat, sugar, calories, or salt in your diet.  Drinking too much alcohol. SIGNS AND SYMPTOMS Hypertension does not usually cause signs or symptoms. Extremely high blood pressure (hypertensive crisis) may cause headache, anxiety, shortness of breath, and nosebleed. DIAGNOSIS To check if you have hypertension, your health care provider will measure your blood pressure while you are seated, with your arm held at the level of your heart. It should be measured at least twice using the same arm. Certain conditions can cause a difference in blood pressure between your right and left arms. A blood pressure reading that is higher than normal on one occasion does not mean that you need treatment. If  it is not clear whether you have high blood pressure, you may be asked to return on a different day to have your blood pressure checked again. Or, you may be asked to monitor your blood pressure at home for 1 or more weeks. TREATMENT Treating high blood pressure includes making lifestyle changes and possibly taking medicine. Living a healthy lifestyle can help lower high blood pressure. You may need to change some of your habits. Lifestyle changes may include:  Following the DASH diet. This diet is high in fruits, vegetables, and whole grains. It is low in salt, red meat, and added sugars.  Keep your sodium intake below 2,300 mg per day.  Getting at least 30-45 minutes of aerobic exercise at least 4 times per week.  Losing weight if necessary.  Not smoking.  Limiting alcoholic beverages.  Learning ways to reduce stress. Your health care provider may prescribe medicine if lifestyle changes are not enough to get your blood pressure under control, and if one of the following is true:  You are 18-59 years of age and your systolic blood pressure is above 140.  You are 60 years of age or older, and your systolic blood pressure is above 150.  Your diastolic blood pressure is above 90.  You have diabetes, and your systolic blood pressure is over 140 or your diastolic blood pressure is over 90.  You have kidney disease and your blood pressure is above 140/90.  You have heart disease and your blood pressure is above 140/90. Your personal target blood pressure may vary depending on your medical conditions, your age, and other factors. HOME CARE INSTRUCTIONS    Have your blood pressure rechecked as directed by your health care provider.   Take medicines only as directed by your health care provider. Follow the directions carefully. Blood pressure medicines must be taken as prescribed. The medicine does not work as well when you skip doses. Skipping doses also puts you at risk for  problems.  Do not smoke.   Monitor your blood pressure at home as directed by your health care provider. SEEK MEDICAL CARE IF:   You think you are having a reaction to medicines taken.  You have recurrent headaches or feel dizzy.  You have swelling in your ankles.  You have trouble with your vision. SEEK IMMEDIATE MEDICAL CARE IF:  You develop a severe headache or confusion.  You have unusual weakness, numbness, or feel faint.  You have severe chest or abdominal pain.  You vomit repeatedly.  You have trouble breathing. MAKE SURE YOU:   Understand these instructions.  Will watch your condition.  Will get help right away if you are not doing well or get worse.   This information is not intended to replace advice given to you by your health care provider. Make sure you discuss any questions you have with your health care provider.   Document Released: 08/27/2005 Document Revised: 01/11/2015 Document Reviewed: 06/19/2013 Elsevier Interactive Patient Education 2016 [employer].    Your blood pressure in our office today was  164/92 And  154/91

## 2015-09-30 NOTE — Progress Notes (Signed)
Filed Vitals:   09/30/15 1215 09/30/15 1218  BP: 164/92 154/91  Pulse: 65 64  Temp: 97.2 F (36.2 C)   Resp: 16   Height: 5\' 7"  (1.702 m)   Weight: 200 lb (90.719 kg)   SpO2: 98%

## 2015-09-30 NOTE — Addendum Note (Signed)
Addended by: Dorthula Rue L on: 09/30/2015 02:19 PM   Modules accepted: Orders

## 2015-10-03 DIAGNOSIS — Z6831 Body mass index (BMI) 31.0-31.9, adult: Secondary | ICD-10-CM | POA: Diagnosis not present

## 2015-10-03 DIAGNOSIS — I10 Essential (primary) hypertension: Secondary | ICD-10-CM | POA: Diagnosis not present

## 2015-10-06 DIAGNOSIS — N3090 Cystitis, unspecified without hematuria: Secondary | ICD-10-CM | POA: Diagnosis not present

## 2015-10-17 DIAGNOSIS — I10 Essential (primary) hypertension: Secondary | ICD-10-CM | POA: Diagnosis not present

## 2015-10-17 DIAGNOSIS — Z6832 Body mass index (BMI) 32.0-32.9, adult: Secondary | ICD-10-CM | POA: Diagnosis not present

## 2015-10-18 DIAGNOSIS — H25813 Combined forms of age-related cataract, bilateral: Secondary | ICD-10-CM | POA: Diagnosis not present

## 2015-10-27 DIAGNOSIS — M79605 Pain in left leg: Secondary | ICD-10-CM | POA: Diagnosis not present

## 2015-11-03 DIAGNOSIS — I639 Cerebral infarction, unspecified: Secondary | ICD-10-CM | POA: Diagnosis not present

## 2015-11-03 DIAGNOSIS — E559 Vitamin D deficiency, unspecified: Secondary | ICD-10-CM | POA: Diagnosis not present

## 2015-11-03 DIAGNOSIS — R7301 Impaired fasting glucose: Secondary | ICD-10-CM | POA: Diagnosis not present

## 2015-11-03 DIAGNOSIS — E785 Hyperlipidemia, unspecified: Secondary | ICD-10-CM | POA: Diagnosis not present

## 2015-11-03 DIAGNOSIS — D509 Iron deficiency anemia, unspecified: Secondary | ICD-10-CM | POA: Diagnosis not present

## 2015-11-03 DIAGNOSIS — E669 Obesity, unspecified: Secondary | ICD-10-CM | POA: Diagnosis not present

## 2015-11-03 DIAGNOSIS — I10 Essential (primary) hypertension: Secondary | ICD-10-CM | POA: Diagnosis not present

## 2015-11-03 DIAGNOSIS — E039 Hypothyroidism, unspecified: Secondary | ICD-10-CM | POA: Diagnosis not present

## 2015-11-03 DIAGNOSIS — Z6833 Body mass index (BMI) 33.0-33.9, adult: Secondary | ICD-10-CM | POA: Diagnosis not present

## 2015-11-15 DIAGNOSIS — R339 Retention of urine, unspecified: Secondary | ICD-10-CM | POA: Diagnosis not present

## 2015-11-15 DIAGNOSIS — R351 Nocturia: Secondary | ICD-10-CM | POA: Diagnosis not present

## 2015-11-15 DIAGNOSIS — N3090 Cystitis, unspecified without hematuria: Secondary | ICD-10-CM | POA: Diagnosis not present

## 2015-11-15 DIAGNOSIS — N390 Urinary tract infection, site not specified: Secondary | ICD-10-CM | POA: Diagnosis not present

## 2015-12-01 DIAGNOSIS — I10 Essential (primary) hypertension: Secondary | ICD-10-CM | POA: Diagnosis not present

## 2015-12-01 DIAGNOSIS — Z6833 Body mass index (BMI) 33.0-33.9, adult: Secondary | ICD-10-CM | POA: Diagnosis not present

## 2016-01-06 DIAGNOSIS — N3090 Cystitis, unspecified without hematuria: Secondary | ICD-10-CM | POA: Diagnosis not present

## 2016-01-12 DIAGNOSIS — I63511 Cerebral infarction due to unspecified occlusion or stenosis of right middle cerebral artery: Secondary | ICD-10-CM | POA: Diagnosis not present

## 2016-01-12 DIAGNOSIS — G459 Transient cerebral ischemic attack, unspecified: Secondary | ICD-10-CM | POA: Diagnosis not present

## 2016-01-12 DIAGNOSIS — I6789 Other cerebrovascular disease: Secondary | ICD-10-CM | POA: Diagnosis not present

## 2016-01-12 DIAGNOSIS — E785 Hyperlipidemia, unspecified: Secondary | ICD-10-CM | POA: Diagnosis present

## 2016-01-12 DIAGNOSIS — R200 Anesthesia of skin: Secondary | ICD-10-CM | POA: Diagnosis not present

## 2016-01-12 DIAGNOSIS — E039 Hypothyroidism, unspecified: Secondary | ICD-10-CM | POA: Diagnosis present

## 2016-01-12 DIAGNOSIS — G0490 Encephalitis and encephalomyelitis, unspecified: Secondary | ICD-10-CM | POA: Diagnosis not present

## 2016-01-12 DIAGNOSIS — I10 Essential (primary) hypertension: Secondary | ICD-10-CM | POA: Diagnosis present

## 2016-01-12 DIAGNOSIS — I63411 Cerebral infarction due to embolism of right middle cerebral artery: Secondary | ICD-10-CM | POA: Diagnosis present

## 2016-01-12 DIAGNOSIS — M810 Age-related osteoporosis without current pathological fracture: Secondary | ICD-10-CM | POA: Diagnosis present

## 2016-01-12 DIAGNOSIS — N390 Urinary tract infection, site not specified: Secondary | ICD-10-CM | POA: Diagnosis not present

## 2016-01-12 DIAGNOSIS — Z79899 Other long term (current) drug therapy: Secondary | ICD-10-CM | POA: Diagnosis not present

## 2016-01-12 DIAGNOSIS — R531 Weakness: Secondary | ICD-10-CM | POA: Diagnosis not present

## 2016-01-12 DIAGNOSIS — Z8673 Personal history of transient ischemic attack (TIA), and cerebral infarction without residual deficits: Secondary | ICD-10-CM | POA: Diagnosis not present

## 2016-01-18 DIAGNOSIS — R200 Anesthesia of skin: Secondary | ICD-10-CM | POA: Diagnosis not present

## 2016-01-18 DIAGNOSIS — R531 Weakness: Secondary | ICD-10-CM | POA: Diagnosis not present

## 2016-01-18 DIAGNOSIS — I6789 Other cerebrovascular disease: Secondary | ICD-10-CM | POA: Diagnosis not present

## 2016-01-19 ENCOUNTER — Inpatient Hospital Stay (HOSPITAL_COMMUNITY)
Admission: EM | Admit: 2016-01-19 | Discharge: 2016-01-23 | DRG: 065 | Disposition: A | Discharge status: Rehabilitation Facility | Payer: Medicare Other | Attending: Internal Medicine | Admitting: Internal Medicine

## 2016-01-19 ENCOUNTER — Inpatient Hospital Stay (HOSPITAL_COMMUNITY): Payer: Medicare Other

## 2016-01-19 ENCOUNTER — Encounter (HOSPITAL_COMMUNITY): Payer: Self-pay

## 2016-01-19 ENCOUNTER — Emergency Department (HOSPITAL_COMMUNITY): Payer: Medicare Other

## 2016-01-19 DIAGNOSIS — R29810 Facial weakness: Secondary | ICD-10-CM | POA: Diagnosis present

## 2016-01-19 DIAGNOSIS — I69354 Hemiplegia and hemiparesis following cerebral infarction affecting left non-dominant side: Secondary | ICD-10-CM | POA: Diagnosis present

## 2016-01-19 DIAGNOSIS — G8190 Hemiplegia, unspecified affecting unspecified side: Secondary | ICD-10-CM | POA: Diagnosis not present

## 2016-01-19 DIAGNOSIS — E039 Hypothyroidism, unspecified: Secondary | ICD-10-CM

## 2016-01-19 DIAGNOSIS — N390 Urinary tract infection, site not specified: Secondary | ICD-10-CM

## 2016-01-19 DIAGNOSIS — I639 Cerebral infarction, unspecified: Secondary | ICD-10-CM | POA: Diagnosis present

## 2016-01-19 DIAGNOSIS — Z87440 Personal history of urinary (tract) infections: Secondary | ICD-10-CM | POA: Diagnosis not present

## 2016-01-19 DIAGNOSIS — I10 Essential (primary) hypertension: Secondary | ICD-10-CM | POA: Diagnosis not present

## 2016-01-19 DIAGNOSIS — K219 Gastro-esophageal reflux disease without esophagitis: Secondary | ICD-10-CM | POA: Diagnosis present

## 2016-01-19 DIAGNOSIS — Z7982 Long term (current) use of aspirin: Secondary | ICD-10-CM | POA: Diagnosis not present

## 2016-01-19 DIAGNOSIS — R531 Weakness: Secondary | ICD-10-CM | POA: Diagnosis not present

## 2016-01-19 DIAGNOSIS — D62 Acute posthemorrhagic anemia: Secondary | ICD-10-CM | POA: Diagnosis present

## 2016-01-19 DIAGNOSIS — Z8673 Personal history of transient ischemic attack (TIA), and cerebral infarction without residual deficits: Secondary | ICD-10-CM | POA: Diagnosis not present

## 2016-01-19 DIAGNOSIS — Z96653 Presence of artificial knee joint, bilateral: Secondary | ICD-10-CM | POA: Diagnosis present

## 2016-01-19 DIAGNOSIS — E860 Dehydration: Secondary | ICD-10-CM | POA: Diagnosis not present

## 2016-01-19 DIAGNOSIS — I69322 Dysarthria following cerebral infarction: Secondary | ICD-10-CM | POA: Diagnosis not present

## 2016-01-19 DIAGNOSIS — R269 Unspecified abnormalities of gait and mobility: Secondary | ICD-10-CM | POA: Diagnosis not present

## 2016-01-19 DIAGNOSIS — E038 Other specified hypothyroidism: Secondary | ICD-10-CM | POA: Diagnosis not present

## 2016-01-19 DIAGNOSIS — E785 Hyperlipidemia, unspecified: Secondary | ICD-10-CM | POA: Diagnosis not present

## 2016-01-19 DIAGNOSIS — Z79899 Other long term (current) drug therapy: Secondary | ICD-10-CM | POA: Diagnosis not present

## 2016-01-19 DIAGNOSIS — Z7902 Long term (current) use of antithrombotics/antiplatelets: Secondary | ICD-10-CM | POA: Diagnosis not present

## 2016-01-19 DIAGNOSIS — R471 Dysarthria and anarthria: Secondary | ICD-10-CM | POA: Diagnosis present

## 2016-01-19 DIAGNOSIS — B9620 Unspecified Escherichia coli [E. coli] as the cause of diseases classified elsewhere: Secondary | ICD-10-CM | POA: Diagnosis present

## 2016-01-19 DIAGNOSIS — I6629 Occlusion and stenosis of unspecified posterior cerebral artery: Secondary | ICD-10-CM | POA: Diagnosis present

## 2016-01-19 DIAGNOSIS — G8194 Hemiplegia, unspecified affecting left nondominant side: Secondary | ICD-10-CM | POA: Diagnosis not present

## 2016-01-19 DIAGNOSIS — E7800 Pure hypercholesterolemia, unspecified: Secondary | ICD-10-CM | POA: Diagnosis present

## 2016-01-19 DIAGNOSIS — I6521 Occlusion and stenosis of right carotid artery: Secondary | ICD-10-CM | POA: Diagnosis not present

## 2016-01-19 DIAGNOSIS — R1013 Epigastric pain: Secondary | ICD-10-CM | POA: Diagnosis not present

## 2016-01-19 DIAGNOSIS — I63511 Cerebral infarction due to unspecified occlusion or stenosis of right middle cerebral artery: Secondary | ICD-10-CM | POA: Diagnosis not present

## 2016-01-19 DIAGNOSIS — I679 Cerebrovascular disease, unspecified: Secondary | ICD-10-CM | POA: Insufficient documentation

## 2016-01-19 DIAGNOSIS — I69398 Other sequelae of cerebral infarction: Secondary | ICD-10-CM | POA: Diagnosis not present

## 2016-01-19 DIAGNOSIS — F419 Anxiety disorder, unspecified: Secondary | ICD-10-CM | POA: Diagnosis not present

## 2016-01-19 DIAGNOSIS — I69392 Facial weakness following cerebral infarction: Secondary | ICD-10-CM | POA: Diagnosis not present

## 2016-01-19 LAB — URINALYSIS, ROUTINE W REFLEX MICROSCOPIC
BILIRUBIN URINE: NEGATIVE
Glucose, UA: NEGATIVE mg/dL
Hgb urine dipstick: NEGATIVE
Ketones, ur: NEGATIVE mg/dL
NITRITE: NEGATIVE
PH: 6 (ref 5.0–8.0)
Protein, ur: NEGATIVE mg/dL
SPECIFIC GRAVITY, URINE: 1.009 (ref 1.005–1.030)

## 2016-01-19 LAB — ETHANOL

## 2016-01-19 LAB — COMPREHENSIVE METABOLIC PANEL
ALT: 45 U/L (ref 14–54)
ANION GAP: 11 (ref 5–15)
AST: 41 U/L (ref 15–41)
Albumin: 4.3 g/dL (ref 3.5–5.0)
Alkaline Phosphatase: 76 U/L (ref 38–126)
BUN: 22 mg/dL — ABNORMAL HIGH (ref 6–20)
CALCIUM: 10.1 mg/dL (ref 8.9–10.3)
CHLORIDE: 106 mmol/L (ref 101–111)
CO2: 20 mmol/L — AB (ref 22–32)
Creatinine, Ser: 0.79 mg/dL (ref 0.44–1.00)
GFR calc non Af Amer: >60 mL/min (ref >60)
Glucose, Bld: 118 mg/dL — ABNORMAL HIGH (ref 65–99)
POTASSIUM: 4.5 mmol/L (ref 3.5–5.1)
SODIUM: 137 mmol/L (ref 135–145)
Total Bilirubin: 1.3 mg/dL — ABNORMAL HIGH (ref 0.3–1.2)
Total Protein: 6.5 g/dL (ref 6.5–8.1)

## 2016-01-19 LAB — CBC
HEMATOCRIT: 34.7 % — AB (ref 36.0–46.0)
HEMATOCRIT: 34.4 % — AB (ref 36.0–46.0)
HEMOGLOBIN: 12 g/dL (ref 12.0–15.0)
Hemoglobin: 11.7 g/dL — ABNORMAL LOW (ref 12.0–15.0)
MCH: 30.5 pg (ref 26.0–34.0)
MCH: 32.1 pg (ref 26.0–34.0)
MCHC: 33.7 g/dL (ref 30.0–36.0)
MCHC: 34.9 g/dL (ref 30.0–36.0)
MCV: 90.4 fL (ref 78.0–100.0)
MCV: 92 fL (ref 78.0–100.0)
Platelets: 167 10*3/uL (ref 150–400)
Platelets: 155 10*3/uL (ref 150–400)
RBC: 3.84 MIL/uL — ABNORMAL LOW (ref 3.87–5.11)
RBC: 3.74 MIL/uL — ABNORMAL LOW (ref 3.87–5.11)
RDW: 13.2 % (ref 11.5–15.5)
RDW: 13.2 % (ref 11.5–15.5)
WBC: 7.2 10*3/uL (ref 4.0–10.5)
WBC: 6.6 10*3/uL (ref 4.0–10.5)

## 2016-01-19 LAB — DIFFERENTIAL
BASOS PCT: 1 %
Basophils Absolute: 0.1 10*3/uL (ref 0.0–0.1)
EOS ABS: 0.2 10*3/uL (ref 0.0–0.7)
EOS PCT: 3 %
LYMPHS ABS: 1.9 10*3/uL (ref 0.7–4.0)
Lymphocytes Relative: 27 %
MONO ABS: 0.8 10*3/uL (ref 0.1–1.0)
Monocytes Relative: 11 %
NEUTROS ABS: 4.3 10*3/uL (ref 1.7–7.7)
Neutrophils Relative %: 58 %

## 2016-01-19 LAB — GLUCOSE, CAPILLARY
GLUCOSE-CAPILLARY: 119 mg/dL — AB (ref 65–99)
Glucose-Capillary: 137 mg/dL — ABNORMAL HIGH (ref 65–99)
Glucose-Capillary: 110 mg/dL — ABNORMAL HIGH (ref 65–99)

## 2016-01-19 LAB — I-STAT CHEM 8, ED
BUN: 22 mg/dL — ABNORMAL HIGH (ref 6–20)
CALCIUM ION: 1.25 mmol/L (ref 1.13–1.30)
CHLORIDE: 106 mmol/L (ref 101–111)
Creatinine, Ser: 0.8 mg/dL (ref 0.44–1.00)
GLUCOSE: 115 mg/dL — AB (ref 65–99)
HCT: 36 % (ref 36.0–46.0)
HEMOGLOBIN: 12.2 g/dL (ref 12.0–15.0)
Potassium: 4.4 mmol/L (ref 3.5–5.1)
Sodium: 137 mmol/L (ref 135–145)
TCO2: 20 mmol/L (ref 0–100)

## 2016-01-19 LAB — I-STAT TROPONIN, ED: Troponin i, poc: 0.01 ng/mL (ref 0.00–0.08)

## 2016-01-19 LAB — PROTIME-INR
INR: 1.16 (ref 0.00–1.49)
Prothrombin Time: 15 seconds (ref 11.6–15.2)

## 2016-01-19 LAB — URINE MICROSCOPIC-ADD ON: RBC / HPF: NONE SEEN RBC/hpf (ref 0–5)

## 2016-01-19 LAB — CREATININE, SERUM: Creatinine, Ser: 0.78 mg/dL (ref 0.44–1.00)

## 2016-01-19 LAB — RAPID URINE DRUG SCREEN, HOSP PERFORMED
AMPHETAMINES: NOT DETECTED
BENZODIAZEPINES: NOT DETECTED
Barbiturates: NOT DETECTED
COCAINE: NOT DETECTED
OPIATES: NOT DETECTED
Tetrahydrocannabinol: NOT DETECTED

## 2016-01-19 LAB — APTT: aPTT: 31 seconds (ref 24–37)

## 2016-01-19 MED ORDER — BOOST PO LIQD
237.0000 mL | Freq: Three times a day (TID) | ORAL | Status: DC | PRN
  Filled 2016-01-19: qty 237

## 2016-01-19 MED ORDER — SODIUM CHLORIDE 0.45 % IV SOLN
INTRAVENOUS | Status: DC
  Administered 2016-01-19: 10:00:00 via INTRAVENOUS

## 2016-01-19 MED ORDER — ASPIRIN 325 MG PO TABS
325.0000 mg | ORAL_TABLET | Freq: Every day | ORAL | Status: DC
  Administered 2016-01-19 – 2016-01-23 (×5): 325 mg via ORAL
  Filled 2016-01-19 (×5): qty 1

## 2016-01-19 MED ORDER — ACETAMINOPHEN 650 MG RE SUPP: 650.0000 mg | Freq: Four times a day (QID) | RECTAL | Status: DC | PRN

## 2016-01-19 MED ORDER — SODIUM CHLORIDE 0.9% FLUSH
3.0000 mL | Freq: Two times a day (BID) | INTRAVENOUS | Status: DC
  Administered 2016-01-19 – 2016-01-20 (×2): 3 mL via INTRAVENOUS
  Administered 2016-01-22: 10 mL via INTRAVENOUS
  Administered 2016-01-22: 3 mL via INTRAVENOUS

## 2016-01-19 MED ORDER — EZETIMIBE 10 MG PO TABS
10.0000 mg | ORAL_TABLET | Freq: Every day | ORAL | Status: DC
  Administered 2016-01-19 – 2016-01-22 (×4): 10 mg via ORAL
  Filled 2016-01-19 (×4): qty 1

## 2016-01-19 MED ORDER — LISINOPRIL 20 MG PO TABS
40.0000 mg | ORAL_TABLET | Freq: Every day | ORAL | Status: DC
  Administered 2016-01-19 – 2016-01-22 (×4): 40 mg via ORAL
  Filled 2016-01-19 (×4): qty 2

## 2016-01-19 MED ORDER — OXYCODONE HCL 5 MG PO TABS: 5.0000 mg | ORAL_TABLET | ORAL | Status: DC | PRN

## 2016-01-19 MED ORDER — PRAVASTATIN SODIUM 20 MG PO TABS
20.0000 mg | ORAL_TABLET | Freq: Every day | ORAL | Status: DC
  Administered 2016-01-19 – 2016-01-23 (×5): 20 mg via ORAL
  Filled 2016-01-19 (×5): qty 1

## 2016-01-19 MED ORDER — ONDANSETRON HCL 4 MG/2ML IJ SOLN: 4.0000 mg | Freq: Four times a day (QID) | INTRAMUSCULAR | Status: DC | PRN

## 2016-01-19 MED ORDER — LABETALOL HCL 200 MG PO TABS
300.0000 mg | ORAL_TABLET | Freq: Two times a day (BID) | ORAL | Status: DC
  Administered 2016-01-19 – 2016-01-21 (×5): 300 mg via ORAL
  Filled 2016-01-19 (×5): qty 1

## 2016-01-19 MED ORDER — LORAZEPAM 1 MG PO TABS
2.0000 mg | ORAL_TABLET | Freq: Once | ORAL | Status: AC
  Administered 2016-01-19: 2 mg via ORAL
  Filled 2016-01-19: qty 2

## 2016-01-19 MED ORDER — CLOPIDOGREL BISULFATE 75 MG PO TABS
75.0000 mg | ORAL_TABLET | Freq: Every day | ORAL | Status: DC
  Administered 2016-01-19 – 2016-01-23 (×5): 75 mg via ORAL
  Filled 2016-01-19 (×5): qty 1

## 2016-01-19 MED ORDER — LEVOTHYROXINE SODIUM 50 MCG PO TABS
50.0000 ug | ORAL_TABLET | Freq: Every day | ORAL | Status: DC
  Administered 2016-01-19 – 2016-01-23 (×5): 50 ug via ORAL
  Filled 2016-01-19 (×4): qty 1

## 2016-01-19 MED ORDER — DEXTROSE 5 % IV SOLN
2.0000 g | INTRAVENOUS | Status: AC
  Administered 2016-01-19 – 2016-01-22 (×4): 2 g via INTRAVENOUS
  Filled 2016-01-19 (×4): qty 2

## 2016-01-19 MED ORDER — ACETAMINOPHEN 325 MG PO TABS
650.0000 mg | ORAL_TABLET | Freq: Four times a day (QID) | ORAL | Status: DC | PRN
  Administered 2016-01-20: 650 mg via ORAL
  Filled 2016-01-19: qty 2

## 2016-01-19 MED ORDER — ONDANSETRON HCL 4 MG PO TABS: 4.0000 mg | ORAL_TABLET | Freq: Four times a day (QID) | ORAL | Status: DC | PRN

## 2016-01-19 MED ORDER — ASPIRIN 300 MG RE SUPP: 300.0000 mg | Freq: Every day | RECTAL | Status: DC

## 2016-01-19 MED ORDER — HEPARIN SODIUM (PORCINE) 5000 UNIT/ML IJ SOLN
5000.0000 [IU] | Freq: Three times a day (TID) | INTRAMUSCULAR | Status: DC
  Administered 2016-01-19 – 2016-01-23 (×13): 5000 [IU] via SUBCUTANEOUS
  Filled 2016-01-19 (×12): qty 1

## 2016-01-19 MED ORDER — STROKE: EARLY STAGES OF RECOVERY BOOK
Freq: Once | Status: AC
  Administered 2016-01-19: 1
  Filled 2016-01-19: qty 1

## 2016-01-19 MED ORDER — AMLODIPINE BESYLATE 5 MG PO TABS
5.0000 mg | ORAL_TABLET | Freq: Every day | ORAL | Status: DC
  Administered 2016-01-19: 5 mg via ORAL
  Filled 2016-01-19: qty 1

## 2016-01-19 NOTE — ED Notes (Signed)
Faxed a release of info to [facility], for Dr. Dillon Bjork.

## 2016-01-19 NOTE — Care Management Note (Signed)
Case Management Note  Patient Details  Name: RIYANNA RADKA MRN: [id number] Date of Birth: 03-30-1934  Subjective/Objective:                    Action/Plan: Patient was admitted from home with CVA. Will follow for discharge needs pending PT/OT evals and physician orders.  Expected Discharge Date:                  Expected Discharge Plan:     In-House Referral:     Discharge planning Services     Post Acute Care Choice:    Choice offered to:     DME Arranged:    DME Agency:     HH Arranged:    HH Agency:     Status of Service:  In process, will continue to follow  Medicare Important Message Given:    Date Medicare IM Given:    Medicare IM give by:    Date Additional Medicare IM Given:    Additional Medicare Important Message give by:     If discussed at [place] of Stay Meetings, dates discussed:    Additional Comments:  Rolm Baptise, RN 01/19/2016, 9:56 AM [phone]

## 2016-01-19 NOTE — ED Notes (Signed)
Pt arrived via [facility] c/o weakness.  Pt had stroke Thursday with left sided deficits residing.  Pt states weakness is not worse since the stroke just wasn't expecting it to last so long.  Pt lives at home with her son.

## 2016-01-19 NOTE — Progress Notes (Signed)
STROKE TEAM PROGRESS NOTE   HISTORY OF PRESENT ILLNESS [name] is an 82 y.o. female history of hypertension, hypercholesterolemia, hypothyroidism, and recurrent urinary tract infections and stroke one week ago presenting with persistent weakness of her left hand. She is having difficulty holding objects. She came to the emergency room tonight after a fall in her bathroom. She doesn't know how she fell. She was taken Plavix prior to her stroke, which was stopped at discharge for reasons that are unclear. Patient does not recall being told she had an intracranial hemorrhage. She had facial weakness as well as speech changes which are no different from when she was admitted for stroke last week. An weakness is no worse. MRI of the brain showed additional new ischemic infarctions involving left MCA territory, compared to findings on MRI dated 01/13/2016. Patient was not administered IV t-PA. She was admitted for further evaluation and treatment.   SUBJECTIVE (INTERVAL HISTORY) No family at bedside. She still has left sided weakness. BP stable. Found to have UTI on rocephin. On ASA and plavix currently. MRA pending and MRI showed slight extension from previous stroke.    OBJECTIVE Pulse Rate:  [71-101] 77 (05/11 0847) Cardiac Rhythm:  [-]  Resp:  [16-27] 20 (05/11 0847) BP: (114-181)/(69-99) 151/81 mmHg (05/11 0847) SpO2:  [91 %-100 %] 100 % (05/11 0847)  CBC:  Recent Labs Lab 01/19/16 0207 01/19/16 0212 01/19/16 0846  WBC 7.2  --  6.6  NEUTROABS 4.3  --   --   HGB 11.7* 12.2 12.0  HCT 34.7* 36.0 34.4*  MCV 90.4  --  92.0  PLT 167  --  99991111    Basic Metabolic Panel:  Recent Labs Lab 01/19/16 0207 01/19/16 0212  NA 137 137  K 4.5 4.4  CL 106 106  CO2 20*  --   GLUCOSE 118* 115*  BUN 22* 22*  CREATININE 0.79 0.80  CALCIUM 10.1  --     Lipid Panel: No results found for: CHOL, TRIG, HDL, CHOLHDL, VLDL, LDLCALC HgbA1c: No results found for: HGBA1C Urine Drug Screen:     Component Value Date/Time   LABOPIA NONE DETECTED 01/19/2016 0415   COCAINSCRNUR NONE DETECTED 01/19/2016 0415   LABBENZ NONE DETECTED 01/19/2016 0415   AMPHETMU NONE DETECTED 01/19/2016 0415   THCU NONE DETECTED 01/19/2016 0415   LABBARB NONE DETECTED 01/19/2016 0415      IMAGING  Dg Chest 2 View 01/19/2016  Stable chronic interstitial lung disease and COPD. Borderline heart size.   Mr Brain Wo Contrast 01/19/2016  Mild local propagation of RIGHT MCA territory acute small infarcts. Attenuated RIGHT middle cerebral artery mid to distal segments would be better characterized on MRA versus CTA HEAD. Involutional changes. Moderate chronic small vessel ischemic disease.   MRA pending  carotid Doppler  minimal amount of bilateral atherosclerotic plaque with no significant hemodynamic stenosis,   2-D echo  without evidence of thrombus showing EF of 60-65% and mild concentric left ventricular hypertrophy.   PHYSICAL EXAM  Temp:  [98.3 F (36.8 C)-98.4 F (36.9 C)] 98.4 F (36.9 C) (05/11 1200) Pulse Rate:  [71-101] 77 (05/11 0847) Resp:  [16-27] 18 (05/11 1200) BP: (114-181)/(69-99) 141/82 mmHg (05/11 1200) SpO2:  [91 %-100 %] 100 % (05/11 1200)  General - Well nourished, well developed, in no apparent distress.  Ophthalmologic - Fundi not visualized due to noncooperation.  Cardiovascular - Regular rate and rhythm with no murmur.  Mental Status -  Level of arousal and orientation to  time, place, and person were intact. Language including expression, naming, repetition, comprehension was assessed and found intact. Fund of Knowledge was assessed and was intact.  Cranial Nerves II - XII - II - Visual field intact OU. III, IV, VI - Extraocular movements intact. V - Facial sensation intact bilaterally. VII - left facial droop. VIII - Hearing & vestibular intact bilaterally. X - Palate elevates symmetrically. XI - Chin turning & shoulder shrug intact bilaterally. XII -  Tongue protrusion to the left.  Motor Strength - The patient's strength was normal in all extremities except LUE 3/5 proximal and 4/5 distally and pronator drift was absent.  Bulk was normal and fasciculations were absent.   Motor Tone - Muscle tone was assessed at the neck and appendages and was normal.  Reflexes - The patient's reflexes were 1+ in all extremities and she had no pathological reflexes.  Sensory - Light touch, temperature/pinprick were assessed and were symmetrical.    Coordination - The patient had normal movements in the right hand with no ataxia or dysmetria.  Tremor was absent.  Gait and Station - not tested due to safety concerns   ASSESSMENT/PLAN [name] is a 82 y.o. female with history of hypertension, hypercholesterolemia, hypothyroidism, and recurrent urinary tract infections and stroke one week presenting with persistent left hand weakness. She did not receive IV t-PA.   Stroke:  Non-dominant right CR and SO infarcts in same area as infarct 1 week ago with slight extension,  secondary to small vessel disease source  Resultant  L hand weakness and left facial droop  MRI  Right CR and SO infarcts (in same area as infarcts one week ago)  MRA  pending   Carotid Doppler at [facility] last week  No significant stenosis   2D Echo  at [place] last week  ER 60-65% No source of embolus   LDL at [place] last week  105  HgbA1c at [place] last week   5.3  Heparin 5000 units sq tid for VTE prophylaxis  Diet Heart Room service appropriate?: Yes; Fluid consistency:: Thin  No antithrombotic prior to admission, though per Oval Linsey she was discharged on Plavix 75 mg daily, now on aspirin 325 mg daily and clopidogrel 75 mg daily. Recommend to continue plavix as monotherapy unless MRA showed significant intracranial stenosis.   Patient counseled to be compliant with her antithrombotic medications  Ongoing aggressive stroke risk factor  management  Therapy recommendations:  pending   Disposition:  pending   Hypertension  Stable  Permissive hypertension (OK if < 220/120) but gradually normalize in 5-7 days  Avoid hypotension  Hyperlipidemia  Home meds:  zetia  LDL 105, goal < 70  Now On pravachol 20   Continue statin and zetia at discharge  Other Stroke Risk Factors  Advanced age  Hx stroke/TIA  01/2016 R MCA infarct  Other Active Problems  UTI - on rocephin  hypothyroidism  Hospital day # 0  Rosalin Hawking, MD PhD Stroke Neurology 01/19/2016 6:28 PM    To contact Stroke Continuity provider, please refer to [email or web address] After hours, contact General Neurology

## 2016-01-19 NOTE — H&P (Signed)
History and Physical    [name] UUV:[id number] DOB: 1933/09/12 DOA: 01/19/2016  Referring MD/NP/PA: Dr Mora Bellman PCP: Paulina Fusi, MD Patient coming from: Home  Chief Complaint: weakness and slurred speech  HPI: [name] is a 82 y.o. female with medical history significant of HTN, CVA, HLD, hypothyroidism, recurrent UTIs, presenting after falling at home and striking her head on the toilet. Patient is unsure of how exactly she fell but denies any actual syncope, dizziness, lightheadedness. Patient states that she simply lost her balance. Patient does report having gradual onset left hand weakness over the past week along with facial numbness and speech difficulty. Patient was seen at [facility] and had an MRI performed with evidence of stroke. She is admitted overnight from 01/12/2016 to 01/13/2016 and sent home with a prescription for ciprofloxacin for possible UTI. Additionally she was told to stop her aspirin and Plavix because of hematuria. Per report patient at that time also had additional studies performed such as carotid Dopplers and echo. Since leaving [facility] on 01/13/2016 she's had gradual progressive worsening of her slurred speech, facial weakness and left upper extremity weakness. Additionally patient states that she is unstable on her feet though denies actual left leg weakness. Denies any dysuria, frequency, chest pain, palpitations, neck stiffness, headache, nausea, vomiting, diarrhea, constipation, DOE, lower sugar swelling, difficulty eating/swallowing.   ED Course: MRI showing stroke  Review of Systems: As per HPI otherwise 10 point review of systems negative.   Past Medical History  Diagnosis Date  . Hypertension   . High cholesterol   . Stroke [facility]) 1990's    "mild", denies residual on 01/28/2014  . Pneumonia     "maybe in my teens"  . Hypothyroidism   . Anemia   . History of blood transfusion     "S/P knee OR"  .  Arthritis     "was in my knees"  . Recurrent UTI (urinary tract infection)     Past Surgical History  Procedure Laterality Date  . Muscle biopsy Left 1990's    "knot biopsy from carrying mail bag for years"  . Bladder suspension  2012  . Splenic artery embolization  01/28/2014  . Joint replacement    . Total knee arthroplasty Bilateral 2012  . Vaginal hysterectomy    . Revison of arteriovenous fistula Right 01/31/2014    Procedure: REPAIR PSEUDOANEURYSM;  Surgeon: Nada Libman, MD;  Location: [facility] OR;  Service: Vascular;  Laterality: Right;  Repair of Femoral Pseudoaneurysm.  . Femur im nail Right 03/23/2014    Procedure: INTRAMEDULLARY (IM) NAIL FEMORAL;  Surgeon: Shelda Pal, MD;  Location: [facility] ORS;  Service: Orthopedics;  Laterality: Right;  . Visceral angiogram N/A 01/28/2014    Procedure: VISCERAL ANGIOGRAM;  Surgeon: Fransisco Hertz, MD;  Location: [facility] CATH LAB;  Service: Cardiovascular;  Laterality: N/A;  . Embolization N/A 01/28/2014    Procedure: EMBOLIZATION;  Surgeon: Fransisco Hertz, MD;  Location: [facility] CATH LAB;  Service: Cardiovascular;  Laterality: N/A;  Splenic Artery     reports that she has never smoked. She has never used smokeless tobacco. She reports that she does not drink alcohol or use illicit drugs.  Allergies  Allergen Reactions  . Aspirin Other (See Comments)    Causes blood in urine    Family History  Problem Relation Age of Onset  . Hypertension Son     Prior to Admission medications   Medication Sig Start Date End Date Taking? Authorizing  Provider  amLODipine (NORVASC) 5 MG tablet Take 5 mg by mouth at bedtime.   Yes Historical Provider, MD  Calcium Carbonate-Vitamin D (CALCIUM 500 + D PO) Take 500 mg by mouth 2 (two) times daily.   Yes Historical Provider, MD  cholecalciferol (VITAMIN D) 1000 UNITS tablet Take 2,000 Units by mouth daily.   Yes Historical Provider, MD  ciprofloxacin (CIPRO) 250 MG tablet Take 250 mg by mouth 2 (two) times daily.   Yes  Historical Provider, MD  Cranberry-Vitamin C-Vitamin E (CRANBERRY PLUS VITAMIN C) 140-100-3 MG-MG-UNIT CAPS Take 2,000 Units by mouth every morning.    Yes Historical Provider, MD  ezetimibe (ZETIA) 10 MG tablet Take 10 mg by mouth at bedtime.    Yes Historical Provider, MD  folic acid (FOLVITE) 400 MCG tablet Take 400 mcg by mouth daily.   Yes Historical Provider, MD  labetalol (NORMODYNE) 200 MG tablet Take 300 mg by mouth 2 (two) times daily. Take 1 1/2 tablets twice daily   Yes Historical Provider, MD  lactose free nutrition (BOOST) LIQD Take 237 mLs by mouth 3 (three) times daily as needed (meal replacement).   Yes Historical Provider, MD  levothyroxine (SYNTHROID, LEVOTHROID) 50 MCG tablet Take 50 mcg by mouth daily before breakfast.   Yes Historical Provider, MD  lisinopril (PRINIVIL,ZESTRIL) 40 MG tablet Take 40 mg by mouth at bedtime.   Yes Historical Provider, MD  Omega-3 Fatty Acids (FISH OIL) 1000 MG CAPS Take 1,000 mg by mouth 2 (two) times daily.   Yes Historical Provider, MD  polyethylene glycol (MIRALAX / GLYCOLAX) packet Take 17 g by mouth daily as needed for mild constipation. 03/27/14  Yes Jeralyn Bennett, MD  trimethoprim (TRIMPEX) 100 MG tablet Take 100 mg by mouth daily.  08/02/14  Yes Historical Provider, MD    Physical Exam: Filed Vitals:   01/19/16 0645 01/19/16 0700 01/19/16 0815 01/19/16 0847  BP: 122/95 147/89 143/81 151/81  Pulse: 75 82 77 77  TempSrc:    Oral  Resp:    20  SpO2: 95% 95% 91% 100%      Constitutional: NAD, calm, comfortable Filed Vitals:   01/19/16 0645 01/19/16 0700 01/19/16 0815 01/19/16 0847  BP: 122/95 147/89 143/81 151/81  Pulse: 75 82 77 77  TempSrc:    Oral  Resp:    20  SpO2: 95% 95% 91% 100%   Eyes:  PERRL, lids and conjunctivae normal ENMT:  Mucous membranes are moist. Posterior pharynx clear of any exudate or lesions.  Neck:  normal, supple, no masses, no thyromegaly Respiratory:  clear to auscultation bilaterally, no  wheezing, no crackles. Normal respiratory effort. No accessory muscle use.  Cardiovascular:  Regular rate and rhythm, no murmurs / rubs / gallops. No extremity edema. 2+ pedal pulses. No carotid bruits.  Abdomen:  no tenderness, no masses palpated. No hepatosplenomegaly. Bowel sounds positive.  Musculoskeletal:  no clubbing / cyanosis. No joint deformity upper and lower extremities. Good ROM, no contractures. Skin:  no rashes, lesions, ulcers. No induration Neurologic:  Left facial droop present. No dysmetria on right, some difficulty with finger to nose on the left but feel that this is due to left upper extremity muscle weakness as opposed to true dysmetria. Lower extremities 5 out of 5 with flexion. Grip strength 4 out of 5 and left hand 5 out of 5 on right hand.  Psychiatric:  Normal judgment and insight. Alert and oriented x 3. Normal mood.    Labs on Admission: I have personally  reviewed following labs and imaging studies  CBC:  Recent Labs Lab 01/19/16 0207 01/19/16 0212  WBC 7.2  --   NEUTROABS 4.3  --   HGB 11.7* 12.2  HCT 34.7* 36.0  MCV 90.4  --   PLT 167  --    Basic Metabolic Panel:  Recent Labs Lab 01/19/16 0207 01/19/16 0212  NA 137 137  K 4.5 4.4  CL 106 106  CO2 20*  --   GLUCOSE 118* 115*  BUN 22* 22*  CREATININE 0.79 0.80  CALCIUM 10.1  --    GFR: CrCl cannot be calculated (Unknown ideal weight.). Liver Function Tests:  Recent Labs Lab 01/19/16 0207  AST 41  ALT 45  ALKPHOS 76  BILITOT 1.3*  PROT 6.5  ALBUMIN 4.3   No results for input(s): LIPASE, AMYLASE in the last 168 hours. No results for input(s): AMMONIA in the last 168 hours. Coagulation Profile:  Recent Labs Lab 01/19/16 0207  INR 1.16   Cardiac Enzymes: No results for input(s): CKTOTAL, CKMB, CKMBINDEX, TROPONINI in the last 168 hours. BNP (last 3 results) No results for input(s): PROBNP in the last 8760 hours. HbA1C: No results for input(s): HGBA1C in the last 72  hours. CBG: No results for input(s): GLUCAP in the last 168 hours. Lipid Profile: No results for input(s): CHOL, HDL, LDLCALC, TRIG, CHOLHDL, LDLDIRECT in the last 72 hours. Thyroid Function Tests: No results for input(s): TSH, T4TOTAL, FREET4, T3FREE, THYROIDAB in the last 72 hours. Anemia Panel: No results for input(s): VITAMINB12, FOLATE, FERRITIN, TIBC, IRON, RETICCTPCT in the last 72 hours. Urine analysis:    Component Value Date/Time   COLORURINE YELLOW 01/19/2016 0415   APPEARANCEUR CLOUDY* 01/19/2016 0415   LABSPEC 1.009 01/19/2016 0415   PHURINE 6.0 01/19/2016 0415   GLUCOSEU NEGATIVE 01/19/2016 0415   HGBUR NEGATIVE 01/19/2016 0415   BILIRUBINUR NEGATIVE 01/19/2016 0415   KETONESUR NEGATIVE 01/19/2016 0415   PROTEINUR NEGATIVE 01/19/2016 0415   UROBILINOGEN 0.2 03/23/2014 0403   NITRITE NEGATIVE 01/19/2016 0415   LEUKOCYTESUR LARGE* 01/19/2016 0415    Creatinine Clearance: CrCl cannot be calculated (Unknown ideal weight.).  Sepsis Labs: @LABRCNTIP (procalcitonin:4,lacticidven:4) )No results found for this or any previous visit (from the past 240 hour(s)).   Radiological Exams on Admission: Mr Sherrin Daisy Contrast  01/19/2016  CLINICAL DATA:  Larey Seat and hit head on toilet tonight. Gradual onset LEFT hand weakness for 1 week. Assess for new stroke. Recent diagnosis of stroke. Off aspirin and Plavix. History of hypertension and hypercholesterolemia. EXAM: MRI HEAD WITHOUT CONTRAST TECHNIQUE: Multiplanar, multiecho pulse sequences of the brain and surrounding structures were obtained without intravenous contrast. COMPARISON:  MRI of the head Jan 13, 2016 FINDINGS: Patchy reduced diffusion RIGHT frontoparietal junction, with a few new punctate foci of reduced diffusion in similar distribution. Low ADC values in confluent area of reduced diffusion associated T2 bright FLAIR signal. No susceptibility artifact to suggest hemorrhage. Punctate foci of susceptibility artifact in bilateral  thalamus, LEFT cerebellum, LEFT dorsal pons can be seen in the setting of chronic hypertension. No midline shift, mass effect or mass lesions. Ventricles and sulci are normal for patient's age. Patchy to confluent supratentorial and pontine white matter FLAIR T2 hyperintensities. No abnormal extra-axial fluid collections. Major intracranial vascular flow voids present at the skullbase. Attenuated flow voids within RIGHT sylvian fissure. Paranasal sinuses and mastoid air cells are well aerated. Ocular globes and orbital contents are normal. No abnormal sellar expansion. No cerebellar tonsillar ectopia. No suspicious calvarial bone marrow  signal. IMPRESSION: Mild local propagation of RIGHT MCA territory acute small infarcts. Attenuated RIGHT middle cerebral artery mid to distal segments would be better characterized on MRA versus CTA HEAD. Involutional changes. Moderate chronic small vessel ischemic disease. Electronically Signed   By: Awilda Metro M.D.   On: 01/19/2016 06:23    Assessment/Plan Active Problems:   HTN (hypertension)   Hypothyroidism   Ischemic stroke (HCC)   Recurrent UTI   HLD (hyperlipidemia)   CVA: MRI showing R MCA infarct. Symptoms consistent w/ MRI findings (LUE weakness and facial droop). Sx started on 01/12/16 but have slowly progressed. Pt told to stop ASA and Plavix at last admission in [place] on 01/13/16. Presented on day of admission after mechanical fall from gait instability. Passed bedside swallow. Upon review of records from [facility] patient had CT and MRI brain performed as well as carotid Dopplers and 2-D echo. MRI at that time consistent with right parietal infarct with right MCA occlusive disease, carotid Doppler showing minimal amount of bilateral atherosclerotic plaque with no significant hemodynamic stenosis, 2-D echo at that time without evidence of thrombus showing EF of 60-65% and mild concentric left ventricular hypertrophy. Pt would benefit from  PT/OT and likely inpt rehab.  - Tele - PT/OT/SLP/ - Neuro to follow - A1c, lipids - MRA - Restart ASA and Plavix    - Start Statin  UTI: pt started on therapy for UTI on 01/12/16 at [facility]. Upon review of records from [facility] patient with Klebsiella pneumonia UTI with greater than 100,000 colonies sensitive to Bactrim, Cipro, ceftriaxone, Levaquin. UA suspisicous for continued UTI. Pt w/ numerous urologic surgeries and freeqeunt UTIs in past. Pt on Trimpex chronically - DC Cipro and Start Ceftriaxone 2gm Q24 - UCX - Hold Trimpex  Hypothyroidism: - continue synthroid  HTN: Pt outside window for benefit from permissive HTN.  - continue home norvasc, lisinopril, and labetalol  HLD: on Zetia outpt - Start Statin   DVT prophylaxis: Hep  Code Status: FULL  Family Communication: none  Disposition Plan: pending improvement and workup - suspect transfer to inpt rehab  Consults called: Neuro  Admission status: Inpatient    Arran Fessel J MD [facility]  If 7PM-7AM, please contact night-coverage [email or web address] Password [email or web address]  01/19/2016, 9:04 AM

## 2016-01-19 NOTE — ED Notes (Signed)
Pt states she is here because she fell tonight and knocked her glasses off.  She c/o left hand numbness and when she tries to pick up something she drops it.

## 2016-01-19 NOTE — ED Provider Notes (Signed)
CSN: [id number]     Arrival date & time 01/19/16  0022 History  By signing my name below, I, Eustaquio Maize, attest that this documentation has been prepared under the direction and in the presence of Everlene Balls, MD. Electronically Signed: Eustaquio Maize, ED Scribe. 01/19/2016. 12:49 AM.   Chief Complaint  Patient presents with  . Weakness   The history is provided by the patient. No language interpreter was used.    HPI Comments:  [name] is a 82 y.o. female brought in by ambulance, who presents to the Emergency Department for ground level fall that occurred earlier tonight. She mentions that tonight she fell and hit her head on the toilet. Pt cannot say how she fell and is unsure if she tripped or felt dizzy. She also complains of gradual onset, constant, left hand weakness x 1 week. Pt reports that 1 week ago on 01/12/2016 she began having facial numbness and speech difficulty. She was evaluated at [facility] and had an MR Brain with findings of a stroke. Pt was admitted to the hospital for 1 day and discharged on Friday, 01/13/2016, with a prescription for Cipro after having hematuria. She mentions that while in the hospital at [facility] she began having left hand weakness and has continued to have it since. Her speech difficulty and facial numbness resolved while in the hospital at [facility]. She denies having left leg weakness at time of stroke or tonight. Pt was told to stop taking Plavix and Aspirin while at [facility] and has not been on them for the past week.  Denies cough, rhinorrhea, sneezing, fever, nausea, vomiting, diarrhea, or any other associated symptoms.   Past Medical History  Diagnosis Date  . Hypertension   . High cholesterol   . Stroke [facility]) 1990's    "mild", denies residual on 01/28/2014  . Pneumonia     "maybe in my teens"  . Hypothyroidism   . Anemia   . History of blood transfusion     "S/P knee OR"  . Arthritis     "was in my knees"  . Recurrent  UTI (urinary tract infection)    Past Surgical History  Procedure Laterality Date  . Muscle biopsy Left 1990's    "knot biopsy from carrying mail bag for years"  . Bladder suspension  2012  . Splenic artery embolization  01/28/2014  . Joint replacement    . Total knee arthroplasty Bilateral 2012  . Vaginal hysterectomy    . Revison of arteriovenous fistula Right 01/31/2014    Procedure: REPAIR PSEUDOANEURYSM;  Surgeon: Serafina Mitchell, MD;  Location: [facility] OR;  Service: Vascular;  Laterality: Right;  Repair of Femoral Pseudoaneurysm.  . Femur im nail Right 03/23/2014    Procedure: INTRAMEDULLARY (IM) NAIL FEMORAL;  Surgeon: Mauri Pole, MD;  Location: [facility] ORS;  Service: Orthopedics;  Laterality: Right;  . Visceral angiogram N/A 01/28/2014    Procedure: VISCERAL ANGIOGRAM;  Surgeon: Conrad [place], MD;  Location: [facility] CATH LAB;  Service: Cardiovascular;  Laterality: N/A;  . Embolization N/A 01/28/2014    Procedure: EMBOLIZATION;  Surgeon: Conrad Buckner, MD;  Location: [facility] CATH LAB;  Service: Cardiovascular;  Laterality: N/A;  Splenic Artery   Family History  Problem Relation Age of Onset  . Hypertension Son    Social History  Substance Use Topics  . Smoking status: Never Smoker   . Smokeless tobacco: Never Used  . Alcohol Use: No   OB History    No data  available     Review of Systems 10 Systems reviewed and all are negative for acute change except as noted in the HPI.   Allergies  Aspirin  Home Medications   Prior to Admission medications   Medication Sig Start Date End Date Taking? Authorizing Provider  bethanechol (URECHOLINE) 50 MG tablet Take 50 mg by mouth 2 (two) times daily.    Historical Provider, MD  Calcium Carbonate-Vitamin D (CALCIUM 500 + D PO) Take 500 mg by mouth 2 (two) times daily.    Historical Provider, MD  cephALEXin (KEFLEX) 500 MG capsule Reported on 09/30/2015 07/02/14   Historical Provider, MD  cholecalciferol (VITAMIN D) 1000 UNITS tablet Take 2,000 Units  by mouth daily.    Historical Provider, MD  clopidogrel (PLAVIX) 75 MG tablet Take 75 mg by mouth at bedtime.     Historical Provider, MD  Cranberry-Vitamin C-Vitamin E (CRANBERRY PLUS VITAMIN C) 140-100-3 MG-MG-UNIT CAPS Take 2,000 Units by mouth 2 (two) times daily.    Historical Provider, MD  docusate sodium 100 MG CAPS Take 100 mg by mouth 2 (two) times daily. 03/24/14   Danae Orleans, PA-C  ezetimibe (ZETIA) 10 MG tablet Take 10 mg by mouth at bedtime.     Historical Provider, MD  feeding supplement, ENSURE COMPLETE, (ENSURE COMPLETE) LIQD Take 237 mLs by mouth daily. 03/27/14   Kelvin Cellar, MD  ferrous sulfate 325 (65 FE) MG tablet Take 1 tablet (325 mg total) by mouth 3 (three) times daily after meals. 03/24/14   Danae Orleans, PA-C  folic acid (FOLVITE) A999333 MCG tablet Take 400 mcg by mouth daily.    Historical Provider, MD  HYDROcodone-acetaminophen (NORCO/VICODIN) 5-325 MG per tablet Take 1-2 tablets by mouth every 4 (four) hours as needed for moderate pain. Patient not taking: Reported on 09/30/2015 03/24/14   Danae Orleans, PA-C  labetalol (NORMODYNE) 200 MG tablet Take 300 mg by mouth 2 (two) times daily. Take 1 1/2 tablets twice daily    Historical Provider, MD  levothyroxine (SYNTHROID, LEVOTHROID) 50 MCG tablet Take 50 mcg by mouth daily before breakfast.    Historical Provider, MD  Omega-3 Fatty Acids (FISH OIL) 1000 MG CAPS Take 1,000 mg by mouth 2 (two) times daily.    Historical Provider, MD  polyethylene glycol (MIRALAX / GLYCOLAX) packet Take 17 g by mouth daily as needed for mild constipation. 03/27/14   Kelvin Cellar, MD  sulfamethoxazole-trimethoprim (BACTRIM DS,SEPTRA DS) 800-160 MG per tablet Reported on 09/30/2015 08/02/14   Historical Provider, MD  tamsulosin (FLOMAX) 0.4 MG CAPS capsule Take 0.4 mg by mouth 2 (two) times daily.    Historical Provider, MD  trimethoprim (TRIMPEX) 100 MG tablet  08/02/14   Historical Provider, MD   BP 159/91 mmHg  Pulse 73  Resp 19   SpO2 98%   Physical Exam  Constitutional: She is oriented to person, place, and time. She appears well-developed and well-nourished. No distress.  HENT:  Head: Normocephalic and atraumatic.  Nose: Nose normal.  Mouth/Throat: Oropharynx is clear and moist. No oropharyngeal exudate.  Eyes: Conjunctivae and EOM are normal. Pupils are equal, round, and reactive to light. No scleral icterus.  Neck: Normal range of motion. Neck supple. No JVD present. No tracheal deviation present. No thyromegaly present.  Cardiovascular: Normal rate, regular rhythm and normal heart sounds.  Exam reveals no gallop and no friction rub.   No murmur heard. Pulmonary/Chest: Effort normal and breath sounds normal. No respiratory distress. She has no wheezes. She exhibits no tenderness.  Abdominal: Soft. Bowel sounds are normal. She exhibits no distension and no mass. There is no tenderness. There is no rebound and no guarding.  Musculoskeletal: Normal range of motion. She exhibits no edema or tenderness.  Lymphadenopathy:    She has no cervical adenopathy.  Neurological: She is alert and oriented to person, place, and time.  Left upper extremity has 3/5 strength. 5/5 strength and sensation in all other extremities.  Normal cerebellar testing.   Skin: Skin is warm and dry. No rash noted. No erythema. No pallor.  Nursing note and vitals reviewed.   ED Course  Procedures (including critical care time)  DIAGNOSTIC STUDIES: Oxygen Saturation is 98% on RA, normal by my interpretation.    COORDINATION OF CARE: 12:44 AM-Discussed treatment plan which includes CT Head with pt at bedside and pt agreed to plan.   12:54 AM-Discussed case with Neurologist, Dr. Nicole Kindred who will come and evaluated pt to determine whether pt has radial neuropathy or new onset stroke.   1:23 AM-Dr. Nicole Kindred came down to see pt. Pt told Dr. Nicole Kindred that she has been having the left hand weakness for the past week prior to being evaluated at  [facility] and diagnosed with a stroke. She continues to have the weakness. He also noticed face weakness and slurred speech. He suggested to order an MRI Brain without contrast to see if anything new happened tonight .  Labs Review Labs Reviewed  CBC - Abnormal; Notable for the following:    RBC 3.84 (*)    Hemoglobin 11.7 (*)    HCT 34.7 (*)    All other components within normal limits  COMPREHENSIVE METABOLIC PANEL - Abnormal; Notable for the following:    CO2 20 (*)    Glucose, Bld 118 (*)    BUN 22 (*)    Total Bilirubin 1.3 (*)    All other components within normal limits  URINALYSIS, ROUTINE W REFLEX MICROSCOPIC (NOT AT [facility]) - Abnormal; Notable for the following:    APPearance CLOUDY (*)    Leukocytes, UA LARGE (*)    All other components within normal limits  URINE MICROSCOPIC-ADD ON - Abnormal; Notable for the following:    Squamous Epithelial / LPF 6-30 (*)    Bacteria, UA MANY (*)    All other components within normal limits  I-STAT CHEM 8, ED - Abnormal; Notable for the following:    BUN 22 (*)    Glucose, Bld 115 (*)    All other components within normal limits  ETHANOL  PROTIME-INR  APTT  DIFFERENTIAL  URINE RAPID DRUG SCREEN, HOSP PERFORMED  Randolm Idol, ED    Imaging Review Mr Brain Wo Contrast  01/19/2016  CLINICAL DATA:  [address] and hit head on toilet tonight. Gradual onset LEFT hand weakness for 1 week. Assess for new stroke. Recent diagnosis of stroke. Off aspirin and Plavix. History of hypertension and hypercholesterolemia. EXAM: MRI HEAD WITHOUT CONTRAST TECHNIQUE: Multiplanar, multiecho pulse sequences of the brain and surrounding structures were obtained without intravenous contrast. COMPARISON:  MRI of the head Jan 13, 2016 FINDINGS: Patchy reduced diffusion RIGHT frontoparietal junction, with a few new punctate foci of reduced diffusion in similar distribution. Low ADC values in confluent area of reduced diffusion associated T2 bright FLAIR signal. No  susceptibility artifact to suggest hemorrhage. Punctate foci of susceptibility artifact in bilateral thalamus, LEFT cerebellum, LEFT dorsal pons can be seen in the setting of chronic hypertension. No midline shift, mass effect or mass lesions. Ventricles and sulci are  normal for patient's age. Patchy to confluent supratentorial and pontine white matter FLAIR T2 hyperintensities. No abnormal extra-axial fluid collections. Major intracranial vascular flow voids present at the skullbase. Attenuated flow voids within RIGHT sylvian fissure. Paranasal sinuses and mastoid air cells are well aerated. Ocular globes and orbital contents are normal. No abnormal sellar expansion. No cerebellar tonsillar ectopia. No suspicious calvarial bone marrow signal. IMPRESSION: Mild local propagation of RIGHT MCA territory acute small infarcts. Attenuated RIGHT middle cerebral artery mid to distal segments would be better characterized on MRA versus CTA HEAD. Involutional changes. Moderate chronic small vessel ischemic disease. Electronically Signed   By: Elon Alas M.D.   On: 01/19/2016 06:23   I have personally reviewed and evaluated these images and lab results as part of my medical decision-making.   EKG Interpretation   Date/Time:  Thursday Jan 19 2016 00:29:23 EDT Ventricular Rate:  72 PR Interval:  205 QRS Duration: 103 QT Interval:  402 QTC Calculation: 440 R Axis:   -45 Text Interpretation:  Sinus rhythm Left anterior fascicular block No  significant change since last tracing Confirmed by Glynn Octave  [phone]) on 01/19/2016 12:36:23 AM      MDM   Final diagnoses:  None    Patient presents to the ED for stroke like symptoms. It is difficult to tell if these symptoms are persistent from her last stroke or if they are a new stroke.  I spoke with Dr. Nicole Kindred who recs to get an MRI for evaluation.    6:38 AM MRI reveals a new acute stroke. I consulted with Dr. Nicole Kindred again who recs to have  the patient admitted for stroke work up.  I have paged [facility] hospitalist for admission.   I personally performed the services described in this documentation, which was scribed in my presence. The recorded information has been reviewed and is accurate.       Everlene Balls, MD 01/19/16 [phone]

## 2016-01-19 NOTE — ED Notes (Signed)
Patient transported to MRI via stretcher.

## 2016-01-19 NOTE — Plan of Care (Signed)
82 year old female with hypertension presents with left-sided facial droop and left upper extremity weakness. Patient's symptoms have been present since Monday 3 days ago. MRI shows stroke and neurology has been consulted and the patient will be admitted for further management of stroke.  [name].

## 2016-01-19 NOTE — Consult Note (Signed)
Admission H&P    Chief Complaint: Persistent weakness of left hand.  HPI: [name] is an 82 y.o. female history of hypertension, hypercholesterolemia, hypothyroidism, and recurrent urinary tract infections and stroke one week ago presenting with persistent weakness of her left hand. She is having difficulty holding objects. She came to the emergency room tonight after a fall in her bathroom. She doesn't know how she fell. She was taken Plavix prior to her stroke, which was stopped at discharge for reasons that are unclear. Patient does not recall being told she had an intracranial hemorrhage. She had facial weakness as well as speech changes which are no different from when she was admitted for stroke last week. An weakness is no worse. MRI of the brain showed additional new ischemic infarctions involving left MCA territory, compared to findings on MRI dated 01/13/2016.  Past Medical History  Diagnosis Date  . Hypertension   . High cholesterol   . Stroke [facility]) 1990's    "mild", denies residual on 01/28/2014  . Pneumonia     "maybe in my teens"  . Hypothyroidism   . Anemia   . History of blood transfusion     "S/P knee OR"  . Arthritis     "was in my knees"  . Recurrent UTI (urinary tract infection)     Past Surgical History  Procedure Laterality Date  . Muscle biopsy Left 1990's    "knot biopsy from carrying mail bag for years"  . Bladder suspension  2012  . Splenic artery embolization  01/28/2014  . Joint replacement    . Total knee arthroplasty Bilateral 2012  . Vaginal hysterectomy    . Revison of arteriovenous fistula Right 01/31/2014    Procedure: REPAIR PSEUDOANEURYSM;  Surgeon: Serafina Mitchell, MD;  Location: [facility] OR;  Service: Vascular;  Laterality: Right;  Repair of Femoral Pseudoaneurysm.  . Femur im nail Right 03/23/2014    Procedure: INTRAMEDULLARY (IM) NAIL FEMORAL;  Surgeon: Mauri Pole, MD;  Location: [facility] ORS;  Service: Orthopedics;  Laterality: Right;  .  Visceral angiogram N/A 01/28/2014    Procedure: VISCERAL ANGIOGRAM;  Surgeon: Conrad Helena, MD;  Location: [facility] CATH LAB;  Service: Cardiovascular;  Laterality: N/A;  . Embolization N/A 01/28/2014    Procedure: EMBOLIZATION;  Surgeon: Conrad Hinsdale, MD;  Location: [facility] CATH LAB;  Service: Cardiovascular;  Laterality: N/A;  Splenic Artery    Family History  Problem Relation Age of Onset  . Hypertension Son    Social History:  reports that she has never smoked. She has never used smokeless tobacco. She reports that she does not drink alcohol or use illicit drugs.  Allergies:  Allergies  Allergen Reactions  . Aspirin Other (See Comments)    Causes blood in urine    Medications: Preadmission medications were reviewed by me.  ROS: History obtained from the patient  General ROS: negative for - chills, fatigue, fever, night sweats, weight gain or weight loss Psychological ROS: negative for - behavioral disorder, hallucinations, memory difficulties, mood swings or suicidal ideation Ophthalmic ROS: negative for - blurry vision, double vision, eye pain or loss of vision ENT ROS: negative for - epistaxis, nasal discharge, oral lesions, sore throat, tinnitus or vertigo Allergy and Immunology ROS: negative for - hives or itchy/watery eyes Hematological and Lymphatic ROS: negative for - bleeding problems, bruising or swollen lymph nodes Endocrine ROS: negative for - galactorrhea, hair pattern changes, polydipsia/polyuria or temperature intolerance Respiratory ROS: negative for - cough, hemoptysis, shortness  of breath or wheezing Cardiovascular ROS: negative for - chest pain, dyspnea on exertion, edema or irregular heartbeat Gastrointestinal ROS: negative for - abdominal pain, diarrhea, hematemesis, nausea/vomiting or stool incontinence Genito-Urinary ROS: negative for - dysuria, hematuria, incontinence or urinary frequency/urgency Musculoskeletal ROS: negative for - joint swelling or muscular  weakness Neurological ROS: as noted in HPI Dermatological ROS: negative for rash and skin lesion changes  Physical Examination: Blood pressure 147/82, pulse 71, resp. rate 16, SpO2 97 %.  HEENT-  Normocephalic, no lesions, without obvious abnormality.  Normal external eye and conjunctiva.  Normal TM's bilaterally.  Normal auditory canals and external ears. Normal external nose, mucus membranes and septum.  Normal pharynx. Neck supple with no masses, nodes, nodules or enlargement. Cardiovascular - regular rate and rhythm, S1, S2 normal, no murmur, click, rub or gallop Lungs - chest clear, no wheezing, rales, normal symmetric air entry Abdomen - soft, non-tender; bowel sounds normal; no masses,  no organomegaly Extremities - no joint deformities, effusion, or inflammation and no edema  Neurologic Examination: Mental Status: Alert, oriented, in no acute distress.  Speech slightly slurred without evidence of aphasia. Able to follow commands without difficulty. Cranial Nerves: II-Visual fields were normal. III/IV/VI-Pupils were equal and reacted normally to light. Extraocular movements were full and conjugate.    V/VII-no facial numbness; moderate left lower facial weakness. VIII-normal. X-mild dysarthria. XI: trapezius strength/neck flexion strength normal bilaterally XII-midline tongue extension with normal strength. Motor: 5/5 bilaterally with normal tone and bulk Sensory: Normal throughout. Deep Tendon Reflexes: 2+ and symmetric. Plantars: Mute bilaterally Cerebellar: Mild difficulty with finger to nose testing with use of left upper extremity. Carotid auscultation: Normal  No results found for this or any previous visit (from the past 48 hour(s)). No results found.   Assessment: 82 year old lady with multiple risk factors for stroke as well as right cerebral infarction 5 days ago, presenting with additional acute right MCA territory infarctions.  Plan: 1. HgbA1c, fasting lipid  panel 2. CTA  of head and neck with contrast 3. PT consult, OT consult, Speech consult 4. Echocardiogram if not performed during recent hospital stay at [facility] 5. Carotid dopplers 6. Prophylactic therapy-Antiplatelet med: Plavix  7. Risk  Factor modification   C.R. Nicole Kindred, MD [facility] [phone]  01/19/2016, 1:30 AM

## 2016-01-20 ENCOUNTER — Other Ambulatory Visit: Payer: Self-pay | Admitting: Physician Assistant

## 2016-01-20 ENCOUNTER — Inpatient Hospital Stay (HOSPITAL_COMMUNITY): Payer: Medicare Other

## 2016-01-20 DIAGNOSIS — I4891 Unspecified atrial fibrillation: Secondary | ICD-10-CM

## 2016-01-20 DIAGNOSIS — I639 Cerebral infarction, unspecified: Secondary | ICD-10-CM

## 2016-01-20 DIAGNOSIS — I63419 Cerebral infarction due to embolism of unspecified middle cerebral artery: Secondary | ICD-10-CM

## 2016-01-20 DIAGNOSIS — I679 Cerebrovascular disease, unspecified: Secondary | ICD-10-CM | POA: Insufficient documentation

## 2016-01-20 LAB — CBC
HCT: 32.5 % — ABNORMAL LOW (ref 36.0–46.0)
HEMOGLOBIN: 11.1 g/dL — AB (ref 12.0–15.0)
MCH: 31.5 pg (ref 26.0–34.0)
MCHC: 34.2 g/dL (ref 30.0–36.0)
MCV: 92.3 fL (ref 78.0–100.0)
Platelets: 159 10*3/uL (ref 150–400)
RBC: 3.52 MIL/uL — ABNORMAL LOW (ref 3.87–5.11)
RDW: 13.4 % (ref 11.5–15.5)
WBC: 6.2 10*3/uL (ref 4.0–10.5)

## 2016-01-20 LAB — BASIC METABOLIC PANEL
Anion gap: 12 (ref 5–15)
BUN: 16 mg/dL (ref 6–20)
CHLORIDE: 106 mmol/L (ref 101–111)
CO2: 20 mmol/L — ABNORMAL LOW (ref 22–32)
CREATININE: 0.73 mg/dL (ref 0.44–1.00)
Calcium: 9.5 mg/dL (ref 8.9–10.3)
GFR calc Af Amer: >60 mL/min (ref >60)
GFR calc non Af Amer: >60 mL/min (ref >60)
GLUCOSE: 96 mg/dL (ref 65–99)
Potassium: 4.1 mmol/L (ref 3.5–5.1)
SODIUM: 138 mmol/L (ref 135–145)

## 2016-01-20 LAB — GLUCOSE, CAPILLARY
GLUCOSE-CAPILLARY: 97 mg/dL (ref 65–99)
Glucose-Capillary: 127 mg/dL — ABNORMAL HIGH (ref 65–99)
Glucose-Capillary: 104 mg/dL — ABNORMAL HIGH (ref 65–99)

## 2016-01-20 LAB — LIPID PANEL
CHOLESTEROL: 163 mg/dL (ref 0–200)
HDL: 23 mg/dL — ABNORMAL LOW (ref >40)
LDL CALC: 95 mg/dL (ref 0–99)
Total CHOL/HDL Ratio: 7.1 RATIO
Triglycerides: 225 mg/dL — ABNORMAL HIGH (ref <150)
VLDL: 45 mg/dL — AB (ref 0–40)

## 2016-01-20 NOTE — Evaluation (Addendum)
Physical Therapy Evaluation Patient Details Name: [name] MRN: [id number] DOB: 20-Aug-1934 Today's Date: 01/20/2016   History of Present Illness  [name] is an 82 y.o. female history of hypertension, hypercholesterolemia, hypothyroidism, and recurrent urinary tract infections and stroke one week ago presenting with persistent weakness of her left hand. She came to the emergency room tonight after a fall in her bathroom.  MRI of the brain showed additional new ischemic infarctions involving left MCA territory, compared to findings on MRI dated 01/13/2016.Marland Kitchen  Clinical Impression  Patient presents with decreased independence with mobility due to deficits listed in PT problem list.  She will benefit from skilled PT in the acute setting to allow return home with family support after CIR level rehab stay.  Patient eager to work to improve asking what else she could do to help with L hand weakness after walking in hallway with assist.    Follow Up Recommendations CIR    Equipment Recommendations  None recommended by PT    Recommendations for Other Services Rehab consult     Precautions / Restrictions Precautions Precautions: Fall      Mobility  Bed Mobility Overal bed mobility: Needs Assistance Bed Mobility: Supine to Sit     Supine to sit: Supervision;HOB elevated     General bed mobility comments: use of rail; assist for safety  Transfers Overall transfer level: Needs assistance Equipment used: Rolling walker (2 wheeled) Transfers: Sit to/from Stand Sit to Stand: Min assist         General transfer comment: off balance to L and posterior initially in standing; cue for safety with stand to sit  Ambulation/Gait Ambulation/Gait assistance: Min assist Ambulation Distance (Feet): 200 Feet Assistive device: Rolling walker (2 wheeled) Gait Pattern/deviations: Step-through pattern;Step-to pattern;Shuffle;Decreased step length - left;Decreased dorsiflexion -  left     General Gait Details: difficutly managing walker due to L grip weakness, noting imbalance with L LE weakness increased with fatigue and dragging L foot on floor  Stairs            Wheelchair Mobility    Modified Rankin (Stroke Patients Only) Modified Rankin (Stroke Patients Only) Pre-Morbid Rankin Score: No significant disability Modified Rankin: Moderately severe disability     Balance Overall balance assessment: Needs assistance   Sitting balance-Leahy Scale: Good     Standing balance support: Bilateral upper extremity supported Standing balance-Leahy Scale: Poor Standing balance comment: posterior and L with balance initially on standing with RW                             Pertinent Vitals/Pain Pain Assessment: No/denies pain    Home Living Family/patient expects to be discharged to:: Private residence Living Arrangements: Children Available Help at Discharge: Family Type of Home: House Home Access: Ramped entrance     Home Layout: One level Home Equipment: Environmental consultant - 2 wheels;Cane - single point;Wheelchair - manual;Grab bars - toilet;Grab bars - tub/shower;Shower seat - built in      Prior Function Level of Independence: Independent               Hand Dominance   Dominant Hand: Right    Extremity/Trunk Assessment   Upper Extremity Assessment: Defer to OT evaluation (L UE numbness, weak grip, decreased coordination)           Lower Extremity Assessment: LLE deficits/detail   LLE Deficits / Details: AROM WFL, strength grossly WFL, but functional weakness noted  with ambulation   Cervical / Trunk Assessment: Kyphotic  Communication   Communication: Expressive difficulties (dysarthria, L facial droop)  Cognition Arousal/Alertness: Awake/alert Behavior During Therapy: WFL for tasks assessed/performed Overall Cognitive Status: Within Functional Limits for tasks assessed                      General Comments  General comments (skin integrity, edema, etc.): reports son just had back surgery; has a lady who cleaned her house after knee and hip surgeries who can help some with meals    Exercises Other Exercises Other Exercises: Educated in squeezing rolled hand cloth, picking up cards, packets of seasoning from b/s table, touching each finger to her thumb and lifting and lowering L arm with control for activities for L UE weakness.      Assessment/Plan    PT Assessment Patient needs continued PT services  PT Diagnosis Abnormality of gait;Generalized weakness;Hemiplegia non-dominant side   PT Problem List Decreased strength;Decreased balance;Decreased mobility;Decreased knowledge of use of DME;Decreased safety awareness;Decreased knowledge of precautions;Decreased coordination;Impaired sensation  PT Treatment Interventions DME instruction;Gait training;Patient/family education;Functional mobility training;Therapeutic activities;Therapeutic exercise;Balance training   PT Goals (Current goals can be found in the Care Plan section) Acute Rehab PT Goals Patient Stated Goal: To go to rehab PT Goal Formulation: With patient Time For Goal Achievement: 01/27/16 Potential to Achieve Goals: Good    Frequency Min 4X/week   Barriers to discharge        Co-evaluation               End of Session Equipment Utilized During Treatment: Gait belt Activity Tolerance: Patient tolerated treatment well Patient left: in bed;with call bell/phone within reach;with bed alarm set           Time: 1355-1415 PT Time Calculation (min) (ACUTE ONLY): 20 min   Charges:   PT Evaluation $PT Eval Moderate Complexity: 1 Procedure     PT G CodesElray Woodard 2016-01-24, 2:35 PM  [name], PT [phone] 01/24/2016

## 2016-01-20 NOTE — Consult Note (Signed)
[facility] CM Inpatient Consult   01/20/2016  [name] 1933/11/19 [id number] Patient screened for potential [facility] Care Management services. Patient is eligible for [facility] Care [facility] Services. Electronic medical record reveals patient's discharge plan is for inpatient rehab facility. [facility] Care Management services not appropriate at this time and can continue to follow for post facility needs as appropriate. If patient's post hospital needs change please place a [facility] Care Management consult. For questions please contact:   Charlesetta Shanks, RN BSN CCM [facility]  [phone] business mobile phone Toll free office [phone]

## 2016-01-20 NOTE — Progress Notes (Signed)
STROKE TEAM PROGRESS NOTE   SUBJECTIVE (INTERVAL HISTORY) No family members present. I explained that we will schedule a 30 day cardiac monitor after discharge. I also recommended that she keep her systolic blood pressure between 130 and 150 and avoid hypotension as well as well-hydrated.   OBJECTIVE Temp:  [98.3 F (36.8 C)-98.5 F (36.9 C)] 98.5 F (36.9 C) (05/12 0619) Pulse Rate:  [66-79] 66 (05/12 0619) Cardiac Rhythm:  [-] Heart block (05/12 0702) Resp:  [18] 18 (05/12 0619) BP: (126-177)/(71-110) 156/71 mmHg (05/12 0619) SpO2:  [92 %-98 %] 97 % (05/12 0619)  CBC:  Recent Labs Lab 01/19/16 0207  01/19/16 0846 01/20/16 0605  WBC 7.2  --  6.6 6.2  NEUTROABS 4.3  --   --   --   HGB 11.7*  < > 12.0 11.1*  HCT 34.7*  < > 34.4* 32.5*  MCV 90.4  --  92.0 92.3  PLT 167  --  155 159  < > = values in this interval not displayed.  Basic Metabolic Panel:   Recent Labs Lab 01/19/16 0207 01/19/16 0212 01/19/16 0846 01/20/16 0605  NA 137 137  --  138  K 4.5 4.4  --  4.1  CL 106 106  --  106  CO2 20*  --   --  20*  GLUCOSE 118* 115*  --  96  BUN 22* 22*  --  16  CREATININE 0.79 0.80 0.78 0.73  CALCIUM 10.1  --   --  9.5    Lipid Panel:     Component Value Date/Time   CHOL 163 01/20/2016 0605   TRIG 225* 01/20/2016 0605   HDL 23* 01/20/2016 0605   CHOLHDL 7.1 01/20/2016 0605   VLDL 45* 01/20/2016 0605   LDLCALC 95 01/20/2016 0605   HgbA1c: No results found for: HGBA1C Urine Drug Screen:     Component Value Date/Time   LABOPIA NONE DETECTED 01/19/2016 0415   COCAINSCRNUR NONE DETECTED 01/19/2016 0415   LABBENZ NONE DETECTED 01/19/2016 0415   AMPHETMU NONE DETECTED 01/19/2016 0415   THCU NONE DETECTED 01/19/2016 0415   LABBARB NONE DETECTED 01/19/2016 0415      IMAGING  Dg Chest 2 View 01/19/2016   Stable chronic interstitial lung disease and COPD. Borderline heart size.   Mr Brain Wo Contrast 01/19/2016   Mild local propagation of RIGHT MCA territory  acute small infarcts. Attenuated RIGHT middle cerebral artery mid to distal segments would be better characterized on MRA versus CTA HEAD. Involutional changes. Moderate chronic small vessel ischemic disease.   MRA  01/20/2016 1. Positive for Posterior right MCA M2 branch occlusion 8 mm beyond its origin. 2. No more proximal right anterior circulation lesion. 3. Intracranial atherosclerosis with moderate or severe stenoses: - Left vertebral artery distal V4 segment, - Bilateral PCA P2 segments worse on the left, and - Left ACA A1 segment.  Carotid Doppler  minimal amount of bilateral atherosclerotic plaque with no significant hemodynamic stenosis,   2-D echo  without evidence of thrombus showing EF of 60-65% and mild concentric left ventricular hypertrophy.   PHYSICAL EXAM  Temp:  [98.3 F (36.8 C)-98.5 F (36.9 C)] 98.5 F (36.9 C) (05/12 0619) Pulse Rate:  [66-79] 66 (05/12 0619) Resp:  [18] 18 (05/12 0619) BP: (126-177)/(71-110) 156/71 mmHg (05/12 0619) SpO2:  [92 %-98 %] 97 % (05/12 0619)  General - Well nourished, well developed, in no apparent distress.  Ophthalmologic - Fundi not visualized due to noncooperation.  Cardiovascular - Regular  rate and rhythm with no murmur.  Mental Status -  Level of arousal and orientation to time, place, and person were intact. Language including expression, naming, repetition, comprehension was assessed and found intact. Fund of Knowledge was assessed and was intact.  Cranial Nerves II - XII - II - Visual field intact OU. III, IV, VI - Extraocular movements intact. V - Facial sensation intact bilaterally. VII - left facial droop. VIII - Hearing & vestibular intact bilaterally. X - Palate elevates symmetrically, mild dysarthria. XI - Chin turning & shoulder shrug intact bilaterally. XII - Tongue protrusion to the left.  Motor Strength - The patient's strength was normal in all extremities except LUE 3/5 proximal and 4/5 distally  and pronator drift was absent.  Bulk was normal and fasciculations were absent.   Motor Tone - Muscle tone was assessed at the neck and appendages and was normal.  Reflexes - The patient's reflexes were 1+ in all extremities and she had no pathological reflexes.  Sensory - Light touch, temperature/pinprick were assessed and were symmetrical.    Coordination - The patient had normal movements in the right hand with no ataxia or dysmetria.  Tremor was absent.  Gait and Station - not tested due to safety concerns   ASSESSMENT/PLAN [name] is a 82 y.o. female with history of hypertension, hypercholesterolemia, hypothyroidism, and recurrent urinary tract infections and stroke one week presenting with persistent left hand weakness. She did not receive IV t-PA.   Stroke:  Non-dominant right CR and SO infarcts in same area as infarct 1 week ago with slight extension,  secondary to small vessel disease source. But due to left M2 occlusion, would recommend 30 day monitoring.  Resultant  L hand weakness and left facial droop  MRI  Right CR and SO infarcts with slight extension (in same area as infarcts one week ago)  MRA - left M2 occlusion and diffuse cerebrovascular disease.  Carotid Doppler at [name] last week  No significant stenosis   2D Echo  at [name] last week  ER 60-65% No source of embolus   LDL at [name] last week  105  Recommend 30 day cardiac event monitoring to rule out afib  HgbA1c at [name] last week   5.3  Heparin 5000 units sq tid for VTE prophylaxis Diet Heart Room service appropriate?: Yes; Fluid consistency:: Thin  No antithrombotic prior to admission, though per [name] she was discharged on Plavix 75 mg daily, now on aspirin 325 mg daily and clopidogrel 75 mg daily. Recommend to dual antiplatelet due to significant intracranial stenosis. DAPT for 3 months and then plavix alone.  Patient counseled to be compliant with her antithrombotic  medications  Ongoing aggressive stroke risk factor management  Therapy recommendations:  CIR  Disposition:  pending   Hypertension  Stable  Permissive hypertension (OK if < 220/120) but gradually normalize in 5-7 days  Avoid hypotension due to left M2 occlusion  Systolic blood pressure goal 130 to 150  Stay well hydrated  Hyperlipidemia  Home meds:  zetia  LDL 105, goal < 70  Now On pravachol 20   Continue statin and zetia at discharge  Other Stroke Risk Factors  Advanced age  Hx stroke/TIA  01/2016 R MCA infarct   Other Active Problems  UTI - on rocephin  Hypothyroidism   Hospital day # 1  Neurology will sign off. Please call with questions. Pt will follow up with carolyn martin NP at [facility] in about 2 months. Thanks  for the consult.  Rosalin Hawking, MD PhD Stroke Neurology 01/20/2016 4:30 PM    To contact Stroke Continuity provider, please refer to [email or web address] After hours, contact General Neurology

## 2016-01-20 NOTE — Progress Notes (Signed)
Inpatient Rehabilitation  Per PT request, patient was screened by Gunnar Fusi for appropriateness for an Inpatient Acute Rehab consult.  At this time we are recommending an Inpatient Rehab consult.  Dr. Sloan Leiter was paged to request an order.  Please order if you are agreeable.    Carmelia Roller., CCC/SLP Admission Coordinator  [place]  Cell [phone]

## 2016-01-20 NOTE — Progress Notes (Signed)
PROGRESS NOTE        PATIENT DETAILS Name: [name] Age: 82 y.o. Sex: female Date of Birth: April 02, 1934 Admit Date: 01/19/2016 Admitting Physician Eduard Clos, MD MWU:[id number] E, MD Outpatient Specialists: Leonides Sake  Brief Narrative: Patient is a 82 y.o. female with past medical history of hypertension, dyslipidemia, hypothyroidism who was just discharged from [facility] on 5/5 after workup for CVA, brought in for worsening left arm weakness and slurred speech.  Subjective: No dysarthria this morning. Left arm is essentially unchanged.  Assessment/Plan: Active Problems: Acute right MCA territory infarcts with extension: Continues to have left upper extremity weakness approximately 3/5. Recent 2-D echo and carotid Doppler without any significant abnormalities. MRA shows significant intracranial atherosclerotic disease, would require dual antiplatelets with aspirin and Plavix for 3 months, and then Plavix alone. Neurology recommends a 30 day event monitor, have spoken with cardiology to see if this can be arranged. A1c 5.3, LDL 95. Await physical therapy evaluation  UTI: Continue IV Rocephin, await urine cultures.  HTN (hypertension): No permissive hypertension-would avoid aggressive control of blood pressure in the setting of extension of CVA. Blood pressure seems to be reasonably well-controlled-would continue with lisinopril and labetalol-we'll discontinue amlodipine and follow.  Dyslipidemia: LDL 95-continue with pravastatin intensity  Hypothyroidism: Continue levothyroxine  DVT Prophylaxis: Prophylactic Heparin   Code Status: Full code   Family Communication: None at bedside  Disposition Plan: Remain inpatient-home 5/13-depending on PT eval  Antimicrobial agents: IV Rocephin 5/11>>  Procedures: None  CONSULTS:  neurology  Time spent: 25 minutes-Greater than 50% of this time was spent in counseling,  explanation of diagnosis, planning of further management, and coordination of care.  MEDICATIONS: Anti-infectives    Start     Dose/Rate Route Frequency Ordered Stop   01/19/16 1015  cefTRIAXone (ROCEPHIN) 2 g in dextrose 5 % 50 mL IVPB     2 g 100 mL/hr over 30 Minutes Intravenous Every 24 hours 01/19/16 0903        Scheduled Meds: . amLODipine  5 mg Oral QHS  . aspirin  300 mg Rectal Daily   Or  . aspirin  325 mg Oral Daily  . cefTRIAXone (ROCEPHIN)  IV  2 g Intravenous Q24H  . clopidogrel  75 mg Oral Daily  . ezetimibe  10 mg Oral QHS  . heparin  5,000 Units Subcutaneous Q8H  . labetalol  300 mg Oral BID  . levothyroxine  50 mcg Oral QAC breakfast  . lisinopril  40 mg Oral QHS  . pravastatin  20 mg Oral Daily  . sodium chloride flush  3 mL Intravenous Q12H   Continuous Infusions: . sodium chloride 75 mL/hr at 01/19/16 1000   PRN Meds:.acetaminophen **OR** acetaminophen, lactose free nutrition, ondansetron **OR** ondansetron (ZOFRAN) IV, oxyCODONE   PHYSICAL EXAM: Vital signs: Filed Vitals:   01/19/16 2206 01/20/16 0153 01/20/16 0157 01/20/16 0619  BP: 177/110 126/106 151/73 156/71  Pulse: 72 79 75 66  Temp: 98.4 F (36.9 C) 98.3 F (36.8 C) 98.4 F (36.9 C) 98.5 F (36.9 C)  TempSrc: Oral Oral Oral Oral  Resp: 18 18 18 18   SpO2: 98% 92% 94% 97%   There were no vitals filed for this visit. There is no weight on file to calculate BMI.   Gen Exam: Awake and alert with clear speech. Not in any distress Neck:  Supple, No JVD.   Chest: B/L Clear.   CVS: S1 S2 Regular, no murmurs.  Abdomen: soft, BS +, non tender, non distended.  Extremities: no edema, lower extremities warm to touch. Neurologic: LUE 3/5,LLE 4/5  Skin: No Rash or lesions   Wounds: N/A.   LABORATORY DATA: CBC:  Recent Labs Lab 01/19/16 0207 01/19/16 0212 01/19/16 0846 01/20/16 0605  WBC 7.2  --  6.6 6.2  NEUTROABS 4.3  --   --   --   HGB 11.7* 12.2 12.0 11.1*  HCT 34.7* 36.0 34.4*  32.5*  MCV 90.4  --  92.0 92.3  PLT 167  --  155 159    Basic Metabolic Panel:  Recent Labs Lab 01/19/16 0207 01/19/16 0212 01/19/16 0846 01/20/16 0605  NA 137 137  --  138  K 4.5 4.4  --  4.1  CL 106 106  --  106  CO2 20*  --   --  20*  GLUCOSE 118* 115*  --  96  BUN 22* 22*  --  16  CREATININE 0.79 0.80 0.78 0.73  CALCIUM 10.1  --   --  9.5    GFR: CrCl cannot be calculated (Unknown ideal weight.).  Liver Function Tests:  Recent Labs Lab 01/19/16 0207  AST 41  ALT 45  ALKPHOS 76  BILITOT 1.3*  PROT 6.5  ALBUMIN 4.3   No results for input(s): LIPASE, AMYLASE in the last 168 hours. No results for input(s): AMMONIA in the last 168 hours.  Coagulation Profile:  Recent Labs Lab 01/19/16 0207  INR 1.16    Cardiac Enzymes: No results for input(s): CKTOTAL, CKMB, CKMBINDEX, TROPONINI in the last 168 hours.  BNP (last 3 results) No results for input(s): PROBNP in the last 8760 hours.  HbA1C: No results for input(s): HGBA1C in the last 72 hours.  CBG:  Recent Labs Lab 01/19/16 1132 01/19/16 1617 01/19/16 2205 01/20/16 0649 01/20/16 1111  GLUCAP 137* 119* 110* 97 127*    Lipid Profile:  Recent Labs  01/20/16 0605  CHOL 163  HDL 23*  LDLCALC 95  TRIG 308*  CHOLHDL 7.1    Thyroid Function Tests: No results for input(s): TSH, T4TOTAL, FREET4, T3FREE, THYROIDAB in the last 72 hours.  Anemia Panel: No results for input(s): VITAMINB12, FOLATE, FERRITIN, TIBC, IRON, RETICCTPCT in the last 72 hours.  Urine analysis:    Component Value Date/Time   COLORURINE YELLOW 01/19/2016 0415   APPEARANCEUR CLOUDY* 01/19/2016 0415   LABSPEC 1.009 01/19/2016 0415   PHURINE 6.0 01/19/2016 0415   GLUCOSEU NEGATIVE 01/19/2016 0415   HGBUR NEGATIVE 01/19/2016 0415   BILIRUBINUR NEGATIVE 01/19/2016 0415   KETONESUR NEGATIVE 01/19/2016 0415   PROTEINUR NEGATIVE 01/19/2016 0415   UROBILINOGEN 0.2 03/23/2014 0403   NITRITE NEGATIVE 01/19/2016 0415    LEUKOCYTESUR LARGE* 01/19/2016 0415    Sepsis Labs: Lactic Acid, Venous No results found for: LATICACIDVEN  MICROBIOLOGY: No results found for this or any previous visit (from the past 240 hour(s)).  RADIOLOGY STUDIES/RESULTS: Dg Chest 2 View  01/19/2016  CLINICAL DATA:  Stroke last night.  Weakness. EXAM: CHEST  2 VIEW COMPARISON:  03/23/2014 FINDINGS: Diffuse interstitial prominence throughout the lungs, unchanged, likely chronic interstitial lung disease. Mild emphysema/COPD. Heart is borderline in size. No confluent opacities or effusions. No real change since prior study. IMPRESSION: Stable chronic interstitial lung disease and COPD. Borderline heart size. Electronically Signed   By: Charlett Nose M.D.   On: 01/19/2016 11:58   Mr  Brain Wo Contrast  01/19/2016  CLINICAL DATA:  Larey Seat and hit head on toilet tonight. Gradual onset LEFT hand weakness for 1 week. Assess for new stroke. Recent diagnosis of stroke. Off aspirin and Plavix. History of hypertension and hypercholesterolemia. EXAM: MRI HEAD WITHOUT CONTRAST TECHNIQUE: Multiplanar, multiecho pulse sequences of the brain and surrounding structures were obtained without intravenous contrast. COMPARISON:  MRI of the head Jan 13, 2016 FINDINGS: Patchy reduced diffusion RIGHT frontoparietal junction, with a few new punctate foci of reduced diffusion in similar distribution. Low ADC values in confluent area of reduced diffusion associated T2 bright FLAIR signal. No susceptibility artifact to suggest hemorrhage. Punctate foci of susceptibility artifact in bilateral thalamus, LEFT cerebellum, LEFT dorsal pons can be seen in the setting of chronic hypertension. No midline shift, mass effect or mass lesions. Ventricles and sulci are normal for patient's age. Patchy to confluent supratentorial and pontine white matter FLAIR T2 hyperintensities. No abnormal extra-axial fluid collections. Major intracranial vascular flow voids present at the skullbase.  Attenuated flow voids within RIGHT sylvian fissure. Paranasal sinuses and mastoid air cells are well aerated. Ocular globes and orbital contents are normal. No abnormal sellar expansion. No cerebellar tonsillar ectopia. No suspicious calvarial bone marrow signal. IMPRESSION: Mild local propagation of RIGHT MCA territory acute small infarcts. Attenuated RIGHT middle cerebral artery mid to distal segments would be better characterized on MRA versus CTA HEAD. Involutional changes. Moderate chronic small vessel ischemic disease. Electronically Signed   By: Awilda Metro M.D.   On: 01/19/2016 06:23   Mr Palma Holter  01/20/2016  ADDENDUM REPORT: 01/20/2016 09:03 ADDENDUM: Study discussed by telephone with Neurology Dr. Marvel Plan, on 01/20/2016 at 0855 hours Electronically Signed   By: Odessa Fleming M.D.   On: 01/20/2016 09:03  01/20/2016  CLINICAL DATA:  82 year old female status post right MCA infarct with mild extension since 01/13/2016. Initial encounter. EXAM: MRA HEAD WITHOUT CONTRAST TECHNIQUE: Angiographic images of the Circle of Willis were obtained using MRA technique without intravenous contrast. COMPARISON:  Brain MRI 01/19/2016, 01/13/2016, and earlier FINDINGS: No intracranial mass effect or ventriculomegaly. Antegrade flow in the posterior circulation with irregular and stenotic distal left vertebral artery. Severe left V4 stenosis occurs just proximal to the vertebrobasilar junction (series 305, image 10). Negative distal right vertebral artery. Both PICA origins remain patent. Basilar artery is patent with mild irregularity, no significant stenosis. SCA origins are normal. Fetal type bilateral PCA origins. Moderate to severe bilateral P 2 segment PCA stenosis worse on the left (series 306, image 13). Preserved distal PCA flow. Antegrade flow in the distal cervical ICAs, the right is tortuous. Antegrade flow in both ICA siphons. No siphon stenosis. Ophthalmic and posterior communicating artery origins are  normal. Patent carotid termini. Normal ACA origins. Moderate to severe left ACA A1 stenosis (series 304, image 10). Diminutive anterior communicating artery. Visualized ACA branches are within normal limits. Left MCA origin, M1 segment, bifurcation, and left MCA branches are within normal limits. Right MCA origin and M1 segment are normal. The right MCA bifurcation is patent, but the posterior sylvian M2 branches occluded about 8 mm beyond its origin. IMPRESSION: 1. Positive for Posterior right MCA M2 branch occlusion 8 mm beyond its origin. 2. No more proximal right anterior circulation lesion. 3. Intracranial atherosclerosis with moderate or severe stenoses: - Left vertebral artery distal V4 segment, - Bilateral PCA P2 segments worse on the left, and - Left ACA A1 segment. Electronically Signed: By: Odessa Fleming M.D. On: 01/20/2016 08:48  LOS: 1 day   Jeoffrey Massed, MD  [facility] Pager:[phone]  If 7PM-7AM, please contact night-coverage [email or web address] Password [facility] 01/20/2016, 12:43 PM

## 2016-01-21 LAB — HEMOGLOBIN A1C
Hgb A1c MFr Bld: 5.5 % (ref 4.8–5.6)
MEAN PLASMA GLUCOSE: 111 mg/dL

## 2016-01-21 LAB — GLUCOSE, CAPILLARY
GLUCOSE-CAPILLARY: 103 mg/dL — AB (ref 65–99)
Glucose-Capillary: 98 mg/dL (ref 65–99)

## 2016-01-21 MED ORDER — GI COCKTAIL ~~LOC~~
30.0000 mL | Freq: Once | ORAL | Status: DC
  Filled 2016-01-21: qty 30

## 2016-01-21 MED ORDER — LABETALOL HCL 200 MG PO TABS
300.0000 mg | ORAL_TABLET | Freq: Two times a day (BID) | ORAL | Status: DC
  Administered 2016-01-21 – 2016-01-23 (×4): 300 mg via ORAL
  Filled 2016-01-21 (×4): qty 1

## 2016-01-21 NOTE — Progress Notes (Signed)
PROGRESS NOTE        PATIENT DETAILS Name: [name] Age: 82 y.o. Sex: female Date of Birth: [birth year] Admit Date: 01/19/2016 Admitting Physician Eduard Clos, MD OZH:[id number],ONGEXBM E, MD Outpatient Specialists: Leonides Sake  Brief Narrative: Patient is a 82 y.o. female with past medical history of hypertension, dyslipidemia, hypothyroidism who was just discharged from [facility] on 5/5 after workup for CVA, brought in for worsening left arm weakness and slurred speech. Further evaluation revealed extension of recent infarct and a UTI.  Subjective: No dysarthria this morning. Left arm strength has improved this morning. Feels overall better  Assessment/Plan: Active Problems: Acute right MCA territory infarcts with extension: Left upper extremity strength has improved, approximately 4/5 this a.m.  Recent 2-D echo and carotid Doppler done at [facility]- without any significant abnormalities. MRA shows significant intracranial atherosclerotic disease, recommendations from radiology are to continue dual antiplatelets with aspirin and Plavix for 3 months, and then Plavix alone. Neurology recommends a 30 day event monitor, have spoken with cardiology to see if this can be arranged. A1c 5.3, LDL 95. Seen by physical therapy, recommendations are for CIR-have consulted CIR-awaiting evaluation. I've also spoken with social work to begin discharge planning and case no beds available at [facility].   UTI: Continue IV Rocephin, await urine cultures.  HTN (hypertension): Allow permissive hypertension-would avoid aggressive control of blood pressure in the setting of extension of CVA. Blood pressure seems to be reasonably well-controlled-would continue with lisinopril and labetalol-amlodipine remains on hold. Follow.  Dyslipidemia: LDL 95-continue with pravastatin and zetia  Hypothyroidism: Continue levothyroxine  DVT Prophylaxis: Prophylactic Heparin   Code  Status: Full code   Family Communication: None at bedside  Disposition Plan: Remain inpatient-home 5/13-depending on PT eval  Antimicrobial agents: IV Rocephin 5/11>>  Procedures: None  CONSULTS:  neurology  Time spent: 25 minutes-Greater than 50% of this time was spent in counseling, explanation of diagnosis, planning of further management, and coordination of care.  MEDICATIONS: Anti-infectives    Start     Dose/Rate Route Frequency Ordered Stop   01/19/16 1015  cefTRIAXone (ROCEPHIN) 2 g in dextrose 5 % 50 mL IVPB     2 g 100 mL/hr over 30 Minutes Intravenous Every 24 hours 01/19/16 0903        Scheduled Meds: . aspirin  300 mg Rectal Daily   Or  . aspirin  325 mg Oral Daily  . cefTRIAXone (ROCEPHIN)  IV  2 g Intravenous Q24H  . clopidogrel  75 mg Oral Daily  . ezetimibe  10 mg Oral QHS  . gi cocktail  30 mL Oral Once  . heparin  5,000 Units Subcutaneous Q8H  . labetalol  300 mg Oral BID  . levothyroxine  50 mcg Oral QAC breakfast  . lisinopril  40 mg Oral QHS  . pravastatin  20 mg Oral Daily  . sodium chloride flush  3 mL Intravenous Q12H   Continuous Infusions:   PRN Meds:.acetaminophen **OR** acetaminophen, lactose free nutrition, ondansetron **OR** ondansetron (ZOFRAN) IV, oxyCODONE   PHYSICAL EXAM: Vital signs: Filed Vitals:   01/21/16 0243 01/21/16 0700 01/21/16 1019 01/21/16 1453  BP: 167/73 105/84 151/70 160/92  Pulse: 71 70 70 60  Temp: 97.4 F (36.3 C) 98.6 F (37 C) 98.2 F (36.8 C) 97.8 F (36.6 C)  TempSrc: Oral Oral Oral Oral  Resp: 20  18 20 20   SpO2: 96% 97% 100% 98%   There were no vitals filed for this visit. There is no weight on file to calculate BMI.   Gen Exam: Awake and alert with clear speech. Not in any distress Neck: Supple, No JVD.   Chest: B/L Clear.   CVS: S1 S2 Regular, no murmurs.  Abdomen: soft, BS +, non tender, non distended.  Extremities: no edema, lower extremities warm to touch. Neurologic: LUE 4/5,LLE  4/5  Skin: No Rash or lesions   Wounds: N/A.   LABORATORY DATA: CBC:  Recent Labs Lab 01/19/16 0207 01/19/16 0212 01/19/16 0846 01/20/16 0605  WBC 7.2  --  6.6 6.2  NEUTROABS 4.3  --   --   --   HGB 11.7* 12.2 12.0 11.1*  HCT 34.7* 36.0 34.4* 32.5*  MCV 90.4  --  92.0 92.3  PLT 167  --  155 159    Basic Metabolic Panel:  Recent Labs Lab 01/19/16 0207 01/19/16 0212 01/19/16 0846 01/20/16 0605  NA 137 137  --  138  K 4.5 4.4  --  4.1  CL 106 106  --  106  CO2 20*  --   --  20*  GLUCOSE 118* 115*  --  96  BUN 22* 22*  --  16  CREATININE 0.79 0.80 0.78 0.73  CALCIUM 10.1  --   --  9.5    GFR: CrCl cannot be calculated (Unknown ideal weight.).  Liver Function Tests:  Recent Labs Lab 01/19/16 0207  AST 41  ALT 45  ALKPHOS 76  BILITOT 1.3*  PROT 6.5  ALBUMIN 4.3   No results for input(s): LIPASE, AMYLASE in the last 168 hours. No results for input(s): AMMONIA in the last 168 hours.  Coagulation Profile:  Recent Labs Lab 01/19/16 0207  INR 1.16    Cardiac Enzymes: No results for input(s): CKTOTAL, CKMB, CKMBINDEX, TROPONINI in the last 168 hours.  BNP (last 3 results) No results for input(s): PROBNP in the last 8760 hours.  HbA1C:  Recent Labs  01/20/16 0605  HGBA1C 5.5    CBG:  Recent Labs Lab 01/20/16 0649 01/20/16 1111 01/20/16 1657 01/21/16 0703 01/21/16 1130  GLUCAP 97 127* 104* 103* 98    Lipid Profile:  Recent Labs  01/20/16 0605  CHOL 163  HDL 23*  LDLCALC 95  TRIG 644*  CHOLHDL 7.1    Thyroid Function Tests: No results for input(s): TSH, T4TOTAL, FREET4, T3FREE, THYROIDAB in the last 72 hours.  Anemia Panel: No results for input(s): VITAMINB12, FOLATE, FERRITIN, TIBC, IRON, RETICCTPCT in the last 72 hours.  Urine analysis:    Component Value Date/Time   COLORURINE YELLOW 01/19/2016 0415   APPEARANCEUR CLOUDY* 01/19/2016 0415   LABSPEC 1.009 01/19/2016 0415   PHURINE 6.0 01/19/2016 0415   GLUCOSEU  NEGATIVE 01/19/2016 0415   HGBUR NEGATIVE 01/19/2016 0415   BILIRUBINUR NEGATIVE 01/19/2016 0415   KETONESUR NEGATIVE 01/19/2016 0415   PROTEINUR NEGATIVE 01/19/2016 0415   UROBILINOGEN 0.2 03/23/2014 0403   NITRITE NEGATIVE 01/19/2016 0415   LEUKOCYTESUR LARGE* 01/19/2016 0415    Sepsis Labs: Lactic Acid, Venous No results found for: LATICACIDVEN  MICROBIOLOGY: Recent Results (from the past 240 hour(s))  Culture, Urine     Status: Abnormal (Preliminary result)   Collection Time: 01/19/16  4:15 AM  Result Value Ref Range Status   Specimen Description URINE, CLEAN CATCH  Final   Special Requests NONE  Final   Culture (A)  Final    >=  100,000 COLONIES/mL GRAM NEGATIVE RODS IDENTIFICATION AND SUSCEPTIBILITIES TO FOLLOW    Report Status PENDING  Incomplete    RADIOLOGY STUDIES/RESULTS: Dg Chest 2 View  01/19/2016  CLINICAL DATA:  Stroke last night.  Weakness. EXAM: CHEST  2 VIEW COMPARISON:  03/23/2014 FINDINGS: Diffuse interstitial prominence throughout the lungs, unchanged, likely chronic interstitial lung disease. Mild emphysema/COPD. Heart is borderline in size. No confluent opacities or effusions. No real change since prior study. IMPRESSION: Stable chronic interstitial lung disease and COPD. Borderline heart size. Electronically Signed   By: Charlett Nose M.D.   On: 01/19/2016 11:58   Mr Brain Wo Contrast  01/19/2016  CLINICAL DATA:  Larey Seat and hit head on toilet tonight. Gradual onset LEFT hand weakness for 1 week. Assess for new stroke. Recent diagnosis of stroke. Off aspirin and Plavix. History of hypertension and hypercholesterolemia. EXAM: MRI HEAD WITHOUT CONTRAST TECHNIQUE: Multiplanar, multiecho pulse sequences of the brain and surrounding structures were obtained without intravenous contrast. COMPARISON:  MRI of the head Jan 13, 2016 FINDINGS: Patchy reduced diffusion RIGHT frontoparietal junction, with a few new punctate foci of reduced diffusion in similar distribution. Low  ADC values in confluent area of reduced diffusion associated T2 bright FLAIR signal. No susceptibility artifact to suggest hemorrhage. Punctate foci of susceptibility artifact in bilateral thalamus, LEFT cerebellum, LEFT dorsal pons can be seen in the setting of chronic hypertension. No midline shift, mass effect or mass lesions. Ventricles and sulci are normal for patient's age. Patchy to confluent supratentorial and pontine white matter FLAIR T2 hyperintensities. No abnormal extra-axial fluid collections. Major intracranial vascular flow voids present at the skullbase. Attenuated flow voids within RIGHT sylvian fissure. Paranasal sinuses and mastoid air cells are well aerated. Ocular globes and orbital contents are normal. No abnormal sellar expansion. No cerebellar tonsillar ectopia. No suspicious calvarial bone marrow signal. IMPRESSION: Mild local propagation of RIGHT MCA territory acute small infarcts. Attenuated RIGHT middle cerebral artery mid to distal segments would be better characterized on MRA versus CTA HEAD. Involutional changes. Moderate chronic small vessel ischemic disease. Electronically Signed   By: Awilda Metro M.D.   On: 01/19/2016 06:23   Mr Palma Holter  01/20/2016  ADDENDUM REPORT: 01/20/2016 09:03 ADDENDUM: Study discussed by telephone with Neurology Dr. Marvel Plan, on 01/20/2016 at 0855 hours Electronically Signed   By: Odessa Fleming M.D.   On: 01/20/2016 09:03  01/20/2016  CLINICAL DATA:  82 year old female status post right MCA infarct with mild extension since 01/13/2016. Initial encounter. EXAM: MRA HEAD WITHOUT CONTRAST TECHNIQUE: Angiographic images of the Circle of Willis were obtained using MRA technique without intravenous contrast. COMPARISON:  Brain MRI 01/19/2016, 01/13/2016, and earlier FINDINGS: No intracranial mass effect or ventriculomegaly. Antegrade flow in the posterior circulation with irregular and stenotic distal left vertebral artery. Severe left V4 stenosis occurs  just proximal to the vertebrobasilar junction (series 305, image 10). Negative distal right vertebral artery. Both PICA origins remain patent. Basilar artery is patent with mild irregularity, no significant stenosis. SCA origins are normal. Fetal type bilateral PCA origins. Moderate to severe bilateral P 2 segment PCA stenosis worse on the left (series 306, image 13). Preserved distal PCA flow. Antegrade flow in the distal cervical ICAs, the right is tortuous. Antegrade flow in both ICA siphons. No siphon stenosis. Ophthalmic and posterior communicating artery origins are normal. Patent carotid termini. Normal ACA origins. Moderate to severe left ACA A1 stenosis (series 304, image 10). Diminutive anterior communicating artery. Visualized ACA branches are within normal limits.  Left MCA origin, M1 segment, bifurcation, and left MCA branches are within normal limits. Right MCA origin and M1 segment are normal. The right MCA bifurcation is patent, but the posterior sylvian M2 branches occluded about 8 mm beyond its origin. IMPRESSION: 1. Positive for Posterior right MCA M2 branch occlusion 8 mm beyond its origin. 2. No more proximal right anterior circulation lesion. 3. Intracranial atherosclerosis with moderate or severe stenoses: - Left vertebral artery distal V4 segment, - Bilateral PCA P2 segments worse on the left, and - Left ACA A1 segment. Electronically Signed: By: Odessa Fleming M.D. On: 01/20/2016 08:48     LOS: 2 days   Jeoffrey Massed, MD  [facility] Pager:[phone]  If 7PM-7AM, please contact night-coverage [email or web address] Password [email or web address] 01/21/2016, 3:12 PM

## 2016-01-21 NOTE — Evaluation (Signed)
Occupational Therapy Evaluation Patient Details Name: [name] MRN: [id number] DOB: 19-May-1934 Today's Date: 01/21/2016    History of Present Illness  82 y.o. female history of hypertension, anemia, arthritis, hypercholesterolemia, hypothyroidism, and recurrent urinary tract infections and stroke recently who presented with persistent weakness of her left hand. She came to the emergency room after a fall in her bathroom.  MRI of the brain showed mild local propagation of RIGHT MCA territory acute small infarcts.   Clinical Impression   Pt admitted with above. Pt independent with ADLs, PTA. Feel pt will benefit from acute OT to increase independence and address Lt UE prior to d/c. Recommending CIR for rehab.    Follow Up Recommendations  CIR    Equipment Recommendations  Other (comment) (defer to next venue)    Recommendations for Other Services Rehab consult     Precautions / Restrictions Precautions Precautions: Fall Restrictions Weight Bearing Restrictions: No      Mobility Bed Mobility Overal bed mobility: Modified Independent                Transfers Overall transfer level: Needs assistance   Transfers: Sit to/from Stand Sit to Stand: Supervision         General transfer comment: RW in front of pt    Balance      history of fall                                      ADL Overall ADL's : Needs assistance/impaired     Grooming: Sitting;Supervision/safety;Applying deodorant;Brushing hair;Set up               Lower Body Dressing: Minimal assistance;Sit to/from stand   Toilet Transfer: RW;Min guard;Ambulation (sit to stand from bed)           Functional mobility during ADLs: Min guard;Rolling walker (ambulated with and without RW) General ADL Comments: Encouraged pt to be using Lt hand.      Vision  Pt wears glasses all the time; reports no change from baseline   Perception     Praxis      Pertinent  Vitals/Pain Pain Assessment: No/denies pain     Hand Dominance Right   Extremity/Trunk Assessment Upper Extremity Assessment Upper Extremity Assessment: LUE deficits/detail;RUE deficits/detail RUE Deficits / Details: weakness in shoulder flexors LUE Deficits / Details: decreased grip strength; decreased strength in shoulder flexors (left weaker than right) and elbow flexors LUE Sensation: decreased light touch LUE Coordination: decreased fine motor;decreased gross motor   Lower Extremity Assessment Lower Extremity Assessment: Defer to PT evaluation       Communication Communication Communication: Expressive difficulties   Cognition Arousal/Alertness: Awake/alert (became awake in session) Behavior During Therapy: WFL for tasks assessed/performed Overall Cognitive Status: Within Functional Limits for tasks assessed                     General Comments       Exercises       Shoulder Instructions      Home Living Family/patient expects to be discharged to:: Inpatient rehab Living Arrangements: Children Available Help at Discharge: Family Type of Home: House Home Access: Ramped entrance     Home Layout: One level     Bathroom Shower/Tub: Walk-in shower;Tub/shower unit   Bathroom Toilet: Handicapped height     Home Equipment: Environmental consultant - 2 wheels;Cane - single point;Wheelchair - manual;Grab bars -  toilet;Grab bars - tub/shower;Shower seat - built in         Prior Functioning/Environment Level of Independence: Independent             OT Diagnosis: Generalized weakness;Other (comment) (decreased coordination)   OT Problem List: Impaired UE functional use;Decreased knowledge of use of DME or AE;Decreased coordination;Impaired balance (sitting and/or standing);Decreased strength;Impaired sensation   OT Treatment/Interventions: Self-care/ADL training;DME and/or AE instruction;Therapeutic activities;Therapeutic exercise;Patient/family education;Balance  training    OT Goals(Current goals can be found in the care plan section) Acute Rehab OT Goals Patient Stated Goal: get back to doing regular stuff OT Goal Formulation: With patient Time For Goal Achievement: 01/28/16 Potential to Achieve Goals: Good ADL Goals Pt Will Perform Grooming: with set-up;standing (incorporating use of Lt UE) Pt Will Perform Lower Body Dressing: with set-up;with supervision;sit to/from stand Pt Will Transfer to Toilet: ambulating;with modified independence;grab bars (with set up for RW; elevated toilet) Pt Will Perform Toileting - Clothing Manipulation and hygiene: with modified independence;sit to/from stand Additional ADL Goal #1: Pt will independently perform HEP for Lt UE to increase strength and coordination.  OT Frequency: Min 2X/week   Barriers to D/C:            Co-evaluation              End of Session Equipment Utilized During Treatment: Rolling walker  Activity Tolerance: Patient tolerated treatment well Patient left: with bed alarm set;in bed;with call bell/phone within reach   Time: 4034-7425 OT Time Calculation (min): 15 min Charges:  OT General Charges $OT Visit: 1 Procedure OT Evaluation $OT Eval Moderate Complexity: 1 Procedure G-CodesEarlie Raveling OTR/L [phone] 01/21/2016, 12:53 PM

## 2016-01-22 LAB — URINE CULTURE

## 2016-01-22 MED ORDER — CLONAZEPAM 0.5 MG PO TABS
0.5000 mg | ORAL_TABLET | Freq: Every day | ORAL | Status: DC
  Administered 2016-01-22 – 2016-01-23 (×2): 0.5 mg via ORAL
  Filled 2016-01-22 (×3): qty 1

## 2016-01-22 NOTE — Progress Notes (Signed)
PROGRESS NOTE        PATIENT DETAILS Name: [name] Age: 82 y.o. Sex: female Date of Birth: Sep 24, 1933 Admit Date: 01/19/2016 Admitting Physician Eduard Clos, MD WNU:[id number], MD Outpatient Specialists: Leonides Sake  Brief Narrative: Patient is a 82 y.o. female with past medical history of hypertension, dyslipidemia, hypothyroidism who was just discharged from [facility] on 5/5 after workup for CVA, brought in for worsening left arm weakness and slurred speech. Further evaluation revealed extension of recent infarct and a UTI.  Subjective:  No dysarthria this morning. Left arm strength has improved this morning. Feels overall better, still has L facial droop.  Assessment/Plan:   Acute right MCA territory infarcts with extension: Left upper extremity strength has improved, approximately 4/5 this a.m.  Recent 2-D echo and carotid Doppler done at [facility]- without any significant abnormalities. MRA shows significant intracranial atherosclerotic disease, recommendations from radiology are to continue dual antiplatelets with aspirin and Plavix for 3 months, and then Plavix alone. Neurology recommends a 30 day event monitor, have spoken with cardiology to see if this can be arranged. A1c 5.3, LDL 95. Statin added to Zetia. Seen by physical therapy, recommendations are for CIR-have consulted CIR-awaiting evaluation. Will need CIR/SNF.   UTI: Continue IV Rocephin stop 5-15.  HTN (hypertension): Allow permissive hypertension-would avoid aggressive control of blood pressure in the setting of extension of CVA. Blood pressure seems to be reasonably well-controlled-would continue with lisinopril and labetalol- will resume Norvasc on 01-23-16  Dyslipidemia: LDL 95-continue with pravastatin and zetia  Hypothyroidism: Continue levothyroxine  DVT Prophylaxis: Prophylactic Heparin   Code Status: Full code   Family Communication: None at  bedside  Disposition Plan: CIR/SNF  Antimicrobial agents: IV Rocephin 5/11>>5/15  Procedures: None  CONSULTS:  neurology  Time spent: 25 minutes-Greater than 50% of this time was spent in counseling, explanation of diagnosis, planning of further management, and coordination of care.  MEDICATIONS: Anti-infectives    Start     Dose/Rate Route Frequency Ordered Stop   01/19/16 1015  cefTRIAXone (ROCEPHIN) 2 g in dextrose 5 % 50 mL IVPB     2 g 100 mL/hr over 30 Minutes Intravenous Every 24 hours 01/19/16 0903 01/23/16 1014      Scheduled Meds: . aspirin  325 mg Oral Daily  . cefTRIAXone (ROCEPHIN)  IV  2 g Intravenous Q24H  . clonazePAM  0.5 mg Oral Daily  . clopidogrel  75 mg Oral Daily  . ezetimibe  10 mg Oral QHS  . gi cocktail  30 mL Oral Once  . heparin  5,000 Units Subcutaneous Q8H  . labetalol  300 mg Oral BID  . levothyroxine  50 mcg Oral QAC breakfast  . lisinopril  40 mg Oral QHS  . pravastatin  20 mg Oral Daily  . sodium chloride flush  3 mL Intravenous Q12H   Continuous Infusions:   PRN Meds:.acetaminophen **OR** [DISCONTINUED] acetaminophen, lactose free nutrition, [DISCONTINUED] ondansetron **OR** ondansetron (ZOFRAN) IV, oxyCODONE   PHYSICAL EXAM: Vital signs: Filed Vitals:   01/21/16 1838 01/22/16 0205 01/22/16 0613 01/22/16 0947  BP: 152/86 152/78 177/90 161/76  Pulse: 64 59 70 69  Temp: 98 F (36.7 C) 97.8 F (36.6 C) 98.7 F (37.1 C) 97.7 F (36.5 C)  TempSrc: Oral Oral Oral Oral  Resp: 20 16 16 20   SpO2: 98% 98% 97% 97%  There were no vitals filed for this visit. There is no weight on file to calculate BMI.   Gen Exam: Awake and alert with clear speech. Not in any distress Neck: Supple, No JVD.   Chest: B/L Clear.   CVS: S1 S2 Regular, no murmurs.  Abdomen: soft, BS +, non tender, non distended.  Extremities: no edema, lower extremities warm to touch. Neurologic: LUE 4/5,LLE 4/5  Skin: No Rash or lesions   Wounds: N/A.    LABORATORY DATA: CBC:  Recent Labs Lab 01/19/16 0207 01/19/16 0212 01/19/16 0846 01/20/16 0605  WBC 7.2  --  6.6 6.2  NEUTROABS 4.3  --   --   --   HGB 11.7* 12.2 12.0 11.1*  HCT 34.7* 36.0 34.4* 32.5*  MCV 90.4  --  92.0 92.3  PLT 167  --  155 159    Basic Metabolic Panel:  Recent Labs Lab 01/19/16 0207 01/19/16 0212 01/19/16 0846 01/20/16 0605  NA 137 137  --  138  K 4.5 4.4  --  4.1  CL 106 106  --  106  CO2 20*  --   --  20*  GLUCOSE 118* 115*  --  96  BUN 22* 22*  --  16  CREATININE 0.79 0.80 0.78 0.73  CALCIUM 10.1  --   --  9.5    GFR: CrCl cannot be calculated (Unknown ideal weight.).  Liver Function Tests:  Recent Labs Lab 01/19/16 0207  AST 41  ALT 45  ALKPHOS 76  BILITOT 1.3*  PROT 6.5  ALBUMIN 4.3   No results for input(s): LIPASE, AMYLASE in the last 168 hours. No results for input(s): AMMONIA in the last 168 hours.  Coagulation Profile:  Recent Labs Lab 01/19/16 0207  INR 1.16    Cardiac Enzymes: No results for input(s): CKTOTAL, CKMB, CKMBINDEX, TROPONINI in the last 168 hours.  BNP (last 3 results) No results for input(s): PROBNP in the last 8760 hours.  HbA1C:  Recent Labs  01/20/16 0605  HGBA1C 5.5    CBG:  Recent Labs Lab 01/20/16 0649 01/20/16 1111 01/20/16 1657 01/21/16 0703 01/21/16 1130  GLUCAP 97 127* 104* 103* 98    Lipid Profile:  Recent Labs  01/20/16 0605  CHOL 163  HDL 23*  LDLCALC 95  TRIG 564*  CHOLHDL 7.1    Thyroid Function Tests: No results for input(s): TSH, T4TOTAL, FREET4, T3FREE, THYROIDAB in the last 72 hours.  Anemia Panel: No results for input(s): VITAMINB12, FOLATE, FERRITIN, TIBC, IRON, RETICCTPCT in the last 72 hours.  Urine analysis:    Component Value Date/Time   COLORURINE YELLOW 01/19/2016 0415   APPEARANCEUR CLOUDY* 01/19/2016 0415   LABSPEC 1.009 01/19/2016 0415   PHURINE 6.0 01/19/2016 0415   GLUCOSEU NEGATIVE 01/19/2016 0415   HGBUR NEGATIVE  01/19/2016 0415   BILIRUBINUR NEGATIVE 01/19/2016 0415   KETONESUR NEGATIVE 01/19/2016 0415   PROTEINUR NEGATIVE 01/19/2016 0415   UROBILINOGEN 0.2 03/23/2014 0403   NITRITE NEGATIVE 01/19/2016 0415   LEUKOCYTESUR LARGE* 01/19/2016 0415    Sepsis Labs: Lactic Acid, Venous No results found for: LATICACIDVEN  MICROBIOLOGY: Recent Results (from the past 240 hour(s))  Culture, Urine     Status: Abnormal   Collection Time: 01/19/16  4:15 AM  Result Value Ref Range Status   Specimen Description URINE, CLEAN CATCH  Final   Special Requests NONE  Final   Culture >=100,000 COLONIES/mL ESCHERICHIA COLI (A)  Final   Report Status 01/22/2016 FINAL  Final  Organism ID, Bacteria ESCHERICHIA COLI (A)  Final      Susceptibility   Escherichia coli - MIC*    AMPICILLIN 8 SENSITIVE Sensitive     CEFAZOLIN <=4 SENSITIVE Sensitive     CEFTRIAXONE <=1 SENSITIVE Sensitive     CIPROFLOXACIN >=4 RESISTANT Resistant     GENTAMICIN <=1 SENSITIVE Sensitive     IMIPENEM <=0.25 SENSITIVE Sensitive     NITROFURANTOIN <=16 SENSITIVE Sensitive     TRIMETH/SULFA >=320 RESISTANT Resistant     AMPICILLIN/SULBACTAM 4 SENSITIVE Sensitive     PIP/TAZO <=4 SENSITIVE Sensitive     * >=100,000 COLONIES/mL ESCHERICHIA COLI    RADIOLOGY STUDIES/RESULTS: Dg Chest 2 View  01/19/2016  CLINICAL DATA:  Stroke last night.  Weakness. EXAM: CHEST  2 VIEW COMPARISON:  03/23/2014 FINDINGS: Diffuse interstitial prominence throughout the lungs, unchanged, likely chronic interstitial lung disease. Mild emphysema/COPD. Heart is borderline in size. No confluent opacities or effusions. No real change since prior study. IMPRESSION: Stable chronic interstitial lung disease and COPD. Borderline heart size. Electronically Signed   By: Charlett Nose M.D.   On: 01/19/2016 11:58   Mr Brain Wo Contrast  01/19/2016  CLINICAL DATA:  Larey Seat and hit head on toilet tonight. Gradual onset LEFT hand weakness for 1 week. Assess for new stroke. Recent  diagnosis of stroke. Off aspirin and Plavix. History of hypertension and hypercholesterolemia. EXAM: MRI HEAD WITHOUT CONTRAST TECHNIQUE: Multiplanar, multiecho pulse sequences of the brain and surrounding structures were obtained without intravenous contrast. COMPARISON:  MRI of the head Jan 13, 2016 FINDINGS: Patchy reduced diffusion RIGHT frontoparietal junction, with a few new punctate foci of reduced diffusion in similar distribution. Low ADC values in confluent area of reduced diffusion associated T2 bright FLAIR signal. No susceptibility artifact to suggest hemorrhage. Punctate foci of susceptibility artifact in bilateral thalamus, LEFT cerebellum, LEFT dorsal pons can be seen in the setting of chronic hypertension. No midline shift, mass effect or mass lesions. Ventricles and sulci are normal for patient's age. Patchy to confluent supratentorial and pontine white matter FLAIR T2 hyperintensities. No abnormal extra-axial fluid collections. Major intracranial vascular flow voids present at the skullbase. Attenuated flow voids within RIGHT sylvian fissure. Paranasal sinuses and mastoid air cells are well aerated. Ocular globes and orbital contents are normal. No abnormal sellar expansion. No cerebellar tonsillar ectopia. No suspicious calvarial bone marrow signal. IMPRESSION: Mild local propagation of RIGHT MCA territory acute small infarcts. Attenuated RIGHT middle cerebral artery mid to distal segments would be better characterized on MRA versus CTA HEAD. Involutional changes. Moderate chronic small vessel ischemic disease. Electronically Signed   By: Awilda Metro M.D.   On: 01/19/2016 06:23   Mr Palma Holter  01/20/2016  ADDENDUM REPORT: 01/20/2016 09:03 ADDENDUM: Study discussed by telephone with Neurology Dr. Marvel Plan, on 01/20/2016 at 0855 hours Electronically Signed   By: Odessa Fleming M.D.   On: 01/20/2016 09:03  01/20/2016  CLINICAL DATA:  82 year old female status post right MCA infarct with mild  extension since 01/13/2016. Initial encounter. EXAM: MRA HEAD WITHOUT CONTRAST TECHNIQUE: Angiographic images of the Circle of Willis were obtained using MRA technique without intravenous contrast. COMPARISON:  Brain MRI 01/19/2016, 01/13/2016, and earlier FINDINGS: No intracranial mass effect or ventriculomegaly. Antegrade flow in the posterior circulation with irregular and stenotic distal left vertebral artery. Severe left V4 stenosis occurs just proximal to the vertebrobasilar junction (series 305, image 10). Negative distal right vertebral artery. Both PICA origins remain patent. Basilar artery is patent with mild  irregularity, no significant stenosis. SCA origins are normal. Fetal type bilateral PCA origins. Moderate to severe bilateral P 2 segment PCA stenosis worse on the left (series 306, image 13). Preserved distal PCA flow. Antegrade flow in the distal cervical ICAs, the right is tortuous. Antegrade flow in both ICA siphons. No siphon stenosis. Ophthalmic and posterior communicating artery origins are normal. Patent carotid termini. Normal ACA origins. Moderate to severe left ACA A1 stenosis (series 304, image 10). Diminutive anterior communicating artery. Visualized ACA branches are within normal limits. Left MCA origin, M1 segment, bifurcation, and left MCA branches are within normal limits. Right MCA origin and M1 segment are normal. The right MCA bifurcation is patent, but the posterior sylvian M2 branches occluded about 8 mm beyond its origin. IMPRESSION: 1. Positive for Posterior right MCA M2 branch occlusion 8 mm beyond its origin. 2. No more proximal right anterior circulation lesion. 3. Intracranial atherosclerosis with moderate or severe stenoses: - Left vertebral artery distal V4 segment, - Bilateral PCA P2 segments worse on the left, and - Left ACA A1 segment. Electronically Signed: By: Odessa Fleming M.D. On: 01/20/2016 08:48     LOS: 3 days   Leroy Sea, MD  [facility] Pager:[phone]  If 7PM-7AM, please contact night-coverage [email or web address] Password [email or web address] 01/22/2016, 10:16 AM

## 2016-01-22 NOTE — Evaluation (Signed)
Speech Language Pathology Evaluation Patient Details Name: [name] MRN: [id number] DOB: 30-Jan-1934 Today's Date: 01/22/2016 Time: 1100-1117 SLP Time Calculation (min) (ACUTE ONLY): 17 min  Problem List:  Patient Active Problem List   Diagnosis Date Noted  . Intracranial vascular stenosis   . Ischemic stroke (HCC) 01/19/2016  . Recurrent UTI 01/19/2016  . HLD (hyperlipidemia) 01/19/2016  . Intertrochanteric fracture of right hip (HCC) 03/23/2014  . HTN (hypertension) 03/23/2014  . Hypothyroidism 03/23/2014  . Anemia 03/23/2014  . Thrombocytopenia, unspecified (HCC) 03/23/2014  . Closed right hip fracture (HCC) 03/23/2014  . Pseudoaneurysm of right femoral artery (HCC) 01/31/2014  . Pseudoaneurysm of femoral artery (HCC) 01/30/2014  . Splenic artery aneurysm (HCC) 12/04/2013   Past Medical History:  Past Medical History  Diagnosis Date  . Hypertension   . High cholesterol   . Stroke [facility]) 1990's    "mild", denies residual on 01/28/2014  . Pneumonia     "maybe in my teens"  . Hypothyroidism   . Anemia   . History of blood transfusion     "S/P knee OR"  . Arthritis     "was in my knees"  . Recurrent UTI (urinary tract infection)    Past Surgical History:  Past Surgical History  Procedure Laterality Date  . Muscle biopsy Left 1990's    "knot biopsy from carrying mail bag for years"  . Bladder suspension  2012  . Splenic artery embolization  01/28/2014  . Joint replacement    . Total knee arthroplasty Bilateral 2012  . Vaginal hysterectomy    . Revison of arteriovenous fistula Right 01/31/2014    Procedure: REPAIR PSEUDOANEURYSM;  Surgeon: Nada Libman, MD;  Location: [facility] OR;  Service: Vascular;  Laterality: Right;  Repair of Femoral Pseudoaneurysm.  . Femur im nail Right 03/23/2014    Procedure: INTRAMEDULLARY (IM) NAIL FEMORAL;  Surgeon: Shelda Pal, MD;  Location: [facility] ORS;  Service: Orthopedics;  Laterality: Right;  . Visceral angiogram N/A 01/28/2014   Procedure: VISCERAL ANGIOGRAM;  Surgeon: Fransisco Hertz, MD;  Location: [facility] CATH LAB;  Service: Cardiovascular;  Laterality: N/A;  . Embolization N/A 01/28/2014    Procedure: EMBOLIZATION;  Surgeon: Fransisco Hertz, MD;  Location: [facility] CATH LAB;  Service: Cardiovascular;  Laterality: N/A;  Splenic Artery   HPI:  [name] is a 82 y.o. female with history of hypertension, hypercholesterolemia, hypothyroidism, and recurrent urinary tract infections and stroke one week presenting with persistent left hand and facial weakness.   Assessment / Plan / Recommendation Clinical Impression  Pt demonstrates a mild to moderate dysarthria due to left CN VII and XII weakness. Pt also with rapid rate of speech at baseline. Provided instruction for slowing rate, overarticulating and increasing volume. Pt able to return demosntrate with min verbal cues during reading, but does not carry over to conversation.  Provided pt with newspaper and used books from [employer] and instructed her to read aloud with recommended strategies.  Will benefit from f/u with SLP at CIR.     SLP Assessment  Patient needs continued Speech Lanaguage Pathology Services    Follow Up Recommendations  Inpatient Rehab    Frequency and Duration min 2x/week  2 weeks      SLP Evaluation Prior Functioning  Cognitive/Linguistic Baseline: Within functional limits Type of Home: House Available Help at Discharge: Family   Cognition  Overall Cognitive Status: Within Functional Limits for tasks assessed    Comprehension  Auditory Comprehension Overall Auditory Comprehension:  Appears within functional limits for tasks assessed    Expression Verbal Expression Overall Verbal Expression: Appears within functional limits for tasks assessed   Oral / Motor  Oral Motor/Sensory Function Overall Oral Motor/Sensory Function: Mild impairment Facial ROM: Reduced left;Suspected CN VII (facial) dysfunction Facial Symmetry: Abnormal  symmetry left;Suspected CN VII (facial) dysfunction Facial Strength: Reduced left;Suspected CN VII (facial) dysfunction Facial Sensation: Within Functional Limits Lingual ROM: Within Functional Limits Lingual Symmetry: Abnormal symmetry left;Suspected CN XII (hypoglossal) dysfunction Lingual Strength: Suspected CN XII (hypoglossal) dysfunction Lingual Sensation: Reduced;Suspected CN VII (facial) dysfunction-anterior 2/3 tongue Velum: Within Functional Limits Mandible: Within Functional Limits Motor Speech Overall Motor Speech: Impaired Respiration: Within functional limits Phonation: Normal Resonance: Within functional limits Articulation: Impaired Level of Impairment: Word Intelligibility: Intelligibility reduced Word: 75-100% accurate Phrase: 50-74% accurate Sentence: 50-74% accurate Conversation: 50-74% accurate Motor Planning: Witnin functional limits Motor Speech Errors: Aware   GO                   Harlon Ditty, MA CCC-SLP [phone]  Claudine Mouton 01/22/2016, 11:37 AM

## 2016-01-23 ENCOUNTER — Inpatient Hospital Stay (HOSPITAL_COMMUNITY)
Admission: RE | Admit: 2016-01-23 | Discharge: 2016-02-01 | DRG: 057 | Disposition: A | Discharge status: Home Health | Payer: Medicare Other | Source: Intra-hospital | Attending: Physical Medicine & Rehabilitation | Admitting: Physical Medicine & Rehabilitation

## 2016-01-23 ENCOUNTER — Other Ambulatory Visit: Payer: Self-pay | Admitting: Cardiology

## 2016-01-23 DIAGNOSIS — I69322 Dysarthria following cerebral infarction: Secondary | ICD-10-CM | POA: Diagnosis not present

## 2016-01-23 DIAGNOSIS — E785 Hyperlipidemia, unspecified: Secondary | ICD-10-CM

## 2016-01-23 DIAGNOSIS — R269 Unspecified abnormalities of gait and mobility: Secondary | ICD-10-CM

## 2016-01-23 DIAGNOSIS — N390 Urinary tract infection, site not specified: Secondary | ICD-10-CM

## 2016-01-23 DIAGNOSIS — Z96653 Presence of artificial knee joint, bilateral: Secondary | ICD-10-CM

## 2016-01-23 DIAGNOSIS — F419 Anxiety disorder, unspecified: Secondary | ICD-10-CM

## 2016-01-23 DIAGNOSIS — G8194 Hemiplegia, unspecified affecting left nondominant side: Secondary | ICD-10-CM | POA: Diagnosis not present

## 2016-01-23 DIAGNOSIS — E038 Other specified hypothyroidism: Secondary | ICD-10-CM

## 2016-01-23 DIAGNOSIS — I69354 Hemiplegia and hemiparesis following cerebral infarction affecting left non-dominant side: Principal | ICD-10-CM

## 2016-01-23 DIAGNOSIS — G8190 Hemiplegia, unspecified affecting unspecified side: Secondary | ICD-10-CM | POA: Diagnosis not present

## 2016-01-23 DIAGNOSIS — I69398 Other sequelae of cerebral infarction: Secondary | ICD-10-CM | POA: Diagnosis not present

## 2016-01-23 DIAGNOSIS — Z79899 Other long term (current) drug therapy: Secondary | ICD-10-CM

## 2016-01-23 DIAGNOSIS — I63311 Cerebral infarction due to thrombosis of right middle cerebral artery: Secondary | ICD-10-CM

## 2016-01-23 DIAGNOSIS — D62 Acute posthemorrhagic anemia: Secondary | ICD-10-CM

## 2016-01-23 DIAGNOSIS — I69392 Facial weakness following cerebral infarction: Secondary | ICD-10-CM

## 2016-01-23 DIAGNOSIS — I63511 Cerebral infarction due to unspecified occlusion or stenosis of right middle cerebral artery: Secondary | ICD-10-CM

## 2016-01-23 DIAGNOSIS — I10 Essential (primary) hypertension: Secondary | ICD-10-CM

## 2016-01-23 DIAGNOSIS — R1013 Epigastric pain: Secondary | ICD-10-CM

## 2016-01-23 DIAGNOSIS — Z87440 Personal history of urinary (tract) infections: Secondary | ICD-10-CM | POA: Diagnosis not present

## 2016-01-23 DIAGNOSIS — E039 Hypothyroidism, unspecified: Secondary | ICD-10-CM

## 2016-01-23 DIAGNOSIS — E860 Dehydration: Secondary | ICD-10-CM

## 2016-01-23 DIAGNOSIS — B9620 Unspecified Escherichia coli [E. coli] as the cause of diseases classified elsewhere: Secondary | ICD-10-CM

## 2016-01-23 LAB — GLUCOSE, CAPILLARY: GLUCOSE-CAPILLARY: 122 mg/dL — AB (ref 65–99)

## 2016-01-23 MED ORDER — PROCHLORPERAZINE 25 MG RE SUPP: 12.5000 mg | Freq: Four times a day (QID) | RECTAL | Status: DC | PRN

## 2016-01-23 MED ORDER — PROCHLORPERAZINE EDISYLATE 5 MG/ML IJ SOLN: 5.0000 mg | Freq: Four times a day (QID) | INTRAMUSCULAR | Status: DC | PRN

## 2016-01-23 MED ORDER — LISINOPRIL 40 MG PO TABS
40.0000 mg | ORAL_TABLET | Freq: Every day | ORAL | Status: DC
  Administered 2016-01-23 – 2016-01-31 (×9): 40 mg via ORAL
  Filled 2016-01-23 (×9): qty 1

## 2016-01-23 MED ORDER — FAMOTIDINE 10 MG PO TABS
10.0000 mg | ORAL_TABLET | Freq: Two times a day (BID) | ORAL | Status: DC
  Administered 2016-01-23 – 2016-02-01 (×18): 10 mg via ORAL
  Filled 2016-01-23 (×18): qty 1

## 2016-01-23 MED ORDER — LEVOTHYROXINE SODIUM 50 MCG PO TABS
50.0000 ug | ORAL_TABLET | Freq: Every day | ORAL | Status: DC
  Administered 2016-01-24 – 2016-02-01 (×9): 50 ug via ORAL
  Filled 2016-01-23 (×9): qty 1

## 2016-01-23 MED ORDER — PRAVASTATIN SODIUM 20 MG PO TABS
20.0000 mg | ORAL_TABLET | Freq: Every day | ORAL | Status: DC
  Administered 2016-01-24 – 2016-02-01 (×9): 20 mg via ORAL
  Filled 2016-01-23 (×9): qty 1

## 2016-01-23 MED ORDER — ENOXAPARIN SODIUM 40 MG/0.4ML ~~LOC~~ SOLN
40.0000 mg | SUBCUTANEOUS | Status: DC
  Administered 2016-01-23 – 2016-01-31 (×9): 40 mg via SUBCUTANEOUS
  Filled 2016-01-23 (×9): qty 0.4

## 2016-01-23 MED ORDER — BISACODYL 10 MG RE SUPP: 10.0000 mg | Freq: Every day | RECTAL | Status: DC | PRN

## 2016-01-23 MED ORDER — ONDANSETRON HCL 4 MG/2ML IJ SOLN: 4.0000 mg | Freq: Four times a day (QID) | INTRAMUSCULAR | Status: DC | PRN

## 2016-01-23 MED ORDER — BOOST PO LIQD
237.0000 mL | Freq: Three times a day (TID) | ORAL | Status: DC | PRN
  Filled 2016-01-23: qty 237

## 2016-01-23 MED ORDER — TRAZODONE HCL 50 MG PO TABS: 25.0000 mg | ORAL_TABLET | Freq: Every evening | ORAL | Status: DC | PRN

## 2016-01-23 MED ORDER — ACETAMINOPHEN 325 MG PO TABS: 325.0000 mg | ORAL_TABLET | ORAL | Status: DC | PRN

## 2016-01-23 MED ORDER — ALUM & MAG HYDROXIDE-SIMETH 200-200-20 MG/5ML PO SUSP: 30.0000 mL | ORAL | Status: DC | PRN

## 2016-01-23 MED ORDER — DIPHENHYDRAMINE HCL 12.5 MG/5ML PO ELIX: 12.5000 mg | ORAL_SOLUTION | Freq: Four times a day (QID) | ORAL | Status: DC | PRN

## 2016-01-23 MED ORDER — FLEET ENEMA 7-19 GM/118ML RE ENEM: 1.0000 | ENEMA | Freq: Once | RECTAL | Status: DC | PRN

## 2016-01-23 MED ORDER — PROCHLORPERAZINE MALEATE 5 MG PO TABS: 5.0000 mg | ORAL_TABLET | Freq: Four times a day (QID) | ORAL | Status: DC | PRN

## 2016-01-23 MED ORDER — CLOPIDOGREL BISULFATE 75 MG PO TABS: 75.0000 mg | ORAL_TABLET | Freq: Every day | ORAL | Status: DC

## 2016-01-23 MED ORDER — ASPIRIN 325 MG PO TABS
325.0000 mg | ORAL_TABLET | Freq: Every day | ORAL | Status: DC
  Administered 2016-01-24 – 2016-02-01 (×9): 325 mg via ORAL
  Filled 2016-01-23 (×9): qty 1

## 2016-01-23 MED ORDER — CLONAZEPAM 0.5 MG PO TABS
0.5000 mg | ORAL_TABLET | Freq: Every day | ORAL | Status: DC
  Administered 2016-01-24 – 2016-02-01 (×9): 0.5 mg via ORAL
  Filled 2016-01-23 (×9): qty 1

## 2016-01-23 MED ORDER — EZETIMIBE 10 MG PO TABS
10.0000 mg | ORAL_TABLET | Freq: Every day | ORAL | Status: DC
  Administered 2016-01-23 – 2016-01-31 (×9): 10 mg via ORAL
  Filled 2016-01-23 (×9): qty 1

## 2016-01-23 MED ORDER — LABETALOL HCL 300 MG PO TABS
300.0000 mg | ORAL_TABLET | Freq: Two times a day (BID) | ORAL | Status: DC
  Administered 2016-01-23 – 2016-02-01 (×18): 300 mg via ORAL
  Filled 2016-01-23 (×18): qty 1

## 2016-01-23 MED ORDER — ASPIRIN 325 MG PO TABS: 325.0000 mg | ORAL_TABLET | Freq: Every day | ORAL | Status: DC

## 2016-01-23 MED ORDER — CLOPIDOGREL BISULFATE 75 MG PO TABS
75.0000 mg | ORAL_TABLET | Freq: Every day | ORAL | Status: DC
  Administered 2016-01-24 – 2016-02-01 (×9): 75 mg via ORAL
  Filled 2016-01-23 (×9): qty 1

## 2016-01-23 MED ORDER — GUAIFENESIN-DM 100-10 MG/5ML PO SYRP: 5.0000 mL | ORAL_SOLUTION | Freq: Four times a day (QID) | ORAL | Status: DC | PRN

## 2016-01-23 MED ORDER — PRAVASTATIN SODIUM 20 MG PO TABS: 20.0000 mg | ORAL_TABLET | Freq: Every day | ORAL | Status: DC

## 2016-01-23 NOTE — Progress Notes (Signed)
Ankit Lorie Phenix, MD Physician Signed Physical Medicine and Rehabilitation Consult Note 01/23/2016 5:54 AM  Related encounter: ED to Hosp-Admission (Current) from 01/19/2016 in [place] Collapse All        Physical Medicine and Rehabilitation Consult Reason for Consult: Propagation of right MCA territory acute infarcts Referring Physician: [facility]ad  HPI: [name] is a 82 y.o. right handed female with history of hypertension, hyperlipidemia, recurrent UTIs. Patient with history of CVA with resultant left-sided weakness and slurred speech one week ago treated at [facility] maintained on Plavix. She had stopped taking her Plavix for reasons that were unclear. She lives with her son was independent prior to admission. Son recently had back surgery and can provide limited physical assistance. Presented 01/19/2016 presenting with increasing left hand weakness and a fall in the bathroom. MRI showed mild global propagation of right MCA territory acute small infarcts. MRA of the head positive for posterior right MCA M2 branch occlusion. Neurology consulted presently on aspirin and Plavix for CVA prophylaxis. Subcutaneous heparin for DVT prophylaxis. Urine culture greater than 100,000 Escherichia coli maintained on Rocephin. Tolerating a regular diet. Physical and occupational therapy evaluations completed with recommendations of physical medicine rehabilitation consult.   Review of Systems  Constitutional: Negative for fever and chills.  HENT: Negative for hearing loss.  Eyes: Negative for blurred vision and double vision.  Respiratory: Negative for cough and shortness of breath.  Cardiovascular: Positive for leg swelling. Negative for chest pain and palpitations.  Gastrointestinal: Positive for constipation. Negative for nausea and vomiting.  Genitourinary: Positive for urgency. Negative for dysuria and hematuria.  Musculoskeletal:  Positive for myalgias and joint pain.  Neurological: Positive for speech change and weakness. Negative for seizures and headaches.  All other systems reviewed and are negative.  Past Medical History  Diagnosis Date  . Hypertension   . High cholesterol   . Stroke [facility]) 1990's    "mild", denies residual on 01/28/2014  . Pneumonia     "maybe in my teens"  . Hypothyroidism   . Anemia   . History of blood transfusion     "S/P knee OR"  . Arthritis     "was in my knees"  . Recurrent UTI (urinary tract infection)    Past Surgical History  Procedure Laterality Date  . Muscle biopsy Left 1990's    "knot biopsy from carrying mail bag for years"  . Bladder suspension  2012  . Splenic artery embolization  01/28/2014  . Joint replacement    . Total knee arthroplasty Bilateral 2012  . Vaginal hysterectomy    . Revison of arteriovenous fistula Right 01/31/2014    Procedure: REPAIR PSEUDOANEURYSM; Surgeon: Serafina Mitchell, MD; Location: [facility] OR; Service: Vascular; Laterality: Right; Repair of Femoral Pseudoaneurysm.  . Femur im nail Right 03/23/2014    Procedure: INTRAMEDULLARY (IM) NAIL FEMORAL; Surgeon: Mauri Pole, MD; Location: [facility] ORS; Service: Orthopedics; Laterality: Right;  . Visceral angiogram N/A 01/28/2014    Procedure: VISCERAL ANGIOGRAM; Surgeon: Conrad Schulter, MD; Location: [facility] CATH LAB; Service: Cardiovascular; Laterality: N/A;  . Embolization N/A 01/28/2014    Procedure: EMBOLIZATION; Surgeon: Conrad Leslie, MD; Location: [facility] CATH LAB; Service: Cardiovascular; Laterality: N/A; Splenic Artery   Family History  Problem Relation Age of Onset  . Hypertension Son    Social History:  reports that she has never smoked. She has never used smokeless tobacco. She reports that she does not drink alcohol  or use illicit drugs. Allergies:  Allergies  Allergen  Reactions  . Aspirin Other (See Comments)    Causes blood in urine   Medications Prior to Admission  Medication Sig Dispense Refill  . amLODipine (NORVASC) 5 MG tablet Take 5 mg by mouth at bedtime.    . Calcium Carbonate-Vitamin D (CALCIUM 500 + D PO) Take 500 mg by mouth 2 (two) times daily.    . cholecalciferol (VITAMIN D) 1000 UNITS tablet Take 2,000 Units by mouth daily.    . ciprofloxacin (CIPRO) 250 MG tablet Take 250 mg by mouth 2 (two) times daily.    . Cranberry-Vitamin C-Vitamin E (CRANBERRY PLUS VITAMIN C) 140-100-3 MG-MG-UNIT CAPS Take 2,000 Units by mouth every morning.     . ezetimibe (ZETIA) 10 MG tablet Take 10 mg by mouth at bedtime.     . folic acid (FOLVITE) A999333 MCG tablet Take 400 mcg by mouth daily.    Marland Kitchen labetalol (NORMODYNE) 200 MG tablet Take 300 mg by mouth 2 (two) times daily. Take 1 1/2 tablets twice daily    . lactose free nutrition (BOOST) LIQD Take 237 mLs by mouth 3 (three) times daily as needed (meal replacement).    Marland Kitchen levothyroxine (SYNTHROID, LEVOTHROID) 50 MCG tablet Take 50 mcg by mouth daily before breakfast.    . lisinopril (PRINIVIL,ZESTRIL) 40 MG tablet Take 40 mg by mouth at bedtime.    . Omega-3 Fatty Acids (FISH OIL) 1000 MG CAPS Take 1,000 mg by mouth 2 (two) times daily.    . polyethylene glycol (MIRALAX / GLYCOLAX) packet Take 17 g by mouth daily as needed for mild constipation. 14 each 0  . trimethoprim (TRIMPEX) 100 MG tablet Take 100 mg by mouth daily.       Home: Home Living Family/patient expects to be discharged to:: Inpatient rehab Living Arrangements: Children Available Help at Discharge: Family Type of Home: House Home Access: [place]: One level Bathroom Shower/Tub: Gaffer, Research officer, trade union Toilet: Handicapped height [place]: Environmental consultant - 2 wheels, [place] - single point, Wheelchair - manual, Grab bars - toilet,  Grab bars - tub/shower, Shower seat - built in Additional Comments: encouraged pt to be using Lt UE functionally.  Functional History: Prior Function Level of Independence: Independent Functional Status:  Mobility: Bed Mobility Overal bed mobility: Modified Independent Bed Mobility: Supine to Sit Supine to sit: Supervision, HOB elevated General bed mobility comments: use of rail; assist for safety Transfers Overall transfer level: Needs assistance Equipment used: Rolling walker (2 wheeled) Transfers: Sit to/from Stand Sit to Stand: Supervision General transfer comment: RW in front of pt Ambulation/Gait Ambulation/Gait assistance: Min assist Ambulation Distance (Feet): 200 Feet Assistive device: Rolling walker (2 wheeled) Gait Pattern/deviations: Step-through pattern, Step-to pattern, Shuffle, Decreased step length - left, Decreased dorsiflexion - left General Gait Details: difficutly managing walker due to L grip weakness, noting imbalance with L LE weakness increased with fatigue and dragging L foot on floor    ADL: ADL Overall ADL's : Needs assistance/impaired Grooming: Sitting, Supervision/safety, Applying deodorant, Brushing hair, Set up Lower Body Dressing: Minimal assistance, Sit to/from stand Toilet Transfer: RW, Min guard, Ambulation (sit to stand from bed) Functional mobility during ADLs: Min guard, Rolling walker (ambulated with and without RW) General ADL Comments: Encouraged pt to be using Lt hand.   Cognition: Cognition Overall Cognitive Status: Within Functional Limits for tasks assessed Orientation Level: Oriented X4 Cognition Arousal/Alertness: Awake/alert (became awake in session) Behavior During Therapy: [facility] for tasks assessed/performed  Overall Cognitive Status: Within Functional Limits for tasks assessed  Blood pressure 136/79, pulse 65, temperature 98.2 F (36.8 C), temperature source Oral, resp. rate 18, SpO2 96 %. Physical Exam  Vitals  reviewed. Constitutional: She is oriented to person, place, and time. She appears well-developed and well-nourished.  HENT:  Head: Normocephalic and atraumatic.  Eyes: Conjunctivae and EOM are normal.  Neck: Normal range of motion. Neck supple. No thyromegaly present.  Cardiovascular: Normal rate and regular rhythm.  Respiratory: Effort normal and breath sounds normal. No respiratory distress.  GI: Soft. Bowel sounds are normal. She exhibits no distension.  Musculoskeletal: She exhibits no edema or tenderness.  Neurological: She is alert and oriented to person, place, and time.  Makes good eye contact with examiner.  Follows full commands.  Fair awareness of deficits. Sensation intact to light touch DTRs symmetric Motor: Left facial weakness RUE/RLE: 5/5 proximal to distal LUE: 4+/5 proximal to distal, decreased FMC LLE: 4/5 proximal, 4+/5 distally  Skin: Skin is warm and dry.  Psychiatric: She has a normal mood and affect. Her behavior is normal.    Lab Results Last 24 Hours    No results found for this or any previous visit (from the past 24 hour(s)).    Imaging Results (Last 48 hours)    No results found.    Assessment/Plan: Diagnosis: Propagation of right MCA territory acute Infarcts Labs and images independently reviewed. Records reviewed and summated above. Stroke: Continue secondary stroke prophylaxis and Risk Factor Modification listed below:  Antiplatelet therapy:  Blood Pressure Management: Continue current medication with prn's with permisive HTN per primary team Statin Agent:  Left sided hemiparesis:  Motor recovery: Fluoxetine  1. Does the need for close, 24 hr/day medical supervision in concert with the patient's rehab needs make it unreasonable for this patient to be served in a less intensive setting? Yes 2. Co-Morbidities requiring supervision/potential complications: hyperlipidemia (cont meds), recurrent UTIs (cont abx), hypothyroidism (cont  meds and monitor with energy and mood to ensure energy for therapies), HTN (monitor and provide prns in accordance with increased physical exertion and pain), ABLA (transfuse if necessary to ensure appropriate perfusion for increased activity tolerance) 3. Due to safety, disease management, medication administration and patient education, does the patient require 24 hr/day rehab nursing? Yes 4. Does the patient require coordinated care of a physician, rehab nurse, PT (1-2 hrs/day, 5 days/week), OT (1-2 hrs/day, 5 days/week) and SLP (1-2 hrs/day, 5 days/week) to address physical and functional deficits in the context of the above medical diagnosis(es)? Yes Addressing deficits in the following areas: balance, endurance, locomotion, strength, transferring, dressing, toileting, speech and psychosocial support 5. Can the patient actively participate in an intensive therapy program of at least 3 hrs of therapy per day at least 5 days per week? Yes 6. The potential for patient to make measurable gains while on inpatient rehab is good 7. Anticipated functional outcomes upon discharge from inpatient rehab are modified independent with PT, modified independent with OT, independent and modified independent with SLP. 8. Estimated rehab length of stay to reach the above functional goals is: 8-12 days. 9. Does the patient have adequate social supports and living environment to accommodate these discharge functional goals? Yes 10. Anticipated D/C setting: Home 11. Anticipated post D/C treatments: HH therapy and Home excercise program 12. Overall Rehab/Functional Prognosis: good  RECOMMENDATIONS: This patient's condition is appropriate for continued rehabilitative care in the following setting: CIR Patient has agreed to participate in recommended program. Yes Note that insurance  prior authorization may be required for reimbursement for recommended care.  Comment: Rehab Admissions Coordinator to follow  up.  Delice Lesch, MD 01/23/2016       Revision History     Date/Time User Provider Type Action   01/23/2016 12:42 PM Ankit Lorie Phenix, MD Physician Sign   01/23/2016 6:43 AM Cathlyn Parsons, PA-C Physician Assistant Pend   View Details Report       Routing History     Date/Time From To Method   01/23/2016 12:42 PM Ankit Lorie Phenix, MD Nicoletta Dress, MD Fax

## 2016-01-23 NOTE — PMR Pre-admission (Signed)
PMR Admission Coordinator Pre-Admission Assessment  Patient: [name] is an 82 y.o., female MRN: [id number] DOB: [birth year] Height:   Weight:                Insurance Information HMO:     PPO:      PCP:     IPA:      80/20: yes     OTHER: no HMO PRIMARY: Medicare a and b      Policy#: [id number]      Subscriber: pt Benefits:  Phone #: passport one     Name: 01/23/16 Eff. 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 right handed female with history of hypertension, hyperlipidemia, recurrent UTIs. Patient with history of CVA with resultant left-sided weakness and slurred speech one week ago treated at [facility] maintained on Plavix. She had stopped taking her Plavix for reasons that were unclear. She lives with her son was independent prior to admission.  Presented 01/19/2016 presenting with increasing left hand weakness and a fall in the bathroom. MRI showed mild global propagation of right MCA territory acute small infarcts. MRA of the head positive for posterior right MCA M2 branch occlusion. Neurology consulted presently on aspirin and Plavix for CVA prophylaxis. Subcutaneous heparin for DVT prophylaxis. Urine culture greater than 100,000 Escherichia coli maintained on Rocephin. Tolerating a regular diet.    Total: 3 NIH    Past Medical  History  Past Medical History  Diagnosis Date  . Hypertension   . High cholesterol   . Stroke [facility]) 1990's    "mild", denies residual on 01/28/2014  . Pneumonia     "maybe in my teens"  . Hypothyroidism   . Anemia   . History of blood transfusion     "S/P knee OR"  . Arthritis     "was in my knees"  . Recurrent UTI (urinary tract infection)     Family History  family history includes Hypertension in her son.  Prior Rehab/Hospitalizations:  Has the patient had major surgery during 100 days prior to admission? No Has been to [facility] 3 times in past after knee replacements  Current Medications   Current facility-administered medications:  .  acetaminophen (TYLENOL) tablet 650 mg, 650 mg, Oral, Q6H PRN, 650 mg at 01/20/16 0902 **OR** [DISCONTINUED] acetaminophen (TYLENOL) suppository 650 mg, 650 mg, Rectal, Q6H PRN, Ozella Rocks, MD .  [DISCONTINUED] aspirin suppository 300 mg, 300 mg, Rectal, Daily **OR** aspirin tablet 325 mg, 325 mg, Oral, Daily, Ozella Rocks, MD, 325 mg at 01/23/16 0914 .  clonazePAM (KLONOPIN) tablet 0.5 mg, 0.5 mg, Oral,  Daily, Leroy Sea, MD, 0.5 mg at 01/23/16 0915 .  clopidogrel (PLAVIX) tablet 75 mg, 75 mg, Oral, Daily, Ozella Rocks, MD, 75 mg at 01/23/16 0914 .  ezetimibe (ZETIA) tablet 10 mg, 10 mg, Oral, QHS, Ozella Rocks, MD, 10 mg at 01/22/16 2108 .  gi cocktail (Maalox,Lidocaine,Donnatal), 30 mL, Oral, Once, Leanne Chang, NP, Stopped at 01/21/16 0252 .  heparin injection 5,000 Units, 5,000 Units, Subcutaneous, Q8H, Ozella Rocks, MD, 5,000 Units at 01/23/16 0600 .  labetalol (NORMODYNE) tablet 300 mg, 300 mg, Oral, BID, Maretta Bees, MD, 300 mg at 01/23/16 0915 .  lactose free nutrition (Boost) liquid 237 mL, 237 mL, Oral, TID PRN, Ozella Rocks, MD .  levothyroxine (SYNTHROID, LEVOTHROID) tablet 50 mcg, 50 mcg, Oral, QAC breakfast, Ozella Rocks, MD, 50 mcg at 01/23/16 0915 .  lisinopril (PRINIVIL,ZESTRIL) tablet  40 mg, 40 mg, Oral, QHS, Maretta Bees, MD, 40 mg at 01/22/16 2108 .  [DISCONTINUED] ondansetron (ZOFRAN) tablet 4 mg, 4 mg, Oral, Q6H PRN **OR** ondansetron (ZOFRAN) injection 4 mg, 4 mg, Intravenous, Q6H PRN, Ozella Rocks, MD .  oxyCODONE (Oxy IR/ROXICODONE) immediate release tablet 5 mg, 5 mg, Oral, Q4H PRN, Ozella Rocks, MD .  pravastatin (PRAVACHOL) tablet 20 mg, 20 mg, Oral, Daily, Ozella Rocks, MD, 20 mg at 01/23/16 0915 .  sodium chloride flush (NS) 0.9 % injection 3 mL, 3 mL, Intravenous, Q12H, Ozella Rocks, MD, 10 mL at 01/22/16 2109  Patients Current Diet: Diet Heart Room service appropriate?: Yes; Fluid consistency:: Thin Diet - low sodium heart healthy  Precautions / Restrictions Precautions Precautions: Fall Restrictions Weight Bearing Restrictions: No   Has the patient had 2 or more falls or a fall with injury in the past year?No  Prior Activity Level Limited Community (1-2x/wk): drives, no AD, active and independent  Journalist, newspaper / Equipment Home Assistive Devices/Equipment: Medical laboratory scientific officer (specify quad or straight), Environmental consultant (specify type), Wheelchair Home Equipment: Environmental consultant - 2 wheels, [place] - single point, Wheelchair - manual, Grab bars - toilet, Grab bars - tub/shower, Shower seat - built in  Prior Device Use: Indicate devices/aids used by the patient prior to current illness, exacerbation or injury? None of the above  Prior Functional Level Prior Function Level of Independence: Independent  Self Care: Did the patient need help bathing, dressing, using the toilet or eating?  Independent  Indoor Mobility: Did the patient need assistance with walking from room to room (with or without device)? Independent  Stairs: Did the patient need assistance with internal or external stairs (with or without device)? Independent  Functional Cognition: Did the patient need help planning regular tasks such as shopping or remembering to take medications?  Independent  Current Functional Level Cognition  Overall Cognitive Status: Within Functional Limits for tasks assessed Orientation Level: Oriented X4    Extremity Assessment (includes Sensation/Coordination)  Upper Extremity Assessment: LUE deficits/detail, RUE deficits/detail RUE Deficits / Details: weakness in shoulder flexors LUE Deficits / Details: decreased grip strength; decreased strength in shoulder flexors (left weaker than right) and elbow flexors LUE Sensation: decreased light touch LUE Coordination: decreased fine motor, decreased gross motor  Lower Extremity Assessment: Defer to PT evaluation LLE Deficits / Details: AROM WFL, strength grossly WFL, but functional weakness noted with ambulation     ADLs  Overall ADL's : Needs assistance/impaired Grooming: Sitting, Supervision/safety, Applying deodorant, Brushing hair, Set up Lower Body Dressing: Minimal assistance, Sit to/from stand Toilet Transfer: RW, Min guard,  Ambulation (sit to stand from bed) Functional mobility during ADLs: Min guard, Rolling walker (ambulated with and without RW) General ADL Comments: Encouraged pt to be using Lt hand.     Mobility  Overal bed mobility: Modified Independent Bed Mobility: Sit to Supine Supine to sit: Supervision, HOB elevated Sit to supine: Supervision General bed mobility comments: cues for positioning    Transfers  Overall transfer level: Needs assistance Equipment used: None Transfers: Sit to/from Stand Sit to Stand: Min guard General transfer comment: for balance    Ambulation / Gait / Stairs / Wheelchair Mobility  Ambulation/Gait Ambulation/Gait assistance: Architect (Feet): 150 Feet Assistive device: None Gait Pattern/deviations: Step-through pattern, Decreased stride length, Decreased stance time - left, Decreased dorsiflexion - left, Decreased step length - right, Narrow base of support General Gait Details: managed without walker for entire  session, but given hand hold assist after balance activities in hallway due to fatigue; cues for L heel strike and rollover to toes;     Posture / Balance Balance Overall balance assessment: Needs assistance Sitting balance-Leahy Scale: Good Standing balance support: No upper extremity supported Standing balance-Leahy Scale: Fair Standing balance comment: standing without UE support close S High Level Balance Comments: in hallway at wall rail side stepping no UE support (min A), crossing over with UE support, forward march one UE support, forward tandem bilat UE support    Special needs/care consideration  Skin intact Bowel mgmt: LBM 5/12 continent Bladder mgmt: continent Diabetic mgmt Hgb A1c 5.3   Previous Home Environment Living Arrangements:  (son, Tresa Endo , lives with pt)  Lives With: Son (son lives with pt) Available Help at Discharge: Family Type of Home: House Home Layout: One level Home Access: Ramped entrance Bathroom Shower/Tub: Walk-in shower, Nurse, adult Toilet: Handicapped height Bathroom Accessibility: Yes How Accessible: Accessible via walker Home Care Services: No Additional Comments: encouraged pt to be using Lt UE functionally.  Discharge Living Setting Plans for Discharge Living Setting: Patient's home, Lives with (comment) (son, Tresa Endo lives with pt) Type of Home at Discharge: House Discharge Home Layout: One level Discharge Home Access: Ramped entrance Discharge Bathroom Shower/Tub: Tub/shower unit, Walk-in shower Discharge Bathroom Toilet: Handicapped height Discharge Bathroom Accessibility: Yes How Accessible: Accessible via walker Does the patient have any problems obtaining your medications?: No  Social/Family/Support Systems Patient Roles: Parent Contact Information: Darrol Angel, son Anticipated Caregiver: son supervision assist if needed Anticipated Caregiver's Contact Information: see above Ability/Limitations of Caregiver: son,  Tresa Endo Caregiver Availability: 24/7 Discharge Plan Discussed with Primary Caregiver: Yes Is Caregiver In Agreement with Plan?: Yes Does Caregiver/Family have Issues with Lodging/Transportation while Pt is in Rehab?: No  Goals/Additional Needs Patient/Family Goal for Rehab: Mod I with PT, OT, and SLP Expected length of stay: [facility]OS 8-12 days Pt/Family Agrees to Admission and willing to participate: Yes Program Orientation Provided & Reviewed with Pt/Caregiver Including Roles  & Responsibilities: Yes  Decrease burden of Care through IP rehab admission: n/a  Possible need for SNF placement upon discharge: not anticipated  Patient Condition: This patient's condition remains as documented in the consult dated 01/23/2016, in which the Rehabilitation Physician determined and documented that the patient's condition is appropriate for intensive rehabilitative care in an inpatient rehabilitation facility. Will admit to inpatient rehab today.  Preadmission Screen Completed By:  Clois Dupes, 01/23/2016 1:55 PM ______________________________________________________________________   Discussed status with Dr. Allena Katz on 01/23/2016 at 1353 and received telephone approval for admission today.  Admission Coordinator:  Ottie Glazier  Courtney Paris, time 1353 Date 01/23/2016.

## 2016-01-23 NOTE — Progress Notes (Signed)
Pt to transfer to CIR. Telemetry removed, pt belongings gathered. IV to remain per CIR RN. Pt aware of transfer and is agreeable. Wendee Copp

## 2016-01-23 NOTE — Progress Notes (Signed)
I met with pt at bedside to discuss an inpt rehab admission. Pt is in agreement. I contacted Dr. Candiss Norse and he is in agreement to d/c today to CIR. I will alert RN CM and SW and make the arrangements to admit today. [phone]

## 2016-01-23 NOTE — Progress Notes (Signed)
Ankit Lorie Phenix, MD Physician Signed Physical Medicine and Rehabilitation Consult Note 01/23/2016 5:54 AM  Related encounter: ED to Hosp-Admission (Current) from 01/19/2016 in [place] Collapse All        Physical Medicine and Rehabilitation Consult Reason for Consult: Propagation of right MCA territory acute infarcts Referring Physician: [facility]  HPI: [name] is a 82 y.o. right handed female with history of hypertension, hyperlipidemia, recurrent UTIs. Patient with history of CVA with resultant left-sided weakness and slurred speech one week ago treated at [facility] maintained on Plavix. She had stopped taking her Plavix for reasons that were unclear. She lives with her son was independent prior to admission. Son recently had back surgery and can provide limited physical assistance. Presented 01/19/2016 presenting with increasing left hand weakness and a fall in the bathroom. MRI showed mild global propagation of right MCA territory acute small infarcts. MRA of the head positive for posterior right MCA M2 branch occlusion. Neurology consulted presently on aspirin and Plavix for CVA prophylaxis. Subcutaneous heparin for DVT prophylaxis. Urine culture greater than 100,000 Escherichia coli maintained on Rocephin. Tolerating a regular diet. Physical and occupational therapy evaluations completed with recommendations of physical medicine rehabilitation consult.   Review of Systems  Constitutional: Negative for fever and chills.  HENT: Negative for hearing loss.  Eyes: Negative for blurred vision and double vision.  Respiratory: Negative for cough and shortness of breath.  Cardiovascular: Positive for leg swelling. Negative for chest pain and palpitations.  Gastrointestinal: Positive for constipation. Negative for nausea and vomiting.  Genitourinary: Positive for urgency. Negative for dysuria and hematuria.  Musculoskeletal:  Positive for myalgias and joint pain.  Neurological: Positive for speech change and weakness. Negative for seizures and headaches.  All other systems reviewed and are negative.  Past Medical History  Diagnosis Date  . Hypertension   . High cholesterol   . Stroke [facility]) 1990's    "mild", denies residual on 01/28/2014  . Pneumonia     "maybe in my teens"  . Hypothyroidism   . Anemia   . History of blood transfusion     "S/P knee OR"  . Arthritis     "was in my knees"  . Recurrent UTI (urinary tract infection)    Past Surgical History  Procedure Laterality Date  . Muscle biopsy Left 1990's    "knot biopsy from carrying mail bag for years"  . Bladder suspension  2012  . Splenic artery embolization  01/28/2014  . Joint replacement    . Total knee arthroplasty Bilateral 2012  . Vaginal hysterectomy    . Revison of arteriovenous fistula Right 01/31/2014    Procedure: REPAIR PSEUDOANEURYSM; Surgeon: Serafina Mitchell, MD; Location: [facility] OR; Service: Vascular; Laterality: Right; Repair of Femoral Pseudoaneurysm.  . Femur im nail Right 03/23/2014    Procedure: INTRAMEDULLARY (IM) NAIL FEMORAL; Surgeon: Mauri Pole, MD; Location: [facility] ORS; Service: Orthopedics; Laterality: Right;  . Visceral angiogram N/A 01/28/2014    Procedure: VISCERAL ANGIOGRAM; Surgeon: Conrad Chaffee, MD; Location: [facility] CATH LAB; Service: Cardiovascular; Laterality: N/A;  . Embolization N/A 01/28/2014    Procedure: EMBOLIZATION; Surgeon: Conrad [place], MD; Location: [facility] CATH LAB; Service: Cardiovascular; Laterality: N/A; Splenic Artery   Family History  Problem Relation Age of Onset  . Hypertension Son    Social History:  reports that she has never smoked. She has never used smokeless tobacco. She reports that she does not drink alcohol  or use illicit drugs. Allergies:  Allergies  Allergen  Reactions  . Aspirin Other (See Comments)    Causes blood in urine   Medications Prior to Admission  Medication Sig Dispense Refill  . amLODipine (NORVASC) 5 MG tablet Take 5 mg by mouth at bedtime.    . Calcium Carbonate-Vitamin D (CALCIUM 500 + D PO) Take 500 mg by mouth 2 (two) times daily.    . cholecalciferol (VITAMIN D) 1000 UNITS tablet Take 2,000 Units by mouth daily.    . ciprofloxacin (CIPRO) 250 MG tablet Take 250 mg by mouth 2 (two) times daily.    . Cranberry-Vitamin C-Vitamin E (CRANBERRY PLUS VITAMIN C) 140-100-3 MG-MG-UNIT CAPS Take 2,000 Units by mouth every morning.     . ezetimibe (ZETIA) 10 MG tablet Take 10 mg by mouth at bedtime.     . folic acid (FOLVITE) A999333 MCG tablet Take 400 mcg by mouth daily.    Marland Kitchen labetalol (NORMODYNE) 200 MG tablet Take 300 mg by mouth 2 (two) times daily. Take 1 1/2 tablets twice daily    . lactose free nutrition (BOOST) LIQD Take 237 mLs by mouth 3 (three) times daily as needed (meal replacement).    Marland Kitchen levothyroxine (SYNTHROID, LEVOTHROID) 50 MCG tablet Take 50 mcg by mouth daily before breakfast.    . lisinopril (PRINIVIL,ZESTRIL) 40 MG tablet Take 40 mg by mouth at bedtime.    . Omega-3 Fatty Acids (FISH OIL) 1000 MG CAPS Take 1,000 mg by mouth 2 (two) times daily.    . polyethylene glycol (MIRALAX / GLYCOLAX) packet Take 17 g by mouth daily as needed for mild constipation. 14 each 0  . trimethoprim (TRIMPEX) 100 MG tablet Take 100 mg by mouth daily.       Home: Home Living Family/patient expects to be discharged to:: Inpatient rehab Living Arrangements: Children Available Help at Discharge: Family Type of Home: House Home Access: [place]: One level Bathroom Shower/Tub: Gaffer, Research officer, trade union Toilet: Handicapped height Bowlegs: Environmental consultant - 2 wheels, Julian - single point, Wheelchair - manual, Grab bars - toilet,  Grab bars - tub/shower, Shower seat - built in Additional Comments: encouraged pt to be using Lt UE functionally.  Functional History: Prior Function Level of Independence: Independent Functional Status:  Mobility: Bed Mobility Overal bed mobility: Modified Independent Bed Mobility: Supine to Sit Supine to sit: Supervision, HOB elevated General bed mobility comments: use of rail; assist for safety Transfers Overall transfer level: Needs assistance Equipment used: Rolling walker (2 wheeled) Transfers: Sit to/from Stand Sit to Stand: Supervision General transfer comment: RW in front of pt Ambulation/Gait Ambulation/Gait assistance: Min assist Ambulation Distance (Feet): 200 Feet Assistive device: Rolling walker (2 wheeled) Gait Pattern/deviations: Step-through pattern, Step-to pattern, Shuffle, Decreased step length - left, Decreased dorsiflexion - left General Gait Details: difficutly managing walker due to L grip weakness, noting imbalance with L LE weakness increased with fatigue and dragging L foot on floor    ADL: ADL Overall ADL's : Needs assistance/impaired Grooming: Sitting, Supervision/safety, Applying deodorant, Brushing hair, Set up Lower Body Dressing: Minimal assistance, Sit to/from stand Toilet Transfer: RW, Min guard, Ambulation (sit to stand from bed) Functional mobility during ADLs: Min guard, Rolling walker (ambulated with and without RW) General ADL Comments: Encouraged pt to be using Lt hand.   Cognition: Cognition Overall Cognitive Status: Within Functional Limits for tasks assessed Orientation Level: Oriented X4 Cognition Arousal/Alertness: Awake/alert (became awake in session) Behavior During Therapy: [facility] for tasks assessed/performed  Overall Cognitive Status: Within Functional Limits for tasks assessed  Blood pressure 136/79, pulse 65, temperature 98.2 F (36.8 C), temperature source Oral, resp. rate 18, SpO2 96 %. Physical Exam  Vitals  reviewed. Constitutional: She is oriented to person, place, and time. She appears well-developed and well-nourished.  HENT:  Head: Normocephalic and atraumatic.  Eyes: Conjunctivae and EOM are normal.  Neck: Normal range of motion. Neck supple. No thyromegaly present.  Cardiovascular: Normal rate and regular rhythm.  Respiratory: Effort normal and breath sounds normal. No respiratory distress.  GI: Soft. Bowel sounds are normal. She exhibits no distension.  Musculoskeletal: She exhibits no edema or tenderness.  Neurological: She is alert and oriented to person, place, and time.  Makes good eye contact with examiner.  Follows full commands.  Fair awareness of deficits. Sensation intact to light touch DTRs symmetric Motor: Left facial weakness RUE/RLE: 5/5 proximal to distal LUE: 4+/5 proximal to distal, decreased FMC LLE: 4/5 proximal, 4+/5 distally  Skin: Skin is warm and dry.  Psychiatric: She has a normal mood and affect. Her behavior is normal.    Lab Results Last 24 Hours    No results found for this or any previous visit (from the past 24 hour(s)).    Imaging Results (Last 48 hours)    No results found.    Assessment/Plan: Diagnosis: Propagation of right MCA territory acute Infarcts Labs and images independently reviewed. Records reviewed and summated above. Stroke: Continue secondary stroke prophylaxis and Risk Factor Modification listed below:  Antiplatelet therapy:  Blood Pressure Management: Continue current medication with prn's with permisive HTN per primary team Statin Agent:  Left sided hemiparesis:  Motor recovery: Fluoxetine  1. Does the need for close, 24 hr/day medical supervision in concert with the patient's rehab needs make it unreasonable for this patient to be served in a less intensive setting? Yes 2. Co-Morbidities requiring supervision/potential complications: hyperlipidemia (cont meds), recurrent UTIs (cont abx), hypothyroidism (cont  meds and monitor with energy and mood to ensure energy for therapies), HTN (monitor and provide prns in accordance with increased physical exertion and pain), ABLA (transfuse if necessary to ensure appropriate perfusion for increased activity tolerance) 3. Due to safety, disease management, medication administration and patient education, does the patient require 24 hr/day rehab nursing? Yes 4. Does the patient require coordinated care of a physician, rehab nurse, PT (1-2 hrs/day, 5 days/week), OT (1-2 hrs/day, 5 days/week) and SLP (1-2 hrs/day, 5 days/week) to address physical and functional deficits in the context of the above medical diagnosis(es)? Yes Addressing deficits in the following areas: balance, endurance, locomotion, strength, transferring, dressing, toileting, speech and psychosocial support 5. Can the patient actively participate in an intensive therapy program of at least 3 hrs of therapy per day at least 5 days per week? Yes 6. The potential for patient to make measurable gains while on inpatient rehab is good 7. Anticipated functional outcomes upon discharge from inpatient rehab are modified independent with PT, modified independent with OT, independent and modified independent with SLP. 8. Estimated rehab length of stay to reach the above functional goals is: 8-12 days. 9. Does the patient have adequate social supports and living environment to accommodate these discharge functional goals? Yes 10. Anticipated D/C setting: Home 11. Anticipated post D/C treatments: HH therapy and Home excercise program 12. Overall Rehab/Functional Prognosis: good  RECOMMENDATIONS: This patient's condition is appropriate for continued rehabilitative care in the following setting: CIR Patient has agreed to participate in recommended program. Yes Note that insurance  prior authorization may be required for reimbursement for recommended care.  Comment: Rehab Admissions Coordinator to follow  up.  Delice Lesch, MD 01/23/2016       Revision History     Date/Time User Provider Type Action   01/23/2016 12:42 PM Ankit Lorie Phenix, MD Physician Sign   01/23/2016 6:43 AM Cathlyn Parsons, PA-C Physician Assistant Pend   View Details Report       Routing History     Date/Time From To Method   01/23/2016 12:42 PM Ankit Lorie Phenix, MD Nicoletta Dress, MD Fax

## 2016-01-23 NOTE — H&P (View-Only) (Signed)
Physical Medicine and Rehabilitation Admission H&P    Chief Complaint  Patient presents with  . Worsening of left sided weakness and left facial weakness.     HPI: [name] is a 82 y.o. RH-female with history of HTN, OA, recurrent UTIs,  CVA with left sided weakness with numbness and slurred speech at [facility] a week PTA with persistent left sided symptoms. She sustained a fall and struck her head on the toilet and was admitted for work up.   She had been off plavix for a week for unclear reasons--question discontinued at discharged v/s stopped taking it.   MRI brain showed mild local propagation of RIGHT MCA territory acute small infarcts and positive for moderate to severe stenosis  bilateral PCA P2 segments and L-VA distal segment and posterior R-MCA M2 occlusion 35mm beyond its orign. Carotid dopplers without significant stenosis. 2 D echo with EF 60-65% with mild concentric LVH.   Dr. Erlinda Hong recommended ASA/plavix for 3 months followed by plavix. She is to follow up with [facility] heart care for 30 day event monitor to rule out A fib due to  M2 occlusion and avoid hypotension with SBP 130-150 range to allow for perfusion.    She was found to have E coli UTI and placed on rocephin for treatment. Patient with resultant mild to moderate dysarthria, left sided weakness with decreased BOS as well as difficulty with ADL tasks. CIR recommended by rehab team for follow up therapy.     Review of Systems  HENT: Negative for hearing loss.   Eyes: Negative for blurred vision and double vision.  Respiratory: Negative for cough, shortness of breath and wheezing.   Cardiovascular: Negative for chest pain and palpitations.  Gastrointestinal: Negative for heartburn, nausea and abdominal pain.  Genitourinary: Positive for frequency (urinates every 2 hours--sees Dr. Gerlene Burdock in [place]).  Musculoskeletal: Negative for myalgias and neck pain.  Skin: Negative for rash.  Neurological: Positive for speech change,  focal weakness and weakness. Negative for dizziness, tingling and headaches.  Psychiatric/Behavioral: Negative for memory loss. The patient is nervous/anxious.   All other systems reviewed and are negative.     Past Medical History  Diagnosis Date  . Hypertension   . High cholesterol   . Stroke [facility]) 1990's    "mild", denies residual on 01/28/2014  . Pneumonia     "maybe in my teens"  . Hypothyroidism   . Anemia   . History of blood transfusion     "S/P knee OR"  . Arthritis     "was in my knees"  . Recurrent UTI (urinary tract infection)     Past Surgical History  Procedure Laterality Date  . Muscle biopsy Left 1990's    "knot biopsy from carrying mail bag for years"  . Bladder suspension  2012  . Splenic artery embolization  01/28/2014  . Joint replacement    . Total knee arthroplasty Bilateral 2012  . Vaginal hysterectomy    . Revison of arteriovenous fistula Right 01/31/2014    Procedure: REPAIR PSEUDOANEURYSM;  Surgeon: Serafina Mitchell, MD;  Location: [facility] OR;  Service: Vascular;  Laterality: Right;  Repair of Femoral Pseudoaneurysm.  . Femur im nail Right 03/23/2014    Procedure: INTRAMEDULLARY (IM) NAIL FEMORAL;  Surgeon: Mauri Pole, MD;  Location: [facility] ORS;  Service: Orthopedics;  Laterality: Right;  . Visceral angiogram N/A 01/28/2014    Procedure: VISCERAL ANGIOGRAM;  Surgeon: Conrad Fairwood, MD;  Location: [facility] CATH LAB;  Service:  Cardiovascular;  Laterality: N/A;  . Embolization N/A 01/28/2014    Procedure: EMBOLIZATION;  Surgeon: Conrad Coeburn, MD;  Location: [facility] CATH LAB;  Service: Cardiovascular;  Laterality: N/A;  Splenic Artery    Family History  Problem Relation Age of Onset  . Hypertension Son     Social History:  Widowed. Son live with her and recently had surgery. She reports that she has never smoked. She has never used smokeless tobacco. She reports that she does not drink alcohol or use illicit drugs.    Allergies  Allergen Reactions  . Aspirin Other  (See Comments)    Causes blood in urine    Medications Prior to Admission  Medication Sig Dispense Refill  . amLODipine (NORVASC) 5 MG tablet Take 5 mg by mouth at bedtime.    . Calcium Carbonate-Vitamin D (CALCIUM 500 + D PO) Take 500 mg by mouth 2 (two) times daily.    . cholecalciferol (VITAMIN D) 1000 UNITS tablet Take 2,000 Units by mouth daily.    . ciprofloxacin (CIPRO) 250 MG tablet Take 250 mg by mouth 2 (two) times daily.    . Cranberry-Vitamin C-Vitamin E (CRANBERRY PLUS VITAMIN C) 140-100-3 MG-MG-UNIT CAPS Take 2,000 Units by mouth every morning.     . ezetimibe (ZETIA) 10 MG tablet Take 10 mg by mouth at bedtime.     . folic acid (FOLVITE) A999333 MCG tablet Take 400 mcg by mouth daily.    Marland Kitchen labetalol (NORMODYNE) 200 MG tablet Take 300 mg by mouth 2 (two) times daily. Take 1 1/2 tablets twice daily    . lactose free nutrition (BOOST) LIQD Take 237 mLs by mouth 3 (three) times daily as needed (meal replacement).    Marland Kitchen levothyroxine (SYNTHROID, LEVOTHROID) 50 MCG tablet Take 50 mcg by mouth daily before breakfast.    . lisinopril (PRINIVIL,ZESTRIL) 40 MG tablet Take 40 mg by mouth at bedtime.    . Omega-3 Fatty Acids (FISH OIL) 1000 MG CAPS Take 1,000 mg by mouth 2 (two) times daily.    . polyethylene glycol (MIRALAX / GLYCOLAX) packet Take 17 g by mouth daily as needed for mild constipation. 14 each 0  . trimethoprim (TRIMPEX) 100 MG tablet Take 100 mg by mouth daily.       Home: Home Living Family/patient expects to be discharged to:: Inpatient rehab Living Arrangements: Children Available Help at Discharge: Family Type of Home: House Home Access: [place]: One level Bathroom Shower/Tub: Gaffer, Research officer, trade union Toilet: Handicapped height [place]: Environmental consultant - 2 wheels, [place] - single point, Wheelchair - manual, Grab bars - toilet, Grab bars - tub/shower, Shower seat - built in Additional Comments: encouraged pt to be using Lt UE  functionally.   Functional History: Prior Function Level of Independence: Independent  Functional Status:  Mobility: Bed Mobility Overal bed mobility: Modified Independent Bed Mobility: Sit to Supine Supine to sit: Supervision, HOB elevated Sit to supine: Supervision General bed mobility comments: cues for positioning Transfers Overall transfer level: Needs assistance Equipment used: None Transfers: Sit to/from Stand Sit to Stand: Min guard General transfer comment: for balance Ambulation/Gait Ambulation/Gait assistance: Min assist Ambulation Distance (Feet): 150 Feet Assistive device: None Gait Pattern/deviations: Step-through pattern, Decreased stride length, Decreased stance time - left, Decreased dorsiflexion - left, Decreased step length - right, Narrow base of support General Gait Details: managed without walker for entire session, but given hand hold assist after balance activities in hallway due to fatigue; cues for L heel  strike and rollover to toes;     ADL: ADL Overall ADL's : Needs assistance/impaired Grooming: Sitting, Supervision/safety, Applying deodorant, Brushing hair, Set up Lower Body Dressing: Minimal assistance, Sit to/from stand Toilet Transfer: RW, Min guard, Ambulation (sit to stand from bed) Functional mobility during ADLs: Min guard, Rolling walker (ambulated with and without RW) General ADL Comments: Encouraged pt to be using Lt hand.   Cognition: Cognition Overall Cognitive Status: Within Functional Limits for tasks assessed Orientation Level: Oriented X4 Cognition Arousal/Alertness: Awake/alert Behavior During Therapy: WFL for tasks assessed/performed Overall Cognitive Status: Within Functional Limits for tasks assessed   Blood pressure 129/67, pulse 62, temperature 98.7 F (37.1 C), temperature source Oral, resp. rate 20, SpO2 96 %. Physical Exam  Nursing note and vitals reviewed. Constitutional: She is oriented to person, place, and  time. She appears well-developed and well-nourished.  HENT:  Head: Normocephalic and atraumatic.  Mouth/Throat: Oropharynx is clear and moist.  Eyes: Conjunctivae are normal. Pupils are equal, round, and reactive to light.  Neck: Normal range of motion. Neck supple.  Cardiovascular: Normal rate and regular rhythm.   Murmur heard. Respiratory: Effort normal and breath sounds normal. No respiratory distress. She has no wheezes.  GI: Soft. Bowel sounds are normal. She exhibits no distension. There is no tenderness.  Musculoskeletal: She exhibits no edema or tenderness.  Neurological: She is alert and oriented to person, place, and time.  Left facial weakness with mild to moderate dysarthria.  Mild left inattention?  Able to follow basic one and two step commands without difficulty.  DTRs symmetric Sensation intact to light touch RUE/RLE: 5/5 proximal to distal LUE: 4+/5 proximal to distal, decreased FMC LLE: 4/5 proximal, 4+/5 distally   Skin: Skin is warm and dry. No rash noted. No erythema.  Psychiatric: She has a normal mood and affect. Her behavior is normal. Thought content normal.    No results found for this or any previous visit (from the past 48 hour(s)). No results found.     Medical Problem List and Plan: 1.  Hemiparesis, post stroke gait disorder secondary to propagation of right MCA infarct. 2.  DVT Prophylaxis/Anticoagulation: Pharmaceutical: Lovenox 3. Pain Management: Tylenol prn 4. Mood: LCSW to follow for evaluation and support.  5. Neuropsych: This patient is capable of making decisions on her own behalf. 6. Skin/Wound Care: routine pressure relief measures.  7. Fluids/Electrolytes/Nutrition: Monitor I/O. Check lytes in am.  8. HTN: On labetalol 300 mg bid and lisinopril at bedtime. Hold Norvasc. Monitor BP tid--avoid hypotension with SBP 130-150 range 9. Dyslipidemia: On Zetia and Pravachol.  10. Hypothyroid: continue supplement 11. E coli UTI: Rocephin  discontinued today --will recheck for efficacy with history of recurrent UTIs. Encourage fluid intake.   12. Dyspepsia: Add Pepcid.  13. Dehydration: Encourage fluid intake to avoid recurrence  and hypotension.  14. ABLA: Continue to monitor Hb   Post Admission Physician Evaluation: 1. Functional deficits secondary  to propagation of right MCA infarct. 2. Patient is admitted to receive collaborative, interdisciplinary care between the physiatrist, rehab nursing staff, and therapy team. 3. Patient's level of medical complexity and substantial therapy needs in context of that medical necessity cannot be provided at a lesser intensity of care such as a SNF. 4. Patient has experienced substantial functional loss from his/her baseline which was documented above under the "Functional History" and "Functional Status" headings.  Judging by the patient's diagnosis, physical exam, and functional history, the patient has potential for functional progress which will result in  measurable gains while on inpatient rehab.  These gains will be of substantial and practical use upon discharge  in facilitating mobility and self-care at the household level. 5. Physiatrist will provide 24 hour management of medical needs as well as oversight of the therapy plan/treatment and provide guidance as appropriate regarding the interaction of the two. 6. 24 hour rehab nursing will assist with safety, disease management, medication administration and patient education and help integrate therapy concepts, techniques,education, etc. 7. PT will assess and treat for/with: Lower extremity strength, range of motion, stamina, balance, functional mobility, safety, adaptive techniques and equipment, coping skills, pain control, stroke education.   Goals are: Mod I. 8. OT will assess and treat for/with: ADL's, functional mobility, safety, upper extremity strength, adaptive techniques and equipment, ego support, and community reintegration.    Goals are: Mod I. Therapy may proceed with showering this patient. 9. SLP will assess and treat for/with: Speech.  Goals are: Mod I/Ind. 10. Case Management and Social Worker will assess and treat for psychological issues and discharge planning. 11. Team conference will be held weekly to assess progress toward goals and to determine barriers to discharge. 12. Patient will receive at least 3 hours of therapy per day at least 5 days per week. 13. ELOS: 8-12 days.       14. Prognosis:  good  Delice Lesch, MD 01/23/2016

## 2016-01-23 NOTE — Clinical Social Work Note (Signed)
CSW received referral for SNF.  CIR has accepted patient for admission today, CSW to sign off please re-consult if social work needs arise.  Jones Broom. [place], MSW, [place]

## 2016-01-23 NOTE — Consult Note (Signed)
Physical Medicine and Rehabilitation Consult Reason for Consult: Propagation of right MCA territory acute  infarcts Referring Physician: [facility]  HPI: [name] is a 82 y.o. right handed female with history of hypertension, hyperlipidemia, recurrent UTIs. Patient with history of CVA with resultant left-sided weakness and slurred speech one week ago treated at [facility] maintained on Plavix. She had stopped taking her Plavix for reasons that were unclear. She lives with her son was independent prior to admission. Son recently had back surgery and can provide limited physical assistance. Presented 01/19/2016 presenting with increasing left hand weakness and a fall in the bathroom. MRI showed mild global propagation of right MCA territory acute small infarcts. MRA of the head positive for posterior right MCA M2 branch occlusion. Neurology consulted presently on aspirin and Plavix for CVA prophylaxis. Subcutaneous heparin for DVT prophylaxis. Urine culture greater than 100,000 Escherichia coli maintained on Rocephin. Tolerating a regular diet. Physical and occupational therapy evaluations completed with recommendations of physical medicine rehabilitation consult.   Review of Systems  Constitutional: Negative for fever and chills.  HENT: Negative for hearing loss.   Eyes: Negative for blurred vision and double vision.  Respiratory: Negative for cough and shortness of breath.   Cardiovascular: Positive for leg swelling. Negative for chest pain and palpitations.  Gastrointestinal: Positive for constipation. Negative for nausea and vomiting.  Genitourinary: Positive for urgency. Negative for dysuria and hematuria.  Musculoskeletal: Positive for myalgias and joint pain.  Neurological: Positive for speech change and weakness. Negative for seizures and headaches.  All other systems reviewed and are negative.  Past Medical History  Diagnosis Date  . Hypertension   . High  cholesterol   . Stroke [facility]) 1990's    "mild", denies residual on 01/28/2014  . Pneumonia     "maybe in my teens"  . Hypothyroidism   . Anemia   . History of blood transfusion     "S/P knee OR"  . Arthritis     "was in my knees"  . Recurrent UTI (urinary tract infection)    Past Surgical History  Procedure Laterality Date  . Muscle biopsy Left 1990's    "knot biopsy from carrying mail bag for years"  . Bladder suspension  2012  . Splenic artery embolization  01/28/2014  . Joint replacement    . Total knee arthroplasty Bilateral 2012  . Vaginal hysterectomy    . Revison of arteriovenous fistula Right 01/31/2014    Procedure: REPAIR PSEUDOANEURYSM;  Surgeon: Serafina Mitchell, MD;  Location: [facility] OR;  Service: Vascular;  Laterality: Right;  Repair of Femoral Pseudoaneurysm.  . Femur im nail Right 03/23/2014    Procedure: INTRAMEDULLARY (IM) NAIL FEMORAL;  Surgeon: Mauri Pole, MD;  Location: [facility] ORS;  Service: Orthopedics;  Laterality: Right;  . Visceral angiogram N/A 01/28/2014    Procedure: VISCERAL ANGIOGRAM;  Surgeon: Conrad Somerset, MD;  Location: [facility] CATH LAB;  Service: Cardiovascular;  Laterality: N/A;  . Embolization N/A 01/28/2014    Procedure: EMBOLIZATION;  Surgeon: Conrad Winfield, MD;  Location: [facility] CATH LAB;  Service: Cardiovascular;  Laterality: N/A;  Splenic Artery   Family History  Problem Relation Age of Onset  . Hypertension Son    Social History:  reports that she has never smoked. She has never used smokeless tobacco. She reports that she does not drink alcohol or use illicit drugs. Allergies:  Allergies  Allergen Reactions  . Aspirin Other (See Comments)    Causes blood  in urine   Medications Prior to Admission  Medication Sig Dispense Refill  . amLODipine (NORVASC) 5 MG tablet Take 5 mg by mouth at bedtime.    . Calcium Carbonate-Vitamin D (CALCIUM 500 + D PO) Take 500 mg by mouth 2 (two) times daily.    . cholecalciferol (VITAMIN D) 1000 UNITS tablet Take 2,000  Units by mouth daily.    . ciprofloxacin (CIPRO) 250 MG tablet Take 250 mg by mouth 2 (two) times daily.    . Cranberry-Vitamin C-Vitamin E (CRANBERRY PLUS VITAMIN C) 140-100-3 MG-MG-UNIT CAPS Take 2,000 Units by mouth every morning.     . ezetimibe (ZETIA) 10 MG tablet Take 10 mg by mouth at bedtime.     . folic acid (FOLVITE) A999333 MCG tablet Take 400 mcg by mouth daily.    Marland Kitchen labetalol (NORMODYNE) 200 MG tablet Take 300 mg by mouth 2 (two) times daily. Take 1 1/2 tablets twice daily    . lactose free nutrition (BOOST) LIQD Take 237 mLs by mouth 3 (three) times daily as needed (meal replacement).    Marland Kitchen levothyroxine (SYNTHROID, LEVOTHROID) 50 MCG tablet Take 50 mcg by mouth daily before breakfast.    . lisinopril (PRINIVIL,ZESTRIL) 40 MG tablet Take 40 mg by mouth at bedtime.    . Omega-3 Fatty Acids (FISH OIL) 1000 MG CAPS Take 1,000 mg by mouth 2 (two) times daily.    . polyethylene glycol (MIRALAX / GLYCOLAX) packet Take 17 g by mouth daily as needed for mild constipation. 14 each 0  . trimethoprim (TRIMPEX) 100 MG tablet Take 100 mg by mouth daily.       Home: Home Living Family/patient expects to be discharged to:: Inpatient rehab Living Arrangements: Children Available Help at Discharge: Family Type of Home: House Home Access: [place]: One level Bathroom Shower/Tub: Gaffer, Research officer, trade union Toilet: Handicapped height [place]: Environmental consultant - 2 wheels, [place] - single point, Wheelchair - manual, Grab bars - toilet, Grab bars - tub/shower, Shower seat - built in Additional Comments: encouraged pt to be using Lt UE functionally.  Functional History: Prior Function Level of Independence: Independent Functional Status:  Mobility: Bed Mobility Overal bed mobility: Modified Independent Bed Mobility: Supine to Sit Supine to sit: Supervision, HOB elevated General bed mobility comments: use of rail; assist for safety Transfers Overall transfer level:  Needs assistance Equipment used: Rolling walker (2 wheeled) Transfers: Sit to/from Stand Sit to Stand: Supervision General transfer comment: RW in front of pt Ambulation/Gait Ambulation/Gait assistance: Min assist Ambulation Distance (Feet): 200 Feet Assistive device: Rolling walker (2 wheeled) Gait Pattern/deviations: Step-through pattern, Step-to pattern, Shuffle, Decreased step length - left, Decreased dorsiflexion - left General Gait Details: difficutly managing walker due to L grip weakness, noting imbalance with L LE weakness increased with fatigue and dragging L foot on floor    ADL: ADL Overall ADL's : Needs assistance/impaired Grooming: Sitting, Supervision/safety, Applying deodorant, Brushing hair, Set up Lower Body Dressing: Minimal assistance, Sit to/from stand Toilet Transfer: RW, Min guard, Ambulation (sit to stand from bed) Functional mobility during ADLs: Min guard, Rolling walker (ambulated with and without RW) General ADL Comments: Encouraged pt to be using Lt hand.   Cognition: Cognition Overall Cognitive Status: Within Functional Limits for tasks assessed Orientation Level: Oriented X4 Cognition Arousal/Alertness: Awake/alert (became awake in session) Behavior During Therapy: WFL for tasks assessed/performed Overall Cognitive Status: Within Functional Limits for tasks assessed  Blood pressure 136/79, pulse 65, temperature 98.2 F (36.8 C), temperature  source Oral, resp. rate 18, SpO2 96 %. Physical Exam  Vitals reviewed. Constitutional: She is oriented to person, place, and time. She appears well-developed and well-nourished.  HENT:  Head: Normocephalic and atraumatic.  Eyes: Conjunctivae and EOM are normal.  Neck: Normal range of motion. Neck supple. No thyromegaly present.  Cardiovascular: Normal rate and regular rhythm.   Respiratory: Effort normal and breath sounds normal. No respiratory distress.  GI: Soft. Bowel sounds are normal. She exhibits no  distension.  Musculoskeletal: She exhibits no edema or tenderness.  Neurological: She is alert and oriented to person, place, and time.  Makes good eye contact with examiner.  Follows full commands.  Fair awareness of deficits. Sensation intact to light touch DTRs symmetric Motor: Left facial weakness RUE/RLE: 5/5 proximal to distal LUE: 4+/5 proximal to distal, decreased FMC LLE: 4/5 proximal, 4+/5 distally  Skin: Skin is warm and dry.  Psychiatric: She has a normal mood and affect. Her behavior is normal.   No results found for this or any previous visit (from the past 24 hour(s)). No results found.  Assessment/Plan: Diagnosis: Propagation of right MCA territory acute  Infarcts Labs and images independently reviewed.  Records reviewed and summated above. Stroke: Continue secondary stroke prophylaxis and Risk Factor Modification listed below:   Antiplatelet therapy:   Blood Pressure Management:  Continue current medication with prn's with permisive HTN per primary team Statin Agent:   Left sided hemiparesis:  Motor recovery: Fluoxetine  1. Does the need for close, 24 hr/day medical supervision in concert with the patient's rehab needs make it unreasonable for this patient to be served in a less intensive setting? Yes 2. Co-Morbidities requiring supervision/potential complications: hyperlipidemia (cont meds), recurrent UTIs (cont abx), hypothyroidism (cont meds and monitor with energy and mood to ensure energy for therapies), HTN (monitor and provide prns in accordance with increased physical exertion and pain), ABLA (transfuse if necessary to ensure appropriate perfusion for increased activity tolerance) 3. Due to safety, disease management, medication administration and patient education, does the patient require 24 hr/day rehab nursing? Yes 4. Does the patient require coordinated care of a physician, rehab nurse, PT (1-2 hrs/day, 5 days/week), OT (1-2 hrs/day, 5 days/week) and SLP  (1-2 hrs/day, 5 days/week) to address physical and functional deficits in the context of the above medical diagnosis(es)? Yes Addressing deficits in the following areas: balance, endurance, locomotion, strength, transferring, dressing, toileting, speech and psychosocial support 5. Can the patient actively participate in an intensive therapy program of at least 3 hrs of therapy per day at least 5 days per week? Yes 6. The potential for patient to make measurable gains while on inpatient rehab is good 7. Anticipated functional outcomes upon discharge from inpatient rehab are modified independent  with PT, modified independent with OT, independent and modified independent with SLP. 8. Estimated rehab length of stay to reach the above functional goals is: 8-12 days. 9. Does the patient have adequate social supports and living environment to accommodate these discharge functional goals? Yes 10. Anticipated D/C setting: Home 11. Anticipated post D/C treatments: HH therapy and Home excercise program 12. Overall Rehab/Functional Prognosis: good  RECOMMENDATIONS: This patient's condition is appropriate for continued rehabilitative care in the following setting: CIR Patient has agreed to participate in recommended program. Yes Note that insurance prior authorization may be required for reimbursement for recommended care.  Comment: Rehab Admissions Coordinator to follow up.  Delice Lesch, MD 01/23/2016

## 2016-01-23 NOTE — Discharge Instructions (Signed)
Follow with Primary MD SCHULTZ,DOUGLAS E, MD in 7 days   Get CBC, CMP, 2 view Chest X ray checked  by Primary MD next visit.    Activity: As tolerated with Full fall precautions use walker/cane & assistance as needed   Disposition CIR/SNF   Diet:   Heart Healthy  with feeding assistance and aspiration precautions.  For Heart failure patients - Check your Weight same time everyday, if you gain over 2 pounds, or you develop in leg swelling, experience more shortness of breath or chest pain, call your Primary MD immediately. Follow Cardiac Low Salt Diet and 1.5 lit/day fluid restriction.   On your next visit with your primary care physician please Get Medicines reviewed and adjusted.   Please request your Prim.MD to go over all Hospital Tests and Procedure/Radiological results at the follow up, please get all Hospital records sent to your Prim MD by signing hospital release before you go home.   If you experience worsening of your admission symptoms, develop shortness of breath, life threatening emergency, suicidal or homicidal thoughts you must seek medical attention immediately by calling 911 or calling your MD immediately  if symptoms less severe.  You Must read complete instructions/literature along with all the possible adverse reactions/side effects for all the Medicines you take and that have been prescribed to you. Take any new Medicines after you have completely understood and accpet all the possible adverse reactions/side effects.   Do not drive, operating heavy machinery, perform activities at heights, swimming or participation in water activities or provide baby sitting services if your were admitted for syncope or siezures until you have seen by Primary MD or a Neurologist and advised to do so again.  Do not drive when taking Pain medications.    Do not take more than prescribed Pain, Sleep and Anxiety Medications  Special Instructions: If you have smoked or chewed Tobacco   in the last 2 yrs please stop smoking, stop any regular Alcohol  and or any Recreational drug use.  Wear Seat belts while driving.   Please note  You were cared for by a hospitalist during your hospital stay. If you have any questions about your discharge medications or the care you received while you were in the hospital after you are discharged, you can call the unit and asked to speak with the hospitalist on call if the hospitalist that took care of you is not available. Once you are discharged, your primary care physician will handle any further medical issues. Please note that NO REFILLS for any discharge medications will be authorized once you are discharged, as it is imperative that you return to your primary care physician (or establish a relationship with a primary care physician if you do not have one) for your aftercare needs so that they can reassess your need for medications and monitor your lab values.

## 2016-01-23 NOTE — H&P (Signed)
Physical Medicine and Rehabilitation Admission H&P    Chief Complaint  Patient presents with  . Worsening of left sided weakness and left facial weakness.     HPI: [name] is a 82 y.o. RH-female with history of HTN, OA, recurrent UTIs,  CVA with left sided weakness with numbness and slurred speech at [facility] a week PTA with persistent left sided symptoms. She sustained a fall and struck her head on the toilet and was admitted for work up.   She had been off plavix for a week for unclear reasons--question discontinued at discharged v/s stopped taking it.   MRI brain showed mild local propagation of RIGHT MCA territory acute small infarcts and positive for moderate to severe stenosis  bilateral PCA P2 segments and L-VA distal segment and posterior R-MCA M2 occlusion 32mm beyond its orign. Carotid dopplers without significant stenosis. 2 D echo with EF 60-65% with mild concentric LVH.   Dr. Erlinda Hong recommended ASA/plavix for 3 months followed by plavix. She is to follow up with [facility] heart care for 30 day event monitor to rule out A fib due to  M2 occlusion and avoid hypotension with SBP 130-150 range to allow for perfusion.    She was found to have E coli UTI and placed on rocephin for treatment. Patient with resultant mild to moderate dysarthria, left sided weakness with decreased BOS as well as difficulty with ADL tasks. CIR recommended by rehab team for follow up therapy.     Review of Systems  HENT: Negative for hearing loss.   Eyes: Negative for blurred vision and double vision.  Respiratory: Negative for cough, shortness of breath and wheezing.   Cardiovascular: Negative for chest pain and palpitations.  Gastrointestinal: Negative for heartburn, nausea and abdominal pain.  Genitourinary: Positive for frequency (urinates every 2 hours--sees Dr. Gerlene Burdock in [place]).  Musculoskeletal: Negative for myalgias and neck pain.  Skin: Negative for rash.  Neurological: Positive for speech change,  focal weakness and weakness. Negative for dizziness, tingling and headaches.  Psychiatric/Behavioral: Negative for memory loss. The patient is nervous/anxious.   All other systems reviewed and are negative.     Past Medical History  Diagnosis Date  . Hypertension   . High cholesterol   . Stroke [facility]) 1990's    "mild", denies residual on 01/28/2014  . Pneumonia     "maybe in my teens"  . Hypothyroidism   . Anemia   . History of blood transfusion     "S/P knee OR"  . Arthritis     "was in my knees"  . Recurrent UTI (urinary tract infection)     Past Surgical History  Procedure Laterality Date  . Muscle biopsy Left 1990's    "knot biopsy from carrying mail bag for years"  . Bladder suspension  2012  . Splenic artery embolization  01/28/2014  . Joint replacement    . Total knee arthroplasty Bilateral 2012  . Vaginal hysterectomy    . Revison of arteriovenous fistula Right 01/31/2014    Procedure: REPAIR PSEUDOANEURYSM;  Surgeon: Serafina Mitchell, MD;  Location: [facility] OR;  Service: Vascular;  Laterality: Right;  Repair of Femoral Pseudoaneurysm.  . Femur im nail Right 03/23/2014    Procedure: INTRAMEDULLARY (IM) NAIL FEMORAL;  Surgeon: Mauri Pole, MD;  Location: [facility] ORS;  Service: Orthopedics;  Laterality: Right;  . Visceral angiogram N/A 01/28/2014    Procedure: VISCERAL ANGIOGRAM;  Surgeon: Conrad [place], MD;  Location: [facility] CATH LAB;  Service:  Cardiovascular;  Laterality: N/A;  . Embolization N/A 01/28/2014    Procedure: EMBOLIZATION;  Surgeon: Conrad [place], MD;  Location: [facility] CATH LAB;  Service: Cardiovascular;  Laterality: N/A;  Splenic Artery    Family History  Problem Relation Age of Onset  . Hypertension Son     Social History:  Widowed. Son live with her and recently had surgery. She reports that she has never smoked. She has never used smokeless tobacco. She reports that she does not drink alcohol or use illicit drugs.    Allergies  Allergen Reactions  . Aspirin Other  (See Comments)    Causes blood in urine    Medications Prior to Admission  Medication Sig Dispense Refill  . amLODipine (NORVASC) 5 MG tablet Take 5 mg by mouth at bedtime.    . Calcium Carbonate-Vitamin D (CALCIUM 500 + D PO) Take 500 mg by mouth 2 (two) times daily.    . cholecalciferol (VITAMIN D) 1000 UNITS tablet Take 2,000 Units by mouth daily.    . ciprofloxacin (CIPRO) 250 MG tablet Take 250 mg by mouth 2 (two) times daily.    . Cranberry-Vitamin C-Vitamin E (CRANBERRY PLUS VITAMIN C) 140-100-3 MG-MG-UNIT CAPS Take 2,000 Units by mouth every morning.     . ezetimibe (ZETIA) 10 MG tablet Take 10 mg by mouth at bedtime.     . folic acid (FOLVITE) A999333 MCG tablet Take 400 mcg by mouth daily.    Marland Kitchen labetalol (NORMODYNE) 200 MG tablet Take 300 mg by mouth 2 (two) times daily. Take 1 1/2 tablets twice daily    . lactose free nutrition (BOOST) LIQD Take 237 mLs by mouth 3 (three) times daily as needed (meal replacement).    Marland Kitchen levothyroxine (SYNTHROID, LEVOTHROID) 50 MCG tablet Take 50 mcg by mouth daily before breakfast.    . lisinopril (PRINIVIL,ZESTRIL) 40 MG tablet Take 40 mg by mouth at bedtime.    . Omega-3 Fatty Acids (FISH OIL) 1000 MG CAPS Take 1,000 mg by mouth 2 (two) times daily.    . polyethylene glycol (MIRALAX / GLYCOLAX) packet Take 17 g by mouth daily as needed for mild constipation. 14 each 0  . trimethoprim (TRIMPEX) 100 MG tablet Take 100 mg by mouth daily.       Home: Home Living Family/patient expects to be discharged to:: Inpatient rehab Living Arrangements: Children Available Help at Discharge: Family Type of Home: House Home Access: [place]: One level Bathroom Shower/Tub: Gaffer, Research officer, trade union Toilet: Handicapped height [place]: Environmental consultant - 2 wheels, [place] - single point, Wheelchair - manual, Grab bars - toilet, Grab bars - tub/shower, Shower seat - built in Additional Comments: encouraged pt to be using Lt UE  functionally.   Functional History: Prior Function Level of Independence: Independent  Functional Status:  Mobility: Bed Mobility Overal bed mobility: Modified Independent Bed Mobility: Sit to Supine Supine to sit: Supervision, HOB elevated Sit to supine: Supervision General bed mobility comments: cues for positioning Transfers Overall transfer level: Needs assistance Equipment used: None Transfers: Sit to/from Stand Sit to Stand: Min guard General transfer comment: for balance Ambulation/Gait Ambulation/Gait assistance: Min assist Ambulation Distance (Feet): 150 Feet Assistive device: None Gait Pattern/deviations: Step-through pattern, Decreased stride length, Decreased stance time - left, Decreased dorsiflexion - left, Decreased step length - right, Narrow base of support General Gait Details: managed without walker for entire session, but given hand hold assist after balance activities in hallway due to fatigue; cues for L heel  strike and rollover to toes;     ADL: ADL Overall ADL's : Needs assistance/impaired Grooming: Sitting, Supervision/safety, Applying deodorant, Brushing hair, Set up Lower Body Dressing: Minimal assistance, Sit to/from stand Toilet Transfer: RW, Min guard, Ambulation (sit to stand from bed) Functional mobility during ADLs: Min guard, Rolling walker (ambulated with and without RW) General ADL Comments: Encouraged pt to be using Lt hand.   Cognition: Cognition Overall Cognitive Status: Within Functional Limits for tasks assessed Orientation Level: Oriented X4 Cognition Arousal/Alertness: Awake/alert Behavior During Therapy: WFL for tasks assessed/performed Overall Cognitive Status: Within Functional Limits for tasks assessed   Blood pressure 129/67, pulse 62, temperature 98.7 F (37.1 C), temperature source Oral, resp. rate 20, SpO2 96 %. Physical Exam  Nursing note and vitals reviewed. Constitutional: She is oriented to person, place, and  time. She appears well-developed and well-nourished.  HENT:  Head: Normocephalic and atraumatic.  Mouth/Throat: Oropharynx is clear and moist.  Eyes: Conjunctivae are normal. Pupils are equal, round, and reactive to light.  Neck: Normal range of motion. Neck supple.  Cardiovascular: Normal rate and regular rhythm.   Murmur heard. Respiratory: Effort normal and breath sounds normal. No respiratory distress. She has no wheezes.  GI: Soft. Bowel sounds are normal. She exhibits no distension. There is no tenderness.  Musculoskeletal: She exhibits no edema or tenderness.  Neurological: She is alert and oriented to person, place, and time.  Left facial weakness with mild to moderate dysarthria.  Mild left inattention?  Able to follow basic one and two step commands without difficulty.  DTRs symmetric Sensation intact to light touch RUE/RLE: 5/5 proximal to distal LUE: 4+/5 proximal to distal, decreased FMC LLE: 4/5 proximal, 4+/5 distally   Skin: Skin is warm and dry. No rash noted. No erythema.  Psychiatric: She has a normal mood and affect. Her behavior is normal. Thought content normal.    No results found for this or any previous visit (from the past 48 hour(s)). No results found.     Medical Problem List and Plan: 1.  Hemiparesis, post stroke gait disorder secondary to propagation of right MCA infarct. 2.  DVT Prophylaxis/Anticoagulation: Pharmaceutical: Lovenox 3. Pain Management: Tylenol prn 4. Mood: LCSW to follow for evaluation and support.  5. Neuropsych: This patient is capable of making decisions on her own behalf. 6. Skin/Wound Care: routine pressure relief measures.  7. Fluids/Electrolytes/Nutrition: Monitor I/O. Check lytes in am.  8. HTN: On labetalol 300 mg bid and lisinopril at bedtime. Hold Norvasc. Monitor BP tid--avoid hypotension with SBP 130-150 range 9. Dyslipidemia: On Zetia and Pravachol.  10. Hypothyroid: continue supplement 11. E coli UTI: Rocephin  discontinued today --will recheck for efficacy with history of recurrent UTIs. Encourage fluid intake.   12. Dyspepsia: Add Pepcid.  13. Dehydration: Encourage fluid intake to avoid recurrence  and hypotension.  14. ABLA: Continue to monitor Hb   Post Admission Physician Evaluation: 1. Functional deficits secondary  to propagation of right MCA infarct. 2. Patient is admitted to receive collaborative, interdisciplinary care between the physiatrist, rehab nursing staff, and therapy team. 3. Patient's level of medical complexity and substantial therapy needs in context of that medical necessity cannot be provided at a lesser intensity of care such as a SNF. 4. Patient has experienced substantial functional loss from his/her baseline which was documented above under the "Functional History" and "Functional Status" headings.  Judging by the patient's diagnosis, physical exam, and functional history, the patient has potential for functional progress which will result in  measurable gains while on inpatient rehab.  These gains will be of substantial and practical use upon discharge  in facilitating mobility and self-care at the household level. 5. Physiatrist will provide 24 hour management of medical needs as well as oversight of the therapy plan/treatment and provide guidance as appropriate regarding the interaction of the two. 6. 24 hour rehab nursing will assist with safety, disease management, medication administration and patient education and help integrate therapy concepts, techniques,education, etc. 7. PT will assess and treat for/with: Lower extremity strength, range of motion, stamina, balance, functional mobility, safety, adaptive techniques and equipment, coping skills, pain control, stroke education.   Goals are: Mod I. 8. OT will assess and treat for/with: ADL's, functional mobility, safety, upper extremity strength, adaptive techniques and equipment, ego support, and community reintegration.    Goals are: Mod I. Therapy may proceed with showering this patient. 9. SLP will assess and treat for/with: Speech.  Goals are: Mod I/Ind. 10. Case Management and Social Worker will assess and treat for psychological issues and discharge planning. 11. Team conference will be held weekly to assess progress toward goals and to determine barriers to discharge. 12. Patient will receive at least 3 hours of therapy per day at least 5 days per week. 13. ELOS: 8-12 days.       14. Prognosis:  good  Delice Lesch, MD 01/23/2016

## 2016-01-23 NOTE — Interval H&P Note (Signed)
[name] was admitted today to Inpatient Rehabilitation with the diagnosis of propagation of right MCA infarct.  The patient's history has been reviewed, patient examined, and there is no change in status.  Patient continues to be appropriate for intensive inpatient rehabilitation.  I have reviewed the patient's chart and labs.  Questions were answered to the patient's satisfaction. The PAPE has been reviewed and assessment remains appropriate.  Ankit Lorie Phenix 01/23/2016, 10:13 PM

## 2016-01-23 NOTE — Progress Notes (Signed)
Speech Language Pathology Treatment: Cognitive-Linquistic  Patient Details Name: TAMAR DANTUONO MRN: [id number] DOB: 01-16-1934 Today's Date: 01/23/2016 Time: [id number] SLP Time Calculation (min) (ACUTE ONLY): 26 min  Assessment / Plan / Recommendation Clinical Impression  Pt cooperative during session with compensatory strategies utilized for slow rate, over-articulation, pausing between words and increased intensity for adequate breath support within conversational tasks utilizing read aloud tasks, complex sentences with minimal verbal cues needed from SLP; OME for increased strength for left re: facial/lingual strength given and utilized during session with visual cues from mirror needed; pt's overall intelligibility ranged from 50-60% overall during session; continue current POC for increased intelligibility overall; pt stated she sometimes has difficulty with "drooling" during meals; discussed swallowing strategies for utilizing stronger (right) side and eating foods that are softer and require less mastication until left weakness improves.   HPI HPI: Ms. KANESHIA FORNSHELL is a 82 y.o. female with history of hypertension, hypercholesterolemia, hypothyroidism, and recurrent urinary tract infections and stroke one week presenting with persistent left hand and facial weakness.      SLP Plan  Continue with current plan of care     Recommendations                Follow up Recommendations: Inpatient Rehab Plan: Continue with current plan of care                    Randal Goens,PAT, M.S., CCC-SLP 01/23/2016, 11:49 AM

## 2016-01-23 NOTE — Progress Notes (Signed)
Physical Therapy Treatment Patient Details Name: [name] MRN: [id number] DOB: [birth year] Today's Date: 01/23/2016    History of Present Illness  82 y.o. female history of hypertension, anemia, arthritis, hypercholesterolemia, hypothyroidism, and recurrent urinary tract infections and stroke recently who presented with persistent weakness of her left hand. She came to the emergency room after a fall in her bathroom.  MRI of the brain showed mild local propagation of RIGHT MCA territory acute small infarcts.    PT Comments    Patient progressing with mobility this session and balance.  She is determined to improve and works hard each session.  Reports happy to have books to read from SLP this weekend to work on dysarthria.  Feel she is great candidate for CIR level rehab. Will continue skilled PT in the acute setting.  Needs formal balance assessment next session to quantify fall risk.   Follow Up Recommendations  CIR     Equipment Recommendations  None recommended by PT    Recommendations for Other Services       Precautions / Restrictions Precautions Precautions: Fall    Mobility  Bed Mobility   Bed Mobility: Sit to Supine       Sit to supine: Supervision   General bed mobility comments: cues for positioning  Transfers Overall transfer level: Needs assistance Equipment used: None Transfers: Sit to/from Stand Sit to Stand: Min guard         General transfer comment: for balance  Ambulation/Gait Ambulation/Gait assistance: Min assist Ambulation Distance (Feet): 150 Feet Assistive device: None Gait Pattern/deviations: Step-through pattern;Decreased stride length;Decreased stance time - left;Decreased dorsiflexion - left;Decreased step length - right;Narrow base of support     General Gait Details: managed without walker for entire session, but given hand hold assist after balance activities in hallway due to fatigue; cues for L heel strike and rollover  to toes;    Stairs            Wheelchair Mobility    Modified Rankin (Stroke Patients Only) Modified Rankin (Stroke Patients Only) Pre-Morbid Rankin Score: No significant disability Modified Rankin: Moderately severe disability     Balance Overall balance assessment: Needs assistance         Standing balance support: No upper extremity supported Standing balance-Leahy Scale: Fair Standing balance comment: standing without UE support close S               High Level Balance Comments: in hallway at wall rail side stepping no UE support (min A), crossing over with UE support, forward march one UE support, forward tandem bilat UE support    Cognition Arousal/Alertness: Awake/alert Behavior During Therapy: WFL for tasks assessed/performed Overall Cognitive Status: Within Functional Limits for tasks assessed                      Exercises      General Comments        Pertinent Vitals/Pain Pain Assessment: No/denies pain    Home Living                      Prior Function            PT Goals (current goals can now be found in the care plan section) Progress towards PT goals: Progressing toward goals    Frequency  Min 4X/week    PT Plan Current plan remains appropriate    Co-evaluation  End of Session Equipment Utilized During Treatment: Gait belt Activity Tolerance: Patient tolerated treatment well Patient left: with bed alarm set;with call bell/phone within reach;in bed     Time: 7253-6644 PT Time Calculation (min) (ACUTE ONLY): 19 min  Charges:  $Gait Training: 8-22 mins                    G Codes:      [name] Jan 27, 2016, 10:23 AM  [name], PT [phone] Jan 27, 2016

## 2016-01-23 NOTE — Interval H&P Note (Deleted)
[name] was admitted today to Inpatient Rehabilitation with the diagnosis of right MCA infarct.  The patient's history has been reviewed, patient examined, and there is no change in status.  Patient continues to be appropriate for intensive inpatient rehabilitation.  I have reviewed the patient's chart and labs.  Questions were answered to the patient's satisfaction. The PAPE has been reviewed and assessment remains appropriate.  Harrison Paulson Lorie Phenix 01/23/2016, 10:12 PM

## 2016-01-23 NOTE — Care Management Note (Signed)
Case Management Note  Patient Details  Name: [name] MRN: [id number] Date of Birth: October 28, 1933  Subjective/Objective:                    Action/Plan: Pt discharging to CIR today. No further needs per CM.   Expected Discharge Date:                  Expected Discharge Plan:  [place]  In-House Referral:     Discharge planning Services  CM Consult  Post Acute Care Choice:    Choice offered to:     DME Arranged:    DME Agency:     HH Arranged:    [place] Agency:     Status of Service:  Completed, signed off  Medicare Important Message Given:    Date Medicare IM Given:    Medicare IM give by:    Date Additional Medicare IM Given:    Additional Medicare Important Message give by:     If discussed at [place] of Stay Meetings, dates discussed:    Additional Comments:  Pollie Friar, RN 01/23/2016, 1:40 PM

## 2016-01-23 NOTE — Discharge Summary (Signed)
[name], is a 82 y.o. female  DOB March 18, 1934  MRN [id number].  Admission date:  01/19/2016  Admitting Physician  Eduard Clos, MD  Discharge Date:  01/23/2016   Primary MD  Paulina Fusi, MD  Recommendations for primary care physician for things to follow:  Monitor secondary risk factors for CVA   Admission Diagnosis  Ischemic stroke [facility]) [I63.9]   Discharge Diagnosis  Ischemic stroke (HCC) [I63.9]     Active Problems:   HTN (hypertension)   Hypothyroidism   Ischemic stroke (HCC)   Recurrent UTI   HLD (hyperlipidemia)   Intracranial vascular stenosis      Past Medical History  Diagnosis Date  . Hypertension   . High cholesterol   . Stroke [facility]) 1990's    "mild", denies residual on 01/28/2014  . Pneumonia     "maybe in my teens"  . Hypothyroidism   . Anemia   . History of blood transfusion     "S/P knee OR"  . Arthritis     "was in my knees"  . Recurrent UTI (urinary tract infection)     Past Surgical History  Procedure Laterality Date  . Muscle biopsy Left 1990's    "knot biopsy from carrying mail bag for years"  . Bladder suspension  2012  . Splenic artery embolization  01/28/2014  . Joint replacement    . Total knee arthroplasty Bilateral 2012  . Vaginal hysterectomy    . Revison of arteriovenous fistula Right 01/31/2014    Procedure: REPAIR PSEUDOANEURYSM;  Surgeon: Nada Libman, MD;  Location: [facility] OR;  Service: Vascular;  Laterality: Right;  Repair of Femoral Pseudoaneurysm.  . Femur im nail Right 03/23/2014    Procedure: INTRAMEDULLARY (IM) NAIL FEMORAL;  Surgeon: Shelda Pal, MD;  Location: [facility] ORS;  Service: Orthopedics;  Laterality: Right;  . Visceral angiogram N/A 01/28/2014    Procedure: VISCERAL ANGIOGRAM;  Surgeon: Fransisco Hertz, MD;  Location: [facility] CATH LAB;   Service: Cardiovascular;  Laterality: N/A;  . Embolization N/A 01/28/2014    Procedure: EMBOLIZATION;  Surgeon: Fransisco Hertz, MD;  Location: [facility] CATH LAB;  Service: Cardiovascular;  Laterality: N/A;  Splenic Artery       HPI  from the history and physical done on the day of admission:    Patient is a 82 y.o. female with past medical history of hypertension, dyslipidemia, hypothyroidism who was just discharged from [facility] on 5/5 after workup for CVA, brought in for worsening left arm weakness and slurred speech. Further evaluation revealed extension of recent infarct and a UTI.     Hospital Course:      Acute right MCA territory infarcts with extension: Left upper extremity strength has improved, approximately 4/5 this a.m. Recent 2-D echo and carotid Doppler done at [facility]- without any significant abnormalities. MRA shows significant intracranial atherosclerotic disease, recommendations from radiology are to continue dual antiplatelets with aspirin and Plavix for 3 months, and then Plavix alone. Neurology recommends a 30 day event monitor, have  spoken with cardiology to see if this can be arranged. A1c 5.3, LDL 95. Statin added to Zetia. Seen by physical therapy,  Will need CIR/SNF.   UTI: Continue IV Rocephin stopped 5-15.  HTN (hypertension): Allow permissive hypertension-would avoid aggressive control of blood pressure in the setting of extension of CVA. Blood pressure seems to be reasonably well-controlled-would continue with lisinopril and labetalol- will hold Norvasc on DC. Avoid drop in BP.  Dyslipidemia: LDL 95- continue with pravastatin and zetia  Hypothyroidism: Continue levothyroxine    Follow UP  Follow-up Information    Follow up with [facility] On 01/26/2016.   Specialty:  Cardiology   Why:  11:30AM. Obtain outpatient heart monitor.    Contact information:   [address] [place]  [address] [phone]      Follow up with Lance Muss, MD On 03/08/2016.   Specialties:  Cardiology, Radiology, Interventional Cardiology   Why:  11:00AM. Cardiology visit after event monitor   Contact information:   [address] [place] [address] [phone]       Follow up with Nilda Riggs, NP. Schedule an appointment as soon as possible for a visit in 2 months.   Specialty:  Family Medicine   Why:  stroke clinic   Contact information:   [address] [place] [address] [phone]       Follow up with [facility], MD. Schedule an appointment as soon as possible for a visit in 1 week.   Specialty:  Internal Medicine   Contact information:   [address] [place] [address] [phone]        Consults obtained - Neuro  Discharge Condition: Stable  Diet and Activity recommendation: See Discharge Instructions below  Discharge Instructions           Discharge Instructions    Ambulatory referral to Neurology    Complete by:  As directed   Follow up with Darrol Angel, NP, at [facility] in about 2 months. Thanks.     Diet - low sodium heart healthy    Complete by:  As directed      Increase activity slowly    Complete by:  As directed              Discharge Medications       Medication List    STOP taking these medications        amLODipine 5 MG tablet  Commonly known as:  NORVASC     ciprofloxacin 250 MG tablet  Commonly known as:  CIPRO      TAKE these medications        aspirin 325 MG tablet  Take 1 tablet (325 mg total) by mouth daily.     CALCIUM 500 + D PO  Take 500 mg by mouth 2 (two) times daily.     cholecalciferol 1000 units tablet  Commonly known as:  VITAMIN D  Take 2,000 Units by mouth daily.     clopidogrel 75 MG tablet  Commonly known as:  PLAVIX  Take 1 tablet (75 mg total) by mouth daily.     CRANBERRY PLUS VITAMIN C 140-100-3 MG-MG-UNIT Caps   Generic drug:  Cranberry-Vitamin C-Vitamin E  Take 2,000 Units by mouth every morning.     ezetimibe 10 MG tablet  Commonly known as:  ZETIA  Take 10 mg by mouth at bedtime.     Fish Oil  1000 MG Caps  Take 1,000 mg by mouth 2 (two) times daily.     folic acid 400 MCG tablet  Commonly known as:  FOLVITE  Take 400 mcg by mouth daily.     labetalol 200 MG tablet  Commonly known as:  NORMODYNE  Take 300 mg by mouth 2 (two) times daily. Take 1 1/2 tablets twice daily     lactose free nutrition Liqd  Take 237 mLs by mouth 3 (three) times daily as needed (meal replacement).     levothyroxine 50 MCG tablet  Commonly known as:  SYNTHROID, LEVOTHROID  Take 50 mcg by mouth daily before breakfast.     lisinopril 40 MG tablet  Commonly known as:  PRINIVIL,ZESTRIL  Take 40 mg by mouth at bedtime.     polyethylene glycol packet  Commonly known as:  MIRALAX / GLYCOLAX  Take 17 g by mouth daily as needed for mild constipation.     pravastatin 20 MG tablet  Commonly known as:  PRAVACHOL  Take 1 tablet (20 mg total) by mouth daily.     trimethoprim 100 MG tablet  Commonly known as:  TRIMPEX  Take 100 mg by mouth daily.        Major procedures and Radiology Reports - PLEASE review detailed and final reports for all details, in brief -       Dg Chest 2 View  01/19/2016  CLINICAL DATA:  Stroke last night.  Weakness. EXAM: CHEST  2 VIEW COMPARISON:  03/23/2014 FINDINGS: Diffuse interstitial prominence throughout the lungs, unchanged, likely chronic interstitial lung disease. Mild emphysema/COPD. Heart is borderline in size. No confluent opacities or effusions. No real change since prior study. IMPRESSION: Stable chronic interstitial lung disease and COPD. Borderline heart size. Electronically Signed   By: Charlett Nose M.D.   On: 01/19/2016 11:58   Mr Brain Wo Contrast  01/19/2016  CLINICAL DATA:  Larey Seat and hit head on toilet tonight. Gradual onset LEFT hand weakness for 1 week. Assess  for new stroke. Recent diagnosis of stroke. Off aspirin and Plavix. History of hypertension and hypercholesterolemia. EXAM: MRI HEAD WITHOUT CONTRAST TECHNIQUE: Multiplanar, multiecho pulse sequences of the brain and surrounding structures were obtained without intravenous contrast. COMPARISON:  MRI of the head Jan 13, 2016 FINDINGS: Patchy reduced diffusion RIGHT frontoparietal junction, with a few new punctate foci of reduced diffusion in similar distribution. Low ADC values in confluent area of reduced diffusion associated T2 bright FLAIR signal. No susceptibility artifact to suggest hemorrhage. Punctate foci of susceptibility artifact in bilateral thalamus, LEFT cerebellum, LEFT dorsal pons can be seen in the setting of chronic hypertension. No midline shift, mass effect or mass lesions. Ventricles and sulci are normal for patient's age. Patchy to confluent supratentorial and pontine white matter FLAIR T2 hyperintensities. No abnormal extra-axial fluid collections. Major intracranial vascular flow voids present at the skullbase. Attenuated flow voids within RIGHT sylvian fissure. Paranasal sinuses and mastoid air cells are well aerated. Ocular globes and orbital contents are normal. No abnormal sellar expansion. No cerebellar tonsillar ectopia. No suspicious calvarial bone marrow signal. IMPRESSION: Mild local propagation of RIGHT MCA territory acute small infarcts. Attenuated RIGHT middle cerebral artery mid to distal segments would be better characterized on MRA versus CTA HEAD. Involutional changes. Moderate chronic small vessel ischemic disease. Electronically Signed   By: Awilda Metro M.D.   On: 01/19/2016 06:23   Mr Palma Holter  01/20/2016  ADDENDUM REPORT: 01/20/2016 09:03 ADDENDUM: Study discussed by telephone with Neurology Dr.  Marvel Plan, on 01/20/2016 at 0855 hours Electronically Signed   By: Odessa Fleming M.D.   On: 01/20/2016 09:03  01/20/2016  CLINICAL DATA:  82 year old female status post right MCA  infarct with mild extension since 01/13/2016. Initial encounter. EXAM: MRA HEAD WITHOUT CONTRAST TECHNIQUE: Angiographic images of the Circle of Willis were obtained using MRA technique without intravenous contrast. COMPARISON:  Brain MRI 01/19/2016, 01/13/2016, and earlier FINDINGS: No intracranial mass effect or ventriculomegaly. Antegrade flow in the posterior circulation with irregular and stenotic distal left vertebral artery. Severe left V4 stenosis occurs just proximal to the vertebrobasilar junction (series 305, image 10). Negative distal right vertebral artery. Both PICA origins remain patent. Basilar artery is patent with mild irregularity, no significant stenosis. SCA origins are normal. Fetal type bilateral PCA origins. Moderate to severe bilateral P 2 segment PCA stenosis worse on the left (series 306, image 13). Preserved distal PCA flow. Antegrade flow in the distal cervical ICAs, the right is tortuous. Antegrade flow in both ICA siphons. No siphon stenosis. Ophthalmic and posterior communicating artery origins are normal. Patent carotid termini. Normal ACA origins. Moderate to severe left ACA A1 stenosis (series 304, image 10). Diminutive anterior communicating artery. Visualized ACA branches are within normal limits. Left MCA origin, M1 segment, bifurcation, and left MCA branches are within normal limits. Right MCA origin and M1 segment are normal. The right MCA bifurcation is patent, but the posterior sylvian M2 branches occluded about 8 mm beyond its origin. IMPRESSION: 1. Positive for Posterior right MCA M2 branch occlusion 8 mm beyond its origin. 2. No more proximal right anterior circulation lesion. 3. Intracranial atherosclerosis with moderate or severe stenoses: - Left vertebral artery distal V4 segment, - Bilateral PCA P2 segments worse on the left, and - Left ACA A1 segment. Electronically Signed: By: Odessa Fleming M.D. On: 01/20/2016 08:48    Micro Results      Recent Results (from the  past 240 hour(s))  Culture, Urine     Status: Abnormal   Collection Time: 01/19/16  4:15 AM  Result Value Ref Range Status   Specimen Description URINE, CLEAN CATCH  Final   Special Requests NONE  Final   Culture >=100,000 COLONIES/mL ESCHERICHIA COLI (A)  Final   Report Status 01/22/2016 FINAL  Final   Organism ID, Bacteria ESCHERICHIA COLI (A)  Final      Susceptibility   Escherichia coli - MIC*    AMPICILLIN 8 SENSITIVE Sensitive     CEFAZOLIN <=4 SENSITIVE Sensitive     CEFTRIAXONE <=1 SENSITIVE Sensitive     CIPROFLOXACIN >=4 RESISTANT Resistant     GENTAMICIN <=1 SENSITIVE Sensitive     IMIPENEM <=0.25 SENSITIVE Sensitive     NITROFURANTOIN <=16 SENSITIVE Sensitive     TRIMETH/SULFA >=320 RESISTANT Resistant     AMPICILLIN/SULBACTAM 4 SENSITIVE Sensitive     PIP/TAZO <=4 SENSITIVE Sensitive     * >=100,000 COLONIES/mL ESCHERICHIA COLI     Today   Subjective    [name] today has no headache,no chest abdominal pain,no new weakness tingling or numbness, feels much better.   Objective   Blood pressure 136/79, pulse 65, temperature 98.2 F (36.8 C), temperature source Oral, resp. rate 18, SpO2 96 %.   Today's Vitals   01/23/16 0300 01/23/16 0400 01/23/16 0552 01/23/16 0800  BP:   136/79   Pulse:   65   Temp:   98.2 F (36.8 C)   TempSrc:   Oral   Resp:   18  SpO2:   96%   PainSc: Asleep Asleep  0-No pain      Intake/Output Summary (Last 24 hours) at 01/23/16 1049 Last data filed at 01/22/16 1512  Gross per 24 hour  Intake    240 ml  Output      0 ml  Net    240 ml    Exam Awake Alert, Oriented x 3, No new F.N deficits, LUE 4/5,LLE 4/5,L droop, Normal affect [place].AT,PERRAL Supple Neck,No JVD, No cervical lymphadenopathy appriciated.  Symmetrical Chest wall movement, Good air movement bilaterally, CTAB RRR,No Gallops,Rubs or new Murmurs, No Parasternal Heave +ve B.Sounds, Abd Soft, Non tender, No organomegaly appriciated, No rebound -guarding  or rigidity. No Cyanosis, Clubbing or edema, No new Rash or bruise   Data Review   CBC w Diff:  Lab Results  Component Value Date   WBC 6.2 01/20/2016   HGB 11.1* 01/20/2016   HCT 32.5* 01/20/2016   PLT 159 01/20/2016   LYMPHOPCT 27 01/19/2016   MONOPCT 11 01/19/2016   EOSPCT 3 01/19/2016   BASOPCT 1 01/19/2016    CMP:  Lab Results  Component Value Date   NA 138 01/20/2016   K 4.1 01/20/2016   CL 106 01/20/2016   CO2 20* 01/20/2016   BUN 16 01/20/2016   CREATININE 0.73 01/20/2016   PROT 6.5 01/19/2016   ALBUMIN 4.3 01/19/2016   BILITOT 1.3* 01/19/2016   ALKPHOS 76 01/19/2016   AST 41 01/19/2016   ALT 45 01/19/2016  .  Lab Results  Component Value Date   CHOL 163 01/20/2016   HDL 23* 01/20/2016   LDLCALC 95 01/20/2016   TRIG 225* 01/20/2016   CHOLHDL 7.1 01/20/2016    Lab Results  Component Value Date   HGBA1C 5.5 01/20/2016    Total Time in preparing paper work, data evaluation and todays exam - 35 minutes  Leroy Sea M.D on 01/23/2016 at 10:49 AM  [facility]   Office  [phone]

## 2016-01-24 ENCOUNTER — Inpatient Hospital Stay (HOSPITAL_COMMUNITY): Payer: Medicare Other | Admitting: Physical Therapy

## 2016-01-24 ENCOUNTER — Inpatient Hospital Stay (HOSPITAL_COMMUNITY): Payer: Medicare Other | Admitting: Occupational Therapy

## 2016-01-24 ENCOUNTER — Inpatient Hospital Stay (HOSPITAL_COMMUNITY): Payer: Medicare Other | Admitting: Speech Pathology

## 2016-01-24 DIAGNOSIS — G8194 Hemiplegia, unspecified affecting left nondominant side: Secondary | ICD-10-CM

## 2016-01-24 LAB — CBC WITH DIFFERENTIAL/PLATELET
BASOS PCT: 1 %
Basophils Absolute: 0.1 10*3/uL (ref 0.0–0.1)
EOS ABS: 0.2 10*3/uL (ref 0.0–0.7)
Eosinophils Relative: 4 %
HCT: 35.1 % — ABNORMAL LOW (ref 36.0–46.0)
HEMOGLOBIN: 11.9 g/dL — AB (ref 12.0–15.0)
Lymphocytes Relative: 30 %
Lymphs Abs: 1.8 10*3/uL (ref 0.7–4.0)
MCH: 31 pg (ref 26.0–34.0)
MCHC: 33.9 g/dL (ref 30.0–36.0)
MCV: 91.4 fL (ref 78.0–100.0)
Monocytes Absolute: 0.7 10*3/uL (ref 0.1–1.0)
Monocytes Relative: 11 %
NEUTROS PCT: 54 %
Neutro Abs: 3.2 10*3/uL (ref 1.7–7.7)
Platelets: 150 10*3/uL (ref 150–400)
RBC: 3.84 MIL/uL — AB (ref 3.87–5.11)
RDW: 13.2 % (ref 11.5–15.5)
WBC: 5.9 10*3/uL (ref 4.0–10.5)

## 2016-01-24 LAB — COMPREHENSIVE METABOLIC PANEL
ALBUMIN: 3.8 g/dL (ref 3.5–5.0)
ALK PHOS: 63 U/L (ref 38–126)
ALT: 64 U/L — AB (ref 14–54)
AST: 66 U/L — AB (ref 15–41)
Anion gap: 13 (ref 5–15)
BUN: 15 mg/dL (ref 6–20)
CALCIUM: 10.1 mg/dL (ref 8.9–10.3)
CO2: 22 mmol/L (ref 22–32)
CREATININE: 0.86 mg/dL (ref 0.44–1.00)
Chloride: 102 mmol/L (ref 101–111)
GFR calc Af Amer: >60 mL/min (ref >60)
GFR calc non Af Amer: >60 mL/min (ref >60)
GLUCOSE: 93 mg/dL (ref 65–99)
Potassium: 4.2 mmol/L (ref 3.5–5.1)
SODIUM: 137 mmol/L (ref 135–145)
Total Bilirubin: 1.2 mg/dL (ref 0.3–1.2)
Total Protein: 6.1 g/dL — ABNORMAL LOW (ref 6.5–8.1)

## 2016-01-24 LAB — URINALYSIS, ROUTINE W REFLEX MICROSCOPIC
BILIRUBIN URINE: NEGATIVE
GLUCOSE, UA: NEGATIVE mg/dL
Hgb urine dipstick: NEGATIVE
KETONES UR: NEGATIVE mg/dL
LEUKOCYTES UA: NEGATIVE
NITRITE: NEGATIVE
PROTEIN: NEGATIVE mg/dL
Specific Gravity, Urine: 1.011 (ref 1.005–1.030)
pH: 6 (ref 5.0–8.0)

## 2016-01-24 NOTE — Evaluation (Signed)
Occupational Therapy Assessment and Plan  Patient Details  Name: [name] MRN: [id number] Date of Birth: 1934/03/16  OT Diagnosis: apraxia, hemiplegia affecting non-dominant side and muscle weakness (generalized) Rehab Potential: Rehab Potential (ACUTE ONLY): Good ELOS: 7-10 days   Today's Date: 01/24/2016 OT Individual Time: 4098-1191 OT Individual Time Calculation (min): 60 min     Problem List:  Patient Active Problem List   Diagnosis Date Noted  . Acute ischemic right MCA stroke (HCC) 01/23/2016  . Gait disturbance, post-stroke   . Hemiparesis (HCC)   . Essential hypertension   . Dyslipidemia   . Thyroid activity decreased   . E-coli UTI   . Dyspepsia   . Dehydration   . Acute blood loss anemia   . Intracranial vascular stenosis   . Ischemic stroke (HCC) 01/19/2016  . Recurrent UTI 01/19/2016  . HLD (hyperlipidemia) 01/19/2016  . Intertrochanteric fracture of right hip (HCC) 03/23/2014  . HTN (hypertension) 03/23/2014  . Hypothyroidism 03/23/2014  . Anemia 03/23/2014  . Thrombocytopenia, unspecified (HCC) 03/23/2014  . Closed right hip fracture (HCC) 03/23/2014  . Pseudoaneurysm of right femoral artery (HCC) 01/31/2014  . Pseudoaneurysm of femoral artery (HCC) 01/30/2014  . Splenic artery aneurysm (HCC) 12/04/2013    Past Medical History:  Past Medical History  Diagnosis Date  . Hypertension   . High cholesterol   . Stroke [facility]) 1990's    "mild", denies residual on 01/28/2014  . Pneumonia     "maybe in my teens"  . Hypothyroidism   . Anemia   . History of blood transfusion     "S/P knee OR"  . Arthritis     "was in my knees"  . Recurrent UTI (urinary tract infection)    Past Surgical History:  Past Surgical History  Procedure Laterality Date  . Muscle biopsy Left 1990's    "knot biopsy from carrying mail bag for years"  . Bladder suspension  2012  . Splenic artery embolization  01/28/2014  . Joint replacement    . Total knee arthroplasty  Bilateral 2012  . Vaginal hysterectomy    . Revison of arteriovenous fistula Right 01/31/2014    Procedure: REPAIR PSEUDOANEURYSM;  Surgeon: Nada Libman, MD;  Location: [facility] OR;  Service: Vascular;  Laterality: Right;  Repair of Femoral Pseudoaneurysm.  . Femur im nail Right 03/23/2014    Procedure: INTRAMEDULLARY (IM) NAIL FEMORAL;  Surgeon: Shelda Pal, MD;  Location: [facility] ORS;  Service: Orthopedics;  Laterality: Right;  . Visceral angiogram N/A 01/28/2014    Procedure: VISCERAL ANGIOGRAM;  Surgeon: Fransisco Hertz, MD;  Location: [facility] CATH LAB;  Service: Cardiovascular;  Laterality: N/A;  . Embolization N/A 01/28/2014    Procedure: EMBOLIZATION;  Surgeon: Fransisco Hertz, MD;  Location: [facility] CATH LAB;  Service: Cardiovascular;  Laterality: N/A;  Splenic Artery    Assessment & Plan Clinical Impression: Patient is a 82 y.o. RH-female with history of HTN, OA, recurrent UTIs, CVA with left sided weakness with numbness and slurred speech at [facility] a week PTA with persistent left sided symptoms. She sustained a fall and struck her head on the toilet and was admitted for work up. She had been off plavix for a week for unclear reasons--question discontinued at discharged v/s stopped taking it. MRI brain showed mild local propagation of RIGHT MCA territory acute small infarcts and positive for moderate to severe stenosis bilateral PCA P2 segments and L-VA distal segment and posterior R-MCA M2 occlusion 8mm beyond its orign. Carotid dopplers without  significant stenosis. 2 D echo with EF 60-65% with mild concentric LVH. Dr. Roda Shutters recommended ASA/plavix for 3 months followed by plavix. She is to follow up with [facility] heart care for 30 day event monitor to rule out A fib due to M2 occlusion and avoid hypotension with SBP 130-150 range to allow for perfusion. She was found to have E coli UTI and placed on rocephin for treatment. Patient with resultant mild to moderate dysarthria, left sided weakness with decreased BOS as  well as difficulty with ADL tasks   Patient transferred to CIR on 01/23/2016 .    Patient currently requires min with basic self-care skills secondary to muscle weakness, impaired timing and sequencing, motor apraxia and decreased coordination, and decreased standing balance, decreased postural control and hemiplegia.  Prior to hospitalization, patient could complete ADL and IALDLs with independent .  Patient will benefit from skilled intervention to increase independence with basic self-care skills and increase level of independence with iADL prior to discharge home with care partner.  Anticipate patient will require intermittent supervision and follow up outpatient.  OT - End of Session Activity Tolerance: Tolerates 30+ min activity without fatigue Endurance Deficit: No OT Assessment Rehab Potential (ACUTE ONLY): Good OT Patient demonstrates impairments in the following area(s): Balance;Motor;Safety OT Basic ADL's Functional Problem(s): Eating;Grooming;Bathing;Dressing;Toileting OT Advanced ADL's Functional Problem(s): Simple Meal Preparation OT Transfers Functional Problem(s): Toilet;Tub/Shower OT Additional Impairment(s): Fuctional Use of Upper Extremity OT Plan OT Intensity: Minimum of 1-2 x/day, 45 to 90 minutes OT Frequency: 5 out of 7 days OT Duration/Estimated Length of Stay: 7-10 days OT Treatment/Interventions: Balance/vestibular training;Community reintegration;Discharge planning;Disease mangement/prevention;DME/adaptive equipment instruction;Functional mobility training;Neuromuscular re-education;Pain management;Patient/family education;Psychosocial support;Self Care/advanced ADL retraining;Therapeutic Activities;Therapeutic Exercise;UE/LE Strength taining/ROM;UE/LE Coordination activities OT Self Feeding Anticipated Outcome(s): Mod I OT Basic Self-Care Anticipated Outcome(s): Mod I OT Toileting Anticipated Outcome(s): Mod I OT Bathroom Transfers Anticipated Outcome(s): Mod I OT  Recommendation Patient destination: Home Follow Up Recommendations: Outpatient OT Equipment Recommended: None recommended by OT (Pt has all necessary equipment)   Skilled Therapeutic Intervention OT eval completed with discussion of rehab process, OT purpose, POC, goals, and ELOS.  ADL assessment completed with bathing at sit > stand level in room shower.  Completed toilet and walk-in shower transfers with min/steady assist ambulation without AD.  Noted pt to have decreased LUE strength and motor coordination with bathing, dropping wash cloth x2 when using Lt hand to attempt to wash Rt shoulder.  No clothes on eval, however pt donned and doffed hospital socks without difficulty, even bringing leg over knee to reach foot.  Grooming completed in standing with min guard/steady assist for standing balance.  Noted difficulty with Lt strength when applying toothpaste to toothbrush.    OT Evaluation Precautions/Restrictions  Precautions Precautions: Fall Restrictions Weight Bearing Restrictions: No General   Vital Signs Therapy Vitals Pulse Rate: 69 BP: 137/76 mmHg Pain Pain Assessment Pain Assessment: No/denies pain Pain Score: 0-No pain Home Living/Prior Functioning Home Living Available Help at Discharge: Family Type of Home: House Home Access: Ramped entrance Home Layout: One level Bathroom Shower/Tub: Psychologist, counselling, Curtain (built in Information systems manager) Bathroom Toilet: Handicapped height Bathroom Accessibility: Yes  Lives With: Son (son lives with pt) IADL History Homemaking Responsibilities: Yes Meal Prep Responsibility: Secondary Laundry Responsibility: Secondary Cleaning Responsibility: Secondary Homemaking Comments: pt and son share the responsibilities Current License: Yes Mode of Transportation: Car Occupation: Retired Prior Function Level of Independence: Independent with basic ADLs, Independent with homemaking with ambulation, Independent with gait Driving:  Yes Vocation:  Retired Leisure: Hobbies-yes (Comment) Comments: jigsaw puzzles, reading ADL  See Function Navigator Vision/Perception  Vision- History Baseline Vision/History: Wears glasses Wears Glasses: At all times Patient Visual Report: No change from baseline Vision- Assessment Vision Assessment?: Yes Eye Alignment: Within Functional Limits Ocular Range of Motion: Within Functional Limits Alignment/Gaze Preference: Within Defined Limits Tracking/Visual Pursuits: Able to track stimulus in all quads without difficulty Saccades: Within functional limits Visual Fields: No apparent deficits Depth Perception:  (WFL)  Cognition Overall Cognitive Status: Within Functional Limits for tasks assessed Arousal/Alertness: Awake/alert Orientation Level: Person;Place;Situation Person: Oriented Place: Oriented Situation: Oriented Year: 2017 Month: May Day of Week: Incorrect (Monday) Immediate Memory Recall: Sock;Blue;Bed Memory Recall: Sock;Blue;Bed Memory Recall Sock: Without Cue Memory Recall Blue: Without Cue Memory Recall Bed: Without Cue Safety/Judgment: Appears intact Sensation Sensation Light Touch: Appears Intact Stereognosis: Not tested Hot/Cold: Not tested Proprioception: Impaired by gross assessment Coordination Fine Motor Movements are Fluid and Coordinated: No Finger Nose Finger Test: Lt mild dysmetria with undershooting, slower than right Extremity/Trunk Assessment RUE Assessment RUE Assessment: Within Functional Limits (ROM WFL, strength grossly 5/5) LUE Assessment LUE Assessment: Within Functional Limits (ROM WFL, strength grossly 4/5, loose gross grasp)   See Function Navigator for Current Functional Status.   Refer to Care Plan for Long Term Goals  Recommendations for other services: None  Discharge Criteria: Patient will be discharged from OT if patient refuses treatment 3 consecutive times without medical reason, if treatment goals not met, if there is  a change in medical status, if patient makes no progress towards goals or if patient is discharged from hospital.  The above assessment, treatment plan, treatment alternatives and goals were discussed and mutually agreed upon: by patient  Rosalio Loud 01/24/2016, 10:26 AM

## 2016-01-24 NOTE — Progress Notes (Signed)
82 y.o. RH-female with history of HTN, OA, recurrent UTIs, CVA with left sided weakness with numbness and slurred speech at [facility] a week PTA with persistent left sided symptoms. She sustained a fall and struck her head on the toilet and was admitted for work up. She had been off plavix for a week for unclear reasons--question discontinued at discharged v/s stopped taking it. MRI brain showed mild local propagation of RIGHT MCA territory acute small infarcts and positive for moderate to severe stenosis bilateral PCA P2 segments and L-VA distal segment and posterior R-MCA M2 occlusion 26mm beyond its orign. Carotid dopplers without significant stenosis. 2 D echo with EF 60-65% with mild concentric LVH. Dr. Erlinda Hong recommended ASA/plavix for 3 months followed by plavix. She is to follow up with [facility] heart care for 30 day event monitor to rule out A fib due to M2 occlusion and avoid hypotension with SBP 130-150 range to allow for perfusion  Subjective/Complaints: No problems overnite  ROS- Negative CP, SOB , NVD  Objective: Vital Signs: Blood pressure 137/76, pulse 69, temperature 98 F (36.7 C), temperature source Oral, resp. rate 19, height 5\' 7"  (1.702 m), weight 91.899 kg (202 lb 9.6 oz), SpO2 97 %. No results found. Results for orders placed or performed during the hospital encounter of 01/23/16 (from the past 72 hour(s))  Urinalysis, Routine w reflex microscopic (not at [facility])     Status: None   Collection Time: 01/24/16  1:18 AM  Result Value Ref Range   Color, Urine YELLOW YELLOW   APPearance CLEAR CLEAR   Specific Gravity, Urine 1.011 1.005 - 1.030   pH 6.0 5.0 - 8.0   Glucose, UA NEGATIVE NEGATIVE mg/dL   Hgb urine dipstick NEGATIVE NEGATIVE   Bilirubin Urine NEGATIVE NEGATIVE   Ketones, ur NEGATIVE NEGATIVE mg/dL   Protein, ur NEGATIVE NEGATIVE mg/dL   Nitrite NEGATIVE NEGATIVE   Leukocytes, UA NEGATIVE NEGATIVE    Comment: MICROSCOPIC NOT DONE ON URINES WITH NEGATIVE PROTEIN, BLOOD,  LEUKOCYTES, NITRITE, OR GLUCOSE <1000 mg/dL.  CBC WITH DIFFERENTIAL     Status: Abnormal   Collection Time: 01/24/16  7:32 AM  Result Value Ref Range   WBC 5.9 4.0 - 10.5 K/uL   RBC 3.84 (L) 3.87 - 5.11 MIL/uL   Hemoglobin 11.9 (L) 12.0 - 15.0 g/dL   HCT 35.1 (L) 36.0 - 46.0 %   MCV 91.4 78.0 - 100.0 fL   MCH 31.0 26.0 - 34.0 pg   MCHC 33.9 30.0 - 36.0 g/dL   RDW 13.2 11.5 - 15.5 %   Platelets 150 150 - 400 K/uL   Neutrophils Relative % 54 %   Neutro Abs 3.2 1.7 - 7.7 K/uL   Lymphocytes Relative 30 %   Lymphs Abs 1.8 0.7 - 4.0 K/uL   Monocytes Relative 11 %   Monocytes Absolute 0.7 0.1 - 1.0 K/uL   Eosinophils Relative 4 %   Eosinophils Absolute 0.2 0.0 - 0.7 K/uL   Basophils Relative 1 %   Basophils Absolute 0.1 0.0 - 0.1 K/uL     HEENT: normal and left facial droop Cardio: RRR and occ skipped beats Resp: CTA B/L and unlabored GI: BS positive and NT, ND Extremity:  No Edema Skin:   Intact Neuro: Alert/Oriented, Flat, Normal Sensory, Abnormal Motor 3- in Left delt, bi, tri, grip,4- HF, KE ADF, 5/5 on RIght side, Abnormal FMC Ataxic/ dec FMC and Dysarthric Musc/Skel:  Other no pain with UE or LE ROM Gen NAD   Assessment/Plan:  1. Functional deficits secondary to Right MCA infarct which require 3+ hours per day of interdisciplinary therapy in a comprehensive inpatient rehab setting. Physiatrist is providing close team supervision and 24 hour management of active medical problems listed below. Physiatrist and rehab team continue to assess barriers to discharge/monitor patient progress toward functional and medical goals. FIM:       Function - Toileting Toileting steps completed by helper: Adjust clothing prior to toileting, Performs perineal hygiene, Adjust clothing after toileting Assist level: Supervision or verbal cues  Function - Toilet Transfers Toilet transfer assistive device: Bedside commode Assist level to toilet: Touching or steadying assistance (Pt >  75%) Assist level from toilet: Touching or steadying assistance (Pt > 75%) Assist level to bedside commode (at bedside): Supervision or verbal cues Assist level from bedside commode (at bedside): Supervision or verbal cues        Function - Comprehension Comprehension: Auditory Comprehension assist level: Understands complex 90% of the time/cues 10% of the time  Function - Expression Expression: Verbal Expression assist level: Expresses complex ideas: With no assist  Function - Social Interaction Social Interaction assist level: Interacts appropriately with others - No medications needed.  Function - Problem Solving Problem solving assist level: Solves complex 90% of the time/cues < 10% of the time  Function - Memory Memory assist level: Recognizes or recalls 90% of the time/requires cueing < 10% of the time Patient normally able to recall (first 3 days only): Current season, Staff names and faces, That he or she is in a hospital   Medical Problem List and Plan: 1.  Hemiparesis, post stroke gait disorder secondary to propagation of right MCA infarct. 2.  DVT Prophylaxis/Anticoagulation: Pharmaceutical: Lovenox 3. Pain Management: Tylenol prn 4. Mood: LCSW to follow for evaluation and support.   5. Neuropsych: This patient is capable of making decisions on her own behalf. 6. Skin/Wound Care: routine pressure relief measures.   7. Fluids/Electrolytes/Nutrition: Monitor I/O. Check lytes in am.   8. HTN: On labetalol 300 mg bid and lisinopril at bedtime. Hold Norvasc. Monitor BP tid--avoid hypotension with SBP 130-150 range 9. Dyslipidemia: On Zetia and Pravachol.   10. Hypothyroid: continue supplement 11. E coli UTI: Rocephin discontinued today --will recheck for efficacy with history of recurrent UTIs. Encourage fluid intake.    12. Dyspepsia: Add Pepcid.   13. Dehydration: Encourage fluid intake to avoid recurrence  and hypotension.   14. ABLA: Continue to monitor Hb  LOS  (Days) 1 A FACE TO FACE EVALUATION WAS PERFORMED  Tkeya Stencil E 01/24/2016, 8:10 AM

## 2016-01-24 NOTE — Evaluation (Signed)
Physical Therapy Assessment and Plan  Patient Details  Name: [name] MRN: [id number] Date of Birth: 02-May-1934  PT Diagnosis: Abnormal posture, Abnormality of gait, Difficulty walking, Hemiparesis non-dominant and Muscle weakness Rehab Potential: Good ELOS: 7-10 days   Today's Date: 01/24/2016 PT Individual Time: 3220-2542 PT Individual Time Calculation (min): 75 min    Problem List:  Patient Active Problem List   Diagnosis Date Noted  . Acute ischemic right MCA stroke (HCC) 01/23/2016  . Gait disturbance, post-stroke   . Hemiparesis (HCC)   . Essential hypertension   . Dyslipidemia   . Thyroid activity decreased   . E-coli UTI   . Dyspepsia   . Dehydration   . Acute blood loss anemia   . Intracranial vascular stenosis   . Ischemic stroke (HCC) 01/19/2016  . Recurrent UTI 01/19/2016  . HLD (hyperlipidemia) 01/19/2016  . Intertrochanteric fracture of right hip (HCC) 03/23/2014  . HTN (hypertension) 03/23/2014  . Hypothyroidism 03/23/2014  . Anemia 03/23/2014  . Thrombocytopenia, unspecified (HCC) 03/23/2014  . Closed right hip fracture (HCC) 03/23/2014  . Pseudoaneurysm of right femoral artery (HCC) 01/31/2014  . Pseudoaneurysm of femoral artery (HCC) 01/30/2014  . Splenic artery aneurysm (HCC) 12/04/2013    Past Medical History:  Past Medical History  Diagnosis Date  . Hypertension   . High cholesterol   . Stroke [facility]) 1990's    "mild", denies residual on 01/28/2014  . Pneumonia     "maybe in my teens"  . Hypothyroidism   . Anemia   . History of blood transfusion     "S/P knee OR"  . Arthritis     "was in my knees"  . Recurrent UTI (urinary tract infection)    Past Surgical History:  Past Surgical History  Procedure Laterality Date  . Muscle biopsy Left 1990's    "knot biopsy from carrying mail bag for years"  . Bladder suspension  2012  . Splenic artery embolization  01/28/2014  . Joint replacement    . Total knee arthroplasty Bilateral 2012   . Vaginal hysterectomy    . Revison of arteriovenous fistula Right 01/31/2014    Procedure: REPAIR PSEUDOANEURYSM;  Surgeon: Nada Libman, MD;  Location: [facility] OR;  Service: Vascular;  Laterality: Right;  Repair of Femoral Pseudoaneurysm.  . Femur im nail Right 03/23/2014    Procedure: INTRAMEDULLARY (IM) NAIL FEMORAL;  Surgeon: Shelda Pal, MD;  Location: [facility] ORS;  Service: Orthopedics;  Laterality: Right;  . Visceral angiogram N/A 01/28/2014    Procedure: VISCERAL ANGIOGRAM;  Surgeon: Fransisco Hertz, MD;  Location: [facility] CATH LAB;  Service: Cardiovascular;  Laterality: N/A;  . Embolization N/A 01/28/2014    Procedure: EMBOLIZATION;  Surgeon: Fransisco Hertz, MD;  Location: [facility] CATH LAB;  Service: Cardiovascular;  Laterality: N/A;  Splenic Artery    Assessment & Plan Clinical Impression: Patient is an 82 y.o. RH-female with history of HTN, OA, recurrent UTIs, CVA with left sided weakness with numbness and slurred speech at [facility] a week PTA with persistent left sided symptoms. She sustained a fall and struck her head on the toilet and was admitted for work up. She had been off plavix for a week for unclear reasons--question discontinued at discharged v/s stopped taking it. MRI brain showed mild local propagation of RIGHT MCA territory acute small infarcts and positive for moderate to severe stenosis bilateral PCA P2 segments and L-VA distal segment and posterior R-MCA M2 occlusion 8mm beyond its orign. Carotid dopplers without significant stenosis.  2 D echo with EF 60-65% with mild concentric LVH. Dr. Roda Shutters recommended ASA/plavix for 3 months followed by plavix. She is to follow up with [facility] heart care for 30 day event monitor to rule out A fib due to M2 occlusion and avoid hypotension with SBP 130-150 range to allow for perfusion. She was found to have E coli UTI and placed on rocephin for treatment. Patient with resultant mild to moderate dysarthria, left sided weakness with decreased BOS as well as  difficulty with ADL tasks  Patient transferred to CIR on 01/23/2016 .  Patient currently requires min with mobility secondary to muscle weakness, unbalanced muscle activation and decreased coordination and decreased sitting balance, decreased standing balance, decreased postural control, hemiplegia and decreased balance strategies.  Prior to hospitalization, patient was independent  with mobility and lived with Son (son lives with pt) in a House home.  Home access is  Ramped entrance.  Patient will benefit from skilled PT intervention to maximize safe functional mobility, minimize fall risk and decrease caregiver burden for planned discharge home with 24 hour supervision.  Anticipate patient will benefit from follow up HH at discharge.  PT - End of Session Activity Tolerance: Tolerates 30+ min activity with multiple rests Endurance Deficit: Yes Endurance Deficit Description: requires rest breaks after short distance ambulation PT Assessment Rehab Potential (ACUTE/IP ONLY): Good Barriers to Discharge: Decreased caregiver support;Inaccessible home environment PT Patient demonstrates impairments in the following area(s): Balance;Endurance;Motor;Safety;Sensory PT Transfers Functional Problem(s): Bed Mobility;Bed to Chair;Car;Furniture;Floor PT Locomotion Functional Problem(s): Ambulation;Stairs PT Plan PT Intensity: Minimum of 1-2 x/day ,45 to 90 minutes PT Frequency: 5 out of 7 days PT Duration Estimated Length of Stay: 7-10 days PT Treatment/Interventions: Ambulation/gait training;Balance/vestibular training;Community reintegration;Discharge planning;Disease management/prevention;Patient/family education;Neuromuscular re-education;Functional mobility training;Stair training;Therapeutic Activities;Therapeutic Exercise;UE/LE Strength taining/ROM;UE/LE Coordination activities PT Transfers Anticipated Outcome(s): mod I PT Locomotion Anticipated Outcome(s): mod I in home, S stairs and community  ambulation PT Recommendation Recommendations for Other Services: Speech consult Follow Up Recommendations: Home health PT Patient destination: Home Equipment Recommended: None recommended by PT  Skilled Therapeutic Intervention Pt received supine in bed, denies pain and agreeable to treatment. Initial PT evaluation performed and completed.  Performed all mobility with min guard as described below. Pt with significant dysarthria during session, difficulty managing secretions. Short step length and shuffled gait pattern, with LOB performing head turns due to decreased visual compensation for balance. Performed nustep for endurance and strengthening x10 min with BUE/BLE on level 7. Educated pt on role of therapy, goals at [facility] I/S level, estimated length of stay.  Returned to room with gait min guard x200'. Remained supine in bed at completion of session, alarm intact and all needs within reach.   PT Evaluation Precautions/Restrictions Precautions Precautions: Fall General Chart Reviewed: Yes Response to Previous Treatment: Patient reporting fatigue but able to participate. Family/Caregiver Present: No  Pain Pain Assessment Pain Assessment: No/denies pain Pain Score: 0-No pain Home Living/Prior Functioning Home Living Available Help at Discharge: Family Type of Home: House Home Access: Ramped entrance Home Layout: One level Bathroom Shower/Tub: Walk-in shower;Curtain (built in Information systems manager) Bathroom Toilet: Handicapped height Bathroom Accessibility: Yes  Lives With: Son (son lives with pt) Prior Function Level of Independence: Independent with basic ADLs;Independent with homemaking with ambulation;Independent with gait Driving: Yes Vocation: Retired Leisure: Hobbies-yes (Comment) Comments: jigsaw puzzles, reading Vision/Perception  Vision - Assessment Eye Alignment: Within Functional Limits Ocular Range of Motion: Within Functional Limits Alignment/Gaze Preference: Within Defined  Limits Tracking/Visual Pursuits: Able to track stimulus  in all quads without difficulty Saccades: Within functional limits  Perception: WFL Praxis: WFL Cognition Overall Cognitive Status: Within Functional Limits for tasks assessed Arousal/Alertness: Awake/alert Safety/Judgment: Appears intact Sensation Sensation Light Touch: Appears Intact Stereognosis: Not tested Hot/Cold: Not tested Proprioception: Impaired by gross assessment Coordination Fine Motor Movements are Fluid and Coordinated: No Finger Nose Finger Test: Lt mild dysmetria with undershooting, slower than right Motor  Motor Motor: Hemiplegia Motor - Skilled Clinical Observations: decreased coordination L extremities UE>LE, L hemiparesis  Mobility Bed Mobility Bed Mobility: Supine to Sit Supine to Sit: 5: Supervision Transfers Sit to Stand: 4: Min guard Locomotion  Ambulation Ambulation: Yes Ambulation/Gait Assistance: 4: Min guard Ambulation Distance (Feet): 200 Feet Assistive device: None Gait Gait: Yes Gait Pattern: Impaired Gait Pattern: Shuffle;Decreased stride length;Left flexed knee in stance;Right flexed knee in stance Gait velocity: 1.65 ft/sec Stairs / Additional Locomotion Stairs: Yes Stairs Assistance: 4: Min guard Stair Management Technique: One rail Right;Alternating pattern;Forwards Number of Stairs: 12 Height of Stairs: 3 Wheelchair Mobility Wheelchair Mobility: No  Trunk/Postural Assessment  Cervical Assessment Cervical Assessment: Exceptions to [facility] (forward head posture) Thoracic Assessment Thoracic Assessment: Exceptions to [facility] (increased thoracic kyphosis, rounded shoulders) Lumbar Assessment Lumbar Assessment: Exceptions to [facility] (posterior pelvic tilt, decreased lumbar lordosis) Postural Control Postural Control: Deficits on evaluation (decreased efficiency of stepping strategies, righting reactions)  Balance Balance Balance Assessed: Yes Standardized Balance  Assessment Standardized Balance Assessment: Berg Balance Test;Timed Up and Go Test Berg Balance Test Sit to Stand: Able to stand  independently using hands Standing Unsupported: Able to stand 2 minutes with supervision Sitting with Back Unsupported but Feet Supported on Floor or Stool: Able to sit safely and securely 2 minutes Stand to Sit: Controls descent by using hands Transfers: Able to transfer safely, definite need of hands Standing Unsupported with Eyes Closed: Able to stand 10 seconds with supervision Standing Ubsupported with Feet Together: Able to place feet together independently and stand for 1 minute with supervision From Standing, Reach Forward with Outstretched Arm: Reaches forward but needs supervision From Standing Position, Pick up Object from Floor: Able to pick up shoe, needs supervision From Standing Position, Turn to Look Behind Over each Shoulder: Looks behind one side only/other side shows less weight shift Turn 360 Degrees: Needs close supervision or verbal cueing Standing Unsupported, Alternately Place Feet on Step/Stool: Able to complete >2 steps/needs minimal assist Standing Unsupported, One Foot in Front: Needs help to step but can hold 15 seconds Standing on One Leg: Tries to lift leg/unable to hold 3 seconds but remains standing independently Total Score: 33 Timed Up and Go Test TUG: Normal TUG Normal TUG (seconds): 18 Static Sitting Balance Static Sitting - Balance Support: No upper extremity supported Static Sitting - Level of Assistance: 6: Modified independent (Device/Increase time) Dynamic Sitting Balance Dynamic Sitting - Balance Support: No upper extremity supported Dynamic Sitting - Level of Assistance: 5: Stand by assistance Dynamic Sitting - Balance Activities: Forward lean/weight shifting;Lateral lean/weight shifting;Reaching across midline Static Standing Balance Static Standing - Balance Support: No upper extremity supported Static Standing -  Level of Assistance: 5: Stand by assistance Static Stance: Eyes closed Static Stance: Eyes Closed: S x30 sec Dynamic Standing Balance Dynamic Standing - Balance Support: No upper extremity supported Dynamic Standing - Level of Assistance: 4: Min assist Dynamic Standing - Balance Activities: Forward lean/weight shifting;Lateral lean/weight shifting;Reaching across midline Extremity Assessment  RUE Assessment RUE Assessment: Within Functional Limits (ROM WFL, strength grossly 5/5) LUE Assessment LUE Assessment: Within Functional Limits (  ROM WFL, strength grossly 4/5, loose gross grasp) RLE Assessment RLE Assessment: Within Functional Limits LLE Assessment LLE Assessment: Exceptions to [facility] (hip flexion 4-/5, knee and ankle 4+/5)   See Function Navigator for Current Functional Status.   Refer to Care Plan for Long Term Goals  Recommendations for other services: Other: Speech  Discharge Criteria: Patient will be discharged from PT if patient refuses treatment 3 consecutive times without medical reason, if treatment goals not met, if there is a change in medical status, if patient makes no progress towards goals or if patient is discharged from hospital.  The above assessment, treatment plan, treatment alternatives and goals were discussed and mutually agreed upon: by patient  Vista Lawman 01/24/2016, 3:50 PM

## 2016-01-24 NOTE — Progress Notes (Signed)
82 y.o. RH-female with history of HTN, OA, recurrent UTIs, CVA with left sided weakness with numbness and slurred speech at [facility] a week PTA with persistent left sided symptoms. She sustained a fall and struck her head on the toilet and was admitted for work up. She had been off plavix for a week for unclear reasons--question discontinued at discharged v/s stopped taking it. MRI brain showed mild local propagation of RIGHT MCA territory acute small infarcts and positive for moderate to severe stenosis bilateral PCA P2 segments and L-VA distal segment and posterior R-MCA M2 occlusion 46mm beyond its orign. Carotid dopplers without significant stenosis. 2 D echo with EF 60-65% with mild concentric LVH. Dr. Erlinda Hong recommended ASA/plavix for 3 months followed by plavix. She is to follow up with [facility] heart care for 30 day event monitor to rule out A fib due to M2 occlusion and avoid hypotension with SBP 130-150 range to allow for perfusion  Subjective/Complaints:   Objective: Vital Signs: Blood pressure 137/76, pulse 69, temperature 98 F (36.7 C), temperature source Oral, resp. rate 19, height 5\' 7"  (1.702 m), weight 91.899 kg (202 lb 9.6 oz), SpO2 97 %. No results found. Results for orders placed or performed during the hospital encounter of 01/23/16 (from the past 72 hour(s))  Urinalysis, Routine w reflex microscopic (not at [facility])     Status: None   Collection Time: 01/24/16  1:18 AM  Result Value Ref Range   Color, Urine YELLOW YELLOW   APPearance CLEAR CLEAR   Specific Gravity, Urine 1.011 1.005 - 1.030   pH 6.0 5.0 - 8.0   Glucose, UA NEGATIVE NEGATIVE mg/dL   Hgb urine dipstick NEGATIVE NEGATIVE   Bilirubin Urine NEGATIVE NEGATIVE   Ketones, ur NEGATIVE NEGATIVE mg/dL   Protein, ur NEGATIVE NEGATIVE mg/dL   Nitrite NEGATIVE NEGATIVE   Leukocytes, UA NEGATIVE NEGATIVE    Comment: MICROSCOPIC NOT DONE ON URINES WITH NEGATIVE PROTEIN, BLOOD, LEUKOCYTES, NITRITE, OR GLUCOSE <1000 mg/dL.  CBC  WITH DIFFERENTIAL     Status: Abnormal   Collection Time: 01/24/16  7:32 AM  Result Value Ref Range   WBC 5.9 4.0 - 10.5 K/uL   RBC 3.84 (L) 3.87 - 5.11 MIL/uL   Hemoglobin 11.9 (L) 12.0 - 15.0 g/dL   HCT 35.1 (L) 36.0 - 46.0 %   MCV 91.4 78.0 - 100.0 fL   MCH 31.0 26.0 - 34.0 pg   MCHC 33.9 30.0 - 36.0 g/dL   RDW 13.2 11.5 - 15.5 %   Platelets 150 150 - 400 K/uL   Neutrophils Relative % 54 %   Neutro Abs 3.2 1.7 - 7.7 K/uL   Lymphocytes Relative 30 %   Lymphs Abs 1.8 0.7 - 4.0 K/uL   Monocytes Relative 11 %   Monocytes Absolute 0.7 0.1 - 1.0 K/uL   Eosinophils Relative 4 %   Eosinophils Absolute 0.2 0.0 - 0.7 K/uL   Basophils Relative 1 %   Basophils Absolute 0.1 0.0 - 0.1 K/uL     HEENT: normal and left facial droop Cardio: RRR and occ skipped beats Resp: CTA B/L and unlabored GI: BS positive and NT, ND Extremity:  No Edema Skin:   Intact Neuro: Alert/Oriented, Flat, Normal Sensory, Abnormal Motor 3- in Left delt, bi, tri, grip,4- HF, KE ADF, 5/5 on RIght side, Abnormal FMC Ataxic/ dec FMC and Dysarthric Musc/Skel:  Other no pain with UE or LE ROM Gen NAD   Assessment/Plan: 1. Functional deficits secondary to Right MCA infarct which  require 3+ hours per day of interdisciplinary therapy in a comprehensive inpatient rehab setting. Physiatrist is providing close team supervision and 24 hour management of active medical problems listed below. Physiatrist and rehab team continue to assess barriers to discharge/monitor patient progress toward functional and medical goals. FIM:       Function - Toileting Toileting steps completed by helper: Adjust clothing prior to toileting, Performs perineal hygiene, Adjust clothing after toileting Assist level: Supervision or verbal cues  Function - Toilet Transfers Toilet transfer assistive device: Bedside commode Assist level to toilet: Touching or steadying assistance (Pt > 75%) Assist level from toilet: Touching or steadying  assistance (Pt > 75%) Assist level to bedside commode (at bedside): Supervision or verbal cues Assist level from bedside commode (at bedside): Supervision or verbal cues        Function - Comprehension Comprehension: Auditory Comprehension assist level: Understands complex 90% of the time/cues 10% of the time  Function - Expression Expression: Verbal Expression assist level: Expresses complex ideas: With no assist  Function - Social Interaction Social Interaction assist level: Interacts appropriately with others - No medications needed.  Function - Problem Solving Problem solving assist level: Solves complex 90% of the time/cues < 10% of the time  Function - Memory Memory assist level: Recognizes or recalls 90% of the time/requires cueing < 10% of the time Patient normally able to recall (first 3 days only): Current season, Staff names and faces, That he or she is in a hospital   Medical Problem List and Plan: 1.  Hemiparesis, post stroke gait disorder secondary to propagation of right MCA infarct. 2.  DVT Prophylaxis/Anticoagulation: Pharmaceutical: Lovenox 3. Pain Management: Tylenol prn 4. Mood: LCSW to follow for evaluation and support.   5. Neuropsych: This patient is capable of making decisions on her own behalf. 6. Skin/Wound Care: routine pressure relief measures.   7. Fluids/Electrolytes/Nutrition: Monitor I/O. Check lytes in am.   8. HTN: On labetalol 300 mg bid and lisinopril at bedtime. Hold Norvasc. Monitor BP tid--avoid hypotension with SBP 130-150 range 9. Dyslipidemia: On Zetia and Pravachol.   10. Hypothyroid: continue supplement 11. E coli UTI: Rocephin discontinued today --will recheck for efficacy with history of recurrent UTIs. Encourage fluid intake.    12. Dyspepsia: Add Pepcid.   13. Dehydration: Encourage fluid intake to avoid recurrence  and hypotension.   14. ABLA: Continue to monitor Hb  LOS (Days) 1 A FACE TO FACE EVALUATION WAS  PERFORMED  KIRSTEINS,ANDREW E 01/24/2016, 7:59 AM

## 2016-01-24 NOTE — Progress Notes (Signed)
Patient information reviewed and entered into eRehab system by Sianne Tejada, RN, CRRN, PPS Coordinator.  Information including medical coding and functional independence measure will be reviewed and updated through discharge.     Per nursing patient was given "Data Collection Information Summary for Patients in Inpatient Rehabilitation Facilities with attached "Privacy Act Statement-Health Care Records" upon admission.  

## 2016-01-24 NOTE — Evaluation (Signed)
Speech Language Pathology Assessment and Plan  Patient Details  Name: [name] MRN: [id number] Date of Birth: 1934/05/06  SLP Diagnosis: Dysarthria  Rehab Potential: Good ELOS: 7-10 days     Today's Date: 01/24/2016 SLP Individual Time: 1100-1200 SLP Individual Time Calculation (min): 60 min   Problem List:  Patient Active Problem List   Diagnosis Date Noted  . Acute ischemic right MCA stroke (HCC) 01/23/2016  . Gait disturbance, post-stroke   . Hemiparesis (HCC)   . Essential hypertension   . Dyslipidemia   . Thyroid activity decreased   . E-coli UTI   . Dyspepsia   . Dehydration   . Acute blood loss anemia   . Intracranial vascular stenosis   . Ischemic stroke (HCC) 01/19/2016  . Recurrent UTI 01/19/2016  . HLD (hyperlipidemia) 01/19/2016  . Intertrochanteric fracture of right hip (HCC) 03/23/2014  . HTN (hypertension) 03/23/2014  . Hypothyroidism 03/23/2014  . Anemia 03/23/2014  . Thrombocytopenia, unspecified (HCC) 03/23/2014  . Closed right hip fracture (HCC) 03/23/2014  . Pseudoaneurysm of right femoral artery (HCC) 01/31/2014  . Pseudoaneurysm of femoral artery (HCC) 01/30/2014  . Splenic artery aneurysm (HCC) 12/04/2013   Past Medical History:  Past Medical History  Diagnosis Date  . Hypertension   . High cholesterol   . Stroke [facility]) 1990's    "mild", denies residual on 01/28/2014  . Pneumonia     "maybe in my teens"  . Hypothyroidism   . Anemia   . History of blood transfusion     "S/P knee OR"  . Arthritis     "was in my knees"  . Recurrent UTI (urinary tract infection)    Past Surgical History:  Past Surgical History  Procedure Laterality Date  . Muscle biopsy Left 1990's    "knot biopsy from carrying mail bag for years"  . Bladder suspension  2012  . Splenic artery embolization  01/28/2014  . Joint replacement    . Total knee arthroplasty Bilateral 2012  . Vaginal hysterectomy    . Revison of arteriovenous fistula Right 01/31/2014     Procedure: REPAIR PSEUDOANEURYSM;  Surgeon: Nada Libman, MD;  Location: [facility] OR;  Service: Vascular;  Laterality: Right;  Repair of Femoral Pseudoaneurysm.  . Femur im nail Right 03/23/2014    Procedure: INTRAMEDULLARY (IM) NAIL FEMORAL;  Surgeon: Shelda Pal, MD;  Location: [facility] ORS;  Service: Orthopedics;  Laterality: Right;  . Visceral angiogram N/A 01/28/2014    Procedure: VISCERAL ANGIOGRAM;  Surgeon: Fransisco Hertz, MD;  Location: [facility] CATH LAB;  Service: Cardiovascular;  Laterality: N/A;  . Embolization N/A 01/28/2014    Procedure: EMBOLIZATION;  Surgeon: Fransisco Hertz, MD;  Location: [facility] CATH LAB;  Service: Cardiovascular;  Laterality: N/A;  Splenic Artery    Assessment / Plan / Recommendation Clinical Impression   [name] is a 82 y.o. who sustained a fall and struck her head on the toilet and was admitted for work up.  MRI brain showed mild local propagation of RIGHT MCA territory acute small infarcts.  Pt admitted to CIR on 01/23/2016.  SLP evaluation completed on 01/24/2016 with the following results:  Pt presents with a mild-moderate dysarthria characterized by imprecise articulation of consonants resulting from left sided oral motor weakness which impacts pt's intelligibility in conversations.  Pt's cognition is grossly [facility] for all tasks assessed.  A bedside swallow evaluation was also administered due to pt reports of drooling during meals and difficulty chewing solids.  Pt demonstrated anterior  labial loss of purees due to decreased labial seal on left side.  Pt also exhibits prolonged mastication of solids; however, pt was able to compensate for oral motor weakness during presentations of all consistencies with mod I.  No overt s/s of aspiration evident with solids or liquids.  No further ST needs indicated for dysphagia.  Pt would benefit from skilled ST while inpatient for dysarthria in order to maximize functional independence and reduce burden of care prior to discharge.   Anticipate that pt would benefit from outpatient ST follow up for next level of care.      Skilled Therapeutic Interventions          Cognitive-linguistic and bedside swallowing evaluation completed with results and recommendations reviewed with patient.     SLP Assessment  Patient will need skilled Speech Lanaguage Pathology Services during CIR admission    Recommendations  Patient destination: Home Follow up Recommendations: Outpatient SLP Equipment Recommended: None recommended by SLP    SLP Frequency 3 to 5 out of 7 days   SLP Duration  SLP Intensity  SLP Treatment/Interventions 7-10 days   Minumum of 1-2 x/day, 30 to 90 minutes  Cueing hierarchy;Internal/external aids;Patient/family education    Pain Pain Assessment Pain Assessment: No/denies pain  Prior Functioning Cognitive/Linguistic Baseline: Within functional limits Type of Home: House  Lives With: Son Available Help at Discharge: Family Vocation: Retired  Function:  Eating Eating   Modified Consistency Diet: No Eating Assist Level: No help, No cues           Cognition Comprehension Comprehension assist level: Follows complex conversation/direction with extra time/assistive device  Expression   Expression assist level: Expresses basic 90% of the time/requires cueing < 10% of the time.  Social Interaction Social Interaction assist level: Interacts appropriately with others - No medications needed.  Problem Solving Problem solving assist level: Solves complex problems: With extra time  Memory Memory assist level: More than reasonable amount of time   Short Term Goals: Week 1: SLP Short Term Goal 1 (Week 1): STG=LTG due to ELOS   Refer to Care Plan for Long Term Goals  Recommendations for other services: None  Discharge Criteria: Patient will be discharged from SLP if patient refuses treatment 3 consecutive times without medical reason, if treatment goals not met, if there is a change in medical  status, if patient makes no progress towards goals or if patient is discharged from hospital.  The above assessment, treatment plan, treatment alternatives and goals were discussed and mutually agreed upon: by patient  Maryjane Hurter 01/24/2016, 4:12 PM

## 2016-01-25 ENCOUNTER — Inpatient Hospital Stay (HOSPITAL_COMMUNITY): Payer: Medicare Other | Admitting: Occupational Therapy

## 2016-01-25 ENCOUNTER — Inpatient Hospital Stay (HOSPITAL_COMMUNITY): Payer: Medicare Other | Admitting: Physical Therapy

## 2016-01-25 ENCOUNTER — Inpatient Hospital Stay (HOSPITAL_COMMUNITY): Payer: Medicare Other | Admitting: *Deleted

## 2016-01-25 ENCOUNTER — Inpatient Hospital Stay (HOSPITAL_COMMUNITY): Payer: Medicare Other | Admitting: Speech Pathology

## 2016-01-25 LAB — URINE CULTURE

## 2016-01-25 NOTE — Progress Notes (Signed)
Speech Language Pathology Daily Session Note  Patient Details  Name: [name] MRN: [id number] Date of Birth: 08-24-1934  Today's Date: 01/25/2016 SLP Individual Time: 0830-0900 SLP Individual Time Calculation (min): 30 min  Short Term Goals: Week 1: SLP Short Term Goal 1 (Week 1): STG=LTG due to ELOS   Skilled Therapeutic Interventions:  Pt was seen for skilled ST targeting communication goals.  SLP facilitated the session with skilled education regarding intelligibility strategies to improve articulatory precision in conversations.  Pt utilized strategies to achieve intelligibility in phrases/conversations during a verbal description task with min assist verbal cues which SLP was eventually able to fade to supervision level cues in a moderately distracting environment.  Pt was returned to room and left in bed with call bell within reach.    Function:  Eating Eating                 Cognition Comprehension Comprehension assist level: Follows complex conversation/direction with extra time/assistive device  Expression   Expression assist level: Expresses basic 90% of the time/requires cueing < 10% of the time.  Social Interaction Social Interaction assist level: Interacts appropriately with others - No medications needed.  Problem Solving Problem solving assist level: Solves complex problems: With extra time  Memory Memory assist level: More than reasonable amount of time    Pain Pain Assessment Pain Assessment: No/denies pain Pain Score: 0-No pain  Therapy/Group: Individual Therapy  Kenia Teagarden, Selinda Orion 01/25/2016, 11:07 AM

## 2016-01-25 NOTE — Progress Notes (Signed)
Social Work Patient ID: [name], female   DOB: Feb 02, 1934, 82 y.o.   MRN: [id number] Met with pt to inform team conference goals mod/i-supervision level and discharge 5/24. Pt reports being tired form am therapies. She states: " They worked me hard, I need a rest." She is agreeable to the team's recommendations and plan. She can see her progress which makes her feel good. Will have Sonia Baller follow up with tomorrow.

## 2016-01-25 NOTE — Progress Notes (Signed)
Physical Therapy Session Note  Patient Details  Name: [name] MRN: [id number] Date of Birth: 1934-02-13  Today's Date: 01/25/2016 PT Individual Time: 0900-1000 and 1445-1530 PT Individual Time Calculation (min): 60 min and 45 min (total 105 min)   Short Term Goals: Week 1:  PT Short Term Goal 1 (Week 1): =LTG due to estimated LOS  Skilled Therapeutic Interventions/Progress Updates:    Tx 1: Co-treat with recreational therapist. Pt received seated on EOB with handoff from SLP; denies pain and agreeable to treatment. Gait to/from gym x150' each with min guard. Mild LOB when ambulating in room due to obstacles, cognitive distractions requiring minA to recover. Dynamic gait with ball kicks in static standing and with forward walking for coordination, stepping strategy, reactive balance. Backwards walking and sidestepping with ball toss for continued balance challenges with cognitive dual task. Standing rocker board oriented medial/lateral with min guard and occasional use of UEs; progressed to no UEs with external perturbation to board. Returned to room as above; remained seated in recliner with all needs in reach at completion of session.   Tx 2: Pt received supine in bed, denies pain and agreeable to treatment. Gait into/out of bathroom with min guard. Performed hygiene and clothing management with S and occasional use of grab bar. Standing at sink, hand hygiene with S. Gait to/from gym x150' each with min guard/S. Biodex catch game for focus on weight shifting, limits of stability, ankle strategy for balance. Several posterior LOBs requiring use of UEs and modA from therapist to recover. Kinetron in standing 2 x 1 min for weight shifting, cues for hip external rotation to maintain B knee alignment. Gait to return to room; max verbal cues to locate room using signs. Remained supine in bed at completion of session, alarm intact and all needs within reach.   Therapy Documentation Precautions:   Precautions Precautions: Fall Restrictions Weight Bearing Restrictions: No Pain: Pain Assessment Pain Assessment: No/denies pain Pain Score: 0-No pain   See Function Navigator for Current Functional Status.   Therapy/Group: Individual Therapy  [name] 01/25/2016, 3:36 PM

## 2016-01-25 NOTE — Patient Care Conference (Signed)
Inpatient RehabilitationTeam Conference and Plan of Care Update Date: 01/25/2016   Time: 11:25 AM    Patient Name: [name]      Medical Record Number: [id number]  Date of Birth: Aug 21, 1934 Sex: Female         Room/Bed: 4W20C/4W20C-01 Payor Info: Payor: MEDICARE / Plan: MEDICARE PART A AND B / Product Type: *No Product type* /    Admitting Diagnosis: CVA  Admit Date/Time:  01/23/2016  4:29 PM Admission Comments: No comment available   Primary Diagnosis:  <principal problem not specified> Principal Problem: <principal problem not specified>  Patient Active Problem List   Diagnosis Date Noted  . Acute ischemic right MCA stroke (HCC) 01/23/2016  . Gait disturbance, post-stroke   . Hemiparesis (HCC)   . Essential hypertension   . Dyslipidemia   . Thyroid activity decreased   . E-coli UTI   . Dyspepsia   . Dehydration   . Acute blood loss anemia   . Intracranial vascular stenosis   . Ischemic stroke (HCC) 01/19/2016  . Recurrent UTI 01/19/2016  . HLD (hyperlipidemia) 01/19/2016  . Intertrochanteric fracture of right hip (HCC) 03/23/2014  . HTN (hypertension) 03/23/2014  . Hypothyroidism 03/23/2014  . Anemia 03/23/2014  . Thrombocytopenia, unspecified (HCC) 03/23/2014  . Closed right hip fracture (HCC) 03/23/2014  . Pseudoaneurysm of right femoral artery (HCC) 01/31/2014  . Pseudoaneurysm of femoral artery (HCC) 01/30/2014  . Splenic artery aneurysm (HCC) 12/04/2013    Expected Discharge Date: Expected Discharge Date: 02/01/16  Team Members Present: Physician leading conference: Dr. Claudette Laws Social Worker Present: Dossie Der, LCSW Nurse Present: Chana Bode, RN PT Present: Alyson Reedy, Nita Sickle, PT OT Present: Rosalio Loud, OT SLP Present: Jackalyn Lombard, SLP PPS Coordinator present : Tora Duck, RN, CRRN     Current Status/Progress Goal Weekly Team Focus  Medical   Patient ambulating with assistance. Facial droop with drooling.   Home discharge with family support  Discharge planning   Bowel/Bladder   Continent of bowel and bladder  Remain continent, prevent constipation  Monitor and toilet, as appropriate   Swallow/Nutrition/ Hydration             ADL's   Min assist overall  Mod I overall  LUE NMR, dynamic standing balance, home making tasks   Mobility   min guard transfers, gait, stairs, minA higher level balance  mod I in home, S stairs and community gait  dynamic standing balance, gait training, endurance   Communication   mild dysarthria   independent  education and carryover of intelligibility strategies    Safety/Cognition/ Behavioral Observations            Pain   No complaints of pain at present time  Adequate pain relief, per patient perception  Continue to monitor and provide interventions for expressed pain as indicated   Skin   No alteration in skin integrity noted  Maintain skin integrity, prevent breakdown  Monitor and assess skin qshift/prn, implement interventions as needed    Rehab Goals Patient on target to meet rehab goals: Yes Rehab Goals Revised: none - pt's first conference *See Care Plan and progress notes for long and short-term goals.  Barriers to Discharge: Still has balance issues    Possible Resolutions to Barriers:  Training with assistive device, improve safety awareness    Discharge Planning/Teaching Needs:  Pt plans to return to his home with her son there for assistance, as needed.  Pt's son can come for family education closer to  d/c.   Team Discussion:  Goals mod/i level-supervision stairs and community gait. Drooling and concerned about this-SP working on. Distractible but attention is improving. Medically doing well and adjusting medications.   Revisions to Treatment Plan:  None   Continued Need for Acute Rehabilitation Level of Care: The patient requires daily medical management by a physician with specialized training in physical medicine and rehabilitation  for the following conditions: Daily direction of a multidisciplinary physical rehabilitation program to ensure safe treatment while eliciting the highest outcome that is of practical value to the patient.: Yes Daily medical management of patient stability for increased activity during participation in an intensive rehabilitation regime.: Yes Daily analysis of laboratory values and/or radiology reports with any subsequent need for medication adjustment of medical intervention for : Neurological problems  [name], [name] 01/26/2016, 11:37 AM

## 2016-01-25 NOTE — Progress Notes (Signed)
82 y.o. RH-female with history of HTN, OA, recurrent UTIs, CVA with left sided weakness with numbness and slurred speech at [facility] a week PTA with persistent left sided symptoms. She sustained a fall and struck her head on the toilet and was admitted for work up. She had been off plavix for a week for unclear reasons--question discontinued at discharged v/s stopped taking it. MRI brain showed mild local propagation of RIGHT MCA territory acute small infarcts and positive for moderate to severe stenosis bilateral PCA P2 segments and L-VA distal segment and posterior R-MCA M2 occlusion 34m beyond its orign. Carotid dopplers without significant stenosis. 2 D echo with EF 60-65% with mild concentric LVH. Dr. XErlinda Hongrecommended ASA/plavix for 3 months followed by plavix. [facility] 30 day event monitor to rule out A fib due to M2 occlusion and avoid hypotension with SBP 130-150 range to allow for perfusion  Subjective/Complaints: Ambulating in hallway with occupational therapy No new issues overnight Left facial droop with intermittent drooling   ROS- Negative CP, SOB , NVD, Denies any problems with bowel or bladder  Objective: Vital Signs: Blood pressure 149/82, pulse 60, temperature 97.6 F (36.4 C), temperature source Oral, resp. rate 18, height 5' 7"  (1.702 m), weight 91.899 kg (202 lb 9.6 oz), SpO2 95 %. No results found. Results for orders placed or performed during the hospital encounter of 01/23/16 (from the past 72 hour(s))  Urinalysis, Routine w reflex microscopic ([facility]     Status: None   Collection Time: 01/24/16  1:18 AM  Result Value Ref Range   Color, Urine YELLOW YELLOW   APPearance CLEAR CLEAR   Specific Gravity, Urine 1.011 1.005 - 1.030   pH 6.0 5.0 - 8.0   Glucose, UA NEGATIVE NEGATIVE mg/dL   Hgb urine dipstick NEGATIVE NEGATIVE   Bilirubin Urine NEGATIVE NEGATIVE   Ketones, ur NEGATIVE NEGATIVE mg/dL   Protein, ur NEGATIVE NEGATIVE  mg/dL   Nitrite NEGATIVE NEGATIVE   Leukocytes, UA NEGATIVE NEGATIVE    Comment: MICROSCOPIC NOT DONE ON URINES WITH NEGATIVE PROTEIN, BLOOD, LEUKOCYTES, NITRITE, OR GLUCOSE <1000 mg/dL.  CBC WITH DIFFERENTIAL     Status: Abnormal   Collection Time: 01/24/16  7:32 AM  Result Value Ref Range   WBC 5.9 4.0 - 10.5 K/uL   RBC 3.84 (L) 3.87 - 5.11 MIL/uL   Hemoglobin 11.9 (L) 12.0 - 15.0 g/dL   HCT 35.1 (L) 36.0 - 46.0 %   MCV 91.4 78.0 - 100.0 fL   MCH 31.0 26.0 - 34.0 pg   MCHC 33.9 30.0 - 36.0 g/dL   RDW 13.2 11.5 - 15.5 %   Platelets 150 150 - 400 K/uL   Neutrophils Relative % 54 %   Neutro Abs 3.2 1.7 - 7.7 K/uL   Lymphocytes Relative 30 %   Lymphs Abs 1.8 0.7 - 4.0 K/uL   Monocytes Relative 11 %   Monocytes Absolute 0.7 0.1 - 1.0 K/uL   Eosinophils Relative 4 %   Eosinophils Absolute 0.2 0.0 - 0.7 K/uL   Basophils Relative 1 %   Basophils Absolute 0.1 0.0 - 0.1 K/uL  Comprehensive metabolic panel     Status: Abnormal   Collection Time: 01/24/16  7:32 AM  Result Value Ref Range   Sodium 137 135 - 145 mmol/L   Potassium 4.2 3.5 - 5.1 mmol/L   Chloride 102 101 - 111 mmol/L   CO2 22 22 - 32 mmol/L  Glucose, Bld 93 65 - 99 mg/dL   BUN 15 6 - 20 mg/dL   Creatinine, Ser 0.86 0.44 - 1.00 mg/dL   Calcium 10.1 8.9 - 10.3 mg/dL   Total Protein 6.1 (L) 6.5 - 8.1 g/dL   Albumin 3.8 3.5 - 5.0 g/dL   AST 66 (H) 15 - 41 U/L   ALT 64 (H) 14 - 54 U/L   Alkaline Phosphatase 63 38 - 126 U/L   Total Bilirubin 1.2 0.3 - 1.2 mg/dL   GFR calc non Af Amer >60 >60 mL/min   GFR calc Af Amer >60 >60 mL/min    Comment: (NOTE) The eGFR has been calculated using the CKD EPI equation. This calculation has not been validated in all clinical situations. eGFR's persistently <60 mL/min signify possible Chronic Kidney Disease.    Anion gap 13 5 - 15     HEENT: normal and left facial droop Cardio: RRR and occ skipped beats Resp: CTA B/L and unlabored GI: BS positive and NT, ND Extremity:  No  Edema Skin:   Intact Neuro: Alert/Oriented, Flat, Normal Sensory, Abnormal Motor 3- in Left delt, bi, tri, grip,4- HF, KE ADF, 5/5 on RIght side, Abnormal FMC Ataxic/ dec FMC and Dysarthric Musc/Skel:  Other no pain with UE or LE ROM Gen NAD   Assessment/Plan: 1. Functional deficits secondary to Right MCA infarct which require 3+ hours per day of interdisciplinary therapy in a comprehensive inpatient rehab setting. Physiatrist is providing close team supervision and 24 hour management of active medical problems listed below. Physiatrist and rehab team continue to assess barriers to discharge/monitor patient progress toward functional and medical goals. FIM: Function - Bathing Position: Shower Body parts bathed by patient: Right arm, Left arm, Chest, Abdomen, Front perineal area, Right upper leg, Buttocks, Left upper leg, Right lower leg, Left lower leg, Back Assist Level: Touching or steadying assistance(Pt > 75%)  Function- Upper Body Dressing/Undressing What is the patient wearing?: Pull over shirt/dress Pull over shirt/dress - Perfomed by patient: Thread/unthread right sleeve, Thread/unthread left sleeve, Put head through opening, Pull shirt over trunk Assist Level: Supervision or verbal cues Set up : To obtain clothing/put away Function - Lower Body Dressing/Undressing What is the patient wearing?: Pants Position: Sitting EOB Pants- Performed by patient: Thread/unthread right pants leg, Thread/unthread left pants leg Pants- Performed by helper: Pull pants up/down Non-skid slipper socks- Performed by patient: Don/doff right sock, Don/doff left sock Assist for footwear: Setup Assist for lower body dressing: Set up Set up : To obtain clothing/put away  Function - Toileting Toileting steps completed by patient: Adjust clothing prior to toileting, Performs perineal hygiene, Adjust clothing after toileting Toileting steps completed by helper: Adjust clothing prior to toileting,  Performs perineal hygiene, Adjust clothing after toileting Toileting Assistive Devices: Grab bar or rail Assist level: Supervision or verbal cues  Function - Toilet Transfers Toilet transfer assistive device: Elevated toilet seat/BSC over toilet, Grab bar Assist level to toilet: Touching or steadying assistance (Pt > 75%) Assist level from toilet: Touching or steadying assistance (Pt > 75%) Assist level to bedside commode (at bedside): Supervision or verbal cues Assist level from bedside commode (at bedside): Supervision or verbal cues  Function - Chair/bed transfer Chair/bed transfer method: Ambulatory Chair/bed transfer assist level: Touching or steadying assistance (Pt > 75%) Chair/bed transfer assistive device: Armrests Chair/bed transfer details: Tactile cues for posture, Verbal cues for precautions/safety  Function - Locomotion: Wheelchair Will patient use wheelchair at discharge?: No Function - Locomotion: Ambulation Assistive  device: No device Max distance: 200 Assist level: Touching or steadying assistance (Pt > 75%) Assist level: Touching or steadying assistance (Pt > 75%) Assist level: Touching or steadying assistance (Pt > 75%) Assist level: Touching or steadying assistance (Pt > 75%) Assist level: Touching or steadying assistance (Pt > 75%)  Function - Comprehension Comprehension: Auditory, Visual Comprehension assist level: Understands complex 90% of the time/cues 10% of the time  Function - Expression Expression: Verbal Expression assist level: Expresses basic 90% of the time/requires cueing < 10% of the time.  Function - Social Interaction Social Interaction assist level: Interacts appropriately with others - No medications needed.  Function - Problem Solving Problem solving assist level: Solves complex problems: With extra time  Function - Memory Memory assist level: More than reasonable amount of time Patient normally able to recall (first 3 days only):  Current season, Location of own room, Staff names and faces, That he or she is in a hospital   Medical Problem List and Plan: 1.  Hemiparesis, post stroke gait disorder secondary to propagation of right MCA infarct.Team conference today please see physician documentation under team conference tab, met with team face-to-face to discuss problems,progress, and goals. Formulized individual treatment plan based on medical history, underlying problem and comorbidities. 2.  DVT Prophylaxis/Anticoagulation: Pharmaceutical: Lovenox 3. Pain Management: Tylenol prn 4. Mood: LCSW to follow for evaluation and support.   5. Neuropsych: This patient is capable of making decisions on her own behalf. 6. Skin/Wound Care: routine pressure relief measures.   7. Fluids/Electrolytes/Nutrition: Monitor I/O. Check lytes in am.   8. HTN: On labetalol 300 mg bid and lisinopril at bedtime. Hold Norvasc. Monitor BP tid--avoid hypotension with SBP 130-150 range Filed Vitals:   01/25/16 0508 01/25/16 0853  BP: 149/82 140/88  Pulse: 60 62  Temp: 97.6 F (36.4 C)   Resp: 18    9. Dyslipidemia: On Zetia and Pravachol.   10. Hypothyroid: continue supplement 11. E coli UTI: Rocephin discontinued  -recheck UA negative 12. Dyspepsia: Add Pepcid.   13. Dehydration: Recheck BUN/creatinine 5/16 were normal 14. ABLA: Continue to monitor Hb  LOS (Days) 2 A FACE TO FACE EVALUATION WAS PERFORMED  Karis Emig E 01/25/2016, 7:41 AM

## 2016-01-25 NOTE — Progress Notes (Signed)
Recreational Therapy Assessment and Plan  Patient Details  Name: [name] MRN: [id number] Date of Birth: 04-16-1934 Today's Date: 01/25/2016  Rehab Potential: Good ELOS: 10 days   Assessment Clinical Impression:  Problem List:  Patient Active Problem List   Diagnosis Date Noted  . Acute ischemic right MCA stroke (HCC) 01/23/2016  . Gait disturbance, post-stroke   . Hemiparesis (HCC)   . Essential hypertension   . Dyslipidemia   . Thyroid activity decreased   . E-coli UTI   . Dyspepsia   . Dehydration   . Acute blood loss anemia   . Intracranial vascular stenosis   . Ischemic stroke (HCC) 01/19/2016  . Recurrent UTI 01/19/2016  . HLD (hyperlipidemia) 01/19/2016  . Intertrochanteric fracture of right hip (HCC) 03/23/2014  . HTN (hypertension) 03/23/2014  . Hypothyroidism 03/23/2014  . Anemia 03/23/2014  . Thrombocytopenia, unspecified (HCC) 03/23/2014  . Closed right hip fracture (HCC) 03/23/2014  . Pseudoaneurysm of right femoral artery (HCC) 01/31/2014  . Pseudoaneurysm of femoral artery (HCC) 01/30/2014  . Splenic artery aneurysm (HCC) 12/04/2013    Past Medical History:  Past Medical History  Diagnosis Date  . Hypertension   . High cholesterol   . Stroke [facility]) 1990's    "mild", denies residual on 01/28/2014  . Pneumonia     "maybe in my teens"  . Hypothyroidism   . Anemia   . History of blood transfusion     "S/P knee OR"  . Arthritis     "was in my knees"  . Recurrent UTI (urinary tract infection)    Past Surgical History:  Past Surgical History  Procedure Laterality Date  . Muscle biopsy Left 1990's    "knot biopsy from carrying mail bag for years"  . Bladder suspension  2012  . Splenic artery embolization  01/28/2014  . Joint replacement    . Total knee arthroplasty Bilateral 2012  . Vaginal  hysterectomy    . Revison of arteriovenous fistula Right 01/31/2014    Procedure: REPAIR PSEUDOANEURYSM; Surgeon: Nada Libman, MD; Location: [facility] OR; Service: Vascular; Laterality: Right; Repair of Femoral Pseudoaneurysm.  . Femur im nail Right 03/23/2014    Procedure: INTRAMEDULLARY (IM) NAIL FEMORAL; Surgeon: Shelda Pal, MD; Location: [facility] ORS; Service: Orthopedics; Laterality: Right;  . Visceral angiogram N/A 01/28/2014    Procedure: VISCERAL ANGIOGRAM; Surgeon: Fransisco Hertz, MD; Location[facility] CATH LAB; Service: Cardiovascular; Laterality: N/A;  . Embolization N/A 01/28/2014    Procedure: EMBOLIZATION; Surgeon: Fransisco Hertz, MD; Location[facility] C[facility] LAB; Service: Cardiovascular; Laterality: N/A; Splenic Artery    Assessment & Plan Clinical Impression: Patient is a 82 y.o. RH-female with history of HTN, OA, recurrent UTIs, CVA with left sided weakness with numbness and slurred speech at [facility] a week PTA with persistent left sided symptoms. She sustained a fall and struck her head on the toilet and was admitted for work up. She had been off plavix for a week for unclear reasons--question discontinued at discharged v/s stopped taking it. MRI brain showed mild local propagation of RIGHT MCA territory acute small infarcts and positive for moderate to severe stenosis bilateral PCA P2 segments and L-VA distal segment and posterior R-MCA M2 occlusion 8mm beyond its orign. Carotid dopplers without significant stenosis. 2 D echo with EF 60-65% with mild concentric LVH. Dr. Roda Shutters recommended ASA/plavix for 3 months followed by plavix. She is to follow up with [facility] heart care for 30 day event monitor to rule out A fib due to M2  occlusion and avoid hypotension with SBP 130-150 range to allow for perfusion. She was found to have E coli UTI and placed on rocephin for treatment. Patient with resultant mild to moderate dysarthria, left sided weakness with decreased BOS  as well as difficulty with ADL tasks  Patient transferred to CIR on 01/23/2016.     Pt presents with decreased activity tolerance, decreased functional mobility, decreased balance, decreased coordination Limiting pt's independence with leisure/community pursuits.   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 Other Leisure Interests: Television;Cooking/Baking;Housework Leisure Participation Style: With Family/Friends Awareness of Community Resources: Good-identify 3 post discharge leisure resources Psychosocial / Spiritual Social interaction - Mood/Behavior: Cooperative Firefighter Appropriate for Education?: Yes Recreational Therapy Orientation Orientation -Reviewed with patient: Available activity resources Strengths/Weaknesses Patient Strengths/Abilities: Willingness to participate;Active premorbidly Patient weaknesses: Physical limitations TR Patient demonstrates impairments in the following area(s): Endurance;Motor;Safety  Plan Rec Therapy Plan Is patient appropriate for Therapeutic Recreation?: Yes Rehab Potential: Good Treatment times per week: Min 1 time per week >20 minutes Estimated Length of Stay: 10 days TR Treatment/Interventions: Adaptive equipment instruction;1:1 session;Balance/vestibular training;Functional mobility training;Community reintegration;Patient/family education;Therapeutic activities;Recreation/leisure participation;Therapeutic exercise;UE/LE Coordination activities  Recommendations for other services: None  Discharge Criteria: Patient will be discharged from TR if patient refuses treatment 3 consecutive times without medical reason.  If treatment goals not met, if there is a change in medical status, if patient makes no progress towards goals or if patient is discharged from hospital.  The above assessment, treatment plan,  treatment alternatives and goals were discussed and mutually agreed upon: by patient  Tiane Szydlowski 01/25/2016, 12:13 PM

## 2016-01-25 NOTE — Progress Notes (Signed)
Social Work Assessment and Plan  Patient Details  Name: [name] MRN: [id number] Date of Birth: 10-May-1934  Today's Date: 01/24/2016  Problem List:  Patient Active Problem List   Diagnosis Date Noted  . Acute ischemic right MCA stroke (HCC) 01/23/2016  . Gait disturbance, post-stroke   . Hemiparesis (HCC)   . Essential hypertension   . Dyslipidemia   . Thyroid activity decreased   . E-coli UTI   . Dyspepsia   . Dehydration   . Acute blood loss anemia   . Intracranial vascular stenosis   . Ischemic stroke (HCC) 01/19/2016  . Recurrent UTI 01/19/2016  . HLD (hyperlipidemia) 01/19/2016  . Intertrochanteric fracture of right hip (HCC) 03/23/2014  . HTN (hypertension) 03/23/2014  . Hypothyroidism 03/23/2014  . Anemia 03/23/2014  . Thrombocytopenia, unspecified (HCC) 03/23/2014  . Closed right hip fracture (HCC) 03/23/2014  . Pseudoaneurysm of right femoral artery (HCC) 01/31/2014  . Pseudoaneurysm of femoral artery (HCC) 01/30/2014  . Splenic artery aneurysm (HCC) 12/04/2013   Past Medical History:  Past Medical History  Diagnosis Date  . Hypertension   . High cholesterol   . Stroke [facility]) 1990's    "mild", denies residual on 01/28/2014  . Pneumonia     "maybe in my teens"  . Hypothyroidism   . Anemia   . History of blood transfusion     "S/P knee OR"  . Arthritis     "was in my knees"  . Recurrent UTI (urinary tract infection)    Past Surgical History:  Past Surgical History  Procedure Laterality Date  . Muscle biopsy Left 1990's    "knot biopsy from carrying mail bag for years"  . Bladder suspension  2012  . Splenic artery embolization  01/28/2014  . Joint replacement    . Total knee arthroplasty Bilateral 2012  . Vaginal hysterectomy    . Revison of arteriovenous fistula Right 01/31/2014    Procedure: REPAIR PSEUDOANEURYSM;  Surgeon: Nada Libman, MD;  Location: [facility] OR;  Service: Vascular;  Laterality: Right;  Repair of Femoral Pseudoaneurysm.  .  Femur im nail Right 03/23/2014    Procedure: INTRAMEDULLARY (IM) NAIL FEMORAL;  Surgeon: Shelda Pal, MD;  Location: [facility] ORS;  Service: Orthopedics;  Laterality: Right;  . Visceral angiogram N/A 01/28/2014    Procedure: VISCERAL ANGIOGRAM;  Surgeon: Fransisco Hertz, MD;  Location: [facility] CATH LAB;  Service: Cardiovascular;  Laterality: N/A;  . Embolization N/A 01/28/2014    Procedure: EMBOLIZATION;  Surgeon: Fransisco Hertz, MD;  Location: [facility] CATH LAB;  Service: Cardiovascular;  Laterality: N/A;  Splenic Artery   Social History:  reports that she has never smoked. She has never used smokeless tobacco. She reports that she does not drink alcohol or use illicit drugs.  Family / Support Systems Marital Status: Widow/Widower How Long?: 5 years Patient Roles: Parent, Other (Comment) (sister) Children: Feven Petterson - son - [phone] Other Supports: another son Anticipated Caregiver: son - supervision assist if needed Ability/Limitations of Caregiver: Tresa Endo is on disability.  Can be with pt as needed. Caregiver Availability: 24/7 Family Dynamics: supportive children.    Social History Preferred language: English Religion: Baptist Read: Yes Write: Yes Employment Status: Retired Date Retired/Disabled/Unemployed: 1998 Age Retired: 63 Marine scientist Issues: none reported Guardian/Conservator: N/A - MD has stated that pt is capable of making her own decisions.   Abuse/Neglect Physical Abuse: Denies Verbal Abuse: Denies Sexual Abuse: Denies Exploitation of patient/patient's resources: Denies Self-Neglect: Denies  Emotional Status Pt's  affect, behavior and adjustment status: Pt reports feeling well emotionally and is motivated to get better. Recent Psychosocial Issues: Pt's sister is at [facility] and she is clearly worried about her not being motivated to rehabilitate.  Pt has tried to help her. Psychiatric History: none reported Substance Abuse History: none reported  Patient /  Family Perceptions, Expectations & Goals Pt/Family understanding of illness & functional limitations: Pt reports a good understanding of her condition and limitations. Premorbid pt/family roles/activities: Pt enjoys keeping her house in order and drives regularly and run errands.  Enjoys her sons. Anticipated changes in roles/activities/participation: Pt realizes that she will not be able to drive right away, but hopes to get back to it soon. Pt/family expectations/goals: Pt would like to regain her use of her left arm and leg and wants to enjoy doing her activities again.  She also helps to drive again eventually.  Community [employer] Agencies: None Premorbid Home Care/DME Agencies: Other (Comment) (Pt has been to [facility] x 3 after surgeries.) Transportation available at discharge: family Resource referrals recommended: Support group (specify)  Discharge Planning Living Arrangements: Children (son Tresa Endo lives with her) Support Systems: Children, Other relatives Type of Residence: Private residence Insurance Resources: [employer], Media planner (specify) Counselling psychologist - postal workers) Architect: Restaurant manager, fast food Screen Referred: No Money Management: Patient Does the patient have any problems obtaining your medications?: No Home Management: Pt's son will help with home management. Patient/Family Preliminary Plans: Pt plans to return to her home with her son to be with her. Barriers to Discharge:  (Pt has a ramped entrance.) Social Work Anticipated Follow Up Needs: HH/OP, Support Group Expected length of stay: 7 to 10 days  Clinical Impression CSW met with pt to introduce self and role of CSW, as well as to complete assessment.  Pt was very open with CSW and is motivated to work hard while on [employer].  She has been through rehab at [facility] three different times, so she knows what it takes and she is up for it.  Pt has good support from her sons and one of them,  Tresa Endo, will be with her at home and can assist, as needed.  Pt has a w/c, canes, and handicap ready bathroom at home.  She also has a ramp with rails at home.  She has no concerns about returning home and is motivated to get there. CSW will continue to follow and assist with d/c plan as needed.   Fox Salminen, [name] 01/25/2016, 7:44 PM

## 2016-01-25 NOTE — Progress Notes (Signed)
[facility] Individual Statement of Services  Patient Name:  [name]  Date:  01/25/2016  Welcome to [facility].  Our goal is to provide you with an individualized program based on your diagnosis and situation, designed to meet your specific needs.  With this comprehensive rehabilitation program, you will be expected to participate in at least 3 hours of rehabilitation therapies Monday-Friday, with modified therapy programming on the weekends.  Your rehabilitation program will include the following services:  Physical Therapy (PT), Occupational Therapy (OT), Speech Therapy (ST), 24 hour per day rehabilitation nursing, Case Management (Social Worker), Rehabilitation Medicine, Nutrition Services and Pharmacy Services  Weekly team conferences will be held on Wednesdays to discuss your progress.  Your Social Worker will talk with you frequently to get your input and to update you on team discussions.  Team conferences with you and your family in attendance may also be held.  Expected length of stay: 7 to 10 days  Overall anticipated outcome: Modified Independent with supervision for stairs and community ambulation  Depending on your progress and recovery, your program may change. Your Social Worker will coordinate services and will keep you informed of any changes. Your Social Worker's name and contact numbers are listed  below.  The following services may also be recommended but are not provided by the [facility] will be made to provide these services after discharge if needed.  Arrangements include referral to agencies that provide these services.  Your insurance has been verified to be:  Medicare and [place] Your primary doctor is:  Dr. Nelda Bucks  Pertinent information will be shared with your doctor  and your insurance company.  Social Worker:  Alfonse Alpers, LCSW  [phone] or [phone]  Information discussed with and copy given to patient by: Trey Sailors, 01/25/2016, 7:06 PM

## 2016-01-25 NOTE — Progress Notes (Signed)
Occupational Therapy Session Note  Patient Details  Name: [name] MRN: [id number] Date of Birth: [birth year]  Today's Date: 01/25/2016 OT Individual Time: [id number] OT Individual Time Calculation (min): 60 min    Short Term Goals: Week 1:  OT Short Term Goal 1 (Week 1): Pt will setup meal tray with use of LUE as gross assist with opening containers. OT Short Term Goal 2 (Week 1): Pt with complete grooming tasks in standing with supervision.   Skilled Therapeutic Interventions/Progress Updates:    Treatment session with focus on functional mobility and use of LUE during self-care tasks.  Pt ambulated to room shower with min guard assist and no AD, demonstrating increased awareness of obstacles with slowing down and steadying self when crossing threshold into bathroom.  Completed bathing at sit > stand level with min assist for balance when standing to wash buttocks.  Noted decreased motor control and sustained strength when washing body with LUE, including dropping wash cloth x3.  Pt reports decreased sensation in hand during functional tasks.  Educated on visually attending to Lt hand during use to improve sustained control.  Ambulated to therapy gym with min guard and no LOB.  Noted difficulty with grading pressure on items with attempting 9 hole peg test.  Pt often squeezing too hard and pegs shooting out of hand.  Plan to further address motor control, attention to LUE, and grading of grasp and release.  Discussed difficulty using cell phone, with plan to further address as pertaining to NMR.  Therapy Documentation Precautions:  Precautions Precautions: Fall Restrictions Weight Bearing Restrictions: No General:   Vital Signs: Therapy Vitals Temp: 97.6 F (36.4 C) Temp Source: Oral Pulse Rate: 60 Resp: 18 BP: (!) 149/82 mmHg Patient Position (if appropriate): Lying Oxygen Therapy SpO2: 95 % O2 Device: Not Delivered Pain:   Pt with no c/o pain  See Function Navigator  for Current Functional Status.   Therapy/Group: Individual Therapy  Simonne Come 01/25/2016, 8:08 AM

## 2016-01-26 ENCOUNTER — Inpatient Hospital Stay (HOSPITAL_COMMUNITY): Payer: Medicare Other | Admitting: Physical Therapy

## 2016-01-26 ENCOUNTER — Inpatient Hospital Stay (HOSPITAL_COMMUNITY): Payer: Medicare Other | Admitting: Occupational Therapy

## 2016-01-26 ENCOUNTER — Inpatient Hospital Stay (HOSPITAL_COMMUNITY): Payer: Medicare Other | Admitting: Speech Pathology

## 2016-01-26 ENCOUNTER — Encounter: Payer: Self-pay | Admitting: *Deleted

## 2016-01-26 NOTE — Progress Notes (Signed)
Patient ID: [name], female   DOB: 23-Feb-1934, 82 y.o.   MRN: [id number] Patient did not show up for 01/26/16, 11:30 AM, appointment to have a cardiac event monitor applied. Patient is scheduled for a new patient appointment with Dr. Irish Lack  03/08/16.

## 2016-01-26 NOTE — Progress Notes (Signed)
Occupational Therapy Session Note  Patient Details  Name: [name] MRN: [id number] Date of Birth: 10-Jun-1934  Today's Date: 01/26/2016 OT Individual Time: 1515-1600 OT Individual Time Calculation (min): 45 min    Short Term Goals: Week 1:  OT Short Term Goal 1 (Week 1): Pt will setup meal tray with use of LUE as gross assist with opening containers. OT Short Term Goal 2 (Week 1): Pt with complete grooming tasks in standing with supervision.   Skilled Therapeutic Interventions/Progress Updates:  Upon entering the room, pt supine in bed awaiting therapist with no c/o pain this session. Pt preformed supine >sit with supervision and min verbal cues for technique. Pt ambulated 100' towards ADL apartment without use of AD and required steady assist. Pt seated on low, plush couch with min verbal cues for technique but able to stand with close supervision. Pt engaged in bed making task for functional balance and bilateral coordination. Pt needing steady assist for balance but utilizing both hands to compete task. Pt taking seated rest break and therapist educating on secondary stroke risk education. Pt verbalized understanding. Pt able to navigate self back to room with close supervision and LOB when turning head with ambulation requiring min A to steady. Pt returned to bed with call bell and all needed items within reach upon exiting the room.   Therapy Documentation Precautions:  Precautions Precautions: Fall Restrictions Weight Bearing Restrictions: No  See Function Navigator for Current Functional Status.   Therapy/Group: Individual Therapy  Phineas Semen 01/26/2016, 4:08 PM

## 2016-01-26 NOTE — Progress Notes (Signed)
82 y.o. RH-female with history of HTN, OA, recurrent UTIs, CVA with left sided weakness with numbness and slurred speech at [facility] a week PTA with persistent left sided symptoms. She sustained a fall and struck her head on the toilet and was admitted for work up. She had been off plavix for a week for unclear reasons--question discontinued at discharged v/s stopped taking it. MRI brain showed mild local propagation of RIGHT MCA territory acute small infarcts and positive for moderate to severe stenosis bilateral PCA P2 segments and L-VA distal segment and posterior R-MCA M2 occlusion 9m beyond its orign. Carotid dopplers without significant stenosis. 2 D echo with EF 60-65% with mild concentric LVH. Dr. XErlinda Hongrecommended ASA/plavix for 3 months followed by plavix. [facility] to rule out A fib due to M2 occlusion and avoid hypotension with SBP 130-150 range to allow for perfusion  Subjective/Complaints: Discussed care team results with pt, who is ok with D/C date, sone at home recovering from back surgery Discussed ASA "allergy " with RN, blood in urine listed but this is not allergy and can be monitored  ROS- Negative CP, SOB , NVD, Denies any problems with bowel or bladder  Objective: Vital Signs: Blood pressure 135/71, pulse 64, temperature 97.9 F (36.6 C), temperature source Oral, resp. rate 18, height 5' 7"  (1.702 m), weight 91.899 kg (202 lb 9.6 oz), SpO2 94 %. No results found. Results for orders placed or performed during the hospital encounter of 01/23/16 (from the past 72 hour(s))  [facility]inalysis, [facility]     Status: None   Collection Time: 01/24/16  1:18 AM  Result Value Ref Range   Color, Urine YELLOW YELLOW   APPearance CLEAR CLEAR   Specific Gravity, Urine 1.011 1.005 - 1.030   pH 6.0 5.0 - 8.0   Glucose, UA NEGATIVE NEGATIVE mg/dL   Hgb urine dipstick NEGATIVE NEGATIVE   Bilirubin Urine  NEGATIVE NEGATIVE   Ketones, ur NEGATIVE NEGATIVE mg/dL   Protein, ur NEGATIVE NEGATIVE mg/dL   Nitrite NEGATIVE NEGATIVE   Leukocytes, UA NEGATIVE NEGATIVE    Comment: MICROSCOPIC NOT DONE ON URINES WITH NEGATIVE PROTEIN, BLOOD, LEUKOCYTES, NITRITE, OR GLUCOSE <1000 mg/dL.  Urine culture     Status: Abnormal   Collection Time: 01/24/16  1:18 AM  Result Value Ref Range   Specimen Description URINE, CLEAN CATCH    Special Requests NONE    Culture MULTIPLE SPECIES PRESENT, SUGGEST RECOLLECTION (A)    Report Status 01/25/2016 FINAL   CBC WITH DIFFERENTIAL     Status: Abnormal   Collection Time: 01/24/16  7:32 AM  Result Value Ref Range   WBC 5.9 4.0 - 10.5 K/uL   RBC 3.84 (L) 3.87 - 5.11 MIL/uL   Hemoglobin 11.9 (L) 12.0 - 15.0 g/dL   HCT 35.1 (L) 36.0 - 46.0 %   MCV 91.4 78.0 - 100.0 fL   MCH 31.0 26.0 - 34.0 pg   MCHC 33.9 30.0 - 36.0 g/dL   RDW 13.2 11.5 - 15.5 %   Platelets 150 150 - 400 K/uL   Neutrophils Relative % 54 %   Neutro Abs 3.2 1.7 - 7.7 K/uL   Lymphocytes Relative 30 %   Lymphs Abs 1.8 0.7 - 4.0 K/uL   Monocytes Relative 11 %   Monocytes Absolute 0.7 0.1 - 1.0 K/uL   Eosinophils Relative 4 %   Eosinophils Absolute 0.2 0.0 -  0.7 K/uL   Basophils Relative 1 %   Basophils Absolute 0.1 0.0 - 0.1 K/uL  Comprehensive metabolic panel     Status: Abnormal   Collection Time: 01/24/16  7:32 AM  Result Value Ref Range   Sodium 137 135 - 145 mmol/L   Potassium 4.2 3.5 - 5.1 mmol/L   Chloride 102 101 - 111 mmol/L   CO2 22 22 - 32 mmol/L   Glucose, Bld 93 65 - 99 mg/dL   BUN 15 6 - 20 mg/dL   Creatinine, Ser 0.86 0.44 - 1.00 mg/dL   Calcium 10.1 8.9 - 10.3 mg/dL   Total Protein 6.1 (L) 6.5 - 8.1 g/dL   Albumin 3.8 3.5 - 5.0 g/dL   AST 66 (H) 15 - 41 U/L   ALT 64 (H) 14 - 54 U/L   Alkaline Phosphatase 63 38 - 126 U/L   Total Bilirubin 1.2 0.3 - 1.2 mg/dL   GFR calc non Af Amer >60 >60 mL/min   GFR calc Af Amer >60 >60 mL/min    Comment: (NOTE) The eGFR has been  calculated using the CKD EPI equation. This calculation has not been validated in all clinical situations. eGFR's persistently <60 mL/min signify possible Chronic Kidney Disease.    Anion gap 13 5 - 15     HEENT: normal and left facial droop Cardio: RRR and occ skipped beats Resp: CTA B/L and unlabored GI: BS positive and NT, ND Extremity:  No Edema Skin:   Intact Neuro: Alert/Oriented, Flat, Normal Sensory, Abnormal Motor 3- in Left delt, bi, tri, grip,4- HF, KE ADF, 5/5 on RIght side, Abnormal FMC Ataxic/ dec FMC and Dysarthric Musc/Skel:  Other no pain with UE or LE ROM Gen NAD   Assessment/Plan: 1. Functional deficits secondary to Right MCA infarct which require 3+ hours per day of interdisciplinary therapy in a comprehensive inpatient rehab setting. Physiatrist is providing close team supervision and 24 hour management of active medical problems listed below. Physiatrist and rehab team continue to assess barriers to discharge/monitor patient progress toward functional and medical goals. FIM: Function - Bathing Position: Shower Body parts bathed by patient: Right arm, Left arm, Chest, Abdomen, Front perineal area, Right upper leg, Buttocks, Left upper leg, Right lower leg, Left lower leg, Back Assist Level: Touching or steadying assistance(Pt > 75%)  Function- Upper Body Dressing/Undressing What is the patient wearing?: Bra, Pull over shirt/dress Bra - Perfomed by patient: Thread/unthread right bra strap, Thread/unthread left bra strap Bra - Perfomed by helper: Hook/unhook bra (pull down sports bra) Pull over shirt/dress - Perfomed by patient: Thread/unthread right sleeve, Thread/unthread left sleeve, Put head through opening, Pull shirt over trunk Assist Level: Touching or steadying assistance(Pt > 75%) Set up : To obtain clothing/put away Function - Lower Body Dressing/Undressing What is the patient wearing?: Underwear, Pants, Non-skid slipper socks Position: Sitting  EOB Underwear - Performed by patient: Thread/unthread right underwear leg, Thread/unthread left underwear leg, Pull underwear up/down Pants- Performed by patient: Thread/unthread right pants leg, Thread/unthread left pants leg, Pull pants up/down Pants- Performed by helper: Pull pants up/down Non-skid slipper socks- Performed by patient: Don/doff right sock, Don/doff left sock Assist for footwear: Setup Assist for lower body dressing: Set up Set up : To obtain clothing/put away  Function - Toileting Toileting steps completed by patient: Adjust clothing prior to toileting, Performs perineal hygiene, Adjust clothing after toileting Toileting steps completed by helper: Adjust clothing prior to toileting, Performs perineal hygiene, Adjust clothing after toileting [place]  Devices: Grab bar or rail Assist level: Supervision or verbal cues  Function - Air cabin crew transfer assistive device: Elevated toilet seat/BSC over toilet, Grab bar Assist level to toilet: Touching or steadying assistance (Pt > 75%) Assist level from toilet: Touching or steadying assistance (Pt > 75%) Assist level to bedside commode (at bedside): Supervision or verbal cues Assist level from bedside commode (at bedside): Supervision or verbal cues  Function - Chair/bed transfer Chair/bed transfer method: Ambulatory Chair/bed transfer assist level: Touching or steadying assistance (Pt > 75%) Chair/bed transfer assistive device: Armrests Chair/bed transfer details: Tactile cues for posture, Verbal cues for precautions/safety  Function - Locomotion: Wheelchair Will patient use wheelchair at discharge?: No Function - Locomotion: Ambulation Assistive device: No device Max distance: 200 Assist level: Touching or steadying assistance (Pt > 75%) Assist level: Touching or steadying assistance (Pt > 75%) Assist level: Touching or steadying assistance (Pt > 75%) Assist level: Touching or steadying  assistance (Pt > 75%) Assist level: Touching or steadying assistance (Pt > 75%)  Function - Comprehension Comprehension: Auditory Comprehension assist level: Follows complex conversation/direction with extra time/assistive device  Function - Expression Expression: Verbal Expression assist level: Expresses basic 90% of the time/requires cueing < 10% of the time.  Function - Social Interaction Social Interaction assist level: Interacts appropriately with others - No medications needed.  Function - Problem Solving Problem solving assist level: Solves complex problems: With extra time  Function - Memory Memory assist level: More than reasonable amount of time Patient normally able to recall (first 3 days only): Current season, Location of own room, Staff names and faces, That he or she is in a hospital   Medical Problem List and Plan: 1.  Hemiparesis, post stroke gait disorder secondary to propagation of right MCA infarct.planned d/c 5/24 2.  DVT Prophylaxis/Anticoagulation: Pharmaceutical: Lovenox 3. Pain Management: Tylenol prn 4. Mood: LCSW to follow for evaluation and support.   5. Neuropsych: This patient is capable of making decisions on her own behalf. 6. Skin/Wound Care: routine pressure relief measures.   7. Fluids/Electrolytes/Nutrition: Monitor I/O. Check lytes in am.   8. HTN: On labetalol 300 mg bid and lisinopril at bedtime. Hold Norvasc. Monitor BP tid--avoid hypotension with SBP 130-150 range Filed Vitals:   01/25/16 2119 01/26/16 0616  BP: 138/80 135/71  Pulse:  64  Temp:  97.9 F (36.6 C)  Resp:     9. Dyslipidemia: On Zetia and Pravachol.   10. Hypothyroid: continue supplement 11. E coli UTI: Rocephin discontinued  -recheck UA negative, also no evidence of hematuria   12. Dyspepsia: Add Pepcid.   13. Dehydration: now eating and drinking well 14. ABLA: hgb now essentially normal  LOS (Days) 3 A FACE TO FACE EVALUATION WAS PERFORMED  [name]  E 01/26/2016, 8:07 AM

## 2016-01-26 NOTE — Plan of Care (Signed)
Problem: RH BOWEL ELIMINATION Goal: RH STG MANAGE BOWEL WITH ASSISTANCE STG Manage Bowel with mod I Assistance.  Outcome: Progressing No incontinent episode reported

## 2016-01-26 NOTE — Progress Notes (Signed)
Physical Therapy Session Note  Patient Details  Name: [name] MRN: [id number] Date of Birth: 08-03-1934  Today's Date: 01/26/2016 PT Individual Time: 0815-0900 PT Individual Time Calculation (min): 45 min   Short Term Goals: Week 1:  PT Short Term Goal 1 (Week 1): =LTG due to estimated LOS  Skilled Therapeutic Interventions/Progress Updates:    Pt received seated on EOB eating breakfast; denies pain and agreeable to treatment. Missed 15 min due to late breakfast tray and pt request to complete eating before therapy. Performed dressing seated on EOB with distant S and setup to retrieve items. Min cues to problem solve donning bra after hooking clasp and threading overhead due to difficulty with clasp once bra already around trunk. S for sit <>stand and standing at sink to brush teeth. Gait to/from gym with min guard/S, pt self-correcting postural impairments. Seated/supine LE strengthening exercises 2x10 each including long arc quad, hip flexion marching, hip adduction isometric, heel/toe raises, bridging, SLR, crunches, sidelying clamshell abduction. Returned to room as above; remained seated in recliner with all needs in reach.  Therapy Documentation Precautions:  Precautions Precautions: Fall Restrictions Weight Bearing Restrictions: No General: PT Amount of Missed Time (min): 15 Minutes PT Missed Treatment Reason: Other (Comment) (late breakfast tray)  See Function Navigator for Current Functional Status.   Therapy/Group: Individual Therapy  [name] 01/26/2016, 9:00 AM

## 2016-01-26 NOTE — Plan of Care (Signed)
Problem: RH PAIN MANAGEMENT Goal: RH STG PAIN MANAGED AT OR BELOW PT'S PAIN GOAL Less than 3 our of 10  Outcome: Progressing No c/o pain

## 2016-01-26 NOTE — IPOC Note (Signed)
Overall Plan of Care [facility]) Patient Details Name: [name] MRN: [id number] DOB: 1934-02-22  Admitting Diagnosis: CVA  Hospital Problems: Active Problems:   Acute ischemic right MCA stroke (HCC)   Gait disturbance, post-stroke   Hemiparesis (HCC)   Essential hypertension   Dyslipidemia   Thyroid activity decreased   E-coli UTI   Dyspepsia   Dehydration   Acute blood loss anemia     Functional Problem List: Nursing Endurance, Medication Management, Pain, Safety, Skin Integrity  PT Balance, Endurance, Motor, Safety, Sensory  OT Balance, Motor, Safety  SLP Linguistic  TR Endurance, Motor, Safety       Basic ADL's: OT Eating, Grooming, Bathing, Dressing, Toileting     Advanced  ADL's: OT Simple Meal Preparation     Transfers: PT Bed Mobility, Bed to Chair, Car, Furniture, Floor  OT Toilet, Research scientist (life sciences): PT Ambulation, Stairs     Additional Impairments: OT Fuctional Use of Upper Extremity  SLP Communication expression    TR      Anticipated Outcomes Item Anticipated Outcome  Self Feeding Mod I  Swallowing      Basic self-care  Mod I  Toileting  Mod I   Bathroom Transfers Mod I  Bowel/Bladder  patient will be continent of bowel and bladder  Transfers  mod I  Locomotion  mod I in home, S stairs and community ambulation  Communication  Independent   Cognition     Pain  pain less than or equal to 4/10  Safety/Judgment  paptient free from falls/injurry and displaying sound safety judgement   Therapy Plan: PT Intensity: Minimum of 1-2 x/day ,45 to 90 minutes PT Frequency: 5 out of 7 days PT Duration Estimated Length of Stay: 7-10 days OT Intensity: Minimum of 1-2 x/day, 45 to 90 minutes OT Frequency: 5 out of 7 days OT Duration/Estimated Length of Stay: 7-10 days SLP Intensity: Minumum of 1-2 x/day, 30 to 90 minutes SLP Frequency: 3 to 5 out of 7 days SLP Duration/Estimated Length of Stay: 7-10 days        Team  Interventions: Nursing Interventions Patient/Family Education, Disease Management/Prevention, Pain Management, Medication Management, Skin Care/Wound Management, Discharge Planning  PT interventions Ambulation/gait training, Warden/ranger, Community reintegration, Discharge planning, Disease management/prevention, Equities trader education, Neuromuscular re-education, Functional mobility training, Stair training, Therapeutic Activities, Therapeutic Exercise, UE/LE Strength taining/ROM, UE/LE Coordination activities  OT Interventions Warden/ranger, Community reintegration, Discharge planning, Disease mangement/prevention, Fish farm manager, Functional mobility training, Neuromuscular re-education, Pain management, Patient/family education, Psychosocial support, Self Care/advanced ADL retraining, Therapeutic Activities, Therapeutic Exercise, UE/LE Strength taining/ROM, UE/LE Coordination activities  SLP Interventions Cueing hierarchy, Internal/external aids, Patient/family education  TR Interventions Adaptive equipment instruction, 1:1 session, Warden/ranger, Functional mobility training, Firefighter, Equities trader education, Therapeutic activities, Recreation/leisure participation, Therapeutic exercise, UE/LE Coordination activities  SW/CM Interventions Discharge Planning, Facilities manager, Patient/Family Education    Team Discharge Planning: Destination: PT-Home ,OT- Home , SLP-Home Projected Follow-up: PT-Home health PT, OT-  Outpatient OT, SLP-Outpatient SLP Projected Equipment Needs: PT-None recommended by PT, OT- None recommended by OT (Pt has all necessary equipment), SLP-None recommended by SLP Equipment Details: PT- , OT-  Patient/family involved in discharge planning: PT- Patient,  OT-Patient, SLP-Patient  MD ELOS: 7-9d Medical Rehab Prognosis:  Excellent Assessment: 82 y.o. RH-female with history of HTN, OA,  recurrent UTIs, CVA with left sided weakness with numbness and slurred speech at [facility] a week PTA with persistent left sided symptoms. She sustained a fall and struck her  head on the toilet and was admitted for work up. She had been off plavix for a week for unclear reasons--question discontinued at discharged v/s stopped taking it. MRI brain showed mild local propagation of RIGHT MCA territory acute small infarcts and positive for moderate to severe stenosis bilateral PCA P2 segments and L-VA distal segment and posterior R-MCA M2 occlusion 8mm beyond its orign. Carotid dopplers without significant stenosis. 2 D echo with EF 60-65% with mild concentric LVH. Dr. Roda Shutters recommended ASA/plavix for 3 months followed by plavix. She is to follow up with [facility] care for 30 day event monitor to rule out A fib due to M2 occlusion and avoid hypotension with SBP 130-150 range to allow for perfusion   Now requiring 24/7 Rehab RN,MD, as well as CIR level PT, OT and SLP.  Treatment team will focus on ADLs and mobility with goals set at Mod I  See Team Conference Notes for weekly updates to the plan of care

## 2016-01-26 NOTE — Progress Notes (Signed)
Social Work Patient ID: [name], female   DOB: 19-Apr-1934, 82 y.o.   MRN: [id number]   Nigel Sloop, LCSW Social Worker Signed  Patient Care Conference 01/25/2016  1:00 PM    Expand All Collapse All   Inpatient RehabilitationTeam Conference and Plan of Care Update Date: 01/25/2016   Time: 11:25 AM     Patient Name: [name]       Medical Record Number: [id number]  Date of Birth: 1934-07-18 Sex: Female         Room/Bed: 4W20C/4W20C-01 Payor Info: Payor: MEDICARE / Plan: MEDICARE PART A AND B / Product Type: *No Product type* /    Admitting Diagnosis: CVA  Admit Date/Time:  01/23/2016  4:29 PM Admission Comments: No comment available   Primary Diagnosis:  <principal problem not specified> Principal Problem: <principal problem not specified>    Patient Active Problem List     Diagnosis  Date Noted   .  Acute ischemic right MCA stroke (HCC)  01/23/2016   .  Gait disturbance, post-stroke     .  Hemiparesis (HCC)     .  Essential hypertension     .  Dyslipidemia     .  Thyroid activity decreased     .  E-coli UTI     .  Dyspepsia     .  Dehydration     .  Acute blood loss anemia     .  Intracranial vascular stenosis     .  Ischemic stroke (HCC)  01/19/2016   .  Recurrent UTI  01/19/2016   .  HLD (hyperlipidemia)  01/19/2016   .  Intertrochanteric fracture of right hip (HCC)  03/23/2014   .  HTN (hypertension)  03/23/2014   .  Hypothyroidism  03/23/2014   .  Anemia  03/23/2014   .  Thrombocytopenia, unspecified (HCC)  03/23/2014   .  Closed right hip fracture (HCC)  03/23/2014   .  Pseudoaneurysm of right femoral artery (HCC)  01/31/2014   .  Pseudoaneurysm of femoral artery (HCC)  01/30/2014   .  Splenic artery aneurysm (HCC)  12/04/2013     Expected Discharge Date: Expected Discharge Date: 02/01/16  Team Members Present: Physician leading conference: Dr. Claudette Laws Social Worker Present: Dossie Der, LCSW Nurse Present: Chana Bode,  RN PT Present: Alyson Reedy, Nita Sickle, PT OT Present: Rosalio Loud, OT SLP Present: Jackalyn Lombard, SLP PPS Coordinator present : Tora Duck, RN, CRRN        Current Status/Progress  Goal  Weekly Team Focus   Medical     Patient ambulating with assistance. Facial droop with drooling.   Home discharge with family support   Discharge planning   Bowel/Bladder     Continent of bowel and bladder  Remain continent, prevent constipation   Monitor and toilet, as appropriate    Swallow/Nutrition/ Hydration               ADL's     Min assist overall  Mod I overall  LUE NMR, dynamic standing balance, home making tasks    Mobility     min guard transfers, gait, stairs, minA higher level balance   mod I in home, S stairs and community gait   dynamic standing balance, gait training, endurance    Communication     mild dysarthria   independent  education and carryover of intelligibility strategies    Safety/Cognition/ Behavioral Observations  Pain     No complaints of pain at present time   Adequate pain relief, per patient perception  Continue to monitor and provide interventions for expressed pain as indicated   Skin     No alteration in skin integrity noted   Maintain skin integrity, prevent breakdown   Monitor and assess skin qshift/prn, implement interventions as needed    Rehab Goals Patient on target to meet rehab goals: Yes Rehab Goals Revised: none - pt's first conference *See Care Plan and progress notes for long and short-term goals.    Barriers to Discharge:  Still has balance issues     Possible Resolutions to Barriers:   Training with assistive device, improve safety awareness      Discharge Planning/Teaching Needs:   Pt plans to return to his home with her son there for assistance, as needed.  Pt's son can come for family education closer to d/c.    Team Discussion:    Goals mod/i level-supervision stairs and community gait. Drooling and  concerned about this-SP working on. Distractible but attention is improving. Medically doing well and adjusting medications.    Revisions to Treatment Plan:    None    Continued Need for Acute Rehabilitation Level of Care: The patient requires daily medical management by a physician with specialized training in physical medicine and rehabilitation for the following conditions: Daily direction of a multidisciplinary physical rehabilitation program to ensure safe treatment while eliciting the highest outcome that is of practical value to the patient.: Yes Daily medical management of patient stability for increased activity during participation in an intensive rehabilitation regime.: Yes Daily analysis of laboratory values and/or radiology reports with any subsequent need for medication adjustment of medical intervention for : Neurological problems  Penelopi Mikrut, [name] 01/26/2016, 11:37 AM        Revision History      Date/Time User Provider Type Action    01/26/2016 11:37 AM Nigel Sloop, LCSW Social Worker Sign    01/26/2016  9:16 AM Lucy Chris, LCSW Social Worker [employer] Details Report

## 2016-01-26 NOTE — Progress Notes (Signed)
Speech Language Pathology Daily Session Note  Patient Details  Name: [name] MRN: [id number] Date of Birth: 11/13/1933  Today's Date: 01/26/2016 SLP Individual Time: 1045-1130 SLP Individual Time Calculation (min): 45 min  Short Term Goals: Week 1: SLP Short Term Goal 1 (Week 1): STG=LTG due to ELOS   Skilled Therapeutic Interventions: Skilled treatment session focused on communication goals. SLP facilitated session by providing supervision cues for use of intelligibility strategies. Pt was intelligible at the simple conversation level and during barrier picture description tasks with supervision cues. Pt stated that she was working hard slowing her rate. Rate was appropriate for intelligibility this session. Pt was left in bed with all needs within reach. Continue current plan of care.   Function:  Cognition Comprehension Comprehension assist level: Follows complex conversation/direction with extra time/assistive device  Expression   Expression assist level: Expresses complex 90% of the time/cues < 10% of the time  Social Interaction    Problem Solving Problem solving assist level: Solves complex problems: With extra time  Memory Memory assist level: More than reasonable amount of time    Pain Pain Assessment Pain Assessment: No/denies pain  Therapy/Group: Individual Therapy  [name] 01/26/2016, 12:31 PM

## 2016-01-26 NOTE — Progress Notes (Addendum)
Occupational Therapy Session Note  Patient Details  Name: [name] MRN: [id number] Date of Birth: Jul 31, 1934  Today's Date: 01/26/2016 OT Individual Time: [id number] and [id number] OT Individual Time Calculation (min): 45 min and 30 min   Short Term Goals: Week 1:  OT Short Term Goal 1 (Week 1): Pt will setup meal tray with use of LUE as gross assist with opening containers. OT Short Term Goal 2 (Week 1): Pt with complete grooming tasks in standing with supervision.   Skilled Therapeutic Interventions/Progress Updates:    1) Treatment session with focus on LUE NMR and dynamic standing balance.  Pt already dressed prior to session.  Ambulated to therapy gym without AD and contact guard for balance.  Provided theraputty and handout to address impaired strength and motor control in LUE.  Engaged in demonstration and instruction of each exercise with pt requiring increased time with 50% of tasks and increased instruction.  At end of activity, pt reports feeling somewhat light headed/dizzy due to concentration to use non-dominant LUE in such a dominant manner.  Discussed purpose of activity and carryover to functional bimanual tasks. Engaged in horse shoe toss activity with focus on dynamic standing balance while reaching outside BOS with LUE, min cues to visually attend to LUE to increase motor control and success with reaching. Incorporated tossing horse shoes and bending to retrieve with min guard during bending.  Returned to room and pt requested to lay down in bed due to somewhat uneasy feeling from tasks.    2) Treatment session with focus on education regarding d/c planning and adaptation of pt's cell phone.  Pt had reported to therapist that she had difficulty holding cell phone due to decreased grasp and sensation in LUE.  Applied textured tape to back of phone to improve grasp.  Discussed features on phone to increase access to most used applications and use of voice commands.  Issued pt  [facility] HEP with focus on finger movements and grasping patterns incorporating picking up small items.  Encouraged pt to incorporate tasks into down time to continue to address LUE deficits.  Therapy Documentation Precautions:  Precautions Precautions: Fall Restrictions Weight Bearing Restrictions: No Vital Signs: Therapy Vitals Pulse Rate: 71 BP: 135/77 mmHg Pain: Pain Assessment Pain Assessment: No/denies pain  See Function Navigator for Current Functional Status.   Therapy/Group: Individual Therapy  Simonne Come 01/26/2016, 12:14 PM

## 2016-01-27 ENCOUNTER — Inpatient Hospital Stay (HOSPITAL_COMMUNITY): Payer: Medicare Other | Admitting: Occupational Therapy

## 2016-01-27 ENCOUNTER — Inpatient Hospital Stay (HOSPITAL_COMMUNITY): Payer: Medicare Other | Admitting: Speech Pathology

## 2016-01-27 ENCOUNTER — Inpatient Hospital Stay (HOSPITAL_COMMUNITY): Payer: Medicare Other | Admitting: Physical Therapy

## 2016-01-27 NOTE — Plan of Care (Signed)
Problem: RH PAIN MANAGEMENT Goal: RH STG PAIN MANAGED AT OR BELOW PT'S PAIN GOAL Less than 3 our of 10  Outcome: Progressing No c/o p

## 2016-01-27 NOTE — Progress Notes (Signed)
Speech Language Pathology Daily Session Note  Patient Details  Name: TAYLYNN EASTIN MRN: [id number] Date of Birth: 03/13/1934  Today's Date: 01/27/2016 SLP Individual Time: 0900-1000 SLP Individual Time Calculation (min): 60 min  Short Term Goals: Week 1: SLP Short Term Goal 1 (Week 1): STG=LTG due to ELOS   Skilled Therapeutic Interventions: Skilled treatment session focused on communication goals. SLP facilitated session by providing mod I for recall of compensatory dysarthria strategies. Pt intelligible at the simple to mildly complex conversation level in a moderately distracting noisy environment with Mod I. Pt able to intelligibly describe barrier tasks with good description and complete intelligibility.  Pt able to self-monitor with Mod I. Great progress made. Pt returned to room, left in recliner with all needs within reach. Continue current plan of care.   Function:   Cognition Comprehension Comprehension assist level: Understands complex 90% of the time/cues 10% of the time  Expression   Expression assist level: Expresses complex 90% of the time/cues < 10% of the time  Social Interaction Social Interaction assist level: Interacts appropriately with others - No medications needed.  Problem Solving Problem solving assist level: Solves complex 90% of the time/cues < 10% of the time  Memory Memory assist level: Recognizes or recalls 90% of the time/requires cueing < 10% of the time    Pain    Therapy/Group: Individual Therapy  Shateka Petrea 01/27/2016, 12:08 PM

## 2016-01-27 NOTE — Progress Notes (Signed)
Physical Therapy Session Note  Patient Details  Name: [name] MRN: [id number] Date of Birth: 31-Jan-1934  Today's Date: 01/27/2016 PT Individual Time: [id number] PT Individual Time Calculation (min): 60 min   Short Term Goals: Week 1:  PT Short Term Goal 1 (Week 1): =LTG due to estimated LOS  Skilled Therapeutic Interventions/Progress Updates:    Pt received supine in bed, denies pain and agreeable to treatment. Supine>sit with S. Gait into/out of bathroom with S; performed all hygiene and clothing management with distant S. Gait to gym with S and cues for upright posture. Nustep x10 min with BUE/BLE on level 5 for strengthening and endurance. Seated/standing [place] exercises for strength and balance; pt educated on exercises and safety at home with performance only when someone is home with her, and UE support on sink/counter. Stairs 2x12 on 6" stairs with S and B handrails. Supine PROM to BLE hamstrings and hip internal rotators. Educated pt on importance of maintaining activity level upon d/c, discussed options for community exercise classes. Gait to return to room with S. Remained supine in bed at completion of session, all needs in reach.   Therapy Documentation Precautions:  Precautions Precautions: Fall Restrictions Weight Bearing Restrictions: No   See Function Navigator for Current Functional Status.   Therapy/Group: Individual Therapy  [name] 01/27/2016, 11:16 AM

## 2016-01-27 NOTE — Progress Notes (Signed)
Subjective/Complaints: Good day in therapy  ROS- Negative CP, SOB , NVD, Denies any problems with bowel or bladder  Objective: Vital Signs: Blood pressure 154/83, pulse 64, temperature 98.3 F (36.8 C), temperature source Oral, resp. rate 16, height 5\' 7"  (1.702 m), weight 91.899 kg (202 lb 9.6 oz), SpO2 96 %. No results found. No results found for this or any previous visit (from the past 72 hour(s)).   HEENT: normal and left facial droop Cardio: RRR and occ skipped beats Resp: CTA B/L and unlabored GI: BS positive and NT, ND Extremity:  No Edema Skin:   Intact Neuro: Alert/Oriented, Flat, Normal Sensory, Abnormal Motor 3- in Left delt, bi, tri, grip,4- HF, KE ADF, 5/5 on RIght side, Abnormal FMC Ataxic/ dec FMC and Dysarthric Musc/Skel:  Other no pain with UE or LE ROM Gen NAD   Assessment/Plan: 1. Functional deficits secondary to Right MCA infarct which require 3+ hours per day of interdisciplinary therapy in a comprehensive inpatient rehab setting. Physiatrist is providing close team supervision and 24 hour management of active medical problems listed below. Physiatrist and rehab team continue to assess barriers to discharge/monitor patient progress toward functional and medical goals. FIM: Function - Bathing Position: Shower Body parts bathed by patient: Right arm, Left arm, Chest, Abdomen, Front perineal area, Right upper leg, Buttocks, Left upper leg, Right lower leg, Left lower leg, Back Assist Level: Touching or steadying assistance(Pt > 75%)  Function- Upper Body Dressing/Undressing What is the patient wearing?: Bra, Pull over shirt/dress Bra - Perfomed by patient: Thread/unthread right bra strap, Thread/unthread left bra strap, Hook/unhook bra (pull down sports bra) Bra - Perfomed by helper: Hook/unhook bra (pull down sports bra) Pull over shirt/dress - Perfomed by patient: Thread/unthread right sleeve, Thread/unthread left sleeve, Put head through opening, Pull  shirt over trunk Assist Level: Touching or steadying assistance(Pt > 75%) Set up : To obtain clothing/put away Function - Lower Body Dressing/Undressing What is the patient wearing?: Pants Position: Sitting EOB Underwear - Performed by patient: Thread/unthread right underwear leg, Thread/unthread left underwear leg, Pull underwear up/down Pants- Performed by patient: Thread/unthread right pants leg, Thread/unthread left pants leg, Pull pants up/down Pants- Performed by helper: Pull pants up/down Non-skid slipper socks- Performed by patient: Don/doff right sock, Don/doff left sock Assist for footwear: Setup Assist for lower body dressing: Set up Set up : To obtain clothing/put away  Function - Toileting Toileting steps completed by patient: Adjust clothing prior to toileting, Performs perineal hygiene, Adjust clothing after toileting Toileting steps completed by helper: Adjust clothing prior to toileting, Performs perineal hygiene, Adjust clothing after toileting Toileting Assistive Devices: Grab bar or rail Assist level: No help/no cues  Function - Air cabin crew transfer assistive device: Walker Assist level to toilet: Touching or steadying assistance (Pt > 75%) Assist level from toilet: Touching or steadying assistance (Pt > 75%) Assist level to bedside commode (at bedside): Supervision or verbal cues Assist level from bedside commode (at bedside): Supervision or verbal cues  Function - Chair/bed transfer Chair/bed transfer method: Ambulatory Chair/bed transfer assist level: Touching or steadying assistance (Pt > 75%) Chair/bed transfer assistive device: Armrests Chair/bed transfer details: Tactile cues for posture, Verbal cues for precautions/safety  Function - Locomotion: Wheelchair Will patient use wheelchair at discharge?: No Function - Locomotion: Ambulation Assistive device: No device Max distance: 100 Assist level: Touching or steadying assistance (Pt >  75%) Assist level: Touching or steadying assistance (Pt > 75%) Assist level: Touching or steadying assistance (Pt > 75%)  Assist level: Touching or steadying assistance (Pt > 75%) Assist level: Touching or steadying assistance (Pt > 75%)  Function - Comprehension Comprehension: Auditory Comprehension assist level: Follows complex conversation/direction with extra time/assistive device  Function - Expression Expression: Verbal Expression assist level: Expresses complex 90% of the time/cues < 10% of the time  Function - Social Interaction Social Interaction assist level: Interacts appropriately with others - No medications needed.  Function - Problem Solving Problem solving assist level: Solves complex problems: With extra time  Function - Memory Memory assist level: More than reasonable amount of time Patient normally able to recall (first 3 days only): Current season, Location of own room, Staff names and faces, That he or she is in a hospital   Medical Problem List and Plan: 1.  Hemiparesis, post stroke gait disorder secondary to propagation of right MCA infarct.planned d/c 5/24, cont PT, OT , CIR level 2.  DVT Prophylaxis/Anticoagulation: Pharmaceutical: Lovenox 3. Pain Management: Tylenol prn 4. Mood: LCSW to follow for evaluation and support.   5. Neuropsych: This patient is capable of making decisions on her own behalf. 6. Skin/Wound Care: routine pressure relief measures.   7. Fluids/Electrolytes/Nutrition: Monitor I/O. Check lytes in am.   8. HTN: On labetalol 300 mg bid and lisinopril at bedtime. Hold Norvasc. Monitor BP tid--avoid hypotension with SBP 130-150 range Filed Vitals:   01/26/16 1617 01/27/16 0613  BP: 134/79 154/83  Pulse: 63 64  Temp: 98.4 F (36.9 C) 98.3 F (36.8 C)  Resp: 17 16   9. Dyslipidemia: On Zetia and Pravachol.   10. Hypothyroid: continue supplement 11. E coli UTI: resolved  12. Dyspepsia: Add Pepcid.   13. Dehydration: now eating and  drinking well   LOS (Days) 4 A FACE TO FACE EVALUATION WAS PERFORMED  KIRSTEINS,ANDREW E 01/27/2016, 7:33 AM

## 2016-01-27 NOTE — Progress Notes (Signed)
Occupational Therapy Session Note  Patient Details  Name: [name] MRN: [id number] Date of Birth: Aug 13, 1934  Today's Date: 01/27/2016 OT Individual Time: 0815-0900 and 1400-1430 OT Individual Time Calculation (min): 45 min and 30 min   Short Term Goals: Week 1:  OT Short Term Goal 1 (Week 1): Pt will setup meal tray with use of LUE as gross assist with opening containers. OT Short Term Goal 2 (Week 1): Pt with complete grooming tasks in standing with supervision.   Skilled Therapeutic Interventions/Progress Updates:    1) Treatment session with focus on functional transfers and functional use of LUE during self-care tasks.  Pt ambulated to bathroom with close supervision, bracing on wall when crossing elevated threshold into bathroom.  Pt completed toileting task with supervision and use of grab bar for steady assist.  Bathing completed in room shower at sit > stand level with use of grab bar when standing to wash buttocks. Noted improved strength and management of wash cloth and towel when washing and drying with no drops this session.  Pt demonstrating increased difficulty with hooking bra this session and due to limited time therapist assisted with donning bra.  Plan to further assess alternatives to increase independence with donning bra.  Pt reports completing grasp activities last night and demonstrated use of clothespins with increased time due to resistive clothespins and decreased sustained strength.  2) Treatment session with focus on LUE NMR and functional use.  Ambulated to therapy gym with increased difficulty and LOB with turns requiring min assist for stability. Engaged in dynamic sitting activity with focus on functional use of LUE with grasping and placing cards onto vertical velcro surface.  Pt demonstrating difficulty grasping items and sustaining grasp while placing items onto target.  Cues for increased grasp technique.  Pt demonstrating increased difficulty with  matching cards.  Pt verbalizing frustration with loss of function and independence and verbalizing feeling like she is a child again.  Therapy Documentation Precautions:  Precautions Precautions: Fall Restrictions Weight Bearing Restrictions: No General:   Vital Signs: Therapy Vitals Temp: 98.3 F (36.8 C) Temp Source: Oral Pulse Rate: 70 Resp: 16 BP: (!) 144/84 mmHg Patient Position (if appropriate): Lying Oxygen Therapy SpO2: 96 % O2 Device: Not Delivered Pain:  Pt with no c/o pain  See Function Navigator for Current Functional Status.   Therapy/Group: Individual Therapy  Simonne Come 01/27/2016, 9:47 AM

## 2016-01-28 ENCOUNTER — Inpatient Hospital Stay (HOSPITAL_COMMUNITY): Payer: Medicare Other | Admitting: Speech Pathology

## 2016-01-28 ENCOUNTER — Inpatient Hospital Stay (HOSPITAL_COMMUNITY): Payer: Medicare Other | Admitting: *Deleted

## 2016-01-28 ENCOUNTER — Inpatient Hospital Stay (HOSPITAL_COMMUNITY): Payer: Medicare Other | Admitting: Occupational Therapy

## 2016-01-28 ENCOUNTER — Inpatient Hospital Stay (HOSPITAL_COMMUNITY): Payer: Medicare Other | Admitting: Physical Therapy

## 2016-01-28 NOTE — Progress Notes (Signed)
Speech Language Pathology Daily Session Note  Patient Details  Name: MAKITA RIVARD MRN: [id number] Date of Birth: April 22, 1934  Today's Date: 01/28/2016 SLP Individual Time: 1140-1205 SLP Individual Time Calculation (min): 25 min  Short Term Goals: Week 1: SLP Short Term Goal 1 (Week 1): STG=LTG due to ELOS   Skilled Therapeutic Interventions:  Pt was seen for skilled ST targeting communication goals.  SLP facilitated the session with a semi-complex sentence generation task in a moderately distracting/noisy environment to address carryover of intelligibility strategies.  Pt was intelligible at the word/sentence level with mod I despite therapist generated and environmental distractions.  Pt was left sitting up in recliner with call bell left within reach.   Continue per current plan of care.    Function:  Eating Eating                 Cognition Comprehension Comprehension assist level: Follows complex conversation/direction with no assist  Expression   Expression assist level: Expresses complex 90% of the time/cues < 10% of the time  Social Interaction Social Interaction assist level: Interacts appropriately with others - No medications needed.  Problem Solving Problem solving assist level: Solves complex problems: With extra time  Memory Memory assist level: More than reasonable amount of time    Pain Pain Assessment Pain Assessment: No/denies pain  Therapy/Group: Individual Therapy  Stefhanie Kachmar, Selinda Orion 01/28/2016, 3:45 PM

## 2016-01-28 NOTE — Progress Notes (Signed)
Physical Therapy Session Note  Patient Details  Name: [name] MRN: [id number] Date of Birth: 03/23/1934  Today's Date: 01/28/2016 PT Group Time: 1330-1430 PT Group Time Calculation (min): 60 min  Short Term Goals: Week 1:  PT Short Term Goal 1 (Week 1): =LTG due to estimated LOS  Skilled Therapeutic Interventions/Progress Updates:     STRIDE RIGHT Ambulation Assist: Close S  Device:RW  Distance:  71x150'  Address: indicate with an x Gait controlled environment: x Gait home environment:  Balance: X - dynamic tasks including ball toss Endurance: x- Nustep x8min level 5 Postural control:x- Standing ball toss with close S Cognition: X multi-tasking naming foods in alpahbetic order during balance task Coordination: Safety: x Stairs: X 12x with 1 rail and close S, safety cues for foot on step  Comments: Seated rest with bil UEs rowing with 1# bar, LAQ and marching bil 2x10 with 3# weights     Therapy Documentation Precautions:  Precautions Precautions: Fall Restrictions Weight Bearing Restrictions: No  Pain:  none      See Function Navigator for Current Functional Status.   Therapy/Group: Group Therapy  Lavanda Nevels, Corinna Lines, PT, DPT  01/28/2016, 12:48 PM

## 2016-01-28 NOTE — Progress Notes (Signed)
Physical Therapy Session Note  Patient Details  Name: [name] MRN: [id number] Date of Birth: 02/18/1934  Today's Date: 01/28/2016 PT Individual Time: 1550-1630 PT Individual Time Calculation (min): 40 min   Short Term Goals: Week 1:  PT Short Term Goal 1 (Week 1): =LTG due to estimated LOS  Skilled Therapeutic Interventions/Progress Updates:    Patient received sitting in recliner and agreeable to PT. Patient requested to use RW for and ambulation because her legs were tired and she reports feeling more stable with RW present. Gait training performed with RW on level surface for 155ft, 220ft, 34ft, and 75 ft as well as 133ft and 3ft carpet and 34ft x 2 on uneven tiled patio. PT provided supervision A for all gait training with mod verbal and visual instruction for decreased LLE foot drag and increased L knee/hip flexion with gait to prevent foot drag. Patient improved foot clearance throughout treatment and was able to maintain clearance for entire last bout of gait training for 75 ft.   Patient instructed in Level A [place] exercise program with stair training With mod verbal and visual cues for proper exercise technique and positioning to improve quality of movements, as well as cues for proper UE support on RW to increase safety.   Patient left sitting in recliner with call bell within reach.   Therapy Documentation Precautions:  Precautions Precautions: Fall Restrictions Weight Bearing Restrictions: No General:   Vital Signs: Therapy Vitals Temp: 97.7 F (36.5 C) Temp Source: Oral Pulse Rate: (!) 58 Resp: 18 BP: 124/73 mmHg Patient Position (if appropriate): Sitting Oxygen Therapy SpO2: 100 % O2 Device: Not Delivered Pain: Pain Assessment Pain Assessment: No/denies pain   See Function Navigator for Current Functional Status.   Therapy/Group: Individual Therapy  Lorie Phenix 01/28/2016, 5:03 PM

## 2016-01-28 NOTE — Progress Notes (Signed)
Occupational Therapy Session Note  Patient Details  Name: [name] MRN: [id number] Date of Birth: 1934-07-18  Today's Date: 01/28/2016 OT Individual Time: [id number] OT Individual Time Calculation (min): 60 min    Short Term Goals: Week 1:  OT Short Term Goal 1 (Week 1): Pt will setup meal tray with use of LUE as gross assist with opening containers. OT Short Term Goal 2 (Week 1): Pt with complete grooming tasks in standing with supervision.   Skilled Therapeutic Interventions/Progress Updates: pt. Participated in skilled OT this session as follows:  Bed to in room shower chair transfer with close S Stood in shower for upper body bathing with close S; sat in shower chair and completed LB bathing with distant supervison  Stood at sink to complete oral care & other grooming tasks with distant supervision.  Sat edge of bed to complete UB dressing with distant S Went from sitting edge of bed to standing to don pants and panties Sat edge of bed to don socks  Patient left with call bell and phone within reach, sitting in her recliner at the end of the session      Therapy Documentation Precautions:  Precautions Precautions: Fall Restrictions Weight Bearing Restrictions: No Pain:denied    See Function Navigator for Current Functional Status.   Therapy/Group: Individual Therapy  Alfredia Ferguson [facility] 01/28/2016, 12:29 PM

## 2016-01-28 NOTE — Progress Notes (Signed)
[name] is a 82 y.o. female 09/06/1934 [id number]  Subjective: No new complaints. No new problems. Slept well. Feeling OK.  Objective: Vital signs in last 24 hours: Temp:  [97.7 F (36.5 C)-97.8 F (36.6 C)] 97.8 F (36.6 C) (05/20 0443) Pulse Rate:  [56-62] 60 (05/20 0754) Resp:  [18] 18 (05/20 0443) BP: (138-156)/(74-90) 152/90 mmHg (05/20 0754) SpO2:  [95 %-100 %] 96 % (05/20 0754) Weight change:  Last BM Date: 01/27/16  Intake/Output from previous day: 05/19 0701 - 05/20 0700 In: 1080 [P.O.:1080] Out: -  Last cbgs: CBG (last 3)  No results for input(s): GLUCAP in the last 72 hours.   Physical Exam General: No apparent distress  Eating breakfast HEENT: not dry Lungs: Normal effort. Lungs clear to auscultation, no crackles or wheezes. Cardiovascular: Regular rate and rhythm, no edema Abdomen: S/NT/ND; BS(+) Musculoskeletal:  unchanged Neurological: No new neurological deficits Wounds: N/A    Skin: clear  Aging changes Mental state: Alert, oriented, cooperative    Lab Results: BMET    Component Value Date/Time   NA 137 01/24/2016 0732   K 4.2 01/24/2016 0732   CL 102 01/24/2016 0732   CO2 22 01/24/2016 0732   GLUCOSE 93 01/24/2016 0732   BUN 15 01/24/2016 0732   CREATININE 0.86 01/24/2016 0732   CALCIUM 10.1 01/24/2016 0732   GFRNONAA >60 01/24/2016 0732   GFRAA >60 01/24/2016 0732   CBC    Component Value Date/Time   WBC 5.9 01/24/2016 0732   RBC 3.84* 01/24/2016 0732   HGB 11.9* 01/24/2016 0732   HCT 35.1* 01/24/2016 0732   PLT 150 01/24/2016 0732   MCV 91.4 01/24/2016 0732   MCH 31.0 01/24/2016 0732   MCHC 33.9 01/24/2016 0732   RDW 13.2 01/24/2016 0732   LYMPHSABS 1.8 01/24/2016 0732   MONOABS 0.7 01/24/2016 0732   EOSABS 0.2 01/24/2016 0732   BASOSABS 0.1 01/24/2016 0732    Studies/Results: No results found.  Medications: I have reviewed the patient's current medications.  Assessment/Plan:  1. Hemiparesis, post stroke  gait disorder secondary to propagation of right MCA infarct.   2. DVT Prophylaxis/Anticoagulation: Pharmaceutical: Lovenox 3. Pain Management: Tylenol prn 4. Mood: LCSW to follow for evaluation and support.  5. Neuropsych: This patient is capable of making decisions on her own behalf. 6. Skin/Wound Care: routine pressure relief measures.  7. Fluids/Electrolytes/Nutrition: Monitor I/O. Check lytes in am.  8. HTN: On labetalol 300 mg bid and lisinopril at bedtime. Hold Norvasc. Monitor BP tid--avoid hypotension with SBP 130-150 range 9. Dyslipidemia: On Zetia and Pravachol.  10. Hypothyroid: continue supplement 11. E coli UTI: resolved  12. Dyspepsia: on Pepcid.  13. Dehydration: eating and drinking well         Length of stay, days: 5  Walker Kehr , MD 01/28/2016, 9:46 AM

## 2016-01-29 ENCOUNTER — Inpatient Hospital Stay (HOSPITAL_COMMUNITY): Payer: Medicare Other

## 2016-01-29 NOTE — Progress Notes (Signed)
Occupational Therapy Note  Patient Details  Name: [name] MRN: [id number] Date of Birth: Dec 08, 1933  Today's Date: 01/29/2016 OT Group Time: 1430-1530 OT Group Time Calculation (min): 60 min   Pt denied pain Group Therapy  Pt participated in Therapeutic Activity Group with focus on dynamic standing and functional amb with RW.  Pt amb with RW to day room and engaged in game of Bocce requiring pt to bend over to retrieve balls from floor before rolling them towards target.  Pt required to use her LUE with focus on grasp and control.  Pt dropped the ball from her L hand X 5 during task and was instructed to attend to her L hand when using for functional tasks.  Pt returned to recliner in room with all needs within reach.    Leotis Shames [facility] 01/29/2016, 3:36 PM

## 2016-01-29 NOTE — Progress Notes (Signed)
[name] is a 82 y.o. female Nov 26, 1933 [id number]  Subjective: No new complaints. No new problems. Slept well.  Objective: Vital signs in last 24 hours: Temp:  [97.7 F (36.5 C)-97.9 F (36.6 C)] 97.9 F (36.6 C) (05/21 0522) Pulse Rate:  [58-66] 66 (05/21 0522) Resp:  [18] 18 (05/21 0522) BP: (124-166)/(73-98) 149/96 mmHg (05/21 0522) SpO2:  [97 %-100 %] 97 % (05/21 0522) Weight change:  Last BM Date: 01/28/16  Intake/Output from previous day: 05/20 0701 - 05/21 0700 In: 1080 [P.O.:1080] Out: -  Last cbgs: CBG (last 3)  No results for input(s): GLUCAP in the last 72 hours.   Physical Exam General: No apparent distress  Eating breakfast again when I come in HEENT: not dry Lungs: Normal effort. Lungs clear to auscultation, no crackles or wheezes. Cardiovascular: Regular rate and rhythm, no edema Abdomen: S/NT/ND; BS(+) Musculoskeletal:  unchanged Neurological: No new neurological deficits Wounds: N/A    Skin: clear  Aging changes Mental state: Alert, oriented, cooperative    Lab Results: BMET    Component Value Date/Time   NA 137 01/24/2016 0732   K 4.2 01/24/2016 0732   CL 102 01/24/2016 0732   CO2 22 01/24/2016 0732   GLUCOSE 93 01/24/2016 0732   BUN 15 01/24/2016 0732   CREATININE 0.86 01/24/2016 0732   CALCIUM 10.1 01/24/2016 0732   GFRNONAA >60 01/24/2016 0732   GFRAA >60 01/24/2016 0732   CBC    Component Value Date/Time   WBC 5.9 01/24/2016 0732   RBC 3.84* 01/24/2016 0732   HGB 11.9* 01/24/2016 0732   HCT 35.1* 01/24/2016 0732   PLT 150 01/24/2016 0732   MCV 91.4 01/24/2016 0732   MCH 31.0 01/24/2016 0732   MCHC 33.9 01/24/2016 0732   RDW 13.2 01/24/2016 0732   LYMPHSABS 1.8 01/24/2016 0732   MONOABS 0.7 01/24/2016 0732   EOSABS 0.2 01/24/2016 0732   BASOSABS 0.1 01/24/2016 0732    Studies/Results: No results found.  Medications: I have reviewed the patient's current medications.  Assessment/Plan:  1. Hemiparesis,  post stroke gait disorder secondary to propagation of right MCA infarct.   2. DVT Prophylaxis/Anticoagulation: Pharmaceutical: Lovenox 3. Pain Management: Tylenol prn 4. Mood: LCSW to follow for evaluation and support.  5. Neuropsych: This patient is capable of making decisions on her own behalf. 6. Skin/Wound Care: routine pressure relief measures.  7. Fluids/Electrolytes/Nutrition: Monitor I/O. Check lytes in am.  8. HTN: On labetalol 300 mg bid and lisinopril at bedtime. Hold Norvasc. Monitor BP tid--avoid hypotension with SBP 130-150 range 9. Dyslipidemia: On Zetia and Pravachol.  10. Hypothyroid: continue supplement 11. E coli UTI: resolved  12. Dyspepsia: on Pepcid.  13. Dehydration: eating and drinking well      Cont current Rx   Length of stay, days: 6  Walker Kehr , MD 01/29/2016, 9:06 AM

## 2016-01-30 ENCOUNTER — Inpatient Hospital Stay (HOSPITAL_COMMUNITY): Payer: Medicare Other | Admitting: Physical Therapy

## 2016-01-30 ENCOUNTER — Inpatient Hospital Stay (HOSPITAL_COMMUNITY): Payer: Medicare Other | Admitting: Speech Pathology

## 2016-01-30 ENCOUNTER — Inpatient Hospital Stay (HOSPITAL_COMMUNITY): Payer: Medicare Other | Admitting: Occupational Therapy

## 2016-01-30 LAB — CREATININE, SERUM
Creatinine, Ser: 0.77 mg/dL (ref 0.44–1.00)
GFR calc Af Amer: >60 mL/min (ref >60)

## 2016-01-30 NOTE — Progress Notes (Signed)
Subjective/Complaints: Asking about stroke risk for recurrence  ROS- Negative CP, SOB , NVD, Denies any problems with bowel or bladder  Objective: Vital Signs: Blood pressure 146/78, pulse 55, temperature 98.4 F (36.9 C), temperature source Oral, resp. rate 18, height 5\' 7"  (1.702 m), weight 91.899 kg (202 lb 9.6 oz), SpO2 93 %. No results found. No results found for this or any previous visit (from the past 72 hour(s)).   HEENT: normal and left facial droop Cardio: RRR and occ skipped beats Resp: CTA B/L and unlabored GI: BS positive and NT, ND Extremity:  No Edema Skin:   Intact Neuro: Alert/Oriented, Flat, Normal Sensory, Abnormal Motor 4- in Left delt, bi, tri, grip,4- HF, KE ADF, 5/5 on RIght side, Abnormal FMC Ataxic/ dec FMC and Dysarthric Musc/Skel:  Other no pain with UE or LE ROM Gen NAD   Assessment/Plan: 1. Functional deficits secondary to Right MCA infarct which require 3+ hours per day of interdisciplinary therapy in a comprehensive inpatient rehab setting. Physiatrist is providing close team supervision and 24 hour management of active medical problems listed below. Physiatrist and rehab team continue to assess barriers to discharge/monitor patient progress toward functional and medical goals. FIM: Function - Bathing Position: Other (comment) (standing in shower) Body parts bathed by patient: Right arm, Left arm, Chest, Abdomen, Front perineal area, Buttocks, Right upper leg, Left upper leg, Right lower leg, Left lower leg, Back Assist Level: Supervision or verbal cues  Function- Upper Body Dressing/Undressing What is the patient wearing?: Bra, Pull over shirt/dress Bra - Perfomed by patient: Thread/unthread left bra strap, Thread/unthread right bra strap Bra - Perfomed by helper: Hook/unhook bra (pull down sports bra) Pull over shirt/dress - Perfomed by patient: Thread/unthread right sleeve, Thread/unthread left sleeve, Put head through opening, Pull shirt  over trunk Assist Level: Set up (mod A) Set up : To obtain clothing/put away Function - Lower Body Dressing/Undressing What is the patient wearing?: Pants, Underwear, Shoes, Socks Position: Sitting EOB Underwear - Performed by patient: Thread/unthread right underwear leg, Thread/unthread left underwear leg, Pull underwear up/down Pants- Performed by patient: Thread/unthread right pants leg, Thread/unthread left pants leg, Pull pants up/down Pants- Performed by helper: Thread/unthread right pants leg, Thread/unthread left pants leg, Pull pants up/down, Fasten/unfasten pants Non-skid slipper socks- Performed by patient: Don/doff right sock, Don/doff left sock Socks - Performed by patient: Don/doff right sock, Don/doff left sock Shoes - Performed by patient: Don/doff right shoe, Don/doff left shoe, Fasten right, Fasten left Assist for footwear: Setup Assist for lower body dressing: Supervision or verbal cues, Set up Set up : To obtain clothing/put away  Function - Toileting Toileting steps completed by patient: Adjust clothing prior to toileting, Performs perineal hygiene, Adjust clothing after toileting Toileting steps completed by helper: Adjust clothing prior to toileting, Performs perineal hygiene, Adjust clothing after toileting Toileting Assistive Devices: Grab bar or rail Assist level: Supervision or verbal cues  Function - Toilet Transfers Toilet transfer assistive device: Grab bar Assist level to toilet: Supervision or verbal cues Assist level from toilet: Supervision or verbal cues Assist level to bedside commode (at bedside): Supervision or verbal cues Assist level from bedside commode (at bedside): Supervision or verbal cues  Function - Chair/bed transfer Chair/bed transfer method: Ambulatory Chair/bed transfer assist level: Touching or steadying assistance (Pt > 75%) Chair/bed transfer assistive device: Armrests Chair/bed transfer details: Tactile cues for posture, Verbal  cues for precautions/safety  Function - Locomotion: Wheelchair Will patient use wheelchair at discharge?: No Function - Locomotion:  Ambulation Assistive device: Walker-rolling Max distance: 347ft Assist level: Supervision or verbal cues Assist level: Supervision or verbal cues Assist level: Supervision or verbal cues Assist level: Supervision or verbal cues Assist level: Supervision or verbal cues  Function - Comprehension Comprehension: Auditory Comprehension assist level: Understands complex 90% of the time/cues 10% of the time  Function - Expression Expression: Verbal Expression assist level: Expresses complex 90% of the time/cues < 10% of the time  Function - Social Interaction Social Interaction assist level: Interacts appropriately with others - No medications needed.  Function - Problem Solving Problem solving assist level: Solves complex 90% of the time/cues < 10% of the time  Function - Memory Memory assist level: Recognizes or recalls 90% of the time/requires cueing < 10% of the time Patient normally able to recall (first 3 days only): Current season, Location of own room, Staff names and faces, That he or she is in a hospital   Medical Problem List and Plan: 1.  Hemiparesis, post stroke gait disorder secondary to propagation of right MCA infarct.planned d/c 5/24, cont PT, OT , CIR level,  , discussed stroke risk and modifiable risk factors   2.  DVT Prophylaxis/Anticoagulation: Pharmaceutical: Lovenox 3. Pain Management: Tylenol prn 4. Mood: LCSW to follow for evaluation and support.   5. Neuropsych: This patient is capable of making decisions on her own behalf. 6. Skin/Wound Care: routine pressure relief measures.   7. Fluids/Electrolytes/Nutrition: Monitor I/O. Check lytes in am.   8. HTN: On labetalol 300 mg bid and lisinopril at bedtime. Hold Norvasc. Monitor BP tid--avoid hypotension with SBP 130-150 range Filed Vitals:   01/29/16 2034 01/30/16 0512  BP:  164/89 146/78  Pulse: 59 55  Temp:  98.4 F (36.9 C)  Resp:  18   9. Dyslipidemia: On Zetia and Pravachol.  discussed low cholesterol diet, good fruit and vegetable intake 10. Hypothyroid: continue supplement   11. Dyspepsia: no further c/o on Pepcid.   13. Dehydration: now eating and drinking well   LOS (Days) 7 A FACE TO FACE EVALUATION WAS PERFORMED  Tykwon Fera E 01/30/2016, 7:04 AM

## 2016-01-30 NOTE — Progress Notes (Signed)
Occupational Therapy Session Note  Patient Details  Name: [name] MRN: [id number] Date of Birth: 1934/05/29  Today's Date: 01/30/2016 OT Individual Time: 0930-1015 and 1430-1500 OT Individual Time Calculation (min): 45 min and 30 min   Short Term Goals: Week 1:  OT Short Term Goal 1 (Week 1): Pt will setup meal tray with use of LUE as gross assist with opening containers. OT Short Term Goal 2 (Week 1): Pt with complete grooming tasks in standing with supervision.   Skilled Therapeutic Interventions/Progress Updates:    1)Treatment session with focus on self-care retraining and functional use of LUE.  Pt reports desire to shower.  Ambulated to room shower without AD at overall supervision level.  Bathing completed at overall supervision level with noted improvements in LUE motor control and strength during bathing.  Pt demonstrated increased difficulty with fastening bra around torso due to decreased sustained grasp against tension and difficulty with [place] to fasten bra.  Pt required assist to fasten bra and then pull over arms.  Pt demonstrating difficulty with donning shoes this session, reports that she would typically use a shoe horn plan to attempt during next session.    2) Treatment session with focus on UB dressing and safety in home environment.  Educated on donning bra with fastening hooks in lap prior to donning over body.  Pt required demonstration and verbal cues to correctly orient in this manner, but able to demonstrate increased success with fastening and donning bra.  Utilized Lt hand as gross assist to hold hook side of bra while fastening with Rt hand.  Ambulated to ADL kitchen with RW at overall Mod I level, pt reports preference to ambulate long distance with RW.  Engaged in simulated meal prep in kitchen with pt retrieving food items from refrigerator and supplies from drawers and lower cabinets.  Pt able to verbalize safety concerns in kitchen and demonstrate  appropriate safety.  Discussed use of RW in kitchen vs without with pt reporting understanding of recommendations.  Therapy Documentation Precautions:  Precautions Precautions: Fall Restrictions Weight Bearing Restrictions: No Pain:   Pt with no c/o pain  See Function Navigator for Current Functional Status.   Therapy/Group: Individual Therapy  Simonne Come 01/30/2016, 3:09 PM

## 2016-01-30 NOTE — Progress Notes (Signed)
Speech Language Pathology Daily Session Note  Patient Details  Name: [name] MRN: [id number] Date of Birth: 1933-12-18  Today's Date: 01/30/2016 SLP Individual Time: 1100-1200 SLP Individual Time Calculation (min): 60 min  Short Term Goals: Week 1: SLP Short Term Goal 1 (Week 1): STG=LTG due to ELOS   Skilled Therapeutic Interventions: Pt alert and pleasant. Very hard working. She reports that she has been completing oral mech exercises independently and practicing utilization of strategies to improve intelligibility. Pt required only min occasional verbal cueing to slow rate for improved intelligibility despite conversation re: familiar and unfamiliar topics. Pt verbalized appropriate problem solving around potential home safety issues.   Function:  Eating Eating   Modified Consistency Diet: No Eating Assist Level: No help, No cues           Cognition Comprehension Comprehension assist level: Understands complex 90% of the time/cues 10% of the time  Expression   Expression assist level: Expresses complex 90% of the time/cues < 10% of the time  Social Interaction Social Interaction assist level: Interacts appropriately with others - No medications needed.  Problem Solving Problem solving assist level: Solves complex 90% of the time/cues < 10% of the time  Memory Memory assist level: Recognizes or recalls 90% of the time/requires cueing < 10% of the time    Pain Pain Assessment Pain Assessment: No/denies pain  Therapy/Group: Individual Therapy  Vinetta Bergamo MA, CCC-SLP 01/30/2016, 4:27 PM

## 2016-01-30 NOTE — Progress Notes (Signed)
Physical Therapy Session Note  Patient Details  Name: [name] MRN: [id number] Date of Birth: 10-03-1933  Today's Date: 01/30/2016 PT Individual Time: 0800-0900 PT Individual Time Calculation (min): 60 min   Short Term Goals: Week 1:  PT Short Term Goal 1 (Week 1): =LTG due to estimated LOS  Skilled Therapeutic Interventions/Progress Updates:    Pt received seated on EOB completing breakfast; denies pain and agreeable to treatment. Gait into/out of bathroom with S; performed peri hygiene and clothing management with distant S. Pt requests to wash bottom and face at sink prior to dressing; performed standing at sink with S. Sit <>stand from EOB to perform upper and lower body dressing with S. Gait to gym with RW and S; note increased speed with RW, min cues for upright posture. Pt reports she has a RW at home but it's her son's so she would need to share with him. Will discuss with CSW. Nustep x10 min with BUE/BLE for strengthening and aerobic endurance; level 7 with average 45 steps/min. Stairs 2x12 6 inch height with B handrails and min cues for forward descent; S overall. 6 minute walk test with RW 274m; compared to norms for community dwelling adults age 21-79 years with average 472 m. Returned to room and remained supine in bed at completion of session with alarm intact and all needs within reach.   Therapy Documentation Precautions:  Precautions Precautions: Fall Restrictions Weight Bearing Restrictions: No  See Function Navigator for Current Functional Status.   Therapy/Group: Individual Therapy  [name] 01/30/2016, 9:08 AM

## 2016-01-31 ENCOUNTER — Inpatient Hospital Stay (HOSPITAL_COMMUNITY): Payer: Medicare Other | Admitting: Occupational Therapy

## 2016-01-31 ENCOUNTER — Inpatient Hospital Stay (HOSPITAL_COMMUNITY): Payer: Medicare Other | Admitting: *Deleted

## 2016-01-31 ENCOUNTER — Inpatient Hospital Stay (HOSPITAL_COMMUNITY): Payer: Medicare Other | Admitting: Speech Pathology

## 2016-01-31 ENCOUNTER — Inpatient Hospital Stay (HOSPITAL_COMMUNITY): Payer: Medicare Other | Admitting: Physical Therapy

## 2016-01-31 NOTE — Progress Notes (Signed)
Occupational Therapy Discharge Summary  Patient Details  Name: [name] MRN: [id number] Date of Birth: Mar 26, 1934  Patient has met 12 of 12 long term goals due to improved activity tolerance, improved balance, postural control, ability to compensate for deficits, functional use of  LEFT upper and LEFT lower extremity, improved awareness and improved coordination.  Patient to discharge at overall Modified Independent level.  Patient's care partner did not attend any formal family education, however pt is at overall Mod I level with ADLs and is able to verbalize her deficits and ask for assist as needed.  Pt's son does live with pt and can provide supervision as he is physically unable to assist pt.  Reasons goals not met: N/A  Recommendation:  Patient will benefit from ongoing skilled OT services in home health setting to continue to advance functional skills in the area of BADL and Reduce care partner burden.  Equipment: No equipment provided  Reasons for discharge: treatment goals met and discharge from hospital  Patient/family agrees with progress made and goals achieved: Yes  OT Discharge Precautions/Restrictions  Precautions Precautions: Fall General   Vital Signs Therapy Vitals Temp: 97.8 F (36.6 C) Temp Source: Oral Pulse Rate: 67 Resp: 17 BP: 119/77 mmHg Patient Position (if appropriate): Sitting Oxygen Therapy SpO2: 97 % O2 Device: Not Delivered Pain Pain Assessment Pain Assessment: No/denies pain ADL  See Function Navigator Vision/Perception  Vision- History Baseline Vision/History: Wears glasses Wears Glasses: At all times Patient Visual Report: No change from baseline Vision- Assessment Vision Assessment?: No apparent visual deficits Eye Alignment: Within Functional Limits Ocular Range of Motion: Within Functional Limits Alignment/Gaze Preference: Within Defined Limits Tracking/Visual Pursuits: Able to track stimulus in all quads without  difficulty Saccades: Within functional limits Visual Fields: No apparent deficits Depth Perception:  (WFL)  Cognition Overall Cognitive Status: Within Functional Limits for tasks assessed Arousal/Alertness: Awake/alert Orientation Level: Oriented X4 Safety/Judgment: Appears intact Sensation Sensation Light Touch: Appears Intact Stereognosis: Not tested Hot/Cold: Not tested Proprioception: Impaired by gross assessment Coordination Fine Motor Movements are Fluid and Coordinated: No Finger Nose Finger Test: Lt mild dysmetria with medial shift, Rt WFL Heel Shin Test: BLE WFL 9 Hole Peg Test: Rt: 37 seconds, Lt: placed 5 pegs in 2 min, 1:20 to place all 9 with therapist holding peg upright and 1:55 to place and remove pegs with therapist holding pegs upright Motor  Motor Motor: Hemiplegia Motor - Discharge Observations: decreased coordination LUE>LLE, L hemiparesis Extremity/Trunk Assessment RUE Assessment RUE Assessment: Within Functional Limits (ROM WFL, strength grossly 4+/5 overall) LUE Assessment LUE Assessment: Within Functional Limits (ROM WFL, strength grossly 4-/5 overall, loose gross grasp and difficulty maintaining fine motor grasp)   See Function Navigator for Current Functional Status.  Simonne Come 01/31/2016, 3:45 PM

## 2016-01-31 NOTE — Progress Notes (Signed)
Physical Therapy Discharge Summary  Patient Details  Name: [name] MRN: [id number] Date of Birth: 1934/06/06  Today's Date: 01/31/2016 PT Individual Time: 1000-1045 PT Individual Time Calculation (min): 45 min    Patient has met 10 of 11 long term goals due to improved activity tolerance, improved balance, improved postural control, increased strength, ability to compensate for deficits, functional use of  left upper extremity and left lower extremity, improved attention, improved awareness and improved coordination.  Patient to discharge at an ambulatory level Modified Independent.   Reasons goals not met: Floor transfer goal unmet due to pt declining to perform task  Recommendation:  Patient will benefit from ongoing skilled PT services in home health setting to continue to advance safe functional mobility, address ongoing impairments in balance, strength, coordination, activity tolerance, and minimize fall risk.  Equipment: RW  Reasons for discharge: treatment goals met  Patient/family agrees with progress made and goals achieved: Yes  PT Discharge Precautions/Restrictions Precautions Precautions: Fall Restrictions Weight Bearing Restrictions: No Pain Pain Assessment Pain Assessment: No/denies pain Vision/Perception    Vision WFL; no change from baseline Perception WFL Cognition Overall Cognitive Status: Within Functional Limits for tasks assessed Arousal/Alertness: Awake/alert Orientation Level: Oriented X4 Safety/Judgment: Appears intact Sensation Sensation Light Touch: Appears Intact Stereognosis: Not tested Hot/Cold: Not tested Proprioception: Impaired by gross assessment Coordination Fine Motor Movements are Fluid and Coordinated: No Finger Nose Finger Test: Lt mild dysmetria with medial shift, Rt WFL Heel Shin Test: BLE WFL Motor  Motor Motor: Hemiplegia Motor - Discharge Observations: decreased coordination LUE>LLE, L hemiparesis   Mobility Bed Mobility Bed Mobility: Supine to Sit;Sit to Supine Supine to Sit: 6: Modified independent (Device/Increase time) Sit to Supine: 6: Modified independent (Device/Increase time) Transfers Transfers: Yes Sit to Stand: 6: Modified independent (Device/Increase time) Stand Pivot Transfers: 6: Modified independent (Device/Increase time) Locomotion  Ambulation Ambulation: Yes Ambulation/Gait Assistance: 6: Modified independent (Device/Increase time) Ambulation Distance (Feet): 250 Feet Assistive device: Rolling walker Gait Gait: Yes Gait Pattern: Impaired Gait Pattern: Shuffle;Lateral hip instability;Decreased stride length Stairs / Additional Locomotion Stairs: Yes Stairs Assistance: 5: Supervision Stairs Assistance Details: Verbal cues for precautions/safety;Verbal cues for gait pattern Stair Management Technique: Alternating pattern;Forwards;Two rails Number of Stairs: 12 Height of Stairs: 6 Ramp: 5: Supervision Curb: 5: Supervision Wheelchair Mobility Wheelchair Mobility: No  Trunk/Postural Assessment  Cervical Assessment Cervical Assessment: Exceptions to [facility] (forward head posture) Thoracic Assessment Thoracic Assessment: Exceptions to [facility] (increased kyphosis, rounded shoulders) Lumbar Assessment Lumbar Assessment: Exceptions to [facility] Postural Control Postural Control: Deficits on evaluation (decreased efficiency of stepping strategies)  Balance Balance Balance Assessed: Yes Standardized Balance Assessment Standardized Balance Assessment: Berg Balance Test;Timed Up and Go Test Berg Balance Test Sit to Stand: Able to stand without using hands and stabilize independently Standing Unsupported: Able to stand safely 2 minutes Sitting with Back Unsupported but Feet Supported on Floor or Stool: Able to sit safely and securely 2 minutes Stand to Sit: Sits safely with minimal use of hands Transfers: Able to transfer safely, minor use of hands Standing Unsupported  with Eyes Closed: Able to stand 10 seconds with supervision Standing Ubsupported with Feet Together: Able to place feet together independently and stand for 1 minute with supervision From Standing, Reach Forward with Outstretched Arm: Reaches forward but needs supervision From Standing Position, Pick up Object from Floor: Able to pick up shoe, needs supervision From Standing Position, Turn to Look Behind Over each Shoulder: Looks behind from both sides and weight shifts well Turn 360 Degrees:  Needs close supervision or verbal cueing Standing Unsupported, Alternately Place Feet on Step/Stool: Able to complete 4 steps without aid or supervision Standing Unsupported, One Foot in Front: Able to plae foot ahead of the other independently and hold 30 seconds Standing on One Leg: Tries to lift leg/unable to hold 3 seconds but remains standing independently Total Score: 41 Timed Up and Go Test TUG: Normal TUG Static Sitting Balance Static Sitting - Balance Support: No upper extremity supported Static Sitting - Level of Assistance: 6: Modified independent (Device/Increase time) Dynamic Sitting Balance Dynamic Sitting - Balance Support: No upper extremity supported Dynamic Sitting - Level of Assistance: 6: Modified independent (Device/Increase time) Dynamic Sitting - Balance Activities: Forward lean/weight shifting;Lateral lean/weight shifting;Reaching across midline Static Standing Balance Static Standing - Level of Assistance: 6: Modified independent (Device/Increase time) Static Stance: Eyes closed Static Stance: Eyes Closed: S x30 sec Dynamic Standing Balance Dynamic Standing - Balance Support: Bilateral upper extremity supported Dynamic Standing - Level of Assistance: 6: Modified independent (Device/Increase time) Dynamic Standing - Balance Activities: Forward lean/weight shifting;Lateral lean/weight shifting;Reaching across midline Extremity Assessment   RUE WFL  LUE decreased coordination;  strength grossly WFL RLE Assessment RLE Assessment: Within Functional Limits LLE Assessment LLE Assessment: Within Functional Limits  Skilled Therapeutic Intervention: Pt received seated in bed, denies pain however does report lethargy/fatigue and "just feeling out of it today" but agreeable to treatment. Assessed all mobility as described above with mod I/S overall. Educated pt on home safety, recommendation to continue use of RW at home for safety, and progression to no AD to continue with HHPT. Pt agreeable and with no further questions/concerns regarding d/c or mobility at home. Requires several rest breaks throughout session and ultimately session ended 15 min early due to fatigue with pt returned to bed to rest. Remained supine with alarm intact and all needs within reach at completion of session.   See Function Navigator for Current Functional Status.  Carolynn Comment Tygielski 01/31/2016, 10:53 AM

## 2016-01-31 NOTE — Progress Notes (Signed)
Social Work Discharge Note  The overall goal for the admission was met for:   Discharge location: Yes - home  Length of Stay: Yes - 9 days  Discharge activity level: Yes - modified independent  Home/community participation: Yes  Services provided included: MD, RD, PT, OT, SLP, RN, Pharmacy and SW  Financial Services: Medicare and Private Insurance: [place]  Follow-up services arranged: Home Health: PT/OT/RN, DME: rolling walker and Patient/Family request agency HH: [facility], DME: [place]  Comments (or additional information): Pt feel ready for d/c and is looking forward to being at home.  Her son will be with her as needed for assistance, although most of pt's goals are modified independent.  Her son or friend will come to pick her up and help her with transition home.  Pt had [facility] in the past, so CSW made referral to them again and requested pt's same therapists as they are able to accommodate her.  Pt to use RW at home and in the community.  No other needs/concerns/questions at the time of d/c.  Patient/Family verbalized understanding of follow-up arrangements: Yes  Individual responsible for coordination of the follow-up plan: pt with assistance from her son, [name], as needed  Confirmed correct DME delivered: Trey Sailors 01/31/2016    Texanna Hilburn, Silvestre Mesi

## 2016-01-31 NOTE — Progress Notes (Signed)
Subjective/Complaints: Discussed need for cardiac event monitor prior to d/c  ROS- Negative CP, SOB , NVD, Denies any problems with bowel or bladder  Objective: Vital Signs: Blood pressure 152/85, pulse 66, temperature 97.8 F (36.6 C), temperature source Oral, resp. rate 18, height 5' 7"  (1.702 m), weight 91.899 kg (202 lb 9.6 oz), SpO2 98 %. No results found. Results for orders placed or performed during the hospital encounter of 01/23/16 (from the past 72 hour(s))  Creatinine, serum     Status: None   Collection Time: 01/30/16  6:01 AM  Result Value Ref Range   Creatinine, Ser 0.77 0.44 - 1.00 mg/dL   GFR calc non Af Amer >60 >60 mL/min   GFR calc Af Amer >60 >60 mL/min    Comment: (NOTE) The eGFR has been calculated using the CKD EPI equation. This calculation has not been validated in all clinical situations. eGFR's persistently <60 mL/min signify possible Chronic Kidney Disease.      HEENT: normal and left facial droop Cardio: RRR and occ skipped beats Resp: CTA B/L and unlabored GI: BS positive and NT, ND Extremity:  No Edema Skin:   Intact Neuro: Alert/Oriented, Flat, Normal Sensory, Abnormal Motor 4- in Left delt, bi, tri, grip,4- HF, KE ADF, 5/5 on RIght side, Abnormal FMC Ataxic/ dec FMC and Dysarthric Musc/Skel:  Other no pain with UE or LE ROM Gen NAD   Assessment/Plan: 1. Functional deficits secondary to Right MCA infarct which require 3+ hours per day of interdisciplinary therapy in a comprehensive inpatient rehab setting. Physiatrist is providing close team supervision and 24 hour management of active medical problems listed below. Physiatrist and rehab team continue to assess barriers to discharge/monitor patient progress toward functional and medical goals. FIM: Function - Bathing Position: Shower Body parts bathed by patient: Right arm, Left arm, Chest, Abdomen, Front perineal area, Buttocks, Right upper leg, Left upper leg, Right lower leg, Left  lower leg, Back Assist Level: Supervision or verbal cues  Function- Upper Body Dressing/Undressing What is the patient wearing?: Bra, Pull over shirt/dress Bra - Perfomed by patient: Thread/unthread right bra strap, Thread/unthread left bra strap Bra - Perfomed by helper: Hook/unhook bra (pull down sports bra) Pull over shirt/dress - Perfomed by patient: Thread/unthread right sleeve, Thread/unthread left sleeve, Put head through opening, Pull shirt over trunk Assist Level: More than reasonable time, Touching or steadying assistance(Pt > 75%) Set up : To obtain clothing/put away Function - Lower Body Dressing/Undressing What is the patient wearing?: Pants, Underwear, Socks, Shoes Position: Sitting EOB Underwear - Performed by patient: Thread/unthread right underwear leg, Thread/unthread left underwear leg, Pull underwear up/down Pants- Performed by patient: Thread/unthread right pants leg, Thread/unthread left pants leg, Pull pants up/down Pants- Performed by helper: Thread/unthread right pants leg, Thread/unthread left pants leg, Pull pants up/down, Fasten/unfasten pants Non-skid slipper socks- Performed by patient: Don/doff right sock, Don/doff left sock Socks - Performed by patient: Don/doff right sock Socks - Performed by helper: Don/doff left sock (Pt endurance waned) Shoes - Performed by patient: Don/doff left shoe Shoes - Performed by helper: Don/doff right shoe (Uses shoe horn at home. Assist and instructions provided at heel.) Assist for footwear: Setup Assist for lower body dressing: More than reasonable time, Touching or steadying assistance (Pt > 75%) Set up : To obtain clothing/put away  Function - Toileting Toileting activity did not occur: N/A Toileting steps completed by patient: Adjust clothing prior to toileting, Performs perineal hygiene, Adjust clothing after toileting Toileting steps completed by helper:  Adjust clothing prior to toileting, Adjust clothing after  toileting (per Leilani Able, NT) Toileting Assistive Devices: Grab bar or rail Assist level: Supervision or verbal cues  Function - Toilet Transfers Toilet transfer assistive device: Grab bar Assist level to toilet: Supervision or verbal cues Assist level from toilet: Supervision or verbal cues Assist level to bedside commode (at bedside): Supervision or verbal cues Assist level from bedside commode (at bedside): Moderate assist (Pt 50 - 74%/lift or lower)  Function - Chair/bed transfer Chair/bed transfer method: Ambulatory Chair/bed transfer assist level: Supervision or verbal cues Chair/bed transfer assistive device: Armrests Chair/bed transfer details: Tactile cues for posture, Verbal cues for precautions/safety  Function - Locomotion: Wheelchair Will patient use wheelchair at discharge?: No Function - Locomotion: Ambulation Assistive device: Walker-rolling Max distance: 794 (6MWT) Assist level: Supervision or verbal cues Assist level: Supervision or verbal cues Assist level: Supervision or verbal cues Assist level: Supervision or verbal cues Assist level: Supervision or verbal cues  Function - Comprehension Comprehension: Auditory Comprehension assist level: Understands complex 90% of the time/cues 10% of the time  Function - Expression Expression: Verbal Expression assist level: Expresses complex 90% of the time/cues < 10% of the time  Function - Social Interaction Social Interaction assist level: Interacts appropriately with others - No medications needed.  Function - Problem Solving Problem solving assist level: Solves complex 90% of the time/cues < 10% of the time  Function - Memory Memory assist level: Recognizes or recalls 90% of the time/requires cueing < 10% of the time Patient normally able to recall (first 3 days only): Current season, Location of own room, Staff names and faces, That he or she is in a hospital   Medical Problem List and Plan: 1.   Hemiparesis, post stroke gait disorder secondary to propagation of right MCA infarct.planned d/c 5/24, cont PT, OT , CIR level,  , discussed stroke risk and modifiable risk factors   2.  DVT Prophylaxis/Anticoagulation: Pharmaceutical: Lovenox 3. Pain Management: Tylenol prn 4. Mood: LCSW to follow for evaluation and support.   5. Neuropsych: This patient is capable of making decisions on her own behalf. 6. Skin/Wound Care: routine pressure relief measures.   7. Fluids/Electrolytes/Nutrition: Monitor I/O. Check lytes in am.   8. HTN: On labetalol 300 mg bid and lisinopril at bedtime. Hold Norvasc. Monitor BP tid--avoid hypotension with SBP 130-150 range Filed Vitals:   01/31/16 0429 01/31/16 0733  BP: 139/73 152/85  Pulse: 63 66  Temp: 97.8 F (36.6 C)   Resp: 18    9. Dyslipidemia: On Zetia and Pravachol.  low cholesterol diet, good fruit and vegetable intake 10. Hypothyroid: continue supplement   11. Dyspepsia: no further c/o on Pepcid.   13. Dehydration: now eating and drinking well   LOS (Days) 8 A FACE TO FACE EVALUATION WAS PERFORMED  KIRSTEINS,ANDREW E 01/31/2016, 7:39 AM

## 2016-01-31 NOTE — Discharge Summary (Signed)
Physician Discharge Summary  Patient ID: LENNOX KANTOR MRN: [id number] DOB/AGE: December 11, 1933 82 y.o.  Admit date: 01/23/2016 Discharge date: 02/01/2016  Discharge Diagnoses:  Principal Problem:   Acute ischemic right MCA stroke [facility]) Active Problems:   Gait disturbance, post-stroke   Hemiparesis (HCC)   Essential hypertension   Dyslipidemia   Thyroid activity decreased   Dyspepsia   Discharged Condition: Stable.    Labs:  Basic Metabolic Panel: BMP Latest Ref Rng 01/30/2016 01/24/2016 01/20/2016  Glucose 65 - 99 mg/dL - 93 96  BUN 6 - 20 mg/dL - 15 16  Creatinine 4.09 - 1.00 mg/dL 8.11 9.14 7.82  Sodium 135 - 145 mmol/L - 137 138  Potassium 3.5 - 5.1 mmol/L - 4.2 4.1  Chloride 101 - 111 mmol/L - 102 106  CO2 22 - 32 mmol/L - 22 20(L)  Calcium 8.9 - 10.3 mg/dL - 95.6 9.5     CBC: CBC Latest Ref Rng 01/24/2016 01/20/2016 01/19/2016  WBC 4.0 - 10.5 K/uL 5.9 6.2 6.6  Hemoglobin 12.0 - 15.0 g/dL 11.9(L) 11.1(L) 12.0  Hematocrit 36.0 - 46.0 % 35.1(L) 32.5(L) 34.4(L)  Platelets 150 - 400 K/uL 150 159 155     CBG: No results for input(s): GLUCAP in the last 168 hours.   Today's Vitals   01/31/16 2126 02/01/16 0550 02/01/16 0750 02/01/16 0758  BP: 170/76 152/71  148/70  Pulse: 56 66  64  Temp:  98.1 F (36.7 C)    TempSrc:  Oral    Resp:  18    Height:      Weight:  90.6 kg (199 lb 11.8 oz)    SpO2:  98%    PainSc: 0-No pain  0-No pain      Brief HPI:   CAROLYNA SNODDY is a 82 y.o. RH-female with history of HTN, OA, recurrent UTIs, CVA with left sided weakness with numbness and slurred speech at [facility] a week PTA with persistent left sided symptoms. She sustained a fall and struck her head on the toilet and was admitted for work up. She had been off plavix for a week for unclear reasons--question discontinued at discharged v/s stopped taking it. MRI brain showed mild local propagation of RIGHT MCA territory acute small infarcts and positive for moderate to severe  stenosis bilateral PCA P2 segments and L-VA distal segment and posterior R-MCA M2 occlusion 8mm beyond its orign. Dr. Roda Shutters recommended ASA/plavix for 3 months followed by plavix. She is to follow up with [facility] heart care for 30 day event monitor to rule out A fib due to M2 occlusion and avoid hypotension with SBP 130-150 range to allow for perfusion.Patient with resultant mild to moderate dysarthria, left sided weakness with decreased BOS as well as difficulty with ADL tasks. CIR recommended by rehab team for follow up    Hospital Course: CHANTALE NIGH was admitted to rehab 01/23/2016 for inpatient therapies to consist of PT, ST and OT at least three hours five days a week. Past admission physiatrist, therapy team and rehab RN have worked together to provide customized collaborative inpatient rehab. She was maintained on ASA and Plavix during her stay and has tolerated this without difficulty. She did report dyspepsia which has resolved with addition of Pepcid.  Po intake has improved and she is continent of bowel and bladder. Blood pressures were monitored on tid basis and has been reasonable on labetalol and lisinopril.  Anxiety has been managed with low dose klonopin.  Labs at admission revealed that  dehydration has resolved and repeat UCS was negative for infection.  Cardiology was contacted to reschedule appointment for placement of cardiac monitor after discharge.  She has had improvement in balance as well as [facility] of left hand. She has made good progress during her rehab stay and is modified independent at discharge. She will continue to receive follow up HHPT, HHOT and HHRN by [facility] after discharge.    Rehab course: During patient's stay in rehab weekly team conferences were held to monitor patient's progress, set goals and discuss barriers to discharge. At admission patient required min assist with basic self care task and mobility. She had mild to moderate dysarthria with  drooling due to left labial weakness.  She has had improvement in activity tolerance, balance, postural control, as well as ability to compensate for deficits. She is has had improvement in functional use LUE  and LLE as well as improved awareness. She is able to complete ADL tasks at modified independent level. She is modified independent for transfers and to ambulate 250' with RW.  Speech is 100% intelligible and she is using compensatory strategies  independently to help with clarity during conversation.      Disposition: 01-Home or Self Care  Diet:  Heart Healthy  Special Instructions: 1. No driving till cleared by MD.     Medication List    TAKE these medications        aspirin 325 MG tablet  Take 1 tablet (325 mg total) by mouth daily.     CALCIUM 500 + D PO  Take 500 mg by mouth 2 (two) times daily.     cholecalciferol 1000 units tablet  Commonly known as:  VITAMIN D  Take 2,000 Units by mouth daily.     clonazePAM 0.5 MG tablet--Rx # 30 pills   Commonly known as:  KLONOPIN  Take 1 tablet (0.5 mg total) by mouth daily as needed for anxiety. For anxiety     clopidogrel 75 MG tablet  Commonly known as:  PLAVIX  Take 1 tablet (75 mg total) by mouth daily.     CRANBERRY PLUS VITAMIN C 140-100-3 MG-MG-UNIT Caps  Generic drug:  Cranberry-Vitamin C-Vitamin E  Take 2,000 Units by mouth every morning.     ezetimibe 10 MG tablet  Commonly known as:  ZETIA  Take 1 tablet (10 mg total) by mouth at bedtime.     famotidine 10 MG tablet  Commonly known as:  PEPCID  Take 1 tablet (10 mg total) by mouth 2 (two) times daily.     Fish Oil 1000 MG Caps  Take 1,000 mg by mouth 2 (two) times daily.     folic acid 400 MCG tablet  Commonly known as:  FOLVITE  Take 400 mcg by mouth daily.     labetalol 200 MG tablet  Commonly known as:  NORMODYNE  Take 1.5 tablets (300 mg total) by mouth 2 (two) times daily.     lactose free nutrition Liqd  Take 237 mLs by mouth 3 (three)  times daily as needed (meal replacement).     levothyroxine 50 MCG tablet  Commonly known as:  SYNTHROID, LEVOTHROID  Take 50 mcg by mouth daily before breakfast.     lisinopril 40 MG tablet  Commonly known as:  PRINIVIL,ZESTRIL  Take 1 tablet (40 mg total) by mouth at bedtime.     polyethylene glycol packet  Commonly known as:  MIRALAX / GLYCOLAX  Take 17 g by mouth daily as needed  for mild constipation.     pravastatin 20 MG tablet  Commonly known as:  PRAVACHOL  Take 1 tablet (20 mg total) by mouth daily.     trimethoprim 100 MG tablet  Commonly known as:  TRIMPEX  Take 100 mg by mouth daily.       Follow-up Information    Follow up with Erick Colace, MD.   Specialty:  Physical Medicine and Rehabilitation   Why:  office will call you with follow up appointment   Contact information:   [address] [place] [address] [phone]       Follow up with Xu,Jindong, MD. Call today.   Specialty:  Neurology   Why:  for follow up appointment   Contact information:   [address] [place] [address] [phone]       Follow up with Lance Muss, MD On 03/08/2016.   Specialties:  Cardiology, Radiology, Interventional Cardiology   Why:  appointment at 11 am   Contact information:   [address] [place] [address] [phone]       Follow up with Paulina Fusi, MD On 02/09/2016.   Specialty:  Internal Medicine   Why:  @ 2PM   Contact information:   [address] [place] [address] [phone]       Signed: Jacquelynn Cree 02/01/2016, 5:17 PM

## 2016-01-31 NOTE — Progress Notes (Signed)
Speech Language Pathology Discharge Summary  Patient Details  Name: [name] MRN: [id number] Date of Birth: 01/30/1934  Today's Date: 01/31/2016 SLP Individual Time: 1500-1530 SLP Individual Time Calculation (min): 30 min   Skilled Therapeutic Interventions:  Pt was seen for skilled ST targeting communication goals and completion of education prior to discharge home tomorrow.  Pt was 100% intelligible independently during complex, functional conversations with therapist.  SLP reviewed and reinforced compensatory intelligibility strategies with pt.  SLP also discussed realistic prognostic expectations for recovery given progress made thus far.  Pt continues to be concerned with the way her speech sounds and, as a result, may benefit from follow up ST services at next level of care.  Pt left sitting in straight back chair with call bell within reach.  Continue per current plan of care.      Patient has met 1 of 1 long term goals.  Patient to discharge at overall Independent level.  Reasons goals not met: n/a   Clinical Impression/Discharge Summary:  Pt made excellent gains while inpatient and is discharging having met 1 out of 1 long term goals.  Pt is independent for achieving intelligibility in conversations but continues to have concerns with the way her speech sounds due to decreased articulatory precision.  Pt is discharging home with 24/7 supervision from family.  Pt education is complete at this time.  Recommend outpatient ST services to continue to address dysarthria.    Care Partner:  Caregiver Able to Provide Assistance: Yes  Type of Caregiver Assistance: Physical;Cognitive  Recommendation:  Outpatient SLP  Rationale for SLP Follow Up: Maximize functional communication   Equipment: none recommended by SLP    Reasons for discharge: Discharged from hospital   Patient/Family Agrees with Progress Made and Goals Achieved: Yes   Function:  Eating Eating                 Cognition Comprehension Comprehension assist level: Follows complex conversation/direction with extra time/assistive device  Expression   Expression assist level: Expresses complex ideas: With no assist  Social Interaction Social Interaction assist level: Interacts appropriately with others with medication or extra time (anti-anxiety, antidepressant).  Problem Solving Problem solving assist level: Solves complex problems: With extra time  Memory Memory assist level: More than reasonable amount of time   Emilio Math 01/31/2016, 4:17 PM

## 2016-01-31 NOTE — Progress Notes (Signed)
Recreational Therapy Session Note  Patient Details  Name: [name] MRN: [id number] Date of Birth: [birth year] Today's Date: 01/31/2016  Pain: no c/o Skilled Therapeutic Interventions/Progress Updates: Pt participated in community reintegration/outing to grocery store at supervision ambulatory level using RW.  Goals focused as safe community mobility, identification/negotiation of obstacles, retrieving items from various heights of shelves, energy conservation & intelligeable speech.  See outing goal sheet for full details.  Therapy/Group: [employer] Daphnie Venturini 01/31/2016, 3:28 PM

## 2016-01-31 NOTE — Progress Notes (Signed)
Occupational Therapy Session Note  Patient Details  Name: [name] MRN: [id number] Date of Birth: Mar 20, 1934  Today's Date: 01/31/2016 OT Concurrent Time: 1300-1425 OT Concurrent Time Calculation (min): 85 min   Short Term Goals: Week 1:  OT Short Term Goal 1 (Week 1): Pt will setup meal tray with use of LUE as gross assist with opening containers. OT Short Term Goal 2 (Week 1): Pt with complete grooming tasks in standing with supervision.   Skilled Therapeutic Interventions/Progress Updates:    Engaged in community integration/outing to grocery store at overall supervision level with RW and grocery cart.  Addressed safety with community mobility, identification/negotiation of obstacles, receiving items from various heights of shelves, functional use of LUE, energy conservation, and intelligible speech.  Pt overall supervision with mobility, requiring min cues to incorporate LUE when retrieving items and awareness of when to use dominant UE for safety.  Recommend supervision when participating in community tasks.  Discussed energy conservation strategies with making lists, using more familiar stores, and going during off peak hours.  See outing goal sheet for full details.    Therapy Documentation Precautions:  Precautions Precautions: Fall Restrictions Weight Bearing Restrictions: No Pain:  Pt with no c/o pain  See Function Navigator for Current Functional Status.   Therapy/Group:Concurrent therapy  Simonne Come 01/31/2016, 3:11 PM

## 2016-01-31 NOTE — Progress Notes (Signed)
Occupational Therapy Session Note  Patient Details  Name: [name] MRN: [id number] Date of Birth: Oct 31, 1933  Today's Date: 01/31/2016 OT Individual Time: [id number] OT Individual Time Calculation (min): 60 min    Short Term Goals: Week 1:  OT Short Term Goal 1 (Week 1): Pt will setup meal tray with use of LUE as gross assist with opening containers. OT Short Term Goal 2 (Week 1): Pt with complete grooming tasks in standing with supervision.   Skilled Therapeutic Interventions/Progress Updates:    1) Completed ADL retraining at overall Mod I level.  Pt gathered clothing prior to bathing and dressing with increased time and demonstrating good safety awareness when bending to retrieve items from bottom dresser drawer.  Bathing completed at sit > stand level in room shower with improved sustained use of LUE during bathing and drying off.  Dressing completed at sit > stand level from EOB with pt able to complete all tasks with increased time after multiple attempts at donning bra.  Pt able to fasten bra on lap and then don over head.  Completed 9 hole peg test with Rt: 37 seconds and Lt: placing 5 pegs in 2 mins.  During second attempt therapist holding pegs to decrease manipulation of pegs and pt able to place all 9 in 1:20 and then place and remove in 1:55 with therapist holding pegs. Discussed progress from eval and continued to encourage participation in [facility] HEP at home.   Therapy Documentation Precautions:  Precautions Precautions: Fall Restrictions Weight Bearing Restrictions: No General:   Vital Signs: Therapy Vitals Pulse Rate: 66 BP: (!) 152/85 mmHg Pain:  Pt with no c/o pain  See Function Navigator for Current Functional Status.   Therapy/Group: Individual Therapy  Simonne Come 01/31/2016, 8:55 AM

## 2016-02-01 MED ORDER — CLOPIDOGREL BISULFATE 75 MG PO TABS: 75.0000 mg | ORAL_TABLET | Freq: Every day | ORAL | Status: DC

## 2016-02-01 MED ORDER — EZETIMIBE 10 MG PO TABS: 10.0000 mg | ORAL_TABLET | Freq: Every day | ORAL | Status: DC

## 2016-02-01 MED ORDER — LISINOPRIL 40 MG PO TABS: 40.0000 mg | ORAL_TABLET | Freq: Every day | ORAL | Status: DC

## 2016-02-01 MED ORDER — CLONAZEPAM 0.5 MG PO TABS: 0.5000 mg | ORAL_TABLET | Freq: Every day | ORAL | Status: DC | PRN

## 2016-02-01 MED ORDER — PRAVASTATIN SODIUM 20 MG PO TABS: 20.0000 mg | ORAL_TABLET | Freq: Every day | ORAL | Status: DC

## 2016-02-01 MED ORDER — FAMOTIDINE 10 MG PO TABS: 10.0000 mg | ORAL_TABLET | Freq: Two times a day (BID) | ORAL | Status: DC

## 2016-02-01 MED ORDER — LABETALOL HCL 200 MG PO TABS: 300.0000 mg | ORAL_TABLET | Freq: Two times a day (BID) | ORAL | Status: DC

## 2016-02-01 MED ORDER — ASPIRIN 325 MG PO TABS: 325.0000 mg | ORAL_TABLET | Freq: Every day | ORAL | Status: DC

## 2016-02-01 NOTE — Progress Notes (Signed)
Patient discharged to home with son after receiving discharge instructions form D. [place], [place]. Patient and son verbalized understanding of dc instructions.  Mliss Sax

## 2016-02-01 NOTE — Progress Notes (Signed)
Subjective/Complaints: Discussed need for cardiac event monitor to be picked up at [facility] street post d/c  ROS- Negative CP, SOB , NVD, Denies any problems with bowel or bladder  Objective: Vital Signs: Blood pressure 152/71, pulse 66, temperature 98.1 F (36.7 C), temperature source Oral, resp. rate 18, height 5' 7"  (1.702 m), weight 90.6 kg (199 lb 11.8 oz), SpO2 98 %. No results found. Results for orders placed or performed during the hospital encounter of 01/23/16 (from the past 72 hour(s))  Creatinine, serum     Status: None   Collection Time: 01/30/16  6:01 AM  Result Value Ref Range   Creatinine, Ser 0.77 0.44 - 1.00 mg/dL   GFR calc non Af Amer >60 >60 mL/min   GFR calc Af Amer >60 >60 mL/min    Comment: (NOTE) The eGFR has been calculated using the CKD EPI equation. This calculation has not been validated in all clinical situations. eGFR's persistently <60 mL/min signify possible Chronic Kidney Disease.      HEENT: normal and left facial droop Cardio: RRR and occ skipped beats Resp: CTA B/L and unlabored GI: BS positive and NT, ND Extremity:  No Edema Skin:   Intact Neuro: Alert/Oriented, Flat, Normal Sensory, Abnormal Motor 4- in Left delt, bi, tri, grip,4- HF, KE ADF, 5/5 on RIght side, Abnormal FMC Ataxic/ dec FMC and Dysarthric Musc/Skel:  Other no pain with UE or LE ROM Gen NAD   Assessment/Plan: 1. Functional deficits secondary to Right MCA infarct  Stable for D/C today F/u PCP in 3-4 weeks F/u PM&R 2 weeks See D/C summary See D/C instructions FIM: Function - Bathing Position: Shower Body parts bathed by patient: Right arm, Left arm, Chest, Abdomen, Front perineal area, Buttocks, Right upper leg, Left upper leg, Right lower leg, Left lower leg, Back Assist Level: More than reasonable time  Function- Upper Body Dressing/Undressing What is the patient wearing?: Pull over shirt/dress, Bra Bra - Perfomed by patient: Thread/unthread  right bra strap, Thread/unthread left bra strap, Hook/unhook bra (pull down sports bra) Bra - Perfomed by helper: Hook/unhook bra (pull down sports bra) Pull over shirt/dress - Perfomed by patient: Thread/unthread right sleeve, Thread/unthread left sleeve, Put head through opening, Pull shirt over trunk Assist Level: More than reasonable time Set up : To obtain clothing/put away Function - Lower Body Dressing/Undressing What is the patient wearing?: Underwear, Pants, Socks, Shoes Position: Sitting EOB Underwear - Performed by patient: Thread/unthread right underwear leg, Thread/unthread left underwear leg, Pull underwear up/down Pants- Performed by patient: Thread/unthread right pants leg, Thread/unthread left pants leg, Pull pants up/down Pants- Performed by helper: Thread/unthread right pants leg, Thread/unthread left pants leg, Pull pants up/down, Fasten/unfasten pants Non-skid slipper socks- Performed by patient: Don/doff right sock, Don/doff left sock Socks - Performed by patient: Don/doff right sock, Don/doff left sock Socks - Performed by helper: Don/doff left sock (Pt endurance waned) Shoes - Performed by patient: Don/doff right shoe, Don/doff left shoe (Slip on shoes) Shoes - Performed by helper: Don/doff right shoe (Uses shoe horn at home. Assist and instructions provided at heel.) Assist for footwear: Independent Assist for lower body dressing: More than reasonable time Set up : To obtain clothing/put away  Function - Toileting Toileting activity did not occur: No continent bowel/bladder event Toileting steps completed by patient: Adjust clothing prior to toileting, Performs perineal hygiene, Adjust clothing after toileting Toileting steps completed by helper: Adjust clothing prior to toileting, Adjust clothing after toileting (per Leilani Able, NT) Toileting  Assistive Devices: Grab bar or rail Assist level: Supervision or verbal cues  Function - Air cabin crew  transfer assistive device: Grab bar Assist level to toilet: Supervision or verbal cues Assist level from toilet: Supervision or verbal cues Assist level to bedside commode (at bedside): Supervision or verbal cues Assist level from bedside commode (at bedside): Moderate assist (Pt 50 - 74%/lift or lower)  Function - Chair/bed transfer Chair/bed transfer method: Ambulatory Chair/bed transfer assist level: No Help, no cues, assistive device, takes more than a reasonable amount of time Chair/bed transfer assistive device: Armrests, Walker Chair/bed transfer details: Tactile cues for posture, Verbal cues for precautions/safety  Function - Locomotion: Wheelchair Will patient use wheelchair at discharge?: No Function - Locomotion: Ambulation Assistive device: Walker-rolling Max distance: 250 Assist level: No help, No cues, assistive device, takes more than a reasonable amount of time Assist level: No help, No cues, assistive device, takes more than a reasonable amount of time Assist level: No help, No cues, assistive device, takes more than a reasonable amount of time Assist level: No help, No cues, assistive device, takes more than a reasonable amount of time Assist level: Supervision or verbal cues  Function - Comprehension Comprehension: Auditory Comprehension assist level: Understands complex 90% of the time/cues 10% of the time  Function - Expression Expression: Verbal Expression assist level: Expresses complex 90% of the time/cues < 10% of the time  Function - Social Interaction Social Interaction assist level: Interacts appropriately with others - No medications needed.  Function - Problem Solving Problem solving assist level: Solves complex 90% of the time/cues < 10% of the time  Function - Memory Memory assist level: Recognizes or recalls 90% of the time/requires cueing < 10% of the time Patient normally able to recall (first 3 days only): Current season, Location of own  room, Staff names and faces, That he or she is in a hospital   Medical Problem List and Plan: 1.  Hemiparesis, post stroke gait disorder secondary to propagation of right MCA infarct.planned d/c Today   2.  DVT Prophylaxis/Anticoagulation: Pharmaceutical: Lovenox 3. Pain Management: Tylenol prn 4. Mood: LCSW to follow for evaluation and support.   5. Neuropsych: This patient is capable of making decisions on her own behalf. 6. Skin/Wound Care: routine pressure relief measures.   7. Fluids/Electrolytes/Nutrition: Monitor I/O. Check lytes in am.   8. HTN: On labetalol 300 mg bid and lisinopril at bedtime. Hold Norvasc. Monitor BP tid--avoid hypotension with SBP 130-150 range Filed Vitals:   01/31/16 2126 02/01/16 0550  BP: 170/76 152/71  Pulse: 56 66  Temp:  98.1 F (36.7 C)  Resp:  18   9. Dyslipidemia: On Zetia and Pravachol.  low cholesterol diet, good fruit and vegetable intake 10. Hypothyroid: continue supplement   11. Dyspepsia: no further c/o on Pepcid.   13. Dehydration: now eating and drinking well   LOS (Days) 9 A FACE TO FACE EVALUATION WAS PERFORMED  Cynia Abruzzo E 02/01/2016, 7:41 AM

## 2016-02-01 NOTE — Progress Notes (Signed)
Patient resting at this time . Reviewed signs and symptoms of stroke with patient. Reviewed discharge medications. Heels remain boggy but skin intact with foam dressing. Continue with plan of care.  Mliss Sax

## 2016-02-01 NOTE — Discharge Instructions (Signed)
Inpatient Rehab Discharge Instructions  AAYLIAH RELIHAN Discharge date and time: 02/01/16   Activities/Precautions/ Functional Status: Activity: no lifting, driving, or strenuous exercise till cleared by MD. Diet: cardiac diet Wound Care: none needed   Functional status:  ___ No restrictions     ___ Walk up steps independently ___ 24/7 supervision/assistance   ___ Walk up steps with assistance _X__ Intermittent supervision/assistance  ___ Bathe/dress independently _X__ Walk with walker    ___ Bathe/dress with assistance ___ Walk Independently    ___ Shower independently ___ Walk with assistance    ___ Shower with assistance _X__ No alcohol     ___ Return to work/school ________  COMMUNITY REFERRALS UPON DISCHARGE:   Home Health:   PT     OT     RN  Agency:  [facility] Phone:  [phone] Medical Equipment/Items Ordered:  Rolling walker  Agency/Supplier:  [facility]           Phone:  [phone]  GENERAL COMMUNITY RESOURCES FOR PATIENT/FAMILY: Support Groups:  [facility] Stroke Support Group                              Meets the 2nd Thursday of the month at [facility] (except in June, July, and August)                              In the dayroom of Inpatient Rehab at [facility] [phone])   Special Instructions: 1. Cardiology to contact you for appointment for cardiac monitor placement.    STROKE/TIA DISCHARGE INSTRUCTIONS SMOKING Cigarette smoking nearly doubles your risk of having a stroke & is the single most alterable risk factor  If you smoke or have smoked in the last 12 months, you are advised to quit smoking for your health.  Most of the excess cardiovascular risk related to smoking disappears within a year of stopping.  Ask you doctor about anti-smoking medications  [place] Quit Line: 1-800-QUIT NOW  Free Smoking Cessation Classes [phone]  CHOLESTEROL Know your levels; limit fat & cholesterol in your diet  Lipid Panel       Component Value Date/Time   CHOL 163 01/20/2016 0605   TRIG 225* 01/20/2016 0605   HDL 23* 01/20/2016 0605   CHOLHDL 7.1 01/20/2016 0605   VLDL 45* 01/20/2016 0605   [place] 95 01/20/2016 0605      Many patients benefit from treatment even if their cholesterol is at goal.  Goal: Total Cholesterol (CHOL) less than 160  Goal:  Triglycerides (TRIG) less than 150  Goal:  HDL greater than 40  Goal:  LDL (LDLCALC) less than 100   BLOOD PRESSURE American Stroke Association blood pressure target is less that 120/80 mm/Hg  Your discharge blood pressure is:  BP: (!) 148/70 mmHg  Monitor your blood pressure  Limit your salt and alcohol intake  Many individuals will require more than one medication for high blood pressure  DIABETES (A1c is a blood sugar average for last 3 months) Goal HGBA1c is under 7% (HBGA1c is blood sugar average for last 3 months)  Diabetes: No known diagnosis of diabetes    Lab Results  Component Value Date   HGBA1C 5.5 01/20/2016     Your HGBA1c can be lowered with medications, healthy diet, and exercise.  Check your blood sugar as directed by your physician  Call your physician if you experience unexplained or low blood sugars.  PHYSICAL ACTIVITY/REHABILITATION Goal is 30 minutes at least 4 days per week  Activity: No driving, Therapies: See above Return to work: N/A  Activity decreases your risk of heart attack and stroke and makes your heart stronger.  It helps control your weight and blood pressure; helps you relax and can improve your mood.  Participate in a regular exercise program.  Talk with your doctor about the best form of exercise for you (dancing, walking, swimming, cycling).  DIET/WEIGHT Goal is to maintain a healthy weight  Your discharge diet is: Diet Heart Room service appropriate?: Yes; Fluid consistency:: Thin  liquids Your height is:  Height: 5\' 7"  (170.2 cm) Your current weight is: Weight: 90.6 kg (199 lb 11.8 oz) Your Body Mass  Index (BMI) is:  BMI (Calculated): 31.8  Following the type of diet specifically designed for you will help prevent another stroke.  Your goal weight is:  159 lbs  Your goal Body Mass Index (BMI) is 19-24.  Healthy food habits can help reduce 3 risk factors for stroke:  High cholesterol, hypertension, and excess weight.  RESOURCES Stroke/Support Group:  Call [phone]   STROKE EDUCATION PROVIDED/REVIEWED AND GIVEN TO PATIENT Stroke warning signs and symptoms How to activate emergency medical system (call 911). Medications prescribed at discharge. Need for follow-up after discharge. Personal risk factors for stroke. Pneumonia vaccine given:  Flu vaccine given:  My questions have been answered, the writing is legible, and I understand these instructions.  I will adhere to these goals & educational materials that have been provided to me after my discharge from the hospital.      My questions have been answered and I understand these instructions. I will adhere to these goals and the provided educational materials after my discharge from the hospital.  Patient/Caregiver Signature _______________________________ Date __________  Clinician Signature _______________________________________ Date __________  Please bring this form and your medication list with you to all your follow-up doctor's appointments.

## 2016-02-02 ENCOUNTER — Telehealth: Payer: Self-pay

## 2016-02-02 DIAGNOSIS — I69354 Hemiplegia and hemiparesis following cerebral infarction affecting left non-dominant side: Secondary | ICD-10-CM | POA: Diagnosis not present

## 2016-02-02 DIAGNOSIS — Z9181 History of falling: Secondary | ICD-10-CM | POA: Diagnosis not present

## 2016-02-02 DIAGNOSIS — Z7902 Long term (current) use of antithrombotics/antiplatelets: Secondary | ICD-10-CM | POA: Diagnosis not present

## 2016-02-02 DIAGNOSIS — E785 Hyperlipidemia, unspecified: Secondary | ICD-10-CM | POA: Diagnosis not present

## 2016-02-02 DIAGNOSIS — I69328 Other speech and language deficits following cerebral infarction: Secondary | ICD-10-CM | POA: Diagnosis not present

## 2016-02-02 DIAGNOSIS — Z7982 Long term (current) use of aspirin: Secondary | ICD-10-CM | POA: Diagnosis not present

## 2016-02-02 DIAGNOSIS — I10 Essential (primary) hypertension: Secondary | ICD-10-CM | POA: Diagnosis not present

## 2016-02-02 NOTE — Telephone Encounter (Signed)
Transitional Care Questions   1. Are you/is patient experiencing any problems since coming home? No Problems. Are there any questions regarding any aspect of care? None at this time.   2. Are there any questions regarding medications administration/dosing? No. Are meds being taken as prescribed? Yes, patient is taking as prescribed.  Patient should review meds with caller to confirm. All medications have been reviewed verbally with the patient.   3. Have there been any falls?  No Falls.   4. Has Home Health been to the house and/or have they contacted you? Yes. Patient states that home health has been to the house today. If not, have you tried to contact them? Can we help you contact them?   5. Are bowels and bladder emptying properly? Yes, no issues.  Are there any unexpected incontinence issues? None. If applicable, is patient following bowel/bladder programs?   6. Any fevers, problems with breathing, unexpected pain? No fevers, trouble breathing or unexpected pain.   7. Are there any skin problems or new areas of breakdown? No Issues.   8. Has the patient/family member arranged specialty MD follow up (ie cardiology/neurology/renal/surgical/etc)? Can we help arrange? Patient states that son is in charge of this and will work on it tomorrow. Patient will let us know if we can be of any assistance.   9. Does the patient need any other services or support that we can help arrange? No services at this time.   10. Are caregivers following through as expected in assisting the patient? Patient states that son is helping out.   11. Has the patient quit smoking, drinking alcohol, or using drugs as recommended? Patient does not smoke, drink, or use illicit drugs.   Patient advised of appointment with Dr. Letta Pate on June 5th, 2017 @ 12:45pm. Patient verbalized understanding and patient packet has been mailed.

## 2016-02-05 DIAGNOSIS — E785 Hyperlipidemia, unspecified: Secondary | ICD-10-CM | POA: Diagnosis not present

## 2016-02-05 DIAGNOSIS — Z7982 Long term (current) use of aspirin: Secondary | ICD-10-CM | POA: Diagnosis not present

## 2016-02-05 DIAGNOSIS — I10 Essential (primary) hypertension: Secondary | ICD-10-CM | POA: Diagnosis not present

## 2016-02-05 DIAGNOSIS — I69354 Hemiplegia and hemiparesis following cerebral infarction affecting left non-dominant side: Secondary | ICD-10-CM | POA: Diagnosis not present

## 2016-02-05 DIAGNOSIS — I69328 Other speech and language deficits following cerebral infarction: Secondary | ICD-10-CM | POA: Diagnosis not present

## 2016-02-05 DIAGNOSIS — Z9181 History of falling: Secondary | ICD-10-CM | POA: Diagnosis not present

## 2016-02-07 DIAGNOSIS — I69354 Hemiplegia and hemiparesis following cerebral infarction affecting left non-dominant side: Secondary | ICD-10-CM | POA: Diagnosis not present

## 2016-02-07 DIAGNOSIS — I69328 Other speech and language deficits following cerebral infarction: Secondary | ICD-10-CM | POA: Diagnosis not present

## 2016-02-07 DIAGNOSIS — Z9181 History of falling: Secondary | ICD-10-CM | POA: Diagnosis not present

## 2016-02-07 DIAGNOSIS — Z7982 Long term (current) use of aspirin: Secondary | ICD-10-CM | POA: Diagnosis not present

## 2016-02-07 DIAGNOSIS — I10 Essential (primary) hypertension: Secondary | ICD-10-CM | POA: Diagnosis not present

## 2016-02-07 DIAGNOSIS — E785 Hyperlipidemia, unspecified: Secondary | ICD-10-CM | POA: Diagnosis not present

## 2016-02-08 DIAGNOSIS — I69354 Hemiplegia and hemiparesis following cerebral infarction affecting left non-dominant side: Secondary | ICD-10-CM | POA: Diagnosis not present

## 2016-02-08 DIAGNOSIS — Z7982 Long term (current) use of aspirin: Secondary | ICD-10-CM | POA: Diagnosis not present

## 2016-02-08 DIAGNOSIS — I10 Essential (primary) hypertension: Secondary | ICD-10-CM | POA: Diagnosis not present

## 2016-02-08 DIAGNOSIS — E785 Hyperlipidemia, unspecified: Secondary | ICD-10-CM | POA: Diagnosis not present

## 2016-02-08 DIAGNOSIS — Z9181 History of falling: Secondary | ICD-10-CM | POA: Diagnosis not present

## 2016-02-08 DIAGNOSIS — I69328 Other speech and language deficits following cerebral infarction: Secondary | ICD-10-CM | POA: Diagnosis not present

## 2016-02-09 DIAGNOSIS — Z7982 Long term (current) use of aspirin: Secondary | ICD-10-CM | POA: Diagnosis not present

## 2016-02-09 DIAGNOSIS — I69354 Hemiplegia and hemiparesis following cerebral infarction affecting left non-dominant side: Secondary | ICD-10-CM | POA: Diagnosis not present

## 2016-02-09 DIAGNOSIS — I69328 Other speech and language deficits following cerebral infarction: Secondary | ICD-10-CM | POA: Diagnosis not present

## 2016-02-09 DIAGNOSIS — I10 Essential (primary) hypertension: Secondary | ICD-10-CM | POA: Diagnosis not present

## 2016-02-09 DIAGNOSIS — E785 Hyperlipidemia, unspecified: Secondary | ICD-10-CM | POA: Diagnosis not present

## 2016-02-09 DIAGNOSIS — Z9181 History of falling: Secondary | ICD-10-CM | POA: Diagnosis not present

## 2016-02-10 DIAGNOSIS — Z9181 History of falling: Secondary | ICD-10-CM | POA: Diagnosis not present

## 2016-02-10 DIAGNOSIS — I69328 Other speech and language deficits following cerebral infarction: Secondary | ICD-10-CM | POA: Diagnosis not present

## 2016-02-10 DIAGNOSIS — I69354 Hemiplegia and hemiparesis following cerebral infarction affecting left non-dominant side: Secondary | ICD-10-CM | POA: Diagnosis not present

## 2016-02-10 DIAGNOSIS — I10 Essential (primary) hypertension: Secondary | ICD-10-CM | POA: Diagnosis not present

## 2016-02-10 DIAGNOSIS — Z7982 Long term (current) use of aspirin: Secondary | ICD-10-CM | POA: Diagnosis not present

## 2016-02-10 DIAGNOSIS — E785 Hyperlipidemia, unspecified: Secondary | ICD-10-CM | POA: Diagnosis not present

## 2016-02-13 ENCOUNTER — Ambulatory Visit (HOSPITAL_BASED_OUTPATIENT_CLINIC_OR_DEPARTMENT_OTHER): Payer: Medicare Other | Admitting: Physical Medicine & Rehabilitation

## 2016-02-13 ENCOUNTER — Encounter: Payer: Self-pay | Admitting: Physical Medicine & Rehabilitation

## 2016-02-13 ENCOUNTER — Encounter: Payer: Medicare Other | Attending: Physical Medicine & Rehabilitation

## 2016-02-13 VITALS — BP 137/77 | HR 71 | Resp 14

## 2016-02-13 DIAGNOSIS — Z87440 Personal history of urinary (tract) infections: Secondary | ICD-10-CM | POA: Diagnosis not present

## 2016-02-13 DIAGNOSIS — E7800 Pure hypercholesterolemia, unspecified: Secondary | ICD-10-CM | POA: Insufficient documentation

## 2016-02-13 DIAGNOSIS — R531 Weakness: Secondary | ICD-10-CM | POA: Diagnosis not present

## 2016-02-13 DIAGNOSIS — I63511 Cerebral infarction due to unspecified occlusion or stenosis of right middle cerebral artery: Secondary | ICD-10-CM | POA: Diagnosis not present

## 2016-02-13 DIAGNOSIS — M1990 Unspecified osteoarthritis, unspecified site: Secondary | ICD-10-CM | POA: Diagnosis not present

## 2016-02-13 DIAGNOSIS — R269 Unspecified abnormalities of gait and mobility: Secondary | ICD-10-CM | POA: Diagnosis not present

## 2016-02-13 DIAGNOSIS — I69398 Other sequelae of cerebral infarction: Secondary | ICD-10-CM | POA: Diagnosis not present

## 2016-02-13 DIAGNOSIS — I69328 Other speech and language deficits following cerebral infarction: Secondary | ICD-10-CM | POA: Diagnosis not present

## 2016-02-13 DIAGNOSIS — I10 Essential (primary) hypertension: Secondary | ICD-10-CM | POA: Diagnosis not present

## 2016-02-13 NOTE — Progress Notes (Signed)
Subjective:    Patient ID: [name], female    DOB: 15-Aug-1934, 81 y.o.   MRN: [id number] 82 y.o. RH-female with history of HTN, OA, recurrent UTIs, CVA with left sided weakness with numbness and slurred speech at [facility] a week PTA with persistent left sided symptoms. She sustained a fall and struck her head on the toilet and was admitted for work up. She had been off plavix for a week for unclear reasons--question discontinued at discharged v/s stopped taking it. MRI brain showed mild local propagation of RIGHT MCA territory acute small infarcts and positive for moderate to severe stenosis bilateral PCA P2 segments and L-VA distal segment and posterior R-MCA M2 occlusion 8mm beyond its orign. Dr. Roda Shutters recommended ASA/plavix for 3 months followed by plavix. She is to follow up with [facility] heart care for 30 day event monitor to rule out A fib due to M2 occlusion and avoid hypotension .  HPI Pt also has hx of bilateral knee replacements, working with PT.  Has handicapped accessible home.  Walks up and down drive way with therapy, 578' with PT. No falls since d/c Mod I shower with hand held shower and shower chair Lives with her son.Her son ambulates with crutches for longer distances. At home he does not use any assisted device. He has chronic back problems. He sounds like he has some lower extending weakness related to his back. Patient was driving prior to her stroke. She was not using an assistive device prior to to her stroke.  She gets stressed out by her older sister who expects her to still help with various household duties.    Pain Inventory Average Pain 0 Pain Right Now 0 My pain is no pain  In the last 24 hours, has pain interfered with the following? General activity 0 Relation with others 0 Enjoyment of life 0 What TIME of day is your pain at its worst? no pain Sleep (in general) Good  Pain is worse with: no pain Pain improves with: no pain Relief from Meds: no  pain  Mobility walk with assistance use a walker ability to climb steps?  yes do you drive?  yes Do you have any goals in this area?  yes  Function retired  Neuro/Psych bladder control problems weakness anxiety  Prior Studies hospittal f/u  Physicians involved in your care hospital f/u   Family History  Problem Relation Age of Onset  . Hypertension Son    Social History   Social History  . Marital Status: Married    Spouse Name: N/A  . Number of Children: N/A  . Years of Education: N/A   Social History Main Topics  . Smoking status: Never Smoker   . Smokeless tobacco: Never Used  . Alcohol Use: No  . Drug Use: No  . Sexual Activity: No   Other Topics Concern  . None   Social History Narrative   Past Surgical History  Procedure Laterality Date  . Muscle biopsy Left 1990's    "knot biopsy from carrying mail bag for years"  . Bladder suspension  2012  . Splenic artery embolization  01/28/2014  . Joint replacement    . Total knee arthroplasty Bilateral 2012  . Vaginal hysterectomy    . Revison of arteriovenous fistula Right 01/31/2014    Procedure: REPAIR PSEUDOANEURYSM;  Surgeon: Nada Libman, MD;  Location: [facility] OR;  Service: Vascular;  Laterality: Right;  Repair of Femoral Pseudoaneurysm.  . Femur im nail Right 03/23/2014  Procedure: INTRAMEDULLARY (IM) NAIL FEMORAL;  Surgeon: Shelda Pal, MD;  Location: [facility] ORS;  Service: Orthopedics;  Laterality: Right;  . Visceral angiogram N/A 01/28/2014    Procedure: VISCERAL ANGIOGRAM;  Surgeon: Fransisco Hertz, MD;  Location: [facility] CATH LAB;  Service: Cardiovascular;  Laterality: N/A;  . Embolization N/A 01/28/2014    Procedure: EMBOLIZATION;  Surgeon: Fransisco Hertz, MD;  Location: [facility] CATH LAB;  Service: Cardiovascular;  Laterality: N/A;  Splenic Artery   Past Medical History  Diagnosis Date  . Hypertension   . High cholesterol   . Stroke [facility]) 1990's    "mild", denies residual on 01/28/2014  . Pneumonia      "maybe in my teens"  . Hypothyroidism   . Anemia   . History of blood transfusion     "S/P knee OR"  . Arthritis     "was in my knees"  . Recurrent UTI (urinary tract infection)    BP 137/77 mmHg  Pulse 71  Resp 14  SpO2 98%  Opioid Risk Score:   Fall Risk Score:  `1  Depression screen PHQ 2/9  Depression screen PHQ 2/9 02/13/2016  Decreased Interest 0  Down, Depressed, Hopeless 0  PHQ - 2 Score 0  Altered sleeping 0  Tired, decreased energy 3  Change in appetite 0  Feeling bad or failure about yourself  0  Trouble concentrating 0  Moving slowly or fidgety/restless 0  Suicidal thoughts 0  PHQ-9 Score 3  Difficult doing work/chores Somewhat difficult     Review of Systems  Respiratory: Positive for shortness of breath.   All other systems reviewed and are negative.      Objective:   Physical Exam  Constitutional: She is oriented to person, place, and time. She appears well-developed and well-nourished.  HENT:  Head: Normocephalic and atraumatic.  Eyes: Conjunctivae and EOM are normal. Pupils are equal, round, and reactive to light.  Cardiovascular: Normal rate, regular rhythm and normal heart sounds.   Pulmonary/Chest: Effort normal and breath sounds normal. No respiratory distress.  Abdominal: Soft. Bowel sounds are normal. She exhibits no distension. There is no tenderness.  Neurological: She is alert and oriented to person, place, and time.  Psychiatric: She has a normal mood and affect.  Nursing note and vitals reviewed.  Oriented to person  , month and year and city, not date Motor strength is 4/5 bilateral hip flexor and extensor ankle dorsiflexor plantar flexor 3+ in the left deltoid, biceps, triceps, grip 4+ in the right deltoid, biceps, triceps, grip Patient has decreased fine motor finger-thumb opposition in the left upper extremity she has dysdiadochokinesis in rapid alternating movements supination pronation left upper extremity Sensation intact to  light touch in bilateral upper and lower limbs. Gait is using a rolling walker. She has no evidence of toe drag or knee instability.     Assessment & Plan:  1.Right frontoparietal infarct with residual left upper extremity weakness as well as gait disorder. Would continue home health OT and PT. At this point I would recommend no driving. I discussed with both the patient and her son that she needs to concentrate on her own rehabilitation and she is in no position to provide household help for her sister. Follow-up physical medicine rehabilitation 1 month  2. Anxiety,  Patient has not been taking chronic benzodiazepines. She did get 30 tablets of Klonopin from the hospital and Has taken about 3.Appears to be situational related to her sister. 3. Neuropsych: This patient is  capable of making decisions on her own behalf.I do think she will be able to resume driving from a cognitive standpoint as well as a physical standpoint but we'll wait another month  4 HTN: On labetalol 300 mg bid and lisinopril 40 mg at bedtime. Norvasc 5mg .Follow-up primary care 9. Dyslipidemia: On Zetia and Pravachol. Follow-up primary care

## 2016-02-13 NOTE — Patient Instructions (Addendum)
No driving  You need to focus on your recovery.  You currently need help herself and are not in a position where you can help others with their household needs.

## 2016-02-14 DIAGNOSIS — I10 Essential (primary) hypertension: Secondary | ICD-10-CM | POA: Diagnosis not present

## 2016-02-14 DIAGNOSIS — Z9181 History of falling: Secondary | ICD-10-CM | POA: Diagnosis not present

## 2016-02-14 DIAGNOSIS — I69354 Hemiplegia and hemiparesis following cerebral infarction affecting left non-dominant side: Secondary | ICD-10-CM | POA: Diagnosis not present

## 2016-02-14 DIAGNOSIS — I69328 Other speech and language deficits following cerebral infarction: Secondary | ICD-10-CM | POA: Diagnosis not present

## 2016-02-14 DIAGNOSIS — Z7982 Long term (current) use of aspirin: Secondary | ICD-10-CM | POA: Diagnosis not present

## 2016-02-14 DIAGNOSIS — E785 Hyperlipidemia, unspecified: Secondary | ICD-10-CM | POA: Diagnosis not present

## 2016-02-15 ENCOUNTER — Ambulatory Visit (INDEPENDENT_AMBULATORY_CARE_PROVIDER_SITE_OTHER): Payer: Medicare Other

## 2016-02-15 DIAGNOSIS — I639 Cerebral infarction, unspecified: Secondary | ICD-10-CM

## 2016-02-15 DIAGNOSIS — E785 Hyperlipidemia, unspecified: Secondary | ICD-10-CM | POA: Diagnosis not present

## 2016-02-15 DIAGNOSIS — I10 Essential (primary) hypertension: Secondary | ICD-10-CM | POA: Diagnosis not present

## 2016-02-15 DIAGNOSIS — I69328 Other speech and language deficits following cerebral infarction: Secondary | ICD-10-CM | POA: Diagnosis not present

## 2016-02-15 DIAGNOSIS — I4891 Unspecified atrial fibrillation: Secondary | ICD-10-CM

## 2016-02-15 DIAGNOSIS — Z7982 Long term (current) use of aspirin: Secondary | ICD-10-CM | POA: Diagnosis not present

## 2016-02-15 DIAGNOSIS — I63419 Cerebral infarction due to embolism of unspecified middle cerebral artery: Secondary | ICD-10-CM

## 2016-02-15 DIAGNOSIS — I69354 Hemiplegia and hemiparesis following cerebral infarction affecting left non-dominant side: Secondary | ICD-10-CM | POA: Diagnosis not present

## 2016-02-15 DIAGNOSIS — Z9181 History of falling: Secondary | ICD-10-CM | POA: Diagnosis not present

## 2016-02-16 DIAGNOSIS — I69354 Hemiplegia and hemiparesis following cerebral infarction affecting left non-dominant side: Secondary | ICD-10-CM | POA: Diagnosis not present

## 2016-02-16 DIAGNOSIS — Z7982 Long term (current) use of aspirin: Secondary | ICD-10-CM | POA: Diagnosis not present

## 2016-02-16 DIAGNOSIS — I10 Essential (primary) hypertension: Secondary | ICD-10-CM | POA: Diagnosis not present

## 2016-02-16 DIAGNOSIS — Z9181 History of falling: Secondary | ICD-10-CM | POA: Diagnosis not present

## 2016-02-16 DIAGNOSIS — E785 Hyperlipidemia, unspecified: Secondary | ICD-10-CM | POA: Diagnosis not present

## 2016-02-16 DIAGNOSIS — I69328 Other speech and language deficits following cerebral infarction: Secondary | ICD-10-CM | POA: Diagnosis not present

## 2016-02-17 DIAGNOSIS — Z9181 History of falling: Secondary | ICD-10-CM | POA: Diagnosis not present

## 2016-02-17 DIAGNOSIS — E785 Hyperlipidemia, unspecified: Secondary | ICD-10-CM | POA: Diagnosis not present

## 2016-02-17 DIAGNOSIS — I10 Essential (primary) hypertension: Secondary | ICD-10-CM | POA: Diagnosis not present

## 2016-02-17 DIAGNOSIS — I69328 Other speech and language deficits following cerebral infarction: Secondary | ICD-10-CM | POA: Diagnosis not present

## 2016-02-17 DIAGNOSIS — Z7982 Long term (current) use of aspirin: Secondary | ICD-10-CM | POA: Diagnosis not present

## 2016-02-17 DIAGNOSIS — I69354 Hemiplegia and hemiparesis following cerebral infarction affecting left non-dominant side: Secondary | ICD-10-CM | POA: Diagnosis not present

## 2016-02-20 DIAGNOSIS — I69354 Hemiplegia and hemiparesis following cerebral infarction affecting left non-dominant side: Secondary | ICD-10-CM | POA: Diagnosis not present

## 2016-02-20 DIAGNOSIS — Z9181 History of falling: Secondary | ICD-10-CM | POA: Diagnosis not present

## 2016-02-20 DIAGNOSIS — Z7982 Long term (current) use of aspirin: Secondary | ICD-10-CM | POA: Diagnosis not present

## 2016-02-20 DIAGNOSIS — I69328 Other speech and language deficits following cerebral infarction: Secondary | ICD-10-CM | POA: Diagnosis not present

## 2016-02-20 DIAGNOSIS — E785 Hyperlipidemia, unspecified: Secondary | ICD-10-CM | POA: Diagnosis not present

## 2016-02-20 DIAGNOSIS — I10 Essential (primary) hypertension: Secondary | ICD-10-CM | POA: Diagnosis not present

## 2016-02-21 DIAGNOSIS — I69354 Hemiplegia and hemiparesis following cerebral infarction affecting left non-dominant side: Secondary | ICD-10-CM | POA: Diagnosis not present

## 2016-02-21 DIAGNOSIS — Z7982 Long term (current) use of aspirin: Secondary | ICD-10-CM | POA: Diagnosis not present

## 2016-02-21 DIAGNOSIS — I69328 Other speech and language deficits following cerebral infarction: Secondary | ICD-10-CM | POA: Diagnosis not present

## 2016-02-21 DIAGNOSIS — E785 Hyperlipidemia, unspecified: Secondary | ICD-10-CM | POA: Diagnosis not present

## 2016-02-21 DIAGNOSIS — I10 Essential (primary) hypertension: Secondary | ICD-10-CM | POA: Diagnosis not present

## 2016-02-21 DIAGNOSIS — Z9181 History of falling: Secondary | ICD-10-CM | POA: Diagnosis not present

## 2016-02-22 DIAGNOSIS — I69328 Other speech and language deficits following cerebral infarction: Secondary | ICD-10-CM | POA: Diagnosis not present

## 2016-02-22 DIAGNOSIS — Z7982 Long term (current) use of aspirin: Secondary | ICD-10-CM | POA: Diagnosis not present

## 2016-02-22 DIAGNOSIS — Z9181 History of falling: Secondary | ICD-10-CM | POA: Diagnosis not present

## 2016-02-22 DIAGNOSIS — E785 Hyperlipidemia, unspecified: Secondary | ICD-10-CM | POA: Diagnosis not present

## 2016-02-22 DIAGNOSIS — I10 Essential (primary) hypertension: Secondary | ICD-10-CM | POA: Diagnosis not present

## 2016-02-22 DIAGNOSIS — I69354 Hemiplegia and hemiparesis following cerebral infarction affecting left non-dominant side: Secondary | ICD-10-CM | POA: Diagnosis not present

## 2016-02-23 DIAGNOSIS — E785 Hyperlipidemia, unspecified: Secondary | ICD-10-CM | POA: Diagnosis not present

## 2016-02-23 DIAGNOSIS — I10 Essential (primary) hypertension: Secondary | ICD-10-CM | POA: Diagnosis not present

## 2016-02-23 DIAGNOSIS — I69354 Hemiplegia and hemiparesis following cerebral infarction affecting left non-dominant side: Secondary | ICD-10-CM | POA: Diagnosis not present

## 2016-02-23 DIAGNOSIS — I69328 Other speech and language deficits following cerebral infarction: Secondary | ICD-10-CM | POA: Diagnosis not present

## 2016-02-23 DIAGNOSIS — Z9181 History of falling: Secondary | ICD-10-CM | POA: Diagnosis not present

## 2016-02-23 DIAGNOSIS — Z7982 Long term (current) use of aspirin: Secondary | ICD-10-CM | POA: Diagnosis not present

## 2016-02-24 DIAGNOSIS — I69354 Hemiplegia and hemiparesis following cerebral infarction affecting left non-dominant side: Secondary | ICD-10-CM | POA: Diagnosis not present

## 2016-02-24 DIAGNOSIS — I10 Essential (primary) hypertension: Secondary | ICD-10-CM | POA: Diagnosis not present

## 2016-02-24 DIAGNOSIS — I69328 Other speech and language deficits following cerebral infarction: Secondary | ICD-10-CM | POA: Diagnosis not present

## 2016-02-24 DIAGNOSIS — E785 Hyperlipidemia, unspecified: Secondary | ICD-10-CM | POA: Diagnosis not present

## 2016-02-24 DIAGNOSIS — Z7982 Long term (current) use of aspirin: Secondary | ICD-10-CM | POA: Diagnosis not present

## 2016-02-24 DIAGNOSIS — Z9181 History of falling: Secondary | ICD-10-CM | POA: Diagnosis not present

## 2016-02-27 DIAGNOSIS — Z9181 History of falling: Secondary | ICD-10-CM | POA: Diagnosis not present

## 2016-02-27 DIAGNOSIS — I69328 Other speech and language deficits following cerebral infarction: Secondary | ICD-10-CM | POA: Diagnosis not present

## 2016-02-27 DIAGNOSIS — I10 Essential (primary) hypertension: Secondary | ICD-10-CM | POA: Diagnosis not present

## 2016-02-27 DIAGNOSIS — N390 Urinary tract infection, site not specified: Secondary | ICD-10-CM | POA: Diagnosis not present

## 2016-02-27 DIAGNOSIS — R7301 Impaired fasting glucose: Secondary | ICD-10-CM | POA: Diagnosis not present

## 2016-02-27 DIAGNOSIS — I63511 Cerebral infarction due to unspecified occlusion or stenosis of right middle cerebral artery: Secondary | ICD-10-CM | POA: Diagnosis not present

## 2016-02-27 DIAGNOSIS — E785 Hyperlipidemia, unspecified: Secondary | ICD-10-CM | POA: Diagnosis not present

## 2016-02-27 DIAGNOSIS — Z7982 Long term (current) use of aspirin: Secondary | ICD-10-CM | POA: Diagnosis not present

## 2016-02-27 DIAGNOSIS — I69398 Other sequelae of cerebral infarction: Secondary | ICD-10-CM | POA: Diagnosis not present

## 2016-02-27 DIAGNOSIS — Z79899 Other long term (current) drug therapy: Secondary | ICD-10-CM | POA: Diagnosis not present

## 2016-02-27 DIAGNOSIS — Z6831 Body mass index (BMI) 31.0-31.9, adult: Secondary | ICD-10-CM | POA: Diagnosis not present

## 2016-02-27 DIAGNOSIS — E039 Hypothyroidism, unspecified: Secondary | ICD-10-CM | POA: Diagnosis not present

## 2016-02-27 DIAGNOSIS — F419 Anxiety disorder, unspecified: Secondary | ICD-10-CM | POA: Diagnosis not present

## 2016-02-27 DIAGNOSIS — I69354 Hemiplegia and hemiparesis following cerebral infarction affecting left non-dominant side: Secondary | ICD-10-CM | POA: Diagnosis not present

## 2016-02-28 DIAGNOSIS — I10 Essential (primary) hypertension: Secondary | ICD-10-CM | POA: Diagnosis not present

## 2016-02-28 DIAGNOSIS — Z9181 History of falling: Secondary | ICD-10-CM | POA: Diagnosis not present

## 2016-02-28 DIAGNOSIS — Z7982 Long term (current) use of aspirin: Secondary | ICD-10-CM | POA: Diagnosis not present

## 2016-02-28 DIAGNOSIS — I69328 Other speech and language deficits following cerebral infarction: Secondary | ICD-10-CM | POA: Diagnosis not present

## 2016-02-28 DIAGNOSIS — I69354 Hemiplegia and hemiparesis following cerebral infarction affecting left non-dominant side: Secondary | ICD-10-CM | POA: Diagnosis not present

## 2016-02-28 DIAGNOSIS — E785 Hyperlipidemia, unspecified: Secondary | ICD-10-CM | POA: Diagnosis not present

## 2016-02-29 DIAGNOSIS — I69328 Other speech and language deficits following cerebral infarction: Secondary | ICD-10-CM | POA: Diagnosis not present

## 2016-02-29 DIAGNOSIS — I69354 Hemiplegia and hemiparesis following cerebral infarction affecting left non-dominant side: Secondary | ICD-10-CM | POA: Diagnosis not present

## 2016-02-29 DIAGNOSIS — I10 Essential (primary) hypertension: Secondary | ICD-10-CM | POA: Diagnosis not present

## 2016-02-29 DIAGNOSIS — E785 Hyperlipidemia, unspecified: Secondary | ICD-10-CM | POA: Diagnosis not present

## 2016-02-29 DIAGNOSIS — Z7982 Long term (current) use of aspirin: Secondary | ICD-10-CM | POA: Diagnosis not present

## 2016-02-29 DIAGNOSIS — Z9181 History of falling: Secondary | ICD-10-CM | POA: Diagnosis not present

## 2016-03-01 DIAGNOSIS — I10 Essential (primary) hypertension: Secondary | ICD-10-CM | POA: Diagnosis not present

## 2016-03-01 DIAGNOSIS — Z9181 History of falling: Secondary | ICD-10-CM | POA: Diagnosis not present

## 2016-03-01 DIAGNOSIS — I69354 Hemiplegia and hemiparesis following cerebral infarction affecting left non-dominant side: Secondary | ICD-10-CM | POA: Diagnosis not present

## 2016-03-01 DIAGNOSIS — Z7982 Long term (current) use of aspirin: Secondary | ICD-10-CM | POA: Diagnosis not present

## 2016-03-01 DIAGNOSIS — E785 Hyperlipidemia, unspecified: Secondary | ICD-10-CM | POA: Diagnosis not present

## 2016-03-01 DIAGNOSIS — I69328 Other speech and language deficits following cerebral infarction: Secondary | ICD-10-CM | POA: Diagnosis not present

## 2016-03-02 DIAGNOSIS — I69354 Hemiplegia and hemiparesis following cerebral infarction affecting left non-dominant side: Secondary | ICD-10-CM | POA: Diagnosis not present

## 2016-03-02 DIAGNOSIS — Z7982 Long term (current) use of aspirin: Secondary | ICD-10-CM | POA: Diagnosis not present

## 2016-03-02 DIAGNOSIS — I10 Essential (primary) hypertension: Secondary | ICD-10-CM | POA: Diagnosis not present

## 2016-03-02 DIAGNOSIS — E785 Hyperlipidemia, unspecified: Secondary | ICD-10-CM | POA: Diagnosis not present

## 2016-03-02 DIAGNOSIS — I69328 Other speech and language deficits following cerebral infarction: Secondary | ICD-10-CM | POA: Diagnosis not present

## 2016-03-02 DIAGNOSIS — Z9181 History of falling: Secondary | ICD-10-CM | POA: Diagnosis not present

## 2016-03-05 DIAGNOSIS — E785 Hyperlipidemia, unspecified: Secondary | ICD-10-CM | POA: Diagnosis not present

## 2016-03-05 DIAGNOSIS — Z9181 History of falling: Secondary | ICD-10-CM | POA: Diagnosis not present

## 2016-03-05 DIAGNOSIS — I69328 Other speech and language deficits following cerebral infarction: Secondary | ICD-10-CM | POA: Diagnosis not present

## 2016-03-05 DIAGNOSIS — Z7982 Long term (current) use of aspirin: Secondary | ICD-10-CM | POA: Diagnosis not present

## 2016-03-05 DIAGNOSIS — I69354 Hemiplegia and hemiparesis following cerebral infarction affecting left non-dominant side: Secondary | ICD-10-CM | POA: Diagnosis not present

## 2016-03-05 DIAGNOSIS — I10 Essential (primary) hypertension: Secondary | ICD-10-CM | POA: Diagnosis not present

## 2016-03-07 DIAGNOSIS — I69354 Hemiplegia and hemiparesis following cerebral infarction affecting left non-dominant side: Secondary | ICD-10-CM | POA: Diagnosis not present

## 2016-03-07 DIAGNOSIS — I69328 Other speech and language deficits following cerebral infarction: Secondary | ICD-10-CM | POA: Diagnosis not present

## 2016-03-07 DIAGNOSIS — Z9181 History of falling: Secondary | ICD-10-CM | POA: Diagnosis not present

## 2016-03-07 DIAGNOSIS — Z7982 Long term (current) use of aspirin: Secondary | ICD-10-CM | POA: Diagnosis not present

## 2016-03-07 DIAGNOSIS — I10 Essential (primary) hypertension: Secondary | ICD-10-CM | POA: Diagnosis not present

## 2016-03-07 DIAGNOSIS — E785 Hyperlipidemia, unspecified: Secondary | ICD-10-CM | POA: Diagnosis not present

## 2016-03-08 ENCOUNTER — Ambulatory Visit (INDEPENDENT_AMBULATORY_CARE_PROVIDER_SITE_OTHER): Payer: Medicare Other | Admitting: Interventional Cardiology

## 2016-03-08 ENCOUNTER — Encounter: Payer: Self-pay | Admitting: Interventional Cardiology

## 2016-03-08 VITALS — BP 102/70 | HR 60 | Ht 67.0 in | Wt 199.0 lb

## 2016-03-08 DIAGNOSIS — I639 Cerebral infarction, unspecified: Secondary | ICD-10-CM | POA: Diagnosis not present

## 2016-03-08 DIAGNOSIS — I63311 Cerebral infarction due to thrombosis of right middle cerebral artery: Secondary | ICD-10-CM | POA: Diagnosis not present

## 2016-03-08 DIAGNOSIS — E785 Hyperlipidemia, unspecified: Secondary | ICD-10-CM

## 2016-03-08 DIAGNOSIS — I10 Essential (primary) hypertension: Secondary | ICD-10-CM | POA: Diagnosis not present

## 2016-03-08 NOTE — Progress Notes (Signed)
Cardiology Office Note   Date:  03/08/2016   ID:  [name], DOB 14-Oct-1933, MRN [id number]  PCP:  Paulina Fusi, MD    No chief complaint on file. f/u CVA   Wt Readings from Last 3 Encounters:  03/08/16 199 lb (90.266 kg)  02/01/16 199 lb 11.8 oz (90.6 kg)  09/30/15 200 lb (90.719 kg)       History of Present Illness: [name] is a 82 y.o. female  who had a stroke in May 2017. She also suffered a fall and hit her head on the toilet. She had an ischemic stroke noted in the right MCA territory. She went to rehabilitation. She had an echocardiogram done at [facility]. This showed normal left ventricular function. We don't have any other information about the study.  She had a heart monitor placed to evaluate for atrial fibrillation as a possible cause of her stroke. She still has 1 more week on the monitor. She has been tolerating her aspirin and Plavix. She denies any chest discomfort or shortness of breath. Overall, she feels that she is doing well and as had a good recovery. She wants to go back to driving.    Past Medical History  Diagnosis Date  . Hypertension   . High cholesterol   . Stroke [facility]) 1990's    "mild", denies residual on 01/28/2014  . Pneumonia     "maybe in my teens"  . Hypothyroidism   . Anemia   . History of blood transfusion     "S/P knee OR"  . Arthritis     "was in my knees"  . Recurrent UTI (urinary tract infection)     Past Surgical History  Procedure Laterality Date  . Muscle biopsy Left 1990's    "knot biopsy from carrying mail bag for years"  . Bladder suspension  2012  . Splenic artery embolization  01/28/2014  . Joint replacement    . Total knee arthroplasty Bilateral 2012  . Vaginal hysterectomy    . Revison of arteriovenous fistula Right 01/31/2014    Procedure: REPAIR PSEUDOANEURYSM;  Surgeon: Nada Libman, MD;  Location: [facility] OR;  Service: Vascular;  Laterality: Right;  Repair of Femoral  Pseudoaneurysm.  . Femur im nail Right 03/23/2014    Procedure: INTRAMEDULLARY (IM) NAIL FEMORAL;  Surgeon: Shelda Pal, MD;  Location: [facility] ORS;  Service: Orthopedics;  Laterality: Right;  . Visceral angiogram N/A 01/28/2014    Procedure: VISCERAL ANGIOGRAM;  Surgeon: Fransisco Hertz, MD;  Location: [facility] CATH LAB;  Service: Cardiovascular;  Laterality: N/A;  . Embolization N/A 01/28/2014    Procedure: EMBOLIZATION;  Surgeon: Fransisco Hertz, MD;  Location: [facility] CATH LAB;  Service: Cardiovascular;  Laterality: N/A;  Splenic Artery     Current Outpatient Prescriptions  Medication Sig Dispense Refill  . amLODipine (NORVASC) 5 MG tablet Take 5 mg by mouth daily.    Marland Kitchen aspirin 325 MG tablet Take 1 tablet (325 mg total) by mouth daily.    . bethanechol (URECHOLINE) 50 MG tablet Take 50 mg by mouth 2 (two) times daily.    . Calcium Carbonate-Vitamin D (CALCIUM 500 + D PO) Take 500 mg by mouth 2 (two) times daily.    . cholecalciferol (VITAMIN D) 1000 UNITS tablet Take 2,000 Units by mouth daily.    . clonazePAM (KLONOPIN) 0.5 MG tablet Take 1 tablet (0.5 mg total) by mouth daily as needed for anxiety. For anxiety 30 tablet 0  .  clopidogrel (PLAVIX) 75 MG tablet Take 1 tablet (75 mg total) by mouth daily. 30 tablet 0  . Cranberry-Vitamin C-Vitamin E (CRANBERRY PLUS VITAMIN C) 140-100-3 MG-MG-UNIT CAPS Take 2,000 Units by mouth every morning.     . ezetimibe (ZETIA) 10 MG tablet Take 1 tablet (10 mg total) by mouth at bedtime. 30 tablet 0  . famotidine (PEPCID) 10 MG tablet Take 1 tablet (10 mg total) by mouth 2 (two) times daily. 60 tablet 0  . folic acid (FOLVITE) 400 MCG tablet Take 400 mcg by mouth daily.    Marland Kitchen labetalol (NORMODYNE) 200 MG tablet Take 1.5 tablets (300 mg total) by mouth 2 (two) times daily. 90 tablet 0  . lactose free nutrition (BOOST) LIQD Take 237 mLs by mouth 3 (three) times daily as needed (meal replacement).    Marland Kitchen levothyroxine (SYNTHROID, LEVOTHROID) 50 MCG tablet Take 50 mcg by mouth  daily before breakfast.    . lisinopril (PRINIVIL,ZESTRIL) 40 MG tablet Take 1 tablet (40 mg total) by mouth at bedtime. 30 tablet 0  . Omega-3 Fatty Acids (FISH OIL) 1000 MG CAPS Take 1,000 mg by mouth 2 (two) times daily.    . polyethylene glycol (MIRALAX / GLYCOLAX) packet Take 17 g by mouth daily as needed for mild constipation. 14 each 0  . pravastatin (PRAVACHOL) 20 MG tablet Take 1 tablet (20 mg total) by mouth daily. 30 tablet 0  . trimethoprim (TRIMPEX) 100 MG tablet Take 100 mg by mouth daily.      No current facility-administered medications for this visit.    Allergies:   Review of patient's allergies indicates no known allergies.    Social History:  The patient  reports that she has never smoked. She has never used smokeless tobacco. She reports that she does not drink alcohol or use illicit drugs.   Family History:  The patient's family history includes Hypertension in her son. There is no history of Heart attack.    ROS:  Please see the history of present illness.   Otherwise, review of systems are positive for recent neurologic deficits which are improving.   All other systems are reviewed and negative.    PHYSICAL EXAM: VS:  BP 102/70 mmHg  Pulse 60  Ht 5\' 7"  (1.702 m)  Wt 199 lb (90.266 kg)  BMI 31.16 kg/m2 , BMI Body mass index is 31.16 kg/(m^2). GEN: Well nourished, well developed, in no acute distress HEENT: normal Neck: no JVD, carotid bruits, or masses Cardiac: RRR; no murmurs, rubs, or gallops,no edema  Respiratory:  clear to auscultation bilaterally, normal work of breathing GI: soft, nontender, nondistended, + BS MS: no deformity or atrophy Skin: warm and dry, no rash Neuro:  Strength and sensation are intact Psych: euthymic mood, full affect   EKG:   The ekg ordered 01/19/16 demonstrates normal sinus rhythm, no ST segment changes   Recent Labs: 01/24/2016: ALT 64*; BUN 15; Hemoglobin 11.9*; Platelets 150; Potassium 4.2; Sodium 137 01/30/2016:  Creatinine, Ser 0.77   Lipid Panel    Component Value Date/Time   CHOL 163 01/20/2016 0605   TRIG 225* 01/20/2016 0605   HDL 23* 01/20/2016 0605   CHOLHDL 7.1 01/20/2016 0605   VLDL 45* 01/20/2016 0605   LDLCALC 95 01/20/2016 0605     Other studies Reviewed: Additional studies/ records that were reviewed today with results demonstrating: Echocardiogram from [facility] as noted above..   ASSESSMENT AND PLAN:  1. CVA: Await full echo records to evaluate for any structural heart  disease. Normal left ventricular function noted. Continue aspirin and Plavix. Heart monitor is pending.  2. Hypertension: Currently on lisinopril. Initially, systolic blood pressure was meant to be in the 1:30 to 150 range. Is now lower. She is back on amlodipine. No dizziness. 3. Hyperlipidemia: Continue pravastatin.  Healthy diet to help keep triglycerides down. 4. It does not appear that she has had a transesophageal echocardiogram. I will defer to neurology whether that would be required.  Will await results of echocardiogram from [facility].   Current medicines are reviewed at length with the patient today.  The patient concerns regarding her medicines were addressed.  The following changes have been made:  No change  Labs/ tests ordered today include:  No orders of the defined types were placed in this encounter.    Recommend 150 minutes/week of aerobic exercise Low fat, low carb, high fiber diet recommended  Disposition:   FU when necessary   Signed, Lance Muss, MD  03/08/2016 11:45 AM    [facility] [address] [place], [place], [place]  [address] Phone: [phone]; Fax: [phone]

## 2016-03-08 NOTE — Patient Instructions (Signed)
**Note De-identified  Obfuscation** Medication Instructions:  Same-no changes  Labwork: None  Testing/Procedures: None  Follow-Up: As needed     If you need a refill on your cardiac medications before your next appointment, please call your pharmacy.   

## 2016-03-09 DIAGNOSIS — E785 Hyperlipidemia, unspecified: Secondary | ICD-10-CM | POA: Diagnosis not present

## 2016-03-09 DIAGNOSIS — I69328 Other speech and language deficits following cerebral infarction: Secondary | ICD-10-CM | POA: Diagnosis not present

## 2016-03-09 DIAGNOSIS — I69354 Hemiplegia and hemiparesis following cerebral infarction affecting left non-dominant side: Secondary | ICD-10-CM | POA: Diagnosis not present

## 2016-03-09 DIAGNOSIS — I10 Essential (primary) hypertension: Secondary | ICD-10-CM | POA: Diagnosis not present

## 2016-03-09 DIAGNOSIS — Z7982 Long term (current) use of aspirin: Secondary | ICD-10-CM | POA: Diagnosis not present

## 2016-03-09 DIAGNOSIS — Z9181 History of falling: Secondary | ICD-10-CM | POA: Diagnosis not present

## 2016-03-14 DIAGNOSIS — Z7982 Long term (current) use of aspirin: Secondary | ICD-10-CM | POA: Diagnosis not present

## 2016-03-14 DIAGNOSIS — Z9181 History of falling: Secondary | ICD-10-CM | POA: Diagnosis not present

## 2016-03-14 DIAGNOSIS — E785 Hyperlipidemia, unspecified: Secondary | ICD-10-CM | POA: Diagnosis not present

## 2016-03-14 DIAGNOSIS — I10 Essential (primary) hypertension: Secondary | ICD-10-CM | POA: Diagnosis not present

## 2016-03-14 DIAGNOSIS — I69328 Other speech and language deficits following cerebral infarction: Secondary | ICD-10-CM | POA: Diagnosis not present

## 2016-03-14 DIAGNOSIS — I69354 Hemiplegia and hemiparesis following cerebral infarction affecting left non-dominant side: Secondary | ICD-10-CM | POA: Diagnosis not present

## 2016-03-15 DIAGNOSIS — I10 Essential (primary) hypertension: Secondary | ICD-10-CM | POA: Diagnosis not present

## 2016-03-15 DIAGNOSIS — I69354 Hemiplegia and hemiparesis following cerebral infarction affecting left non-dominant side: Secondary | ICD-10-CM | POA: Diagnosis not present

## 2016-03-15 DIAGNOSIS — Z7982 Long term (current) use of aspirin: Secondary | ICD-10-CM | POA: Diagnosis not present

## 2016-03-15 DIAGNOSIS — Z9181 History of falling: Secondary | ICD-10-CM | POA: Diagnosis not present

## 2016-03-15 DIAGNOSIS — I69328 Other speech and language deficits following cerebral infarction: Secondary | ICD-10-CM | POA: Diagnosis not present

## 2016-03-15 DIAGNOSIS — E785 Hyperlipidemia, unspecified: Secondary | ICD-10-CM | POA: Diagnosis not present

## 2016-03-19 ENCOUNTER — Ambulatory Visit (HOSPITAL_BASED_OUTPATIENT_CLINIC_OR_DEPARTMENT_OTHER): Payer: Medicare Other | Admitting: Physical Medicine & Rehabilitation

## 2016-03-19 ENCOUNTER — Encounter: Payer: Self-pay | Admitting: Physical Medicine & Rehabilitation

## 2016-03-19 ENCOUNTER — Encounter: Payer: Medicare Other | Attending: Physical Medicine & Rehabilitation

## 2016-03-19 VITALS — BP 119/72 | HR 79

## 2016-03-19 DIAGNOSIS — Z87440 Personal history of urinary (tract) infections: Secondary | ICD-10-CM | POA: Diagnosis not present

## 2016-03-19 DIAGNOSIS — I69328 Other speech and language deficits following cerebral infarction: Secondary | ICD-10-CM | POA: Diagnosis not present

## 2016-03-19 DIAGNOSIS — I69398 Other sequelae of cerebral infarction: Secondary | ICD-10-CM

## 2016-03-19 DIAGNOSIS — R531 Weakness: Secondary | ICD-10-CM | POA: Insufficient documentation

## 2016-03-19 DIAGNOSIS — G8190 Hemiplegia, unspecified affecting unspecified side: Secondary | ICD-10-CM

## 2016-03-19 DIAGNOSIS — I639 Cerebral infarction, unspecified: Secondary | ICD-10-CM

## 2016-03-19 DIAGNOSIS — I63511 Cerebral infarction due to unspecified occlusion or stenosis of right middle cerebral artery: Secondary | ICD-10-CM

## 2016-03-19 DIAGNOSIS — R269 Unspecified abnormalities of gait and mobility: Secondary | ICD-10-CM

## 2016-03-19 DIAGNOSIS — I10 Essential (primary) hypertension: Secondary | ICD-10-CM | POA: Insufficient documentation

## 2016-03-19 DIAGNOSIS — E7800 Pure hypercholesterolemia, unspecified: Secondary | ICD-10-CM | POA: Insufficient documentation

## 2016-03-19 DIAGNOSIS — M1990 Unspecified osteoarthritis, unspecified site: Secondary | ICD-10-CM | POA: Diagnosis not present

## 2016-03-19 NOTE — Progress Notes (Signed)
Subjective:    Patient ID: [name], female    DOB: [birth year], 82 y.o.   MRN: [id number] 82 y.o. RH-female with history of HTN, OA, recurrent UTIs,  CVA with left sided weakness with numbness and slurred speech at [facility] a week PTA with persistent left sided symptoms. She sustained a fall and struck her head on the toilet and was admitted for work up.   She had been off plavix for a week for unclear reasons--question discontinued at discharged v/s stopped taking it.   MRI brain showed mild local propagation of RIGHT MCA territory acute small infarcts and positive for moderate to severe stenosis  bilateral PCA P2 segments and L-VA distal segment and posterior R-MCA M2 occlusion 8mm beyond its orign.   Dr. Roda Shutters recommended ASA/plavix for 3 months followed by plavix. She is to follow up with [facility] heart care for 30 day event monitor to rule out A fib due to  M2 occlusion and avoid hypotension   HPI Patient has completed home health PT. She has started to ambulate with a cane and is able to go up and down her driveway which is 2/10 of a mile Still fits little weak on the left leg. 9 patient feels the numbness has gone out of her hand as well as left side of her face. Is using the left arm more normally now. Patient has a handicap shower but otherwise is independent with bathing and dressing. No falls at home  Pain Inventory Average Pain 0 Pain Right Now 0 My pain is no pain  In the last 24 hours, has pain interfered with the following? General activity 0 Relation with others 0 Enjoyment of life 0 What TIME of day is your pain at its worst? none Sleep (in general) Good  Pain is worse with: no pain Pain improves with: no pain Relief from Meds: no pain  Mobility use a cane  Function retired I need assistance with the following:  shopping Do you have any goals in this area?  yes  Neuro/Psych bladder control problems  Prior Studies Any changes since last visit?  no  Physicians  involved in your care Any changes since last visit?  no   Family History  Problem Relation Age of Onset  . Hypertension Son   . Heart attack Neg Hx    Social History   Social History  . Marital Status: Married    Spouse Name: N/A  . Number of Children: N/A  . Years of Education: N/A   Social History Main Topics  . Smoking status: Never Smoker   . Smokeless tobacco: Never Used  . Alcohol Use: No  . Drug Use: No  . Sexual Activity: No   Other Topics Concern  . None   Social History Narrative   Past Surgical History  Procedure Laterality Date  . Muscle biopsy Left 1990's    "knot biopsy from carrying mail bag for years"  . Bladder suspension  2012  . Splenic artery embolization  01/28/2014  . Joint replacement    . Total knee arthroplasty Bilateral 2012  . Vaginal hysterectomy    . Revison of arteriovenous fistula Right 01/31/2014    Procedure: REPAIR PSEUDOANEURYSM;  Surgeon: Nada Libman, MD;  Location: [facility] OR;  Service: Vascular;  Laterality: Right;  Repair of Femoral Pseudoaneurysm.  . Femur im nail Right 03/23/2014    Procedure: INTRAMEDULLARY (IM) NAIL FEMORAL;  Surgeon: Shelda Pal, MD;  Location: [facility] ORS;  Service: Orthopedics;  Laterality: Right;  . Visceral angiogram N/A 01/28/2014    Procedure: VISCERAL ANGIOGRAM;  Surgeon: Fransisco Hertz, MD;  Location: [facility] CATH LAB;  Service: Cardiovascular;  Laterality: N/A;  . Embolization N/A 01/28/2014    Procedure: EMBOLIZATION;  Surgeon: Fransisco Hertz, MD;  Location: [facility] CATH LAB;  Service: Cardiovascular;  Laterality: N/A;  Splenic Artery   Past Medical History  Diagnosis Date  . Hypertension   . High cholesterol   . Stroke [facility]) 1990's    "mild", denies residual on 01/28/2014  . Pneumonia     "maybe in my teens"  . Hypothyroidism   . Anemia   . History of blood transfusion     "S/P knee OR"  . Arthritis     "was in my knees"  . Recurrent UTI (urinary tract infection)    BP 119/72 mmHg  Pulse 79  SpO2  95%  Opioid Risk Score:   Fall Risk Score:  `1  Depression screen PHQ 2/9  Depression screen PHQ 2/9 02/13/2016  Decreased Interest 0  Down, Depressed, Hopeless 0  PHQ - 2 Score 0  Altered sleeping 0  Tired, decreased energy 3  Change in appetite 0  Feeling bad or failure about yourself  0  Trouble concentrating 0  Moving slowly or fidgety/restless 0  Suicidal thoughts 0  PHQ-9 Score 3  Difficult doing work/chores Somewhat difficult     Review of Systems  Constitutional: Negative.   HENT: Negative.   Eyes: Negative.   Respiratory: Negative.   Cardiovascular: Negative.   Gastrointestinal: Negative.   Endocrine: Negative.   Genitourinary: Negative.   Musculoskeletal: Negative.   Skin: Negative.   Allergic/Immunologic: Negative.   Neurological: Negative.   Hematological: Negative.   Psychiatric/Behavioral: Negative.        Objective:   Physical Exam  Constitutional: She is oriented to person, place, and time. She appears well-developed and well-nourished.  HENT:  Head: Normocephalic and atraumatic.  Eyes: Conjunctivae and EOM are normal. Pupils are equal, round, and reactive to light.  Neck: Normal range of motion.  Neurological: She is alert and oriented to person, place, and time.  Psychiatric: She has a normal mood and affect.  Nursing note and vitals reviewed.   Motor strength is 5/5 bilateral deltoid, biceps, triceps, grip 5/5 in the right hip flexor knee extensor ankle dorsal flexor plantar flexion 4/5 left hip flexor and extensor ankle dorsiflexor and plantar flexor Sensation intact to pinprick and light touch in bilateral upper and lower limbs as well as facial area Ambulates with a cane no evidence of toe drag or knee instability. Without a cane she exhibits good stability, widened base support. Mild antalgic gait favoring left lower extremity.  Mood and affect are appropriate, Visual fields are intact to confrontation testing        Assessment &  Plan:  1. Gait disorder secondary to CVA, she is not back to her baseline . We discussed referral to outpatient therapy however she feels that if she does her home exercise program supplied by the home health PT she should be able to get back to using no cane. In fact she states that in the house she does not use the cane only when she leaves the house. She would like to return to driving, I do not see a reason why she could not do this. We discussed graduated return to driving program as outlined below  Graduated return to driving instructions were provided. It is recommended that the patient  first drives with another licensed driver in an empty parking lot. If the patient does well with this, and they can drive on a quiet street with the licensed driver. If the patient does well with this they can drive on a busy street with a licensed driver. If the patient does well with this, the next time out they can go by himself. For the first month after resuming driving, I recommend no nighttime or Interstate driving.   2. Secondary stroke prevention follow-up with neurology, question is whether she would be maintained on Plavix alone. Scheduled appointment at the end of the month

## 2016-03-19 NOTE — Patient Instructions (Signed)
No rehabilitation follow up needed at the current time

## 2016-03-20 ENCOUNTER — Encounter: Payer: Self-pay | Admitting: Interventional Cardiology

## 2016-03-27 ENCOUNTER — Telehealth: Payer: Self-pay | Admitting: *Deleted

## 2016-03-27 NOTE — Telephone Encounter (Signed)
Per Dr Erlinda Hong, LVM for patient and informed her that her cardiac monitoring test did not show any irregular heart beat. Dr Erlinda Hong will continue with her current treatment plan. Left name, number for questions.

## 2016-03-28 ENCOUNTER — Encounter: Payer: Self-pay | Admitting: Interventional Cardiology

## 2016-04-05 ENCOUNTER — Encounter: Payer: Self-pay | Admitting: Nurse Practitioner

## 2016-04-05 ENCOUNTER — Ambulatory Visit (INDEPENDENT_AMBULATORY_CARE_PROVIDER_SITE_OTHER): Payer: Medicare Other | Admitting: Nurse Practitioner

## 2016-04-05 VITALS — BP 143/93 | HR 72 | Ht 67.0 in | Wt 202.0 lb

## 2016-04-05 DIAGNOSIS — I639 Cerebral infarction, unspecified: Secondary | ICD-10-CM | POA: Diagnosis not present

## 2016-04-05 DIAGNOSIS — I69398 Other sequelae of cerebral infarction: Secondary | ICD-10-CM

## 2016-04-05 DIAGNOSIS — E785 Hyperlipidemia, unspecified: Secondary | ICD-10-CM

## 2016-04-05 DIAGNOSIS — I10 Essential (primary) hypertension: Secondary | ICD-10-CM

## 2016-04-05 DIAGNOSIS — R269 Unspecified abnormalities of gait and mobility: Secondary | ICD-10-CM | POA: Diagnosis not present

## 2016-04-05 NOTE — Progress Notes (Signed)
[facility]  PATIENT: [name] DOB: 05/17/1934   REASON FOR VISIT: Hospital follow-up for stroke HISTORY FROM: Patient    HISTORY OF PRESENT ILLNESS:Patient is a 82 y.o. female with past medical history of hypertension, dyslipidemia, hypothyroidism who was just discharged from [facility] on 5/5 after workup for CVA, brought in for worsening left arm weakness and slurred speech on 01/19/16. . Further evaluation revealed extension of recent infarct in Right CR and SO infarcts in same area from 1 week ago, secondary to small vessel disease. MRA with diffuse cerebrovascular disease. Carotid Doppler no significant stenosis. 2-D echo 60-65% ejection fraction no source of embolus LDL 105. She was placed on dual antiplatelet therapy for 3 months of Plavix and aspirin 325. LDL 105 and Pravachol was added along with Zetia. She received inpatient physical therapy then discharged home for continued physical therapy and speech therapy which has concluded,  she continues to ambulate with cane. No falls. No further stroke or TIA symptoms since hospital discharge. She returns for reevaluation   REVIEW OF SYSTEMS: Full 14 system review of systems performed and notable only for those listed, all others are neg:  Constitutional: neg  Cardiovascular: neg Ear/Nose/Throat: neg  Skin: neg Eyes: neg Respiratory: neg Gastroitestinal: neg  Hematology/Lymphatic: neg  Endocrine: neg Musculoskeletal: Walking difficulty Allergy/Immunology: neg Neurological: Recent stroke Psychiatric: neg Sleep : neg   ALLERGIES: No Known Allergies  HOME MEDICATIONS: Outpatient Medications Prior to Visit  Medication Sig Dispense Refill  . amLODipine (NORVASC) 5 MG tablet Take 5 mg by mouth daily.    Marland Kitchen aspirin 325 MG tablet Take 1 tablet (325 mg total) by mouth daily.    . bethanechol (URECHOLINE) 50 MG tablet Take 50 mg by mouth 2 (two) times daily.    . Calcium Carbonate-Vitamin D  (CALCIUM 500 + D PO) Take 500 mg by mouth 2 (two) times daily.    . cholecalciferol (VITAMIN D) 1000 UNITS tablet Take 2,000 Units by mouth daily.    . clopidogrel (PLAVIX) 75 MG tablet Take 1 tablet (75 mg total) by mouth daily. 30 tablet 0  . Cranberry-Vitamin C-Vitamin E (CRANBERRY PLUS VITAMIN C) 140-100-3 MG-MG-UNIT CAPS Take 2,000 Units by mouth every morning.     . ezetimibe (ZETIA) 10 MG tablet Take 1 tablet (10 mg total) by mouth at bedtime. 30 tablet 0  . famotidine (PEPCID) 10 MG tablet Take 1 tablet (10 mg total) by mouth 2 (two) times daily. 60 tablet 0  . folic acid (FOLVITE) 400 MCG tablet Take 400 mcg by mouth daily.    Marland Kitchen labetalol (NORMODYNE) 200 MG tablet Take 1.5 tablets (300 mg total) by mouth 2 (two) times daily. 90 tablet 0  . lactose free nutrition (BOOST) LIQD Take 237 mLs by mouth 3 (three) times daily as needed (meal replacement).    Marland Kitchen levothyroxine (SYNTHROID, LEVOTHROID) 50 MCG tablet Take 50 mcg by mouth daily before breakfast.    . lisinopril (PRINIVIL,ZESTRIL) 40 MG tablet Take 1 tablet (40 mg total) by mouth at bedtime. 30 tablet 0  . Omega-3 Fatty Acids (FISH OIL) 1000 MG CAPS Take 1,000 mg by mouth 2 (two) times daily.    . polyethylene glycol (MIRALAX / GLYCOLAX) packet Take 17 g by mouth daily as needed for mild constipation. 14 each 0  . pravastatin (PRAVACHOL) 20 MG tablet Take 1 tablet (20 mg total) by mouth daily. 30 tablet 0  . trimethoprim (TRIMPEX) 100 MG tablet Take 100 mg by mouth daily.  No facility-administered medications prior to visit.     PAST MEDICAL HISTORY: Past Medical History:  Diagnosis Date  . Anemia   . Arthritis    "was in my knees"  . High cholesterol   . History of blood transfusion    "S/P knee OR"  . Hypertension   . Hypothyroidism   . Pneumonia    "maybe in my teens"  . Recurrent UTI (urinary tract infection)   . Stroke [facility]) 1990's   "mild", denies residual on 01/28/2014    PAST SURGICAL HISTORY: Past Surgical  History:  Procedure Laterality Date  . BLADDER SUSPENSION  2012  . EMBOLIZATION N/A 01/28/2014   Procedure: EMBOLIZATION;  Surgeon: Fransisco Hertz, MD;  Location: [facility] CATH LAB;  Service: Cardiovascular;  Laterality: N/A;  Splenic Artery  . FEMUR IM NAIL Right 03/23/2014   Procedure: INTRAMEDULLARY (IM) NAIL FEMORAL;  Surgeon: Shelda Pal, MD;  Location: [facility] ORS;  Service: Orthopedics;  Laterality: Right;  . JOINT REPLACEMENT    . MUSCLE BIOPSY Left 1990's   "knot biopsy from carrying mail bag for years"  . REVISON OF ARTERIOVENOUS FISTULA Right 01/31/2014   Procedure: REPAIR PSEUDOANEURYSM;  Surgeon: Nada Libman, MD;  [facility];  Service: Vascular;  Laterality: Right;  Repair of Femoral Pseudoaneurysm.  . SPLENIC ARTERY EMBOLIZATION  01/28/2014  . TOTAL KNEE ARTHROPLASTY Bilateral 2012  . VAGINAL HYSTERECTOMY    . VISCERAL ANGIOGRAM N/A 01/28/2014   Procedure: VISCERAL ANGIOGRAM;  Surgeon: Fransisco Hertz, MD;  Location: [facility] CATH LAB;  Service: Cardiovascular;  Laterality: N/A;    FAMILY HISTORY: Family History  Problem Relation Age of Onset  . Hypertension Son   . Heart attack Neg Hx     SOCIAL HISTORY: Social History   Social History  . Marital status: Widowed    Spouse name: N/A  . Number of children: N/A  . Years of education: N/A   Occupational History  . Not on file.   Social History Main Topics  . Smoking status: Never Smoker  . Smokeless tobacco: Never Used  . Alcohol use No  . Drug use: No  . Sexual activity: No   Other Topics Concern  . Not on file   Social History Narrative  . No narrative on file     PHYSICAL EXAM  Vitals:   04/05/16 0945  BP: (!) 143/93  Pulse: 72  Weight: 202 lb (91.6 kg)  Height: 5\' 7"  (1.702 m)   Body mass index is 31.64 kg/m.  Generalized: Well developed, in no acute distress  Head: normocephalic and atraumatic,. Oropharynx benign  Neck: Supple, no carotid bruits  Cardiac: Regular rate rhythm, no murmur    Musculoskeletal: No deformity   Neurological examination   Mentation: Alert oriented to time, place, history taking. Attention span and concentration appropriate. Recent and remote memory intact.  Follows all commands speech and language fluent.   Cranial nerve II-XII: Pupils were equal round reactive to light extraocular movements were full, visual field were full on confrontational test. Facial sensation and strength were normal. hearing was intact to finger rubbing bilaterally. Uvula tongue midline. head turning and shoulder shrug were normal and symmetric.Tongue protrusion into cheek strength was normal. Motor: normal bulk and tone, full strength in the BUE, BLE, Except left upper extremity 4 out of 5 proximal and 4 out of 5 distally .  Sensory: normal and symmetric to light touch, pinprick, and  Vibration, In the upper and lower extremities Coordination: finger-nose-finger, heel-to-shin  bilaterally, no dysmetria Reflexes: 1+ upper lower and symmetric plantar responses were flexor bilaterally. Gait and Station: Rising up from seated position without assistance, normal stance,  moderate stride, good arm swing, smooth turning, ambulates with single-point cane Romberg negative   DIAGNOSTIC DATA (LABS, IMAGING, TESTING) - I reviewed patient records, labs, notes, testing and imaging myself where available.  Lab Results  Component Value Date   WBC 5.9 01/24/2016   HGB 11.9 (L) 01/24/2016   HCT 35.1 (L) 01/24/2016   MCV 91.4 01/24/2016   PLT 150 01/24/2016      Component Value Date/Time   NA 137 01/24/2016 0732   K 4.2 01/24/2016 0732   CL 102 01/24/2016 0732   CO2 22 01/24/2016 0732   GLUCOSE 93 01/24/2016 0732   BUN 15 01/24/2016 0732   CREATININE 0.77 01/30/2016 0601   CALCIUM 10.1 01/24/2016 0732   PROT 6.1 (L) 01/24/2016 0732   ALBUMIN 3.8 01/24/2016 0732   AST 66 (H) 01/24/2016 0732   ALT 64 (H) 01/24/2016 0732   ALKPHOS 63 01/24/2016 0732   BILITOT 1.2 01/24/2016 0732    GFRNONAA >60 01/30/2016 0601   GFRAA >60 01/30/2016 0601   Lab Results  Component Value Date   CHOL 163 01/20/2016   HDL 23 (L) 01/20/2016   LDLCALC 95 01/20/2016   TRIG 225 (H) 01/20/2016   CHOLHDL 7.1 01/20/2016   Lab Results  Component Value Date   HGBA1C 5.5 01/20/2016    ASSESSMENT AND PLAN  82 y.o. year old female  has a past medical history of Anemia; Arthritis; High cholesterol; History of blood transfusion; Hypertension; Hypothyroidism; Pneumonia; Recurrent UTI (urinary tract infection); and Stroke (HCC) (1990's). here To follow-up for hospital admission for stroke in May 2017. Recent infarct in Right CR and SO infarcts in same area from 1 week ago, secondary to small vessel disease. MRA with diffuse cerebrovascular disease. Carotid Doppler no significant stenosis. 2-D echo 60-65% ejection fraction no source of embolus LDL 105. She was placed on dual antiplatelet therapy for 3 months of Plavix and aspirin 325. LDL 105 and Pravachol was added along with Zetia   PLAN  Stressed the importance of management of risk factors to prevent further stroke Continue DAPT of Plavix and ASA for secondary stroke prevention on August 11,2017 may stop ASA and continue Plavix Maintain strict control of hypertension with blood pressure goal 130 -150 systolic  today's [phone],  continue antihypertensive medications Hemoglobin A1c below 6.5 followed by primary care most recent hemoglobin A1c 5.3 Cholesterol with LDL cholesterol less than 70, followed by primary care,  most recent105,  continue statin drugs Zetia and Pravachol Exercise by walking, slowly increase , eat healthy diet with whole grains,  fresh fruits and vegetables Continue home exercise program Discussed risk for recurrent stroke/ TIA and answered additional questions This was a prolonged visit requiring 30 minutes with extensive review of history, hospital chart, counseling and answering questions Follow-up in 3 months Vst time  30 min Nilda Riggs, [facility], [facility], APRN  [facility] [address], [address] [place], [place] [address] [phone]

## 2016-04-05 NOTE — Patient Instructions (Addendum)
Stressed the importance of management of risk factors to prevent further stroke Continue Plavix and ASA for secondary stroke prevention on August 11,2017 may stop ASA and continue Plavix Maintain strict control of hypertension with blood pressure goal [id number] systolic  today's [phone],  continue antihypertensive medications Hemoglobin A1c below 6.5 followed by primary care most recent hemoglobin A1c 5.3 Cholesterol with LDL cholesterol less than 70, followed by primary care,  most recent105,  continue statin drugs Zetia and Pravachol Exercise by walking, slowly increase , eat healthy diet with whole grains,  fresh fruits and vegetables Continue home exercise program Discussed risk for recurrent stroke/ TIA and answered additional questions 30 day event monitoring was negative for abnormal heart rate Follow-up in 3 months

## 2016-04-06 DIAGNOSIS — Z6831 Body mass index (BMI) 31.0-31.9, adult: Secondary | ICD-10-CM | POA: Diagnosis not present

## 2016-04-06 DIAGNOSIS — R05 Cough: Secondary | ICD-10-CM | POA: Diagnosis not present

## 2016-04-06 DIAGNOSIS — J449 Chronic obstructive pulmonary disease, unspecified: Secondary | ICD-10-CM | POA: Diagnosis not present

## 2016-04-06 NOTE — Progress Notes (Signed)
I reviewed above note and agree with the assessment and plan.  Rosalin Hawking, MD PhD Stroke Neurology 04/06/2016 6:58 PM

## 2016-04-24 DIAGNOSIS — H25813 Combined forms of age-related cataract, bilateral: Secondary | ICD-10-CM | POA: Diagnosis not present

## 2016-05-01 DIAGNOSIS — I10 Essential (primary) hypertension: Secondary | ICD-10-CM | POA: Diagnosis not present

## 2016-05-01 DIAGNOSIS — Z6831 Body mass index (BMI) 31.0-31.9, adult: Secondary | ICD-10-CM | POA: Diagnosis not present

## 2016-05-01 DIAGNOSIS — E559 Vitamin D deficiency, unspecified: Secondary | ICD-10-CM | POA: Diagnosis not present

## 2016-05-01 DIAGNOSIS — Z1231 Encounter for screening mammogram for malignant neoplasm of breast: Secondary | ICD-10-CM | POA: Diagnosis not present

## 2016-05-01 DIAGNOSIS — I63511 Cerebral infarction due to unspecified occlusion or stenosis of right middle cerebral artery: Secondary | ICD-10-CM | POA: Diagnosis not present

## 2016-05-01 DIAGNOSIS — E785 Hyperlipidemia, unspecified: Secondary | ICD-10-CM | POA: Diagnosis not present

## 2016-05-01 DIAGNOSIS — E039 Hypothyroidism, unspecified: Secondary | ICD-10-CM | POA: Diagnosis not present

## 2016-05-17 DIAGNOSIS — N3090 Cystitis, unspecified without hematuria: Secondary | ICD-10-CM | POA: Diagnosis not present

## 2016-05-18 DIAGNOSIS — N390 Urinary tract infection, site not specified: Secondary | ICD-10-CM | POA: Diagnosis not present

## 2016-05-29 DIAGNOSIS — Z1231 Encounter for screening mammogram for malignant neoplasm of breast: Secondary | ICD-10-CM | POA: Diagnosis not present

## 2016-05-30 DIAGNOSIS — N319 Neuromuscular dysfunction of bladder, unspecified: Secondary | ICD-10-CM | POA: Diagnosis not present

## 2016-05-30 DIAGNOSIS — N390 Urinary tract infection, site not specified: Secondary | ICD-10-CM | POA: Diagnosis not present

## 2016-05-30 DIAGNOSIS — R339 Retention of urine, unspecified: Secondary | ICD-10-CM | POA: Diagnosis not present

## 2016-07-11 ENCOUNTER — Encounter: Payer: Self-pay | Admitting: Nurse Practitioner

## 2016-07-11 ENCOUNTER — Ambulatory Visit (INDEPENDENT_AMBULATORY_CARE_PROVIDER_SITE_OTHER): Payer: Medicare Other | Admitting: Nurse Practitioner

## 2016-07-11 VITALS — BP 160/90 | HR 72 | Ht 67.0 in | Wt 202.4 lb

## 2016-07-11 DIAGNOSIS — I63511 Cerebral infarction due to unspecified occlusion or stenosis of right middle cerebral artery: Secondary | ICD-10-CM

## 2016-07-11 DIAGNOSIS — I10 Essential (primary) hypertension: Secondary | ICD-10-CM

## 2016-07-11 DIAGNOSIS — E785 Hyperlipidemia, unspecified: Secondary | ICD-10-CM

## 2016-07-11 DIAGNOSIS — I639 Cerebral infarction, unspecified: Secondary | ICD-10-CM

## 2016-07-11 NOTE — Patient Instructions (Addendum)
Continue  Plavix  for secondary stroke prevention  Maintain strict control of hypertension with blood pressure goal [id number] systolic  today's reading 160/90  continue antihypertensive medication Cholesterol with LDL cholesterol less than 70, followed by primary care,    continue statin drugs Zetia and Pravachol Exercise by walking, slowly increase , eat healthy diet with whole grains,  fresh fruits and vegetables Continue home exercise program Follow-up in 6 months

## 2016-07-11 NOTE — Progress Notes (Signed)
I have read the note, and I agree with the clinical assessment and plan.  [place] A. Lanee Chain, MD, PhD Certified in Neurology, Clinical Neurophysiology, Sleep Medicine, Pain Medicine and Neuroimaging  [facility] [address], [address] [place], [place] [address] [phone]  

## 2016-07-11 NOTE — Progress Notes (Signed)
[facility]  PATIENT: [name] DOB: 24-Aug-1934   REASON FOR VISIT:  follow-up for stroke HISTORY FROM: Patient    HISTORY OF PRESENT ILLNESS:UPDATE 07/11/2016 [name], 82 year old female returns for follow-up. She has a history of right CR an episode infarcts secondary to small vessel disease. MRA with diffuse cerebrovascular disease. She is currently on Plavix with minimal bruising and bleeding. She has not had recurrent stroke or TIA symptoms. She continues to walk for exercise and also rides stationary bike. She ambulates with a cane. She denies any falls. Blood pressure was mildly elevated in the office today however she just took her blood pressure medication about 5 minutes before the visit. She remains onset and Pravachol. Labs are followed by her primary care She returns for reevaluation  HISTORY 04/05/16 CMPatient is a 82 y.o. female with past medical history of hypertension, dyslipidemia, hypothyroidism who was just discharged from [facility] on 5/5 after workup for CVA, brought in for worsening left arm weakness and slurred speech on 01/19/16. . Further evaluation revealed extension of recent infarct in Right CR and SO infarcts in same area from 1 week ago, secondary to small vessel disease. MRA with diffuse cerebrovascular disease. Carotid Doppler no significant stenosis. 2-D echo 60-65% ejection fraction no source of embolus LDL 105. She was placed on dual antiplatelet therapy for 3 months of Plavix and aspirin 325. LDL 105 and Pravachol was added along with Zetia. She received inpatient physical therapy then discharged home for continued physical therapy and speech therapy which has concluded,  she continues to ambulate with cane. No falls. No further stroke or TIA symptoms since hospital discharge. She returns for reevaluation   REVIEW OF SYSTEMS: Full 14 system review of systems performed and notable only for those listed, all others are neg:    Constitutional: neg  Cardiovascular: neg Ear/Nose/Throat: neg  Skin: neg Eyes: neg Respiratory: neg Gastroitestinal: neg  Hematology/Lymphatic: neg  Endocrine: neg Musculoskeletal:  Allergy/Immunology: neg Neurological: History of  stroke Psychiatric: neg Sleep : neg   ALLERGIES: No Known Allergies  HOME MEDICATIONS: Outpatient Medications Prior to Visit  Medication Sig Dispense Refill  . bethanechol (URECHOLINE) 50 MG tablet Take 50 mg by mouth 2 (two) times daily.    . Calcium Carbonate-Vitamin D (CALCIUM 500 + D PO) Take 500 mg by mouth 2 (two) times daily.    . cholecalciferol (VITAMIN D) 1000 UNITS tablet Take 2,000 Units by mouth daily.    . clopidogrel (PLAVIX) 75 MG tablet Take 1 tablet (75 mg total) by mouth daily. 30 tablet 0  . Cranberry-Vitamin C-Vitamin E (CRANBERRY PLUS VITAMIN C) 140-100-3 MG-MG-UNIT CAPS Take 2,000 Units by mouth every morning.     . ezetimibe (ZETIA) 10 MG tablet Take 1 tablet (10 mg total) by mouth at bedtime. 30 tablet 0  . famotidine (PEPCID) 10 MG tablet Take 1 tablet (10 mg total) by mouth 2 (two) times daily. (Patient taking differently: Take 10 mg by mouth 2 (two) times daily as needed. ) 60 tablet 0  . folic acid (FOLVITE) 400 MCG tablet Take 400 mcg by mouth daily.    Marland Kitchen labetalol (NORMODYNE) 200 MG tablet Take 1.5 tablets (300 mg total) by mouth 2 (two) times daily. 90 tablet 0  . lactose free nutrition (BOOST) LIQD Take 237 mLs by mouth 3 (three) times daily as needed (meal replacement).    Marland Kitchen lisinopril (PRINIVIL,ZESTRIL) 40 MG tablet Take 1 tablet (40 mg total) by mouth at bedtime. 30  tablet 0  . Omega-3 Fatty Acids (FISH OIL) 1000 MG CAPS Take 1,000 mg by mouth 2 (two) times daily.    . pravastatin (PRAVACHOL) 20 MG tablet Take 1 tablet (20 mg total) by mouth daily. 30 tablet 0  . amLODipine (NORVASC) 5 MG tablet Take 5 mg by mouth daily.    Marland Kitchen aspirin 325 MG tablet Take 1 tablet (325 mg total) by mouth daily. (Patient not taking:  Reported on 07/11/2016)    . clonazePAM (KLONOPIN) 0.5 MG tablet TK 1 T PO D PRN  0  . levothyroxine (SYNTHROID, LEVOTHROID) 50 MCG tablet Take 50 mcg by mouth daily before breakfast.    . polyethylene glycol (MIRALAX / GLYCOLAX) packet Take 17 g by mouth daily as needed for mild constipation. (Patient not taking: Reported on 07/11/2016) 14 each 0  . trimethoprim (TRIMPEX) 100 MG tablet Take 100 mg by mouth daily.      No facility-administered medications prior to visit.     PAST MEDICAL HISTORY: Past Medical History:  Diagnosis Date  . Anemia   . Arthritis    "was in my knees"  . High cholesterol   . History of blood transfusion    "S/P knee OR"  . Hypertension   . Hypothyroidism   . Pneumonia    "maybe in my teens"  . Recurrent UTI (urinary tract infection)   . Stroke [facility]) 1990's   "mild", denies residual on 01/28/2014    PAST SURGICAL HISTORY: Past Surgical History:  Procedure Laterality Date  . BLADDER SUSPENSION  2012  . EMBOLIZATION N/A 01/28/2014   Procedure: EMBOLIZATION;  Surgeon: Fransisco Hertz, MD;  Location: [facility] CATH LAB;  Service: Cardiovascular;  Laterality: N/A;  Splenic Artery  . FEMUR IM NAIL Right 03/23/2014   Procedure: INTRAMEDULLARY (IM) NAIL FEMORAL;  Surgeon: Shelda Pal, MD;  Location: [facility] ORS;  Service: Orthopedics;  Laterality: Right;  . JOINT REPLACEMENT    . MUSCLE BIOPSY Left 1990's   "knot biopsy from carrying mail bag for years"  . REVISON OF ARTERIOVENOUS FISTULA Right 01/31/2014   Procedure: REPAIR PSEUDOANEURYSM;  Surgeon: Nada Libman, MD;  Location: [facility] OR;  Service: Vascular;  Laterality: Right;  Repair of Femoral Pseudoaneurysm.  . SPLENIC ARTERY EMBOLIZATION  01/28/2014  . TOTAL KNEE ARTHROPLASTY Bilateral 2012  . VAGINAL HYSTERECTOMY    . VISCERAL ANGIOGRAM N/A 01/28/2014   Procedure: VISCERAL ANGIOGRAM;  Surgeon: Fransisco Hertz, MD;  Location: [facility] CATH LAB;  Service: Cardiovascular;  Laterality: N/A;    FAMILY HISTORY: Family History    Problem Relation Age of Onset  . Hypertension Son   . Heart attack Neg Hx     SOCIAL HISTORY: Social History   Social History  . Marital status: Widowed    Spouse name: N/A  . Number of children: N/A  . Years of education: N/A   Occupational History  . Not on file.   Social History Main Topics  . Smoking status: Never Smoker  . Smokeless tobacco: Never Used  . Alcohol use No  . Drug use: No  . Sexual activity: No   Other Topics Concern  . Not on file   Social History Narrative  . No narrative on file     PHYSICAL EXAM  Vitals:   07/11/16 0956  BP: (!) 160/90  Pulse: 72  Weight: 202 lb 6.4 oz (91.8 kg)  Height: 5\' 7"  (1.702 m)   Body mass index is 31.7 kg/m.  Generalized: Well developed, in  no acute distress  Head: normocephalic and atraumatic,. Oropharynx benign  Neck: Supple, no carotid bruits  Cardiac: Regular rate rhythm, no murmur  Musculoskeletal: No deformity   Neurological examination   Mentation: Alert oriented to time, place, history taking. Attention span and concentration appropriate. Recent and remote memory intact.  Follows all commands speech and language fluent.   Cranial nerve II-XII: Pupils were equal round reactive to light extraocular movements were full, visual field were full on confrontational test. Facial sensation and strength were normal. hearing was intact to finger rubbing bilaterally. Uvula tongue midline. head turning and shoulder shrug were normal and symmetric.Tongue protrusion into cheek strength was normal. Motor: normal bulk and tone, full strength in the BUE, BLE,  Sensory: normal and symmetric to light touch, pinprick, and  Vibration, In the upper and lower extremities Coordination: finger-nose-finger, heel-to-shin bilaterally, no dysmetria Reflexes: 1+ upper lower and symmetric plantar responses were flexor bilaterally. Gait and Station: Rising up from seated position without assistance, normal stance,  moderate stride,  good arm swing, smooth turning, ambulates with single-point cane Romberg negative   DIAGNOSTIC DATA (LABS, IMAGING, TESTING) - I reviewed patient records, labs, notes, testing and imaging myself where available.  Lab Results  Component Value Date   WBC 5.9 01/24/2016   HGB 11.9 (L) 01/24/2016   HCT 35.1 (L) 01/24/2016   MCV 91.4 01/24/2016   PLT 150 01/24/2016      Component Value Date/Time   NA 137 01/24/2016 0732   K 4.2 01/24/2016 0732   CL 102 01/24/2016 0732   CO2 22 01/24/2016 0732   GLUCOSE 93 01/24/2016 0732   BUN 15 01/24/2016 0732   CREATININE 0.77 01/30/2016 0601   CALCIUM 10.1 01/24/2016 0732   PROT 6.1 (L) 01/24/2016 0732   ALBUMIN 3.8 01/24/2016 0732   AST 66 (H) 01/24/2016 0732   ALT 64 (H) 01/24/2016 0732   ALKPHOS 63 01/24/2016 0732   BILITOT 1.2 01/24/2016 0732   GFRNONAA >60 01/30/2016 0601   GFRAA >60 01/30/2016 0601   Lab Results  Component Value Date   CHOL 163 01/20/2016   HDL 23 (L) 01/20/2016   LDLCALC 95 01/20/2016   TRIG 225 (H) 01/20/2016   CHOLHDL 7.1 01/20/2016   Lab Results  Component Value Date   HGBA1C 5.5 01/20/2016    ASSESSMENT AND PLAN  82 y.o. year old female  has a past medical history of Anemia; Arthritis; High cholesterol; History of blood transfusion; Hypertension; Hypothyroidism; Pneumonia; Recurrent UTI (urinary tract infection); and Stroke (HCC) (1990's). here To follow-up for hospital admission for stroke in May 2017. Recent infarct in Right CR and SO infarcts in same area from 1 week ago, secondary to small vessel disease. MRA with diffuse cerebrovascular disease. Carotid Doppler no significant stenosis. 2-D echo 60-65% ejection fraction no source of embolus LDL 105.The patient is a current patient of Dr. Roda Shutters who is out of the office this am. This note is sent to the work in doctor.      PLAN  Stressed the importance of management of risk factors to prevent further stroke Continue  Plavix  for secondary stroke prevention   Maintain strict control of hypertension with blood pressure goal 130 -150 systolic  today's reading 160/90  continue antihypertensive medication Cholesterol with LDL cholesterol less than 70, followed by primary care,    continue statin drugs Zetia and Pravachol Exercise by walking, slowly increase , eat healthy diet with whole grains,  fresh fruits and vegetables Continue home exercise  program Follow-up in 6 months if stable at that time will dismiss [name], [facility], [facility], APRN  [facility] [address] [place], [place] [address] [phone]

## 2016-08-01 DIAGNOSIS — E785 Hyperlipidemia, unspecified: Secondary | ICD-10-CM | POA: Diagnosis not present

## 2016-08-01 DIAGNOSIS — Z23 Encounter for immunization: Secondary | ICD-10-CM | POA: Diagnosis not present

## 2016-08-01 DIAGNOSIS — E039 Hypothyroidism, unspecified: Secondary | ICD-10-CM | POA: Diagnosis not present

## 2016-08-01 DIAGNOSIS — Z6831 Body mass index (BMI) 31.0-31.9, adult: Secondary | ICD-10-CM | POA: Diagnosis not present

## 2016-08-01 DIAGNOSIS — N390 Urinary tract infection, site not specified: Secondary | ICD-10-CM | POA: Diagnosis not present

## 2016-08-01 DIAGNOSIS — I63511 Cerebral infarction due to unspecified occlusion or stenosis of right middle cerebral artery: Secondary | ICD-10-CM | POA: Diagnosis not present

## 2016-08-01 DIAGNOSIS — F419 Anxiety disorder, unspecified: Secondary | ICD-10-CM | POA: Diagnosis not present

## 2016-08-01 DIAGNOSIS — E559 Vitamin D deficiency, unspecified: Secondary | ICD-10-CM | POA: Diagnosis not present

## 2016-08-01 DIAGNOSIS — I10 Essential (primary) hypertension: Secondary | ICD-10-CM | POA: Diagnosis not present

## 2016-08-20 DIAGNOSIS — N390 Urinary tract infection, site not specified: Secondary | ICD-10-CM | POA: Diagnosis not present

## 2016-08-23 DIAGNOSIS — N390 Urinary tract infection, site not specified: Secondary | ICD-10-CM | POA: Diagnosis not present

## 2016-08-24 DIAGNOSIS — N390 Urinary tract infection, site not specified: Secondary | ICD-10-CM | POA: Diagnosis not present

## 2016-08-27 DIAGNOSIS — N390 Urinary tract infection, site not specified: Secondary | ICD-10-CM | POA: Diagnosis not present

## 2016-08-28 DIAGNOSIS — N390 Urinary tract infection, site not specified: Secondary | ICD-10-CM | POA: Diagnosis not present

## 2016-08-29 DIAGNOSIS — N390 Urinary tract infection, site not specified: Secondary | ICD-10-CM | POA: Diagnosis not present

## 2016-08-29 DIAGNOSIS — R339 Retention of urine, unspecified: Secondary | ICD-10-CM | POA: Diagnosis not present

## 2016-09-05 DIAGNOSIS — R339 Retention of urine, unspecified: Secondary | ICD-10-CM | POA: Diagnosis not present

## 2016-09-05 DIAGNOSIS — N390 Urinary tract infection, site not specified: Secondary | ICD-10-CM | POA: Diagnosis not present

## 2016-09-19 DIAGNOSIS — N390 Urinary tract infection, site not specified: Secondary | ICD-10-CM | POA: Diagnosis not present

## 2016-09-19 DIAGNOSIS — R339 Retention of urine, unspecified: Secondary | ICD-10-CM | POA: Diagnosis not present

## 2016-10-05 ENCOUNTER — Encounter (HOSPITAL_COMMUNITY): Payer: Medicare Other

## 2016-10-05 ENCOUNTER — Ambulatory Visit: Payer: Medicare Other | Admitting: Family

## 2016-10-17 DIAGNOSIS — N319 Neuromuscular dysfunction of bladder, unspecified: Secondary | ICD-10-CM | POA: Diagnosis not present

## 2016-10-17 DIAGNOSIS — R339 Retention of urine, unspecified: Secondary | ICD-10-CM | POA: Diagnosis not present

## 2016-10-17 DIAGNOSIS — N390 Urinary tract infection, site not specified: Secondary | ICD-10-CM | POA: Diagnosis not present

## 2016-11-02 ENCOUNTER — Encounter: Payer: Self-pay | Admitting: Family

## 2016-11-06 DIAGNOSIS — I63511 Cerebral infarction due to unspecified occlusion or stenosis of right middle cerebral artery: Secondary | ICD-10-CM | POA: Diagnosis not present

## 2016-11-06 DIAGNOSIS — R7301 Impaired fasting glucose: Secondary | ICD-10-CM | POA: Diagnosis not present

## 2016-11-06 DIAGNOSIS — M8589 Other specified disorders of bone density and structure, multiple sites: Secondary | ICD-10-CM | POA: Diagnosis not present

## 2016-11-06 DIAGNOSIS — E039 Hypothyroidism, unspecified: Secondary | ICD-10-CM | POA: Diagnosis not present

## 2016-11-06 DIAGNOSIS — E785 Hyperlipidemia, unspecified: Secondary | ICD-10-CM | POA: Diagnosis not present

## 2016-11-06 DIAGNOSIS — Z6832 Body mass index (BMI) 32.0-32.9, adult: Secondary | ICD-10-CM | POA: Diagnosis not present

## 2016-11-06 DIAGNOSIS — M810 Age-related osteoporosis without current pathological fracture: Secondary | ICD-10-CM | POA: Diagnosis not present

## 2016-11-06 DIAGNOSIS — E559 Vitamin D deficiency, unspecified: Secondary | ICD-10-CM | POA: Diagnosis not present

## 2016-11-06 DIAGNOSIS — I10 Essential (primary) hypertension: Secondary | ICD-10-CM | POA: Diagnosis not present

## 2016-11-09 ENCOUNTER — Encounter: Payer: Self-pay | Admitting: Family

## 2016-11-09 ENCOUNTER — Ambulatory Visit (HOSPITAL_COMMUNITY)
Admission: RE | Admit: 2016-11-09 | Discharge: 2016-11-09 | Disposition: A | Discharge status: Home / Self Care | Payer: Medicare Other | Source: Ambulatory Visit | Attending: Family | Admitting: Family

## 2016-11-09 ENCOUNTER — Ambulatory Visit (INDEPENDENT_AMBULATORY_CARE_PROVIDER_SITE_OTHER): Payer: Medicare Other | Admitting: Family

## 2016-11-09 ENCOUNTER — Telehealth: Payer: Self-pay | Admitting: Nurse Practitioner

## 2016-11-09 VITALS — BP 161/94 | HR 63 | Temp 97.0°F | Resp 18 | Ht 67.0 in | Wt 200.0 lb

## 2016-11-09 DIAGNOSIS — I724 Aneurysm of artery of lower extremity: Secondary | ICD-10-CM | POA: Diagnosis not present

## 2016-11-09 DIAGNOSIS — R0989 Other specified symptoms and signs involving the circulatory and respiratory systems: Secondary | ICD-10-CM

## 2016-11-09 DIAGNOSIS — IMO0001 Reserved for inherently not codable concepts without codable children: Secondary | ICD-10-CM

## 2016-11-09 DIAGNOSIS — I722 Aneurysm of renal artery: Secondary | ICD-10-CM | POA: Diagnosis not present

## 2016-11-09 DIAGNOSIS — Z8673 Personal history of transient ischemic attack (TIA), and cerebral infarction without residual deficits: Secondary | ICD-10-CM | POA: Diagnosis not present

## 2016-11-09 DIAGNOSIS — I728 Aneurysm of other specified arteries: Secondary | ICD-10-CM | POA: Diagnosis not present

## 2016-11-09 DIAGNOSIS — R030 Elevated blood-pressure reading, without diagnosis of hypertension: Secondary | ICD-10-CM | POA: Diagnosis not present

## 2016-11-09 NOTE — Telephone Encounter (Signed)
Received labs drawn on 11/06/2016. CMP within normal limits Lipid panel total cholesterol 119 triglycerides 158 HDL 29 LDL 58 Hemoglobin A1c 5.5

## 2016-11-09 NOTE — Patient Instructions (Addendum)
Stroke Prevention Some medical conditions and behaviors are associated with an increased chance of having a stroke. You may prevent a stroke by making healthy choices and managing medical conditions. How can I reduce my risk of having a stroke?  Stay physically active. Get at least 30 minutes of activity on most or all days.  Do not smoke. It may also be helpful to avoid exposure to secondhand smoke.  Limit alcohol use. Moderate alcohol use is considered to be:  No more than 2 drinks per day for men.  No more than 1 drink per day for nonpregnant women.  Eat healthy foods. This involves:  Eating 5 or more servings of fruits and vegetables a day.  Making dietary changes that address high blood pressure (hypertension), high cholesterol, diabetes, or obesity.  Manage your cholesterol levels.  Making food choices that are high in fiber and low in saturated fat, trans fat, and cholesterol may control cholesterol levels.  Take any prescribed medicines to control cholesterol as directed by your health care provider.  Manage your diabetes.  Controlling your carbohydrate and sugar intake is recommended to manage diabetes.  Take any prescribed medicines to control diabetes as directed by your health care provider.  Control your hypertension.  Making food choices that are low in salt (sodium), saturated fat, trans fat, and cholesterol is recommended to manage hypertension.  Ask your health care provider if you need treatment to lower your blood pressure. Take any prescribed medicines to control hypertension as directed by your health care provider.  If you are 18-39 years of age, have your blood pressure checked every 3-5 years. If you are 40 years of age or older, have your blood pressure checked every year.  Maintain a healthy weight.  Reducing calorie intake and making food choices that are low in sodium, saturated fat, trans fat, and cholesterol are recommended to manage  weight.  Stop drug abuse.  Avoid taking birth control pills.  Talk to your health care provider about the risks of taking birth control pills if you are over 35 years old, smoke, get migraines, or have ever had a blood clot.  Get evaluated for sleep disorders (sleep apnea).  Talk to your health care provider about getting a sleep evaluation if you snore a lot or have excessive sleepiness.  Take medicines only as directed by your health care provider.  For some people, aspirin or blood thinners (anticoagulants) are helpful in reducing the risk of forming abnormal blood clots that can lead to stroke. If you have the irregular heart rhythm of atrial fibrillation, you should be on a blood thinner unless there is a good reason you cannot take them.  Understand all your medicine instructions.  Make sure that other conditions (such as anemia or atherosclerosis) are addressed. Get help right away if:  You have sudden weakness or numbness of the face, arm, or leg, especially on one side of the body.  Your face or eyelid droops to one side.  You have sudden confusion.  You have trouble speaking (aphasia) or understanding.  You have sudden trouble seeing in one or both eyes.  You have sudden trouble walking.  You have dizziness.  You have a loss of balance or coordination.  You have a sudden, severe headache with no known cause.  You have new chest pain or an irregular heartbeat. Any of these symptoms may represent a serious problem that is an emergency. Do not wait to see if the symptoms will go away.   Get medical help at once. Call your local emergency services (911 in [place] Do not drive yourself to the hospital. This information is not intended to replace advice given to you by your health care provider. Make sure you discuss any questions you have with your health care provider. Document Released: 10/04/2004 Document Revised: 02/02/2016 Document Reviewed: 02/27/2013 Elsevier  Interactive Patient Education  2017 Elsevier Inc.      Preventing Cerebrovascular Disease Arteries are blood vessels that carry blood that contains oxygen from the heart to all parts of the body. Cerebrovascular disease affects arteries that supply the brain. Any condition that blocks or disrupts blood flow to the brain can cause cerebrovascular disease. Brain cells that lose blood supply start to die within minutes (stroke). Stroke is the main danger of cerebrovascular disease. Atherosclerosis and high blood pressure are common causes of cerebrovascular disease. Atherosclerosis is narrowing and hardening of an artery that results when fat, cholesterol, calcium, or other substances (plaque) build up inside an artery. Plaque reduces blood flow through the artery. High blood pressure increases the risk of bleeding inside the brain. Making diet and lifestyle changes to prevent atherosclerosis and high blood pressure lowers your risk of cerebrovascular disease. What nutrition changes can be made?  Eat more fruits, vegetables, and whole grains.  Reduce how much saturated fat you eat. To do this, eat less red meat and fewer full-fat dairy products.  Eat healthy proteins instead of red meat. Healthy proteins include:  Fish. Eat fish that contains heart-healthy omega-3 fatty acids, twice a week. Examples include salmon, albacore tuna, mackerel, and herring.  Chicken.  Nuts.  Low-fat or nonfat yogurt.  Avoid processed meats, like bacon and lunchmeat.  Avoid foods that contain:  A lot of sugar, such as sweets and drinks with added sugar.  A lot of salt (sodium). Avoid adding extra salt to your food, as told by your health care provider.  Trans fats, such as margarine and baked goods. Trans fats may be listed as "partially hydrogenated oils" on food labels.  Check food labels to see how much sodium, sugar, and trans fats are in foods.  Use vegetable oils that contain low amounts of  saturated fat, such as olive oil or canola oil. What lifestyle changes can be made?  Drink alcohol in moderation. This means no more than 1 drink a day for nonpregnant women and 2 drinks a day for men. One drink equals 12 oz of beer, 5 oz of wine, or 1 oz of hard liquor.  If you are overweight, ask your health care provider to recommend a weight-loss plan for you. Losing 5-10 lb (2.2-4.5 kg) can reduce your risk of diabetes, atherosclerosis, and high blood pressure.  Exercise for 30?60 minutes on most days, or as much as told by your health care provider.  Do moderate-intensity exercise, such as brisk walking, bicycling, and water aerobics. Ask your health care provider which activities are safe for you.  Do not use any products that contain nicotine or tobacco, such as cigarettes and e-cigarettes. If you need help quitting, ask your health care provider. Why are these changes important? Making these changes lowers your risk of many diseases that can cause cerebrovascular disease and stroke. Stroke is a leading cause of death and disability. Making these changes also improves your overall health and quality of life. What can I do to lower my risk? The following factors make you more likely to develop cerebrovascular disease:  Being overweight.  Smoking.  Being physically   inactive.  Eating a high-fat diet.  Having certain health conditions, such as:  Diabetes.  High blood pressure.  Heart disease.  Atherosclerosis.  High cholesterol.  Sickle cell disease. Talk with your health care provider about your risk for cerebrovascular disease. Work with your health care provider to control diseases that you have that may contribute to cerebrovascular disease. Your health care provider may prescribe medicines to help prevent major causes of cerebrovascular disease. Where to find more information: Learn more about preventing cerebrovascular disease from:  [place], Lung, and  [place]: MoAnalyst.de  Centers for Disease Control and Prevention: http://www.curry-wood.biz/ Summary  Cerebrovascular disease can lead to a stroke.  Atherosclerosis and high blood pressure are major causes of cerebrovascular disease.  Making diet and lifestyle changes can reduce your risk of cerebrovascular disease.  Work with your health care provider to get your risk factors under control to reduce your risk of cerebrovascular disease. This information is not intended to replace advice given to you by your health care provider. Make sure you discuss any questions you have with your health care provider. Document Released: 09/11/2015 Document Revised: 03/16/2016 Document Reviewed: 09/11/2015 Elsevier Interactive Patient Education  2017 [employer].    Before your next abdominal ultrasound:  Take two Extra-Strength Gas-X capsules at bedtime the night before the test. Take another two Extra-Strength Gas-X capsules 3 hours before the test.

## 2016-11-09 NOTE — Progress Notes (Signed)
CC: Follow up s/p splenic artery aneurysm embolization, L renal artery aneurysm    History of Present Illness  [name] is a 83 y.o. ([birth year]) female patient of Dr. Imogene Woodard who presents with chief complaint: follow up on SAA embolization (01/28/14). This was complicated by delayed development of R femoral PSA requiring repair (01/31/14). Pt denies any claudication or rest pain. She healed the prior incision without any difficulties. She had one day of severe LUQ pain which resolved. She had her immunization prior to her embolization. 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  She lives in [place]. She states 3 days ago her systolic blood pressure in her PCP's office was in the 140's.   She was hospitalized in May 2017 at [facility] for an acute ischemic right MCA stroke.   Pt states that her PCP told her that she has a mild heart murmur.   She had normal a colonoscopy in 2016.  She sees Dr. Charlann Woodard, [facility],  for intermittent radiculopathy in left leg.   Past Medical History:  Diagnosis Date  . Anemia   . Arthritis    "was in my knees"  . High cholesterol   . History of blood transfusion    "S/P knee OR"  . Hypertension   . Hypothyroidism   . Pneumonia    "maybe in my teens"  . Recurrent UTI (urinary tract infection)   . Stroke [facility]) 1990's   "mild", denies residual on 01/28/2014    Social History Social History  Substance Use Topics  . Smoking status: Never Smoker  . Smokeless tobacco: Never Used  . Alcohol use No    Family History Family History  Problem Relation Age of Onset  . Hypertension Son   . Heart attack Neg Hx     Surgical History Past Surgical History:  Procedure Laterality Date  . BLADDER SUSPENSION  2012  . EMBOLIZATION N/A 01/28/2014   Procedure: EMBOLIZATION;  Surgeon: Tammy Hertz, MD;  Location: [facility] CATH LAB;  Service: Cardiovascular;  Laterality: N/A;  Splenic  Artery  . FEMUR IM NAIL Right 03/23/2014   Procedure: INTRAMEDULLARY (IM) NAIL FEMORAL;  Surgeon: Tammy Pal, MD;  Location: [facility] ORS;  Service: Orthopedics;  Laterality: Right;  . JOINT REPLACEMENT    . MUSCLE BIOPSY Left 1990's   "knot biopsy from carrying mail bag for years"  . REVISON OF ARTERIOVENOUS FISTULA Right 01/31/2014   Procedure: REPAIR PSEUDOANEURYSM;  Surgeon: Tammy Libman, MD;  Location: [facility] OR;  Service: Vascular;  Laterality: Right;  Repair of Femoral Pseudoaneurysm.  . SPLENIC ARTERY EMBOLIZATION  01/28/2014  . TOTAL KNEE ARTHROPLASTY Bilateral 2012  . VAGINAL HYSTERECTOMY    . VISCERAL ANGIOGRAM N/A 01/28/2014   Procedure: VISCERAL ANGIOGRAM;  Surgeon: Tammy Hertz, MD;  Location: [facility] CATH LAB;  Service: Cardiovascular;  Laterality: N/A;    No Known Allergies  Current Outpatient Prescriptions  Medication Sig Dispense Refill  . bethanechol (URECHOLINE) 50 MG tablet Take 50 mg by mouth 2 (two) times daily.    . Calcium Carbonate-Vitamin D (CALCIUM 500 + D PO) Take 500 mg by mouth 2 (two) times daily.    . cholecalciferol (VITAMIN D) 1000 UNITS tablet Take 2,000 Units by mouth daily.    . clopidogrel (PLAVIX) 75 MG tablet Take 1 tablet (75 mg total) by mouth daily. 30 tablet 0  . Cranberry-Vitamin C-Vitamin E (CRANBERRY PLUS VITAMIN C) 140-100-3 MG-MG-UNIT CAPS  Take 2,000 Units by mouth every morning.     . ezetimibe (ZETIA) 10 MG tablet Take 1 tablet (10 mg total) by mouth at bedtime. 30 tablet 0  . famotidine (PEPCID) 10 MG tablet Take 1 tablet (10 mg total) by mouth 2 (two) times daily. (Patient taking differently: Take 10 mg by mouth 2 (two) times daily as needed. ) 60 tablet 0  . folic acid (FOLVITE) 400 MCG tablet Take 400 mcg by mouth daily.    Marland Kitchen labetalol (NORMODYNE) 200 MG tablet Take 1.5 tablets (300 mg total) by mouth 2 (two) times daily. 90 tablet 0  . lactose free nutrition (BOOST) LIQD Take 237 mLs by mouth 3 (three) times daily as needed (meal replacement).     Marland Kitchen lisinopril (PRINIVIL,ZESTRIL) 40 MG tablet Take 1 tablet (40 mg total) by mouth at bedtime. 30 tablet 0  . Omega-3 Fatty Acids (FISH OIL) 1000 MG CAPS Take 1,000 mg by mouth 2 (two) times daily.    . pravastatin (PRAVACHOL) 20 MG tablet Take 1 tablet (20 mg total) by mouth daily. 30 tablet 0   No current facility-administered medications for this visit.     REVIEW OF SYSTEMS: see HPI for pertinent positives and negatives    Physical Examination  Vitals:   11/09/16 1158 11/09/16 1159  BP: (!) 158/91 (!) 161/94  Pulse: 63   Resp: 18   Temp: 97 F (36.1 C)   TempSrc: Oral   SpO2: 96%   Weight: 200 lb (90.7 kg)   Height: 5\' 7"  (1.702 m)    Body mass index is 31.32 kg/m.   General: A&O x 3, WD, obese female  Pulmonary: Sym exp, respirations are non labored, good air movt, CTAB, no rales, rhonchi, or wheezing  Cardiac: RRR, Nl S1, S2, + murmur  Vascular: Vessel Right Left  Radial Palpable Palpable  Carotid Palpable, with bruit Palpable, with bruit  Aorta Not palpable N/A  Femoral 3+Palpable 1+Palpable  Popliteal Not palpable Not palpable  PT Not Palpable Not Palpable  DP 2+Palpable 2+Palpable   Gastrointestinal: soft, NTND, -G/R, - HSM, - palpable masses, - CVAT B  Musculoskeletal: M/S 5/5 throughout, extremities without ischemic changes   Neurologic: Pain and light touch intact in extremities , Motor exam as listed above. CN 2-12 intact.     Non-Invasive Vascular Imaging  Left Renal Duplex (Date: 11-08-2016)  L kidney size: 12.8 cm  Technically difficult due to body habitus and bowel gas.  The left mid to distal renal artery with no visualized stenosis, proximal renal artery not visualized.   The aneurysm in the distal renal artery measures 1.1 cm, unchanged from the last exam on 09-30-15.   Medical Decision Making  [name] is a 83 y.o. female who presents with: stable left renal artery  aneurysm with maximum diameter of 1.1 cm, same as 03/18/15 and 09-30-15. She had an acute ischemic right MCA stroke in May 2017, no subsequent strokes or TIA's. She has bilateral carotid bruits, no carotid duplex results on file.   Based on the patient's vascular studies and examination, and after discussing with Dr. Imogene Woodard, I have offered the patient: left renal artery duplex and carotid duplex in  6 months.  Dr. Nicky Pugh assessment from 09/17/14:  Mesenteric duplex done today demonstrates the difficulty with follow-up on these embolization cases post-op. Without expertise with such, getting a good evaluation of the residual sac is impossible. Based on my intraoperative imaging, I suspect complete thrombosis of the sac hence the  lack of mention on the report today. I don't think the expense and radiation exposure of a repeat CTA is indicated in this case.  I would continue surveillance of the L renal artery aneurysm with L renal duplex, which will be scheduled in 6 months then annually if no significant change.   I discussed in depth with the patient the nature of atherosclerosis, and emphasized the importance of maximal medical management including strict control of blood pressure, blood glucose, and lipid levels, antiplatelet agents, obtaining regular exercise, and cessation of smoking.  The patient is aware that without maximal medical management the underlying atherosclerotic disease process will progress, limiting the benefit of any interventions.  Thank you for allowing [place] to participate in this patient's care.  Riggin Cuttino, Carma Lair, RN, MSN, FNP-C [facility] [place] Office: [phone]  11/09/2016, 12:01 PM  Clinic MD:  [name]

## 2016-11-16 NOTE — Addendum Note (Signed)
Addended by: Lianne Cure A on: 11/16/2016 09:57 AM   Modules accepted: Orders

## 2016-11-22 DIAGNOSIS — Z9181 History of falling: Secondary | ICD-10-CM | POA: Diagnosis not present

## 2016-11-22 DIAGNOSIS — N959 Unspecified menopausal and perimenopausal disorder: Secondary | ICD-10-CM | POA: Diagnosis not present

## 2016-11-22 DIAGNOSIS — Z136 Encounter for screening for cardiovascular disorders: Secondary | ICD-10-CM | POA: Diagnosis not present

## 2016-11-22 DIAGNOSIS — Z6832 Body mass index (BMI) 32.0-32.9, adult: Secondary | ICD-10-CM | POA: Diagnosis not present

## 2016-11-22 DIAGNOSIS — Z1389 Encounter for screening for other disorder: Secondary | ICD-10-CM | POA: Diagnosis not present

## 2016-11-22 DIAGNOSIS — Z0000 Encounter for general adult medical examination without abnormal findings: Secondary | ICD-10-CM | POA: Diagnosis not present

## 2016-11-22 DIAGNOSIS — E669 Obesity, unspecified: Secondary | ICD-10-CM | POA: Diagnosis not present

## 2016-11-22 DIAGNOSIS — E785 Hyperlipidemia, unspecified: Secondary | ICD-10-CM | POA: Diagnosis not present

## 2016-11-22 DIAGNOSIS — Z1231 Encounter for screening mammogram for malignant neoplasm of breast: Secondary | ICD-10-CM | POA: Diagnosis not present

## 2016-11-27 DIAGNOSIS — N390 Urinary tract infection, site not specified: Secondary | ICD-10-CM | POA: Diagnosis not present

## 2016-12-17 DIAGNOSIS — R339 Retention of urine, unspecified: Secondary | ICD-10-CM | POA: Diagnosis not present

## 2016-12-17 DIAGNOSIS — N3090 Cystitis, unspecified without hematuria: Secondary | ICD-10-CM | POA: Diagnosis not present

## 2016-12-17 DIAGNOSIS — N319 Neuromuscular dysfunction of bladder, unspecified: Secondary | ICD-10-CM | POA: Diagnosis not present

## 2017-01-08 ENCOUNTER — Ambulatory Visit: Payer: Medicare Other | Admitting: Nurse Practitioner

## 2017-01-09 ENCOUNTER — Encounter: Payer: Self-pay | Admitting: Nurse Practitioner

## 2017-01-21 DIAGNOSIS — S199XXA Unspecified injury of neck, initial encounter: Secondary | ICD-10-CM | POA: Diagnosis not present

## 2017-01-21 DIAGNOSIS — S6990XA Unspecified injury of unspecified wrist, hand and finger(s), initial encounter: Secondary | ICD-10-CM | POA: Diagnosis not present

## 2017-01-21 DIAGNOSIS — S0990XA Unspecified injury of head, initial encounter: Secondary | ICD-10-CM | POA: Diagnosis not present

## 2017-01-21 DIAGNOSIS — S8001XA Contusion of right knee, initial encounter: Secondary | ICD-10-CM | POA: Diagnosis not present

## 2017-01-21 DIAGNOSIS — S50311A Abrasion of right elbow, initial encounter: Secondary | ICD-10-CM | POA: Diagnosis not present

## 2017-01-21 DIAGNOSIS — T148XXA Other injury of unspecified body region, initial encounter: Secondary | ICD-10-CM | POA: Diagnosis not present

## 2017-01-24 DIAGNOSIS — S8001XA Contusion of right knee, initial encounter: Secondary | ICD-10-CM | POA: Diagnosis not present

## 2017-01-24 DIAGNOSIS — Z79899 Other long term (current) drug therapy: Secondary | ICD-10-CM | POA: Diagnosis not present

## 2017-01-24 DIAGNOSIS — G44319 Acute post-traumatic headache, not intractable: Secondary | ICD-10-CM | POA: Diagnosis not present

## 2017-01-24 DIAGNOSIS — W19XXXA Unspecified fall, initial encounter: Secondary | ICD-10-CM | POA: Diagnosis not present

## 2017-01-24 DIAGNOSIS — Z6831 Body mass index (BMI) 31.0-31.9, adult: Secondary | ICD-10-CM | POA: Diagnosis not present

## 2017-01-24 DIAGNOSIS — M25561 Pain in right knee: Secondary | ICD-10-CM | POA: Diagnosis not present

## 2017-02-06 DIAGNOSIS — N390 Urinary tract infection, site not specified: Secondary | ICD-10-CM | POA: Diagnosis not present

## 2017-02-06 IMAGING — DX DG CHEST 2V
3 series · 3 of 3 positions shown · non-contrast
Comparison: 03/23/2014

CLINICAL DATA: Stroke last night.  Weakness.

EXAM:
CHEST  2 VIEW

[x chest ap (1 of 2)]
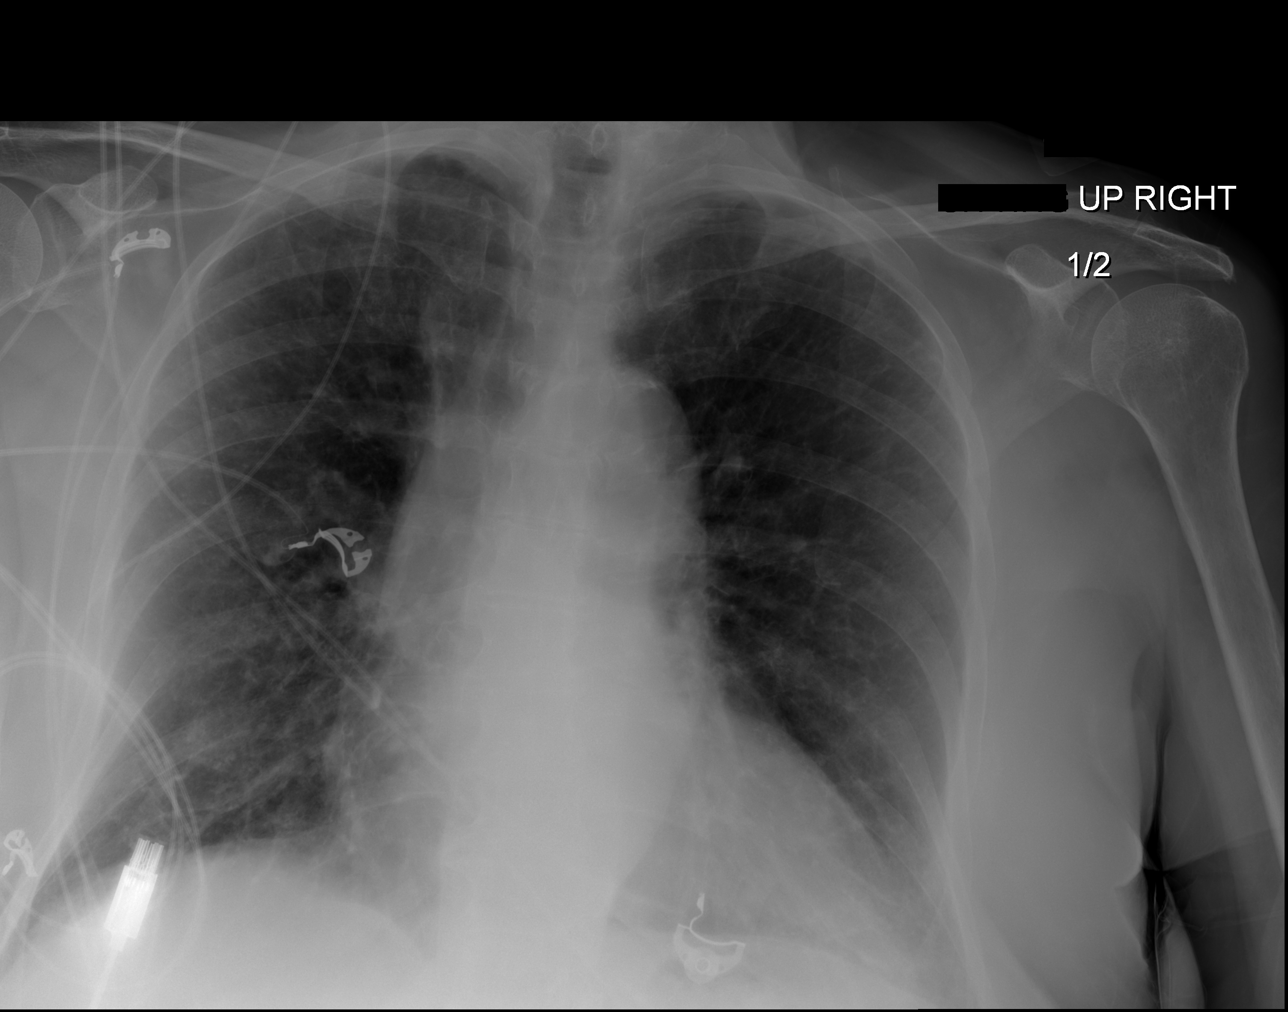

[x chest ap (2 of 2)]
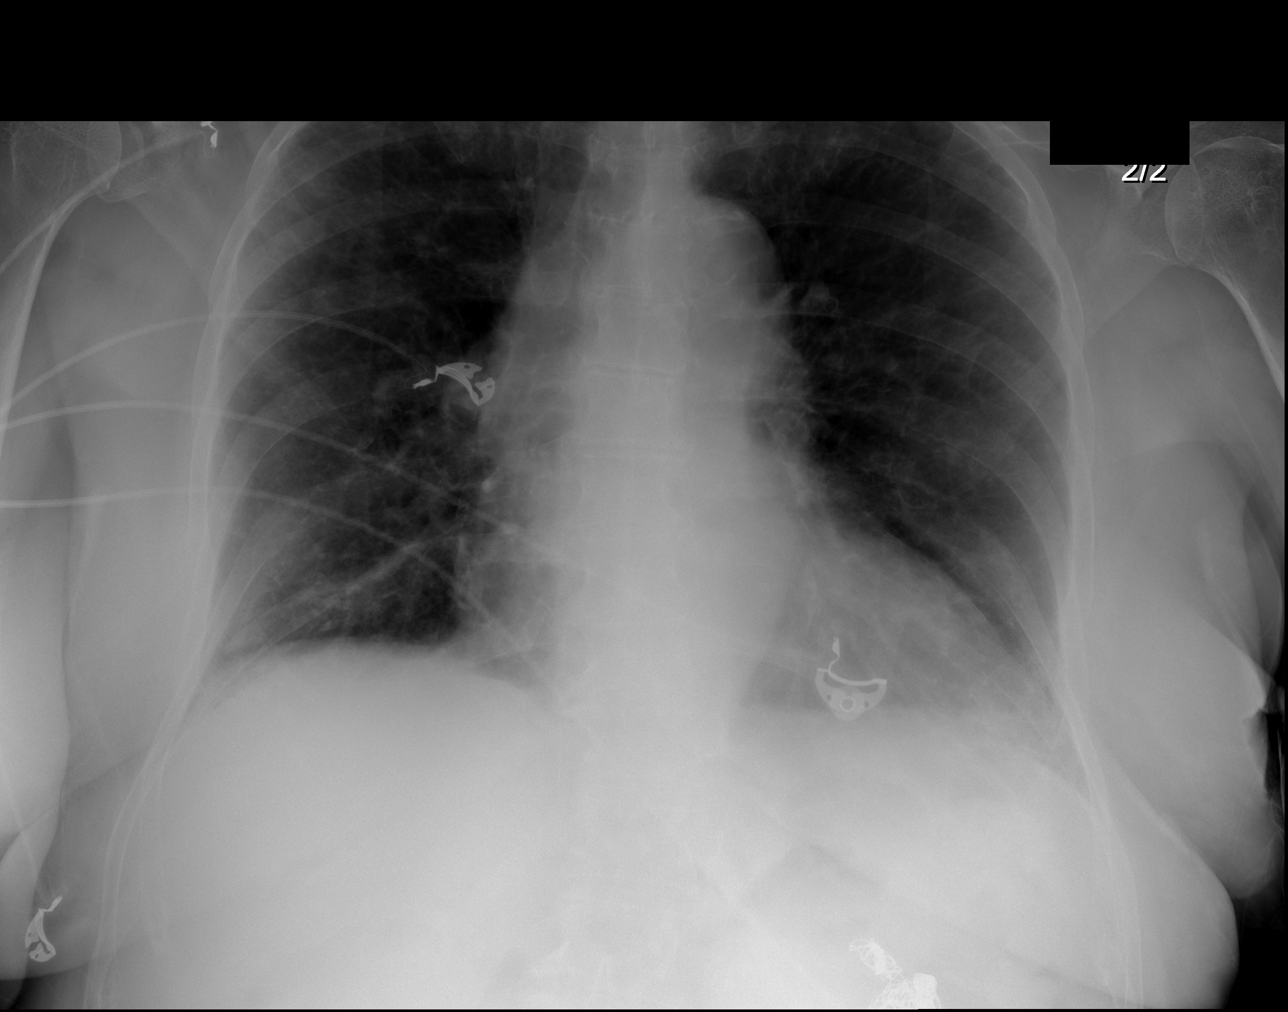

[w chest lat]
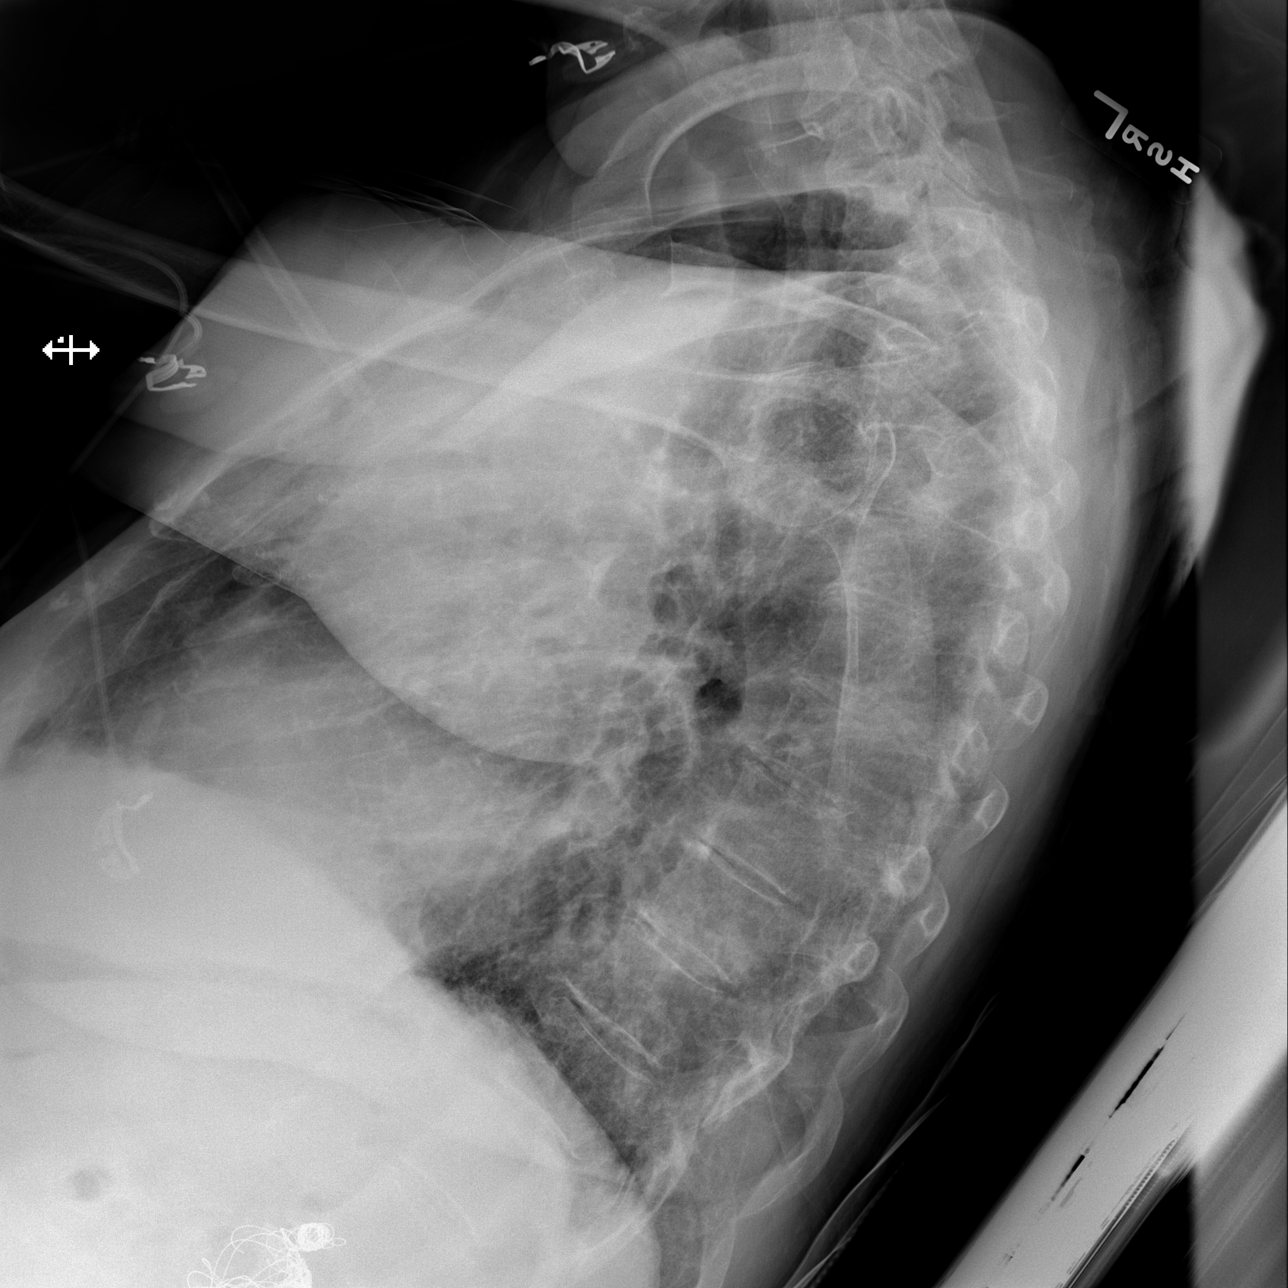

[3 of 3 positions shown; findings below may reference images not displayed]

FINDINGS: Diffuse interstitial prominence throughout the lungs, unchanged,
likely chronic interstitial lung disease. Mild emphysema/COPD. Heart
is borderline in size. No confluent opacities or effusions. No real
change since prior study.
IMPRESSION: Stable chronic interstitial lung disease and COPD.

Borderline heart size.

## 2017-02-07 DIAGNOSIS — Z6832 Body mass index (BMI) 32.0-32.9, adult: Secondary | ICD-10-CM | POA: Diagnosis not present

## 2017-02-07 DIAGNOSIS — R7301 Impaired fasting glucose: Secondary | ICD-10-CM | POA: Diagnosis not present

## 2017-02-07 DIAGNOSIS — E785 Hyperlipidemia, unspecified: Secondary | ICD-10-CM | POA: Diagnosis not present

## 2017-02-07 DIAGNOSIS — I639 Cerebral infarction, unspecified: Secondary | ICD-10-CM | POA: Diagnosis not present

## 2017-02-07 DIAGNOSIS — E039 Hypothyroidism, unspecified: Secondary | ICD-10-CM | POA: Diagnosis not present

## 2017-02-07 DIAGNOSIS — M8589 Other specified disorders of bone density and structure, multiple sites: Secondary | ICD-10-CM | POA: Diagnosis not present

## 2017-02-07 DIAGNOSIS — M810 Age-related osteoporosis without current pathological fracture: Secondary | ICD-10-CM | POA: Diagnosis not present

## 2017-02-07 DIAGNOSIS — E559 Vitamin D deficiency, unspecified: Secondary | ICD-10-CM | POA: Diagnosis not present

## 2017-02-07 DIAGNOSIS — I10 Essential (primary) hypertension: Secondary | ICD-10-CM | POA: Diagnosis not present

## 2017-02-20 DIAGNOSIS — N319 Neuromuscular dysfunction of bladder, unspecified: Secondary | ICD-10-CM | POA: Diagnosis not present

## 2017-02-20 DIAGNOSIS — N3090 Cystitis, unspecified without hematuria: Secondary | ICD-10-CM | POA: Diagnosis not present

## 2017-02-20 DIAGNOSIS — R339 Retention of urine, unspecified: Secondary | ICD-10-CM | POA: Diagnosis not present

## 2017-04-11 DIAGNOSIS — N390 Urinary tract infection, site not specified: Secondary | ICD-10-CM | POA: Diagnosis not present

## 2017-04-25 DIAGNOSIS — N3090 Cystitis, unspecified without hematuria: Secondary | ICD-10-CM | POA: Diagnosis not present

## 2017-04-25 DIAGNOSIS — N8111 Cystocele, midline: Secondary | ICD-10-CM | POA: Diagnosis not present

## 2017-04-25 DIAGNOSIS — R339 Retention of urine, unspecified: Secondary | ICD-10-CM | POA: Diagnosis not present

## 2017-04-25 DIAGNOSIS — N319 Neuromuscular dysfunction of bladder, unspecified: Secondary | ICD-10-CM | POA: Diagnosis not present

## 2017-04-29 DIAGNOSIS — H2513 Age-related nuclear cataract, bilateral: Secondary | ICD-10-CM | POA: Diagnosis not present

## 2017-05-16 DIAGNOSIS — I10 Essential (primary) hypertension: Secondary | ICD-10-CM | POA: Diagnosis not present

## 2017-05-16 DIAGNOSIS — R7301 Impaired fasting glucose: Secondary | ICD-10-CM | POA: Diagnosis not present

## 2017-05-16 DIAGNOSIS — E785 Hyperlipidemia, unspecified: Secondary | ICD-10-CM | POA: Diagnosis not present

## 2017-05-16 DIAGNOSIS — Z1231 Encounter for screening mammogram for malignant neoplasm of breast: Secondary | ICD-10-CM | POA: Diagnosis not present

## 2017-05-16 DIAGNOSIS — M8589 Other specified disorders of bone density and structure, multiple sites: Secondary | ICD-10-CM | POA: Diagnosis not present

## 2017-05-16 DIAGNOSIS — I63511 Cerebral infarction due to unspecified occlusion or stenosis of right middle cerebral artery: Secondary | ICD-10-CM | POA: Diagnosis not present

## 2017-05-16 DIAGNOSIS — M810 Age-related osteoporosis without current pathological fracture: Secondary | ICD-10-CM | POA: Diagnosis not present

## 2017-05-16 DIAGNOSIS — Z6831 Body mass index (BMI) 31.0-31.9, adult: Secondary | ICD-10-CM | POA: Diagnosis not present

## 2017-05-16 DIAGNOSIS — E559 Vitamin D deficiency, unspecified: Secondary | ICD-10-CM | POA: Diagnosis not present

## 2017-05-16 DIAGNOSIS — E039 Hypothyroidism, unspecified: Secondary | ICD-10-CM | POA: Diagnosis not present

## 2017-05-17 ENCOUNTER — Encounter (HOSPITAL_COMMUNITY): Payer: Medicare Other

## 2017-05-17 ENCOUNTER — Ambulatory Visit: Payer: Medicare Other | Admitting: Family

## 2017-06-11 DIAGNOSIS — N390 Urinary tract infection, site not specified: Secondary | ICD-10-CM | POA: Diagnosis not present

## 2017-07-03 ENCOUNTER — Encounter: Payer: Self-pay | Admitting: Family

## 2017-07-03 ENCOUNTER — Ambulatory Visit (HOSPITAL_COMMUNITY)
Admission: RE | Admit: 2017-07-03 | Discharge: 2017-07-03 | Disposition: A | Discharge status: Home / Self Care | Payer: Medicare Other | Source: Ambulatory Visit | Attending: Family | Admitting: Family

## 2017-07-03 ENCOUNTER — Ambulatory Visit (INDEPENDENT_AMBULATORY_CARE_PROVIDER_SITE_OTHER)
Admission: RE | Admit: 2017-07-03 | Discharge: 2017-07-03 | Disposition: A | Discharge status: Home / Self Care | Payer: Medicare Other | Source: Ambulatory Visit | Attending: Family | Admitting: Family

## 2017-07-03 ENCOUNTER — Ambulatory Visit (INDEPENDENT_AMBULATORY_CARE_PROVIDER_SITE_OTHER): Payer: Medicare Other | Admitting: Family

## 2017-07-03 VITALS — BP 178/101 | HR 70 | Temp 97.2°F | Resp 20 | Ht 67.0 in | Wt 199.9 lb

## 2017-07-03 DIAGNOSIS — I722 Aneurysm of renal artery: Secondary | ICD-10-CM | POA: Insufficient documentation

## 2017-07-03 DIAGNOSIS — R0989 Other specified symptoms and signs involving the circulatory and respiratory systems: Secondary | ICD-10-CM

## 2017-07-03 DIAGNOSIS — Z8673 Personal history of transient ischemic attack (TIA), and cerebral infarction without residual deficits: Secondary | ICD-10-CM

## 2017-07-03 DIAGNOSIS — I728 Aneurysm of other specified arteries: Secondary | ICD-10-CM | POA: Diagnosis not present

## 2017-07-03 DIAGNOSIS — I724 Aneurysm of artery of lower extremity: Secondary | ICD-10-CM | POA: Diagnosis not present

## 2017-07-03 LAB — VAS US CAROTID
LCCAPSYS: 77 cm/s
LEFT ECA DIAS: -11 cm/s
LEFT VERTEBRAL DIAS: -11 cm/s
LICAPSYS: -55 cm/s
Left CCA dist dias: 15 cm/s
Left CCA dist sys: 53 cm/s
Left CCA prox dias: 18 cm/s
Left ICA dist dias: -21 cm/s
Left ICA dist sys: -83 cm/s
Left ICA prox dias: -16 cm/s
RCCAPDIAS: 14 cm/s
RIGHT CCA MID DIAS: 16 cm/s
RIGHT ECA DIAS: -14 cm/s
RIGHT VERTEBRAL DIAS: -19 cm/s
Right CCA prox sys: 67 cm/s
Right cca dist sys: -46 cm/s

## 2017-07-03 NOTE — Progress Notes (Signed)
[facility] [place]   CC: Follow up s/p splenic artery aneurysm embolization, L renal artery aneurysm     History of Present Illness  [name] is a 83 y.o. (12-Sep-1933) female patient of Dr. Imogene Burn who presents with chief complaint: follow up on SAA embolization (01/28/14). This was complicated by delayed development of R femoral PSA requiring repair (01/31/14). Pt denies any claudication or rest pain. She healed the prior incision without any difficulties. She had one day of severe LUQ pain which resolved. She had her immunization prior to her embolization. 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  She lives in [place]. Her son states that at home her blood pressure is130-150/70-90, is elevated currently.   She was hospitalized in May 2017 at [facility] for an acute ischemic right MCA stroke, denies any subsequent stoke or TIA.   Pt states that her PCP told her that she has a mild heart murmur.   She had normal a colonoscopy in 2016.  She sees Dr. Charlann Boxer, [facility], for intermittent radiculopathy in left leg. She also had bilateral knee replacements.   The patient denies claudication sx's in legs with walking. She exercises regularly.    Pt Diabetic: No Pt smoker: non-smoker  Past Medical History:  Diagnosis Date  . Anemia   . Arthritis    "was in my knees"  . High cholesterol   . History of blood transfusion    "S/P knee OR"  . Hypertension   . Hypothyroidism   . Pneumonia    "maybe in my teens"  . Recurrent UTI (urinary tract infection)   . Stroke [facility]) 1990's   "mild", denies residual on 01/28/2014   Past Surgical History:  Procedure Laterality Date  . BLADDER SUSPENSION  2012  . EMBOLIZATION N/A 01/28/2014   Procedure: EMBOLIZATION;  Surgeon: Fransisco Hertz, MD;  Location: [facility] CATH LAB;  Service: Cardiovascular;  Laterality: N/A;  Splenic Artery  . FEMUR IM NAIL Right  03/23/2014   Procedure: INTRAMEDULLARY (IM) NAIL FEMORAL;  Surgeon: Shelda Pal, MD;  Location: [facility] ORS;  Service: Orthopedics;  Laterality: Right;  . JOINT REPLACEMENT    . MUSCLE BIOPSY Left 1990's   "knot biopsy from carrying mail bag for years"  . REVISON OF ARTERIOVENOUS FISTULA Right 01/31/2014   Procedure: REPAIR PSEUDOANEURYSM;  Surgeon: Nada Libman, MD;  Location: [facility] OR;  Service: Vascular;  Laterality: Right;  Repair of Femoral Pseudoaneurysm.  . SPLENIC ARTERY EMBOLIZATION  01/28/2014  . TOTAL KNEE ARTHROPLASTY Bilateral 2012  . VAGINAL HYSTERECTOMY    . VISCERAL ANGIOGRAM N/A 01/28/2014   Procedure: VISCERAL ANGIOGRAM;  Surgeon: Fransisco Hertz, MD;  Location: [facility] CATH LAB;  Service: Cardiovascular;  Laterality: N/A;   Social History Social History   Social History  . Marital status: Widowed    Spouse name: N/A  . Number of children: N/A  . Years of education: N/A   Occupational History  . Not on file.   Social History Main Topics  . Smoking status: Never Smoker  . Smokeless tobacco: Never Used  . Alcohol use No  . Drug use: No  . Sexual activity: No   Other Topics Concern  . Not on file   Social History Narrative  . No narrative on file   Family History Family History  Problem Relation Age of Onset  . Hypertension Son   . Heart attack Neg Hx  Current Outpatient Prescriptions on File Prior to Visit  Medication Sig Dispense Refill  . bethanechol (URECHOLINE) 50 MG tablet Take 50 mg by mouth 2 (two) times daily.    . Calcium Carbonate-Vitamin D (CALCIUM 500 + D PO) Take 500 mg by mouth 2 (two) times daily.    . cholecalciferol (VITAMIN D) 1000 UNITS tablet Take 2,000 Units by mouth daily.    . clopidogrel (PLAVIX) 75 MG tablet Take 1 tablet (75 mg total) by mouth daily. 30 tablet 0  . Cranberry-Vitamin C-Vitamin E (CRANBERRY PLUS VITAMIN C) 140-100-3 MG-MG-UNIT CAPS Take 2,000 Units by mouth every morning.     . ezetimibe (ZETIA) 10 MG tablet Take 1  tablet (10 mg total) by mouth at bedtime. 30 tablet 0  . famotidine (PEPCID) 10 MG tablet Take 1 tablet (10 mg total) by mouth 2 (two) times daily. (Patient taking differently: Take 10 mg by mouth 2 (two) times daily as needed. ) 60 tablet 0  . folic acid (FOLVITE) 400 MCG tablet Take 400 mcg by mouth daily.    Marland Kitchen labetalol (NORMODYNE) 200 MG tablet Take 1.5 tablets (300 mg total) by mouth 2 (two) times daily. 90 tablet 0  . lactose free nutrition (BOOST) LIQD Take 237 mLs by mouth 3 (three) times daily as needed (meal replacement).    Marland Kitchen lisinopril (PRINIVIL,ZESTRIL) 40 MG tablet Take 1 tablet (40 mg total) by mouth at bedtime. 30 tablet 0  . Omega-3 Fatty Acids (FISH OIL) 1000 MG CAPS Take 1,000 mg by mouth 2 (two) times daily.    . pravastatin (PRAVACHOL) 20 MG tablet Take 1 tablet (20 mg total) by mouth daily. 30 tablet 0   No current facility-administered medications on file prior to visit.    Allergies  Allergen Reactions  . Aspirin Other (See Comments)    Causes blood in urine    ROS: See HPI for pertinent positives and negatives.  Physical Examination  Vitals:   07/03/17 1054 07/03/17 1057  BP: (!) 167/91 (!) 178/101  Pulse: 70   Resp: 20   Temp: (!) 97.2 F (36.2 C)   TempSrc: Oral   SpO2: 98%   Weight: 199 lb 14.4 oz (90.7 kg)   Height: 5\' 7"  (1.702 m)    Body mass index is 31.31 kg/m.  General: A&O x 3, WD, obese elderly female  Pulmonary: Sym exp, respirations are non labored, good air movt, CTAB, no rales, rhonchi, or wheezing  Cardiac: RRR, Nl S1, S2, + murmur  Vascular: Vessel Right Left  Radial Palpable Palpable  Carotid Palpable, with bruit Palpable, with bruit  Aorta Not palpable N/A  Femoral 3+Palpable 1+Palpable  Popliteal Not palpable Not palpable  PT Not Palpable Not Palpable  DP 2+Palpable 2+Palpable   Gastrointestinal: soft, NTND, -G/R, - HSM, - palpable masses, - CVAT B  Musculoskeletal:  M/S 5/5 throughout, extremities without ischemic changes   Skin: No rash, no cellulitis, no ulcers.   Neurologic: Pain and light touch intact in extremities , Motor exam as listed above, CN 2-12 intact.     DATA  Carotid Duplex (07/03/17): 1-39% bilateral ICA stenosis.  Mid right ICA is tortuous. Distal left ICA is tortuous.  Bilateral vertebral artery flow is antegrade.  Bilateral subclavian artery waveforms are normal.  No previous exam for comparison.   Left Renal Duplex (Date: 07-03-17):  L kidney size: 12.0 cm   Previously coiled splenic artery aneurysm identified measuring 1.9 cm x 2.3 cm.  Structure identified in the left kidney  measuring 1.1 cm is consistent with images from previous exams described as "distal renal artery/hilar artery" aneurysm, however the walls were not clearly visualized.   No significant change compared to previous study on 11-08-16.   The ultrasonographer discussed with me that on this as in the previous 3 duplex of the renal artery, unable to visualize with any significant degree of certainty.   Medical Decision Making  The patient is a 83 y.o. female who presents with: stable left renal artery aneurysm with maximum diameter of 1.1 cm, same as 03/18/15 and 09-30-15. She had an acute ischemic right MCA stroke in May 2017, no subsequent strokes or TIA's. She has bilateral carotid bruits, no significant carotid artery stenosis on duplex, but there is significant tortuosity which could account for the bilateral bruits.   Based on the patient's vascular studies and examination, and based on Dr. Nicky Pugh recommendations from 09-17-14,I have offered the patient: left renal artery duplex  in 1 year, carotid duplex  in 3 years.   Dr. Nicky Pugh assessment from 09/17/14:  Mesenteric duplex done today demonstrates the difficulty with follow-up on these embolization cases post-op. Without expertise with such, getting a good evaluation of the residual sac is  impossible. Based on my intraoperative imaging, I suspect complete thrombosis of the sac hence the lack of mention on the report today. I don't think the expense and radiation exposure of a repeat CTA is indicated in this case. I would continue surveillance of the L renal artery aneurysm with L renal duplex, which will be scheduled in 6 months then annually if no significant change.   I emphasized the importance of maximal medical management including strict control of blood pressure, blood glucose, and lipid levels, antiplatelet agents, obtaining regular exercise, and continued cessation of smoking.   The patient was advised to call 911 should the patient experience sudden onset abdominal or back pain.   Thank you for allowing [place] to participate in this patient's care.  Charisse March, RN, MSN, FNP-C [facility] [place] Office: [phone]  Clinic Physician: [place]  07/03/2017, 11:05 AM

## 2017-07-03 NOTE — Patient Instructions (Signed)
Stroke Prevention Some medical conditions and behaviors are associated with an increased chance of having a stroke. You may prevent a stroke by making healthy choices and managing medical conditions. How can I reduce my risk of having a stroke?  Stay physically active. Get at least 30 minutes of activity on most or all days.  Do not smoke. It may also be helpful to avoid exposure to secondhand smoke.  Limit alcohol use. Moderate alcohol use is considered to be:  No more than 2 drinks per day for men.  No more than 1 drink per day for nonpregnant women.  Eat healthy foods. This involves:  Eating 5 or more servings of fruits and vegetables a day.  Making dietary changes that address high blood pressure (hypertension), high cholesterol, diabetes, or obesity.  Manage your cholesterol levels.  Making food choices that are high in fiber and low in saturated fat, trans fat, and cholesterol may control cholesterol levels.  Take any prescribed medicines to control cholesterol as directed by your health care provider.  Manage your diabetes.  Controlling your carbohydrate and sugar intake is recommended to manage diabetes.  Take any prescribed medicines to control diabetes as directed by your health care provider.  Control your hypertension.  Making food choices that are low in salt (sodium), saturated fat, trans fat, and cholesterol is recommended to manage hypertension.  Ask your health care provider if you need treatment to lower your blood pressure. Take any prescribed medicines to control hypertension as directed by your health care provider.  If you are 18-39 years of age, have your blood pressure checked every 3-5 years. If you are 40 years of age or older, have your blood pressure checked every year.  Maintain a healthy weight.  Reducing calorie intake and making food choices that are low in sodium, saturated fat, trans fat, and cholesterol are recommended to manage  weight.  Stop drug abuse.  Avoid taking birth control pills.  Talk to your health care provider about the risks of taking birth control pills if you are over 35 years old, smoke, get migraines, or have ever had a blood clot.  Get evaluated for sleep disorders (sleep apnea).  Talk to your health care provider about getting a sleep evaluation if you snore a lot or have excessive sleepiness.  Take medicines only as directed by your health care provider.  For some people, aspirin or blood thinners (anticoagulants) are helpful in reducing the risk of forming abnormal blood clots that can lead to stroke. If you have the irregular heart rhythm of atrial fibrillation, you should be on a blood thinner unless there is a good reason you cannot take them.  Understand all your medicine instructions.  Make sure that other conditions (such as anemia or atherosclerosis) are addressed. Get help right away if:  You have sudden weakness or numbness of the face, arm, or leg, especially on one side of the body.  Your face or eyelid droops to one side.  You have sudden confusion.  You have trouble speaking (aphasia) or understanding.  You have sudden trouble seeing in one or both eyes.  You have sudden trouble walking.  You have dizziness.  You have a loss of balance or coordination.  You have a sudden, severe headache with no known cause.  You have new chest pain or an irregular heartbeat. Any of these symptoms may represent a serious problem that is an emergency. Do not wait to see if the symptoms will go away.   Get medical help at once. Call your local emergency services (911 in [place] Do not drive yourself to the hospital. This information is not intended to replace advice given to you by your health care provider. Make sure you discuss any questions you have with your health care provider. Document Released: 10/04/2004 Document Revised: 02/02/2016 Document Reviewed: 02/27/2013 Elsevier  Interactive Patient Education  2017 Elsevier Inc.  

## 2017-07-10 DIAGNOSIS — R339 Retention of urine, unspecified: Secondary | ICD-10-CM | POA: Diagnosis not present

## 2017-07-10 DIAGNOSIS — N390 Urinary tract infection, site not specified: Secondary | ICD-10-CM | POA: Diagnosis not present

## 2017-07-10 DIAGNOSIS — Z23 Encounter for immunization: Secondary | ICD-10-CM | POA: Diagnosis not present

## 2017-07-10 DIAGNOSIS — N319 Neuromuscular dysfunction of bladder, unspecified: Secondary | ICD-10-CM | POA: Diagnosis not present

## 2017-07-17 NOTE — Addendum Note (Signed)
Addended by: Lianne Cure A on: 07/17/2017 03:20 PM   Modules accepted: Orders

## 2017-09-16 DIAGNOSIS — E785 Hyperlipidemia, unspecified: Secondary | ICD-10-CM | POA: Diagnosis not present

## 2017-09-16 DIAGNOSIS — Z6831 Body mass index (BMI) 31.0-31.9, adult: Secondary | ICD-10-CM | POA: Diagnosis not present

## 2017-09-16 DIAGNOSIS — M8589 Other specified disorders of bone density and structure, multiple sites: Secondary | ICD-10-CM | POA: Diagnosis not present

## 2017-09-16 DIAGNOSIS — M810 Age-related osteoporosis without current pathological fracture: Secondary | ICD-10-CM | POA: Diagnosis not present

## 2017-09-16 DIAGNOSIS — E039 Hypothyroidism, unspecified: Secondary | ICD-10-CM | POA: Diagnosis not present

## 2017-09-16 DIAGNOSIS — I10 Essential (primary) hypertension: Secondary | ICD-10-CM | POA: Diagnosis not present

## 2017-09-16 DIAGNOSIS — E559 Vitamin D deficiency, unspecified: Secondary | ICD-10-CM | POA: Diagnosis not present

## 2017-09-16 DIAGNOSIS — Z1231 Encounter for screening mammogram for malignant neoplasm of breast: Secondary | ICD-10-CM | POA: Diagnosis not present

## 2017-09-16 DIAGNOSIS — R7301 Impaired fasting glucose: Secondary | ICD-10-CM | POA: Diagnosis not present

## 2017-09-16 DIAGNOSIS — N390 Urinary tract infection, site not specified: Secondary | ICD-10-CM | POA: Diagnosis not present

## 2017-09-16 DIAGNOSIS — I639 Cerebral infarction, unspecified: Secondary | ICD-10-CM | POA: Diagnosis not present

## 2017-10-07 DIAGNOSIS — N390 Urinary tract infection, site not specified: Secondary | ICD-10-CM | POA: Diagnosis not present

## 2017-10-07 DIAGNOSIS — N319 Neuromuscular dysfunction of bladder, unspecified: Secondary | ICD-10-CM | POA: Diagnosis not present

## 2017-10-07 DIAGNOSIS — R339 Retention of urine, unspecified: Secondary | ICD-10-CM | POA: Diagnosis not present

## 2017-10-11 DIAGNOSIS — Z1231 Encounter for screening mammogram for malignant neoplasm of breast: Secondary | ICD-10-CM | POA: Diagnosis not present

## 2017-10-16 DIAGNOSIS — M810 Age-related osteoporosis without current pathological fracture: Secondary | ICD-10-CM | POA: Diagnosis not present

## 2017-11-26 DIAGNOSIS — M810 Age-related osteoporosis without current pathological fracture: Secondary | ICD-10-CM | POA: Diagnosis not present

## 2017-11-26 DIAGNOSIS — E785 Hyperlipidemia, unspecified: Secondary | ICD-10-CM | POA: Diagnosis not present

## 2017-11-26 DIAGNOSIS — Z136 Encounter for screening for cardiovascular disorders: Secondary | ICD-10-CM | POA: Diagnosis not present

## 2017-11-26 DIAGNOSIS — Z1231 Encounter for screening mammogram for malignant neoplasm of breast: Secondary | ICD-10-CM | POA: Diagnosis not present

## 2017-11-26 DIAGNOSIS — Z0000 Encounter for general adult medical examination without abnormal findings: Secondary | ICD-10-CM | POA: Diagnosis not present

## 2017-11-26 DIAGNOSIS — Z9181 History of falling: Secondary | ICD-10-CM | POA: Diagnosis not present

## 2017-11-26 DIAGNOSIS — Z6830 Body mass index (BMI) 30.0-30.9, adult: Secondary | ICD-10-CM | POA: Diagnosis not present

## 2017-11-26 DIAGNOSIS — E669 Obesity, unspecified: Secondary | ICD-10-CM | POA: Diagnosis not present

## 2017-11-26 DIAGNOSIS — Z1331 Encounter for screening for depression: Secondary | ICD-10-CM | POA: Diagnosis not present

## 2017-12-03 DIAGNOSIS — J208 Acute bronchitis due to other specified organisms: Secondary | ICD-10-CM | POA: Diagnosis not present

## 2017-12-09 ENCOUNTER — Other Ambulatory Visit: Payer: Self-pay

## 2017-12-09 ENCOUNTER — Emergency Department (HOSPITAL_COMMUNITY)
Admission: EM | Admit: 2017-12-09 | Discharge: 2017-12-09 | Disposition: A | Discharge status: Home / Self Care | Payer: Medicare Other | Attending: Emergency Medicine | Admitting: Emergency Medicine

## 2017-12-09 DIAGNOSIS — Z7902 Long term (current) use of antithrombotics/antiplatelets: Secondary | ICD-10-CM | POA: Insufficient documentation

## 2017-12-09 DIAGNOSIS — I10 Essential (primary) hypertension: Secondary | ICD-10-CM | POA: Insufficient documentation

## 2017-12-09 DIAGNOSIS — E039 Hypothyroidism, unspecified: Secondary | ICD-10-CM | POA: Insufficient documentation

## 2017-12-09 DIAGNOSIS — Z79899 Other long term (current) drug therapy: Secondary | ICD-10-CM | POA: Diagnosis not present

## 2017-12-09 DIAGNOSIS — Z96652 Presence of left artificial knee joint: Secondary | ICD-10-CM | POA: Diagnosis not present

## 2017-12-09 DIAGNOSIS — Z8673 Personal history of transient ischemic attack (TIA), and cerebral infarction without residual deficits: Secondary | ICD-10-CM | POA: Diagnosis not present

## 2017-12-09 DIAGNOSIS — N8110 Cystocele, unspecified: Secondary | ICD-10-CM | POA: Diagnosis not present

## 2017-12-09 DIAGNOSIS — K625 Hemorrhage of anus and rectum: Secondary | ICD-10-CM | POA: Diagnosis not present

## 2017-12-09 DIAGNOSIS — K623 Rectal prolapse: Secondary | ICD-10-CM | POA: Diagnosis not present

## 2017-12-09 DIAGNOSIS — R339 Retention of urine, unspecified: Secondary | ICD-10-CM | POA: Diagnosis not present

## 2017-12-09 DIAGNOSIS — R000 Tachycardia, unspecified: Secondary | ICD-10-CM | POA: Diagnosis not present

## 2017-12-09 DIAGNOSIS — R102 Pelvic and perineal pain: Secondary | ICD-10-CM | POA: Diagnosis not present

## 2017-12-09 DIAGNOSIS — Z96651 Presence of right artificial knee joint: Secondary | ICD-10-CM | POA: Diagnosis not present

## 2017-12-09 NOTE — ED Triage Notes (Signed)
Pt BIB [facility] for a rectal prolapse. Pt states she has never had that before. Started Friday and she attempted to reduce it without success. Pt from home.

## 2017-12-09 NOTE — Discharge Instructions (Signed)
You have a cystocele which is a protrusion of the bladder down through the vaginal wall. Please follow up with your urologist Dr. Nila Nephew or OB/GYN at [facility] hospital. Return to ED is you develop severe pain or bleeding, or prolapse does not reduce when you lay down as it did today.

## 2017-12-09 NOTE — ED Provider Notes (Signed)
[place] [facility] EMERGENCY DEPARTMENT Provider Note   CSN: [id number] Arrival date & time: 12/09/17  0607     History   Chief Complaint Chief Complaint  Patient presents with  . Foreign Body in Rectum    HPI [name] is a 84 y.o. female.  [name] is a 84 y.o. Female with history of hypertension, hypothyroidism, hyperlipidemia, previous stroke, and previous bladder suspension in 2012, who presents to the ED for concerns of rectal prolapse.  Patient reports she first noticed a protrusion in her genital area on Saturday, that was small, but got larger after standing for long periods of time or having a bowel movement.  Patient was unsure if the area was coming from her rectum or vagina.  She reports at baseline she is often incontinent of urine she denies any dysuria or hematuria.  Patient reports this morning after trying to have a bowel movement she noticed the area became much larger and she was concerned it would not reduce as it had been over the weekend so she came via EMS for evaluation.  Patient denies any abdominal pain, no bleeding from the area, no fevers or chills.  She reports she did have to have a bladder repair several years ago with her urologist in [place], [place], she was planning to follow-up with him today, but became concerned this morning when the area was larger and not going away.  She denies pain,, no vaginal discharge or vaginal bleeding.     Past Medical History:  Diagnosis Date  . Anemia   . Arthritis    "was in my knees"  . High cholesterol   . History of blood transfusion    "S/P knee OR"  . Hypertension   . Hypothyroidism   . Pneumonia    "maybe in my teens"  . Recurrent UTI (urinary tract infection)   . Stroke [facility]) 1990's   "mild", denies residual on 01/28/2014    Patient Active Problem List   Diagnosis Date Noted  . Acute ischemic right MCA stroke ([place]) 01/23/2016  . Gait disturbance, post-stroke   .  Hemiparesis ([place])   . Essential hypertension   . Dyslipidemia   . Thyroid activity decreased   . E-coli UTI   . Dyspepsia   . Dehydration   . Acute blood loss anemia   . Intracranial vascular stenosis   . Ischemic stroke ([place]) 01/19/2016  . Recurrent UTI 01/19/2016  . HLD (hyperlipidemia) 01/19/2016  . Intertrochanteric fracture of right hip ([place]) 03/23/2014  . HTN (hypertension) 03/23/2014  . Hypothyroidism 03/23/2014  . Anemia 03/23/2014  . Thrombocytopenia, unspecified ([employer]) 03/23/2014  . Closed right hip fracture ([place]) 03/23/2014  . Pseudoaneurysm of right femoral artery ([place]) 01/31/2014  . Pseudoaneurysm of femoral artery ([place]) 01/30/2014  . Splenic artery aneurysm ([place]) 12/04/2013    Past Surgical History:  Procedure Laterality Date  . BLADDER SUSPENSION  2012  . EMBOLIZATION N/A 01/28/2014   Procedure: EMBOLIZATION;  Surgeon: Conrad Marion, MD;  Location: [facility] CATH LAB;  Service: Cardiovascular;  Laterality: N/A;  Splenic Artery  . FEMUR IM NAIL Right 03/23/2014   Procedure: INTRAMEDULLARY (IM) NAIL FEMORAL;  Surgeon: Mauri Pole, MD;  Location: [facility] ORS;  Service: Orthopedics;  Laterality: Right;  . JOINT REPLACEMENT    . MUSCLE BIOPSY Left 1990's   "knot biopsy from carrying mail bag for years"  . REVISON OF ARTERIOVENOUS FISTULA Right 01/31/2014   Procedure: REPAIR PSEUDOANEURYSM;  Surgeon: Serafina Mitchell,  MD;  Location: [facility] OR;  Service: Vascular;  Laterality: Right;  Repair of Femoral Pseudoaneurysm.  . SPLENIC ARTERY EMBOLIZATION  01/28/2014  . TOTAL KNEE ARTHROPLASTY Bilateral 2012  . VAGINAL HYSTERECTOMY    . VISCERAL ANGIOGRAM N/A 01/28/2014   Procedure: VISCERAL ANGIOGRAM;  Surgeon: Conrad Amagansett, MD;  Location: [facility] CATH LAB;  Service: Cardiovascular;  Laterality: N/A;     OB History   None      Home Medications    Prior to Admission medications   Medication Sig Start Date End Date Taking? Authorizing Provider  bethanechol (URECHOLINE) 50 MG tablet Take 50  mg by mouth 2 (two) times daily.    [provider]  Calcium Carbonate-Vitamin D (CALCIUM 500 + D PO) Take 500 mg by mouth 2 (two) times daily.    [provider]  cholecalciferol (VITAMIN D) 1000 UNITS tablet Take 2,000 Units by mouth daily.    [provider]  clopidogrel (PLAVIX) 75 MG tablet Take 1 tablet (75 mg total) by mouth daily. 02/01/16   Love, Ivan Anchors, PA-C  Cranberry-Vitamin C-Vitamin E (CRANBERRY PLUS VITAMIN C) 140-100-3 MG-MG-UNIT CAPS Take 2,000 Units by mouth every morning.     [provider]  D-Mannose POWD Take by mouth.    [provider]  ezetimibe (ZETIA) 10 MG tablet Take 1 tablet (10 mg total) by mouth at bedtime. 02/01/16   Love, Ivan Anchors, PA-C  famotidine (PEPCID) 10 MG tablet Take 1 tablet (10 mg total) by mouth 2 (two) times daily. Patient taking differently: Take 10 mg by mouth 2 (two) times daily as needed.  02/01/16   Love, Ivan Anchors, PA-C  folic acid (FOLVITE) 734 MCG tablet Take 400 mcg by mouth daily.    [provider]  labetalol (NORMODYNE) 200 MG tablet Take 1.5 tablets (300 mg total) by mouth 2 (two) times daily. 02/01/16   Love, Ivan Anchors, PA-C  lactose free nutrition (BOOST) LIQD Take 237 mLs by mouth 3 (three) times daily as needed (meal replacement).    [provider]  levothyroxine (SYNTHROID, LEVOTHROID) 50 MCG tablet Take 50 mcg by mouth daily before breakfast.    [provider]  lisinopril (PRINIVIL,ZESTRIL) 40 MG tablet Take 1 tablet (40 mg total) by mouth at bedtime. 02/01/16   Love, Ivan Anchors, PA-C  Omega-3 Fatty Acids (FISH OIL) 1000 MG CAPS Take 1,000 mg by mouth 2 (two) times daily.    [provider]  pravastatin (PRAVACHOL) 20 MG tablet Take 1 tablet (20 mg total) by mouth daily. 02/01/16   Bary Leriche, PA-C    Family History Family History  Problem Relation Age of Onset  . Hypertension Son   . Heart attack Neg Hx     Social History Social History   Tobacco  Use  . Smoking status: Never Smoker  . Smokeless tobacco: Never Used  Substance Use Topics  . Alcohol use: No    Alcohol/week: 0.0 oz  . Drug use: No     Allergies   Aspirin   Review of Systems Review of Systems  Constitutional: Negative for chills and fever.  Respiratory: Negative for shortness of breath.   Cardiovascular: Negative for chest pain.  Gastrointestinal: Negative for abdominal pain, anal bleeding, nausea, rectal pain and vomiting.  Genitourinary: Negative for difficulty urinating, dysuria, frequency, hematuria, pelvic pain, vaginal bleeding and vaginal discharge.  Musculoskeletal: Negative for arthralgias and myalgias.  Skin: Negative for color change and rash.  Neurological: Negative for dizziness, syncope  and light-headedness.     Physical Exam Updated Vital Signs There were no vitals taken for this visit.  Physical Exam  Constitutional: She appears well-developed and well-nourished. No distress.  Sitting comfortably in no acute distress  HENT:  Head: Normocephalic and atraumatic.  Eyes: Right eye exhibits no discharge. Left eye exhibits no discharge.  Neck: Neck supple.  Cardiovascular: Normal rate, regular rhythm and normal heart sounds.  Pulmonary/Chest: Effort normal and breath sounds normal. No stridor. No respiratory distress. She has no wheezes. She has no rales.  Abdominal: Soft. Bowel sounds are normal. She exhibits no distension. There is no tenderness. There is no guarding.  Genitourinary:     Genitourinary Comments: Cystocele present at the vaginal opening, no evidence of rectal prolapse or rectal bleeding  Neurological: She is alert. Coordination normal.  Skin: Skin is warm and dry. Capillary refill takes less than 2 seconds. She is not diaphoretic.  Psychiatric: She has a normal mood and affect. Her behavior is normal.  Nursing note and vitals reviewed.    ED Treatments / Results  Labs (all labs ordered are listed, but only abnormal  results are displayed) Labs Reviewed - No data to display  EKG None  Radiology No results found.  Procedures Procedures (including critical care time)  Medications Ordered in ED Medications - No data to display   Initial Impression / Assessment and Plan / ED Course  I have reviewed the triage vital signs and the nursing notes.  Pertinent labs & imaging results that were available during my care of the patient were reviewed by me and considered in my medical decision making (see chart for details).  Pt presents with concern for rectal prolapse, she reports she first noticed this on Saturday, reports it got larger after a bowel movement this morning. On exam pt has apparent cystocele, without significant prolapse. Pt has hx of previous repair several years ago. There is no evidence of rectal prolapse. No bleeding. Abdomen soft. Given that cystocele seems to be easily reducible, feel pt is stable for outpatient follow up, Pt plans to follow up with Dr. Nila Nephew with her urologist, also gave follow with OB/GYN if needed. Return precautions discussed. Pt expresses understanding and is in agreement with plan.  Patient discussed with Dr. Stark Jock, who saw patient as well and agrees with plan.   Final Clinical Impressions(s) / ED Diagnoses   Final diagnoses:  Female cystocele    ED Discharge Orders    None       Jacqlyn Larsen, [place] 12/09/17 Wonda Amis    Veryl Speak, MD 12/10/17 [phone]

## 2017-12-10 DIAGNOSIS — K623 Rectal prolapse: Secondary | ICD-10-CM | POA: Diagnosis not present

## 2017-12-10 DIAGNOSIS — Z79899 Other long term (current) drug therapy: Secondary | ICD-10-CM | POA: Diagnosis not present

## 2017-12-11 DIAGNOSIS — K623 Rectal prolapse: Secondary | ICD-10-CM | POA: Diagnosis not present

## 2017-12-11 DIAGNOSIS — I10 Essential (primary) hypertension: Secondary | ICD-10-CM | POA: Diagnosis not present

## 2017-12-11 DIAGNOSIS — Z8673 Personal history of transient ischemic attack (TIA), and cerebral infarction without residual deficits: Secondary | ICD-10-CM | POA: Diagnosis not present

## 2017-12-17 DIAGNOSIS — I517 Cardiomegaly: Secondary | ICD-10-CM | POA: Diagnosis not present

## 2017-12-17 DIAGNOSIS — I491 Atrial premature depolarization: Secondary | ICD-10-CM | POA: Diagnosis not present

## 2017-12-20 DIAGNOSIS — I6523 Occlusion and stenosis of bilateral carotid arteries: Secondary | ICD-10-CM | POA: Diagnosis not present

## 2017-12-20 DIAGNOSIS — R29818 Other symptoms and signs involving the nervous system: Secondary | ICD-10-CM | POA: Diagnosis not present

## 2017-12-20 DIAGNOSIS — G8911 Acute pain due to trauma: Secondary | ICD-10-CM | POA: Diagnosis not present

## 2017-12-20 DIAGNOSIS — I959 Hypotension, unspecified: Secondary | ICD-10-CM | POA: Diagnosis not present

## 2017-12-20 DIAGNOSIS — R29810 Facial weakness: Secondary | ICD-10-CM | POA: Diagnosis not present

## 2017-12-20 DIAGNOSIS — I6789 Other cerebrovascular disease: Secondary | ICD-10-CM | POA: Diagnosis not present

## 2017-12-20 DIAGNOSIS — E785 Hyperlipidemia, unspecified: Secondary | ICD-10-CM | POA: Diagnosis not present

## 2017-12-20 DIAGNOSIS — K625 Hemorrhage of anus and rectum: Secondary | ICD-10-CM | POA: Diagnosis present

## 2017-12-20 DIAGNOSIS — I6389 Other cerebral infarction: Secondary | ICD-10-CM | POA: Diagnosis not present

## 2017-12-20 DIAGNOSIS — Z8673 Personal history of transient ischemic attack (TIA), and cerebral infarction without residual deficits: Secondary | ICD-10-CM | POA: Diagnosis not present

## 2017-12-20 DIAGNOSIS — I639 Cerebral infarction, unspecified: Secondary | ICD-10-CM | POA: Diagnosis not present

## 2017-12-20 DIAGNOSIS — K623 Rectal prolapse: Secondary | ICD-10-CM | POA: Diagnosis not present

## 2017-12-20 DIAGNOSIS — K921 Melena: Secondary | ICD-10-CM | POA: Diagnosis not present

## 2017-12-20 DIAGNOSIS — D62 Acute posthemorrhagic anemia: Secondary | ICD-10-CM | POA: Diagnosis not present

## 2017-12-20 DIAGNOSIS — I517 Cardiomegaly: Secondary | ICD-10-CM | POA: Diagnosis not present

## 2017-12-20 DIAGNOSIS — I10 Essential (primary) hypertension: Secondary | ICD-10-CM | POA: Diagnosis not present

## 2017-12-20 DIAGNOSIS — G8190 Hemiplegia, unspecified affecting unspecified side: Secondary | ICD-10-CM | POA: Diagnosis present

## 2017-12-20 DIAGNOSIS — I491 Atrial premature depolarization: Secondary | ICD-10-CM | POA: Diagnosis not present

## 2017-12-20 DIAGNOSIS — E039 Hypothyroidism, unspecified: Secondary | ICD-10-CM | POA: Diagnosis not present

## 2017-12-20 DIAGNOSIS — R918 Other nonspecific abnormal finding of lung field: Secondary | ICD-10-CM | POA: Diagnosis not present

## 2017-12-20 DIAGNOSIS — Z9889 Other specified postprocedural states: Secondary | ICD-10-CM | POA: Diagnosis not present

## 2017-12-20 DIAGNOSIS — R531 Weakness: Secondary | ICD-10-CM | POA: Diagnosis not present

## 2017-12-25 DIAGNOSIS — I6523 Occlusion and stenosis of bilateral carotid arteries: Secondary | ICD-10-CM | POA: Diagnosis not present

## 2017-12-25 DIAGNOSIS — I63511 Cerebral infarction due to unspecified occlusion or stenosis of right middle cerebral artery: Secondary | ICD-10-CM | POA: Diagnosis not present

## 2017-12-25 DIAGNOSIS — I639 Cerebral infarction, unspecified: Secondary | ICD-10-CM | POA: Diagnosis not present

## 2017-12-25 DIAGNOSIS — I10 Essential (primary) hypertension: Secondary | ICD-10-CM | POA: Diagnosis not present

## 2017-12-25 DIAGNOSIS — R29718 NIHSS score 18: Secondary | ICD-10-CM | POA: Diagnosis not present

## 2017-12-25 DIAGNOSIS — I6389 Other cerebral infarction: Secondary | ICD-10-CM | POA: Diagnosis not present

## 2017-12-25 DIAGNOSIS — R918 Other nonspecific abnormal finding of lung field: Secondary | ICD-10-CM | POA: Diagnosis not present

## 2017-12-25 DIAGNOSIS — R29818 Other symptoms and signs involving the nervous system: Secondary | ICD-10-CM | POA: Diagnosis not present

## 2017-12-26 DIAGNOSIS — J15212 Pneumonia due to Methicillin resistant Staphylococcus aureus: Secondary | ICD-10-CM | POA: Diagnosis not present

## 2017-12-26 DIAGNOSIS — E785 Hyperlipidemia, unspecified: Secondary | ICD-10-CM | POA: Diagnosis not present

## 2017-12-26 DIAGNOSIS — E7800 Pure hypercholesterolemia, unspecified: Secondary | ICD-10-CM | POA: Diagnosis not present

## 2017-12-26 DIAGNOSIS — I10 Essential (primary) hypertension: Secondary | ICD-10-CM | POA: Diagnosis not present

## 2017-12-26 DIAGNOSIS — Z9889 Other specified postprocedural states: Secondary | ICD-10-CM | POA: Diagnosis not present

## 2017-12-26 DIAGNOSIS — Z66 Do not resuscitate: Secondary | ICD-10-CM | POA: Diagnosis present

## 2017-12-26 DIAGNOSIS — I639 Cerebral infarction, unspecified: Secondary | ICD-10-CM | POA: Diagnosis not present

## 2017-12-26 DIAGNOSIS — G935 Compression of brain: Secondary | ICD-10-CM | POA: Diagnosis not present

## 2017-12-26 DIAGNOSIS — J9811 Atelectasis: Secondary | ICD-10-CM | POA: Diagnosis not present

## 2017-12-26 DIAGNOSIS — A4102 Sepsis due to Methicillin resistant Staphylococcus aureus: Secondary | ICD-10-CM | POA: Diagnosis not present

## 2017-12-26 DIAGNOSIS — Z4682 Encounter for fitting and adjustment of non-vascular catheter: Secondary | ICD-10-CM | POA: Diagnosis not present

## 2017-12-26 DIAGNOSIS — I69354 Hemiplegia and hemiparesis following cerebral infarction affecting left non-dominant side: Secondary | ICD-10-CM | POA: Diagnosis not present

## 2017-12-26 DIAGNOSIS — H538 Other visual disturbances: Secondary | ICD-10-CM | POA: Diagnosis present

## 2017-12-26 DIAGNOSIS — R1312 Dysphagia, oropharyngeal phase: Secondary | ICD-10-CM | POA: Diagnosis not present

## 2017-12-26 DIAGNOSIS — I672 Cerebral atherosclerosis: Secondary | ICD-10-CM | POA: Diagnosis present

## 2017-12-26 DIAGNOSIS — G936 Cerebral edema: Secondary | ICD-10-CM | POA: Diagnosis not present

## 2017-12-26 DIAGNOSIS — J690 Pneumonitis due to inhalation of food and vomit: Secondary | ICD-10-CM | POA: Diagnosis not present

## 2017-12-26 DIAGNOSIS — R05 Cough: Secondary | ICD-10-CM | POA: Diagnosis not present

## 2017-12-26 DIAGNOSIS — G8194 Hemiplegia, unspecified affecting left nondominant side: Secondary | ICD-10-CM | POA: Diagnosis not present

## 2017-12-26 DIAGNOSIS — D508 Other iron deficiency anemias: Secondary | ICD-10-CM | POA: Diagnosis not present

## 2017-12-26 DIAGNOSIS — I350 Nonrheumatic aortic (valve) stenosis: Secondary | ICD-10-CM | POA: Diagnosis present

## 2017-12-26 DIAGNOSIS — J984 Other disorders of lung: Secondary | ICD-10-CM | POA: Diagnosis not present

## 2017-12-26 DIAGNOSIS — I63421 Cerebral infarction due to embolism of right anterior cerebral artery: Secondary | ICD-10-CM | POA: Diagnosis not present

## 2017-12-26 DIAGNOSIS — J189 Pneumonia, unspecified organism: Secondary | ICD-10-CM | POA: Diagnosis not present

## 2017-12-26 DIAGNOSIS — E039 Hypothyroidism, unspecified: Secondary | ICD-10-CM | POA: Diagnosis not present

## 2017-12-26 DIAGNOSIS — R29718 NIHSS score 18: Secondary | ICD-10-CM | POA: Diagnosis not present

## 2017-12-26 DIAGNOSIS — I63411 Cerebral infarction due to embolism of right middle cerebral artery: Secondary | ICD-10-CM | POA: Diagnosis not present

## 2017-12-26 DIAGNOSIS — I63233 Cerebral infarction due to unspecified occlusion or stenosis of bilateral carotid arteries: Secondary | ICD-10-CM | POA: Diagnosis not present

## 2017-12-26 DIAGNOSIS — E870 Hyperosmolality and hypernatremia: Secondary | ICD-10-CM | POA: Diagnosis not present

## 2017-12-26 DIAGNOSIS — I69391 Dysphagia following cerebral infarction: Secondary | ICD-10-CM | POA: Diagnosis not present

## 2017-12-26 DIAGNOSIS — E871 Hypo-osmolality and hyponatremia: Secondary | ICD-10-CM | POA: Diagnosis not present

## 2017-12-26 DIAGNOSIS — I610 Nontraumatic intracerebral hemorrhage in hemisphere, subcortical: Secondary | ICD-10-CM | POA: Diagnosis not present

## 2017-12-26 DIAGNOSIS — R5381 Other malaise: Secondary | ICD-10-CM | POA: Diagnosis not present

## 2017-12-26 DIAGNOSIS — I63511 Cerebral infarction due to unspecified occlusion or stenosis of right middle cerebral artery: Secondary | ICD-10-CM | POA: Diagnosis not present

## 2017-12-26 DIAGNOSIS — R29810 Facial weakness: Secondary | ICD-10-CM | POA: Diagnosis present

## 2017-12-26 DIAGNOSIS — I480 Paroxysmal atrial fibrillation: Secondary | ICD-10-CM | POA: Diagnosis not present

## 2017-12-26 DIAGNOSIS — R471 Dysarthria and anarthria: Secondary | ICD-10-CM | POA: Diagnosis present

## 2017-12-26 DIAGNOSIS — R509 Fever, unspecified: Secondary | ICD-10-CM | POA: Diagnosis not present

## 2017-12-26 DIAGNOSIS — I080 Rheumatic disorders of both mitral and aortic valves: Secondary | ICD-10-CM | POA: Diagnosis not present

## 2017-12-26 DIAGNOSIS — R29717 NIHSS score 17: Secondary | ICD-10-CM | POA: Diagnosis present

## 2017-12-26 DIAGNOSIS — A419 Sepsis, unspecified organism: Secondary | ICD-10-CM | POA: Diagnosis not present

## 2017-12-26 DIAGNOSIS — R1310 Dysphagia, unspecified: Secondary | ICD-10-CM | POA: Diagnosis not present

## 2017-12-26 DIAGNOSIS — I6389 Other cerebral infarction: Secondary | ICD-10-CM | POA: Diagnosis not present

## 2017-12-26 DIAGNOSIS — Z7901 Long term (current) use of anticoagulants: Secondary | ICD-10-CM | POA: Diagnosis not present

## 2017-12-26 DIAGNOSIS — I517 Cardiomegaly: Secondary | ICD-10-CM | POA: Diagnosis not present

## 2017-12-26 DIAGNOSIS — Z7409 Other reduced mobility: Secondary | ICD-10-CM | POA: Diagnosis not present

## 2017-12-26 DIAGNOSIS — G839 Paralytic syndrome, unspecified: Secondary | ICD-10-CM | POA: Diagnosis not present

## 2017-12-26 DIAGNOSIS — Z452 Encounter for adjustment and management of vascular access device: Secondary | ICD-10-CM | POA: Diagnosis not present

## 2017-12-26 DIAGNOSIS — R633 Feeding difficulties: Secondary | ICD-10-CM | POA: Diagnosis not present

## 2018-01-11 DIAGNOSIS — Z7901 Long term (current) use of anticoagulants: Secondary | ICD-10-CM | POA: Diagnosis not present

## 2018-01-11 DIAGNOSIS — Z8701 Personal history of pneumonia (recurrent): Secondary | ICD-10-CM | POA: Diagnosis not present

## 2018-01-11 DIAGNOSIS — I69354 Hemiplegia and hemiparesis following cerebral infarction affecting left non-dominant side: Secondary | ICD-10-CM | POA: Diagnosis not present

## 2018-01-11 DIAGNOSIS — R1312 Dysphagia, oropharyngeal phase: Secondary | ICD-10-CM | POA: Diagnosis not present

## 2018-01-11 DIAGNOSIS — Z9181 History of falling: Secondary | ICD-10-CM | POA: Diagnosis not present

## 2018-01-11 DIAGNOSIS — Z431 Encounter for attention to gastrostomy: Secondary | ICD-10-CM | POA: Diagnosis not present

## 2018-01-11 DIAGNOSIS — I10 Essential (primary) hypertension: Secondary | ICD-10-CM | POA: Diagnosis not present

## 2018-01-11 DIAGNOSIS — R32 Unspecified urinary incontinence: Secondary | ICD-10-CM | POA: Diagnosis not present

## 2018-01-11 DIAGNOSIS — I69391 Dysphagia following cerebral infarction: Secondary | ICD-10-CM | POA: Diagnosis not present

## 2018-01-13 DIAGNOSIS — Z431 Encounter for attention to gastrostomy: Secondary | ICD-10-CM | POA: Diagnosis not present

## 2018-01-13 DIAGNOSIS — I10 Essential (primary) hypertension: Secondary | ICD-10-CM | POA: Diagnosis not present

## 2018-01-13 DIAGNOSIS — R32 Unspecified urinary incontinence: Secondary | ICD-10-CM | POA: Diagnosis not present

## 2018-01-13 DIAGNOSIS — I69354 Hemiplegia and hemiparesis following cerebral infarction affecting left non-dominant side: Secondary | ICD-10-CM | POA: Diagnosis not present

## 2018-01-13 DIAGNOSIS — I69391 Dysphagia following cerebral infarction: Secondary | ICD-10-CM | POA: Diagnosis not present

## 2018-01-13 DIAGNOSIS — R1312 Dysphagia, oropharyngeal phase: Secondary | ICD-10-CM | POA: Diagnosis not present

## 2018-01-14 DIAGNOSIS — E860 Dehydration: Secondary | ICD-10-CM | POA: Diagnosis not present

## 2018-01-14 DIAGNOSIS — F419 Anxiety disorder, unspecified: Secondary | ICD-10-CM | POA: Diagnosis not present

## 2018-01-14 DIAGNOSIS — Z79899 Other long term (current) drug therapy: Secondary | ICD-10-CM | POA: Diagnosis not present

## 2018-01-14 DIAGNOSIS — D6489 Other specified anemias: Secondary | ICD-10-CM | POA: Diagnosis not present

## 2018-01-14 DIAGNOSIS — E785 Hyperlipidemia, unspecified: Secondary | ICD-10-CM | POA: Diagnosis not present

## 2018-01-14 DIAGNOSIS — R531 Weakness: Secondary | ICD-10-CM | POA: Diagnosis not present

## 2018-01-14 DIAGNOSIS — I639 Cerebral infarction, unspecified: Secondary | ICD-10-CM | POA: Diagnosis not present

## 2018-01-14 DIAGNOSIS — Z431 Encounter for attention to gastrostomy: Secondary | ICD-10-CM | POA: Diagnosis not present

## 2018-01-14 DIAGNOSIS — J449 Chronic obstructive pulmonary disease, unspecified: Secondary | ICD-10-CM | POA: Diagnosis not present

## 2018-01-14 DIAGNOSIS — R778 Other specified abnormalities of plasma proteins: Secondary | ICD-10-CM | POA: Diagnosis not present

## 2018-01-14 DIAGNOSIS — R748 Abnormal levels of other serum enzymes: Secondary | ICD-10-CM | POA: Diagnosis not present

## 2018-01-14 DIAGNOSIS — I10 Essential (primary) hypertension: Secondary | ICD-10-CM | POA: Diagnosis not present

## 2018-01-14 DIAGNOSIS — M25511 Pain in right shoulder: Secondary | ICD-10-CM | POA: Diagnosis not present

## 2018-01-14 DIAGNOSIS — I4891 Unspecified atrial fibrillation: Secondary | ICD-10-CM | POA: Diagnosis not present

## 2018-01-14 DIAGNOSIS — I69354 Hemiplegia and hemiparesis following cerebral infarction affecting left non-dominant side: Secondary | ICD-10-CM | POA: Diagnosis not present

## 2018-01-14 DIAGNOSIS — R0602 Shortness of breath: Secondary | ICD-10-CM | POA: Diagnosis not present

## 2018-01-14 DIAGNOSIS — I69391 Dysphagia following cerebral infarction: Secondary | ICD-10-CM | POA: Diagnosis not present

## 2018-01-14 DIAGNOSIS — Z7901 Long term (current) use of anticoagulants: Secondary | ICD-10-CM | POA: Diagnosis not present

## 2018-01-14 DIAGNOSIS — E039 Hypothyroidism, unspecified: Secondary | ICD-10-CM | POA: Diagnosis not present

## 2018-01-14 DIAGNOSIS — E875 Hyperkalemia: Secondary | ICD-10-CM | POA: Diagnosis not present

## 2018-01-14 DIAGNOSIS — I6789 Other cerebrovascular disease: Secondary | ICD-10-CM | POA: Diagnosis not present

## 2018-01-14 DIAGNOSIS — R32 Unspecified urinary incontinence: Secondary | ICD-10-CM | POA: Diagnosis not present

## 2018-01-14 DIAGNOSIS — R1312 Dysphagia, oropharyngeal phase: Secondary | ICD-10-CM | POA: Diagnosis not present

## 2018-01-15 DIAGNOSIS — F330 Major depressive disorder, recurrent, mild: Secondary | ICD-10-CM | POA: Diagnosis not present

## 2018-01-15 DIAGNOSIS — I361 Nonrheumatic tricuspid (valve) insufficiency: Secondary | ICD-10-CM | POA: Diagnosis not present

## 2018-01-15 DIAGNOSIS — R293 Abnormal posture: Secondary | ICD-10-CM | POA: Diagnosis not present

## 2018-01-15 DIAGNOSIS — I6330 Cerebral infarction due to thrombosis of unspecified cerebral artery: Secondary | ICD-10-CM | POA: Diagnosis not present

## 2018-01-15 DIAGNOSIS — R0681 Apnea, not elsewhere classified: Secondary | ICD-10-CM | POA: Diagnosis not present

## 2018-01-15 DIAGNOSIS — R112 Nausea with vomiting, unspecified: Secondary | ICD-10-CM | POA: Diagnosis not present

## 2018-01-15 DIAGNOSIS — K219 Gastro-esophageal reflux disease without esophagitis: Secondary | ICD-10-CM | POA: Diagnosis not present

## 2018-01-15 DIAGNOSIS — E785 Hyperlipidemia, unspecified: Secondary | ICD-10-CM | POA: Diagnosis not present

## 2018-01-15 DIAGNOSIS — R0989 Other specified symptoms and signs involving the circulatory and respiratory systems: Secondary | ICD-10-CM | POA: Diagnosis not present

## 2018-01-15 DIAGNOSIS — I6359 Cerebral infarction due to unspecified occlusion or stenosis of other cerebral artery: Secondary | ICD-10-CM | POA: Diagnosis not present

## 2018-01-15 DIAGNOSIS — Z7401 Bed confinement status: Secondary | ICD-10-CM | POA: Diagnosis not present

## 2018-01-15 DIAGNOSIS — R748 Abnormal levels of other serum enzymes: Secondary | ICD-10-CM | POA: Diagnosis not present

## 2018-01-15 DIAGNOSIS — R1312 Dysphagia, oropharyngeal phase: Secondary | ICD-10-CM | POA: Diagnosis not present

## 2018-01-15 DIAGNOSIS — N390 Urinary tract infection, site not specified: Secondary | ICD-10-CM | POA: Diagnosis not present

## 2018-01-15 DIAGNOSIS — I69354 Hemiplegia and hemiparesis following cerebral infarction affecting left non-dominant side: Secondary | ICD-10-CM | POA: Diagnosis not present

## 2018-01-15 DIAGNOSIS — D649 Anemia, unspecified: Secondary | ICD-10-CM | POA: Diagnosis not present

## 2018-01-15 DIAGNOSIS — R262 Difficulty in walking, not elsewhere classified: Secondary | ICD-10-CM | POA: Diagnosis not present

## 2018-01-15 DIAGNOSIS — D638 Anemia in other chronic diseases classified elsewhere: Secondary | ICD-10-CM | POA: Diagnosis not present

## 2018-01-15 DIAGNOSIS — I10 Essential (primary) hypertension: Secondary | ICD-10-CM | POA: Diagnosis not present

## 2018-01-15 DIAGNOSIS — R0602 Shortness of breath: Secondary | ICD-10-CM | POA: Diagnosis not present

## 2018-01-15 DIAGNOSIS — E039 Hypothyroidism, unspecified: Secondary | ICD-10-CM | POA: Diagnosis not present

## 2018-01-15 DIAGNOSIS — M79606 Pain in leg, unspecified: Secondary | ICD-10-CM | POA: Diagnosis not present

## 2018-01-15 DIAGNOSIS — R279 Unspecified lack of coordination: Secondary | ICD-10-CM | POA: Diagnosis not present

## 2018-01-15 DIAGNOSIS — I4891 Unspecified atrial fibrillation: Secondary | ICD-10-CM | POA: Diagnosis not present

## 2018-01-15 DIAGNOSIS — I6980 Unspecified sequelae of other cerebrovascular disease: Secondary | ICD-10-CM | POA: Diagnosis not present

## 2018-01-15 DIAGNOSIS — R05 Cough: Secondary | ICD-10-CM | POA: Diagnosis not present

## 2018-01-15 DIAGNOSIS — E860 Dehydration: Secondary | ICD-10-CM | POA: Diagnosis not present

## 2018-01-15 DIAGNOSIS — M25511 Pain in right shoulder: Secondary | ICD-10-CM | POA: Diagnosis not present

## 2018-01-15 DIAGNOSIS — F419 Anxiety disorder, unspecified: Secondary | ICD-10-CM | POA: Diagnosis not present

## 2018-01-15 DIAGNOSIS — E875 Hyperkalemia: Secondary | ICD-10-CM | POA: Diagnosis not present

## 2018-01-15 DIAGNOSIS — K922 Gastrointestinal hemorrhage, unspecified: Secondary | ICD-10-CM | POA: Diagnosis not present

## 2018-01-15 DIAGNOSIS — I639 Cerebral infarction, unspecified: Secondary | ICD-10-CM | POA: Diagnosis not present

## 2018-01-15 DIAGNOSIS — R400 Somnolence: Secondary | ICD-10-CM | POA: Diagnosis not present

## 2018-01-15 DIAGNOSIS — R488 Other symbolic dysfunctions: Secondary | ICD-10-CM | POA: Diagnosis not present

## 2018-01-15 DIAGNOSIS — M6281 Muscle weakness (generalized): Secondary | ICD-10-CM | POA: Diagnosis not present

## 2018-01-15 DIAGNOSIS — Z931 Gastrostomy status: Secondary | ICD-10-CM | POA: Diagnosis not present

## 2018-01-15 DIAGNOSIS — R531 Weakness: Secondary | ICD-10-CM | POA: Diagnosis not present

## 2018-01-16 DIAGNOSIS — I10 Essential (primary) hypertension: Secondary | ICD-10-CM | POA: Diagnosis not present

## 2018-01-16 DIAGNOSIS — I6359 Cerebral infarction due to unspecified occlusion or stenosis of other cerebral artery: Secondary | ICD-10-CM | POA: Diagnosis not present

## 2018-01-16 DIAGNOSIS — I4891 Unspecified atrial fibrillation: Secondary | ICD-10-CM | POA: Diagnosis not present

## 2018-01-16 DIAGNOSIS — D638 Anemia in other chronic diseases classified elsewhere: Secondary | ICD-10-CM | POA: Diagnosis not present

## 2018-01-20 DIAGNOSIS — E039 Hypothyroidism, unspecified: Secondary | ICD-10-CM | POA: Diagnosis not present

## 2018-01-20 DIAGNOSIS — I10 Essential (primary) hypertension: Secondary | ICD-10-CM | POA: Diagnosis not present

## 2018-01-20 DIAGNOSIS — D638 Anemia in other chronic diseases classified elsewhere: Secondary | ICD-10-CM | POA: Diagnosis not present

## 2018-01-20 DIAGNOSIS — I4891 Unspecified atrial fibrillation: Secondary | ICD-10-CM | POA: Diagnosis not present

## 2018-01-22 DIAGNOSIS — I4891 Unspecified atrial fibrillation: Secondary | ICD-10-CM | POA: Diagnosis not present

## 2018-01-22 DIAGNOSIS — I10 Essential (primary) hypertension: Secondary | ICD-10-CM | POA: Diagnosis not present

## 2018-01-22 DIAGNOSIS — M79606 Pain in leg, unspecified: Secondary | ICD-10-CM | POA: Diagnosis not present

## 2018-01-22 DIAGNOSIS — I6359 Cerebral infarction due to unspecified occlusion or stenosis of other cerebral artery: Secondary | ICD-10-CM | POA: Diagnosis not present

## 2018-01-28 DIAGNOSIS — D649 Anemia, unspecified: Secondary | ICD-10-CM | POA: Diagnosis not present

## 2018-01-31 DIAGNOSIS — I10 Essential (primary) hypertension: Secondary | ICD-10-CM | POA: Diagnosis not present

## 2018-01-31 DIAGNOSIS — I6359 Cerebral infarction due to unspecified occlusion or stenosis of other cerebral artery: Secondary | ICD-10-CM | POA: Diagnosis not present

## 2018-01-31 DIAGNOSIS — E039 Hypothyroidism, unspecified: Secondary | ICD-10-CM | POA: Diagnosis not present

## 2018-01-31 DIAGNOSIS — I4891 Unspecified atrial fibrillation: Secondary | ICD-10-CM | POA: Diagnosis not present

## 2018-02-07 DIAGNOSIS — I6359 Cerebral infarction due to unspecified occlusion or stenosis of other cerebral artery: Secondary | ICD-10-CM | POA: Diagnosis not present

## 2018-02-07 DIAGNOSIS — D638 Anemia in other chronic diseases classified elsewhere: Secondary | ICD-10-CM | POA: Diagnosis not present

## 2018-02-07 DIAGNOSIS — I4891 Unspecified atrial fibrillation: Secondary | ICD-10-CM | POA: Diagnosis not present

## 2018-02-07 DIAGNOSIS — I10 Essential (primary) hypertension: Secondary | ICD-10-CM | POA: Diagnosis not present

## 2018-02-10 DIAGNOSIS — K922 Gastrointestinal hemorrhage, unspecified: Secondary | ICD-10-CM | POA: Diagnosis not present

## 2018-02-12 DIAGNOSIS — I10 Essential (primary) hypertension: Secondary | ICD-10-CM | POA: Diagnosis not present

## 2018-02-12 DIAGNOSIS — I4891 Unspecified atrial fibrillation: Secondary | ICD-10-CM | POA: Diagnosis not present

## 2018-02-12 DIAGNOSIS — R0681 Apnea, not elsewhere classified: Secondary | ICD-10-CM | POA: Diagnosis not present

## 2018-02-12 DIAGNOSIS — R400 Somnolence: Secondary | ICD-10-CM | POA: Diagnosis not present

## 2018-02-14 DIAGNOSIS — I6359 Cerebral infarction due to unspecified occlusion or stenosis of other cerebral artery: Secondary | ICD-10-CM | POA: Diagnosis not present

## 2018-02-14 DIAGNOSIS — I4891 Unspecified atrial fibrillation: Secondary | ICD-10-CM | POA: Diagnosis not present

## 2018-02-14 DIAGNOSIS — N390 Urinary tract infection, site not specified: Secondary | ICD-10-CM | POA: Diagnosis not present

## 2018-02-14 DIAGNOSIS — E860 Dehydration: Secondary | ICD-10-CM | POA: Diagnosis not present

## 2018-02-25 DIAGNOSIS — I10 Essential (primary) hypertension: Secondary | ICD-10-CM | POA: Diagnosis not present

## 2018-02-25 DIAGNOSIS — E039 Hypothyroidism, unspecified: Secondary | ICD-10-CM | POA: Diagnosis not present

## 2018-02-25 DIAGNOSIS — I4891 Unspecified atrial fibrillation: Secondary | ICD-10-CM | POA: Diagnosis not present

## 2018-02-25 DIAGNOSIS — D638 Anemia in other chronic diseases classified elsewhere: Secondary | ICD-10-CM | POA: Diagnosis not present

## 2018-03-10 DIAGNOSIS — E785 Hyperlipidemia, unspecified: Secondary | ICD-10-CM | POA: Diagnosis not present

## 2018-03-10 DIAGNOSIS — J4530 Mild persistent asthma, uncomplicated: Secondary | ICD-10-CM | POA: Diagnosis not present

## 2018-03-10 DIAGNOSIS — I6359 Cerebral infarction due to unspecified occlusion or stenosis of other cerebral artery: Secondary | ICD-10-CM | POA: Diagnosis not present

## 2018-03-10 DIAGNOSIS — R262 Difficulty in walking, not elsewhere classified: Secondary | ICD-10-CM | POA: Diagnosis not present

## 2018-03-10 DIAGNOSIS — I6980 Unspecified sequelae of other cerebrovascular disease: Secondary | ICD-10-CM | POA: Diagnosis not present

## 2018-03-10 DIAGNOSIS — Z931 Gastrostomy status: Secondary | ICD-10-CM | POA: Diagnosis not present

## 2018-03-10 DIAGNOSIS — R600 Localized edema: Secondary | ICD-10-CM | POA: Diagnosis not present

## 2018-03-10 DIAGNOSIS — R1312 Dysphagia, oropharyngeal phase: Secondary | ICD-10-CM | POA: Diagnosis not present

## 2018-03-10 DIAGNOSIS — R293 Abnormal posture: Secondary | ICD-10-CM | POA: Diagnosis not present

## 2018-03-10 DIAGNOSIS — M19022 Primary osteoarthritis, left elbow: Secondary | ICD-10-CM | POA: Diagnosis not present

## 2018-03-10 DIAGNOSIS — K922 Gastrointestinal hemorrhage, unspecified: Secondary | ICD-10-CM | POA: Diagnosis not present

## 2018-03-10 DIAGNOSIS — K219 Gastro-esophageal reflux disease without esophagitis: Secondary | ICD-10-CM | POA: Diagnosis not present

## 2018-03-10 DIAGNOSIS — I4891 Unspecified atrial fibrillation: Secondary | ICD-10-CM | POA: Diagnosis not present

## 2018-03-10 DIAGNOSIS — R0600 Dyspnea, unspecified: Secondary | ICD-10-CM | POA: Diagnosis not present

## 2018-03-10 DIAGNOSIS — M79602 Pain in left arm: Secondary | ICD-10-CM | POA: Diagnosis not present

## 2018-03-10 DIAGNOSIS — D638 Anemia in other chronic diseases classified elsewhere: Secondary | ICD-10-CM | POA: Diagnosis not present

## 2018-03-10 DIAGNOSIS — G4733 Obstructive sleep apnea (adult) (pediatric): Secondary | ICD-10-CM | POA: Diagnosis not present

## 2018-03-10 DIAGNOSIS — F419 Anxiety disorder, unspecified: Secondary | ICD-10-CM | POA: Diagnosis not present

## 2018-03-10 DIAGNOSIS — N390 Urinary tract infection, site not specified: Secondary | ICD-10-CM | POA: Diagnosis not present

## 2018-03-10 DIAGNOSIS — E039 Hypothyroidism, unspecified: Secondary | ICD-10-CM | POA: Diagnosis not present

## 2018-03-10 DIAGNOSIS — M6281 Muscle weakness (generalized): Secondary | ICD-10-CM | POA: Diagnosis not present

## 2018-03-10 DIAGNOSIS — I10 Essential (primary) hypertension: Secondary | ICD-10-CM | POA: Diagnosis not present

## 2018-03-10 DIAGNOSIS — M79632 Pain in left forearm: Secondary | ICD-10-CM | POA: Diagnosis not present

## 2018-03-10 DIAGNOSIS — R279 Unspecified lack of coordination: Secondary | ICD-10-CM | POA: Diagnosis not present

## 2018-03-10 DIAGNOSIS — F330 Major depressive disorder, recurrent, mild: Secondary | ICD-10-CM | POA: Diagnosis not present

## 2018-03-12 DIAGNOSIS — D638 Anemia in other chronic diseases classified elsewhere: Secondary | ICD-10-CM | POA: Diagnosis not present

## 2018-03-12 DIAGNOSIS — I10 Essential (primary) hypertension: Secondary | ICD-10-CM | POA: Diagnosis not present

## 2018-03-12 DIAGNOSIS — I6359 Cerebral infarction due to unspecified occlusion or stenosis of other cerebral artery: Secondary | ICD-10-CM | POA: Diagnosis not present

## 2018-03-12 DIAGNOSIS — I4891 Unspecified atrial fibrillation: Secondary | ICD-10-CM | POA: Diagnosis not present

## 2018-03-18 DIAGNOSIS — R0600 Dyspnea, unspecified: Secondary | ICD-10-CM | POA: Diagnosis not present

## 2018-03-18 DIAGNOSIS — G4733 Obstructive sleep apnea (adult) (pediatric): Secondary | ICD-10-CM | POA: Diagnosis not present

## 2018-03-18 DIAGNOSIS — J4530 Mild persistent asthma, uncomplicated: Secondary | ICD-10-CM | POA: Diagnosis not present

## 2018-03-26 DIAGNOSIS — R600 Localized edema: Secondary | ICD-10-CM | POA: Diagnosis not present

## 2018-03-26 DIAGNOSIS — M79602 Pain in left arm: Secondary | ICD-10-CM | POA: Diagnosis not present

## 2018-03-29 DIAGNOSIS — E785 Hyperlipidemia, unspecified: Secondary | ICD-10-CM | POA: Diagnosis not present

## 2018-03-29 DIAGNOSIS — I4891 Unspecified atrial fibrillation: Secondary | ICD-10-CM | POA: Diagnosis not present

## 2018-03-29 DIAGNOSIS — I10 Essential (primary) hypertension: Secondary | ICD-10-CM | POA: Diagnosis not present

## 2018-03-29 DIAGNOSIS — D638 Anemia in other chronic diseases classified elsewhere: Secondary | ICD-10-CM | POA: Diagnosis not present

## 2018-04-07 DIAGNOSIS — I6359 Cerebral infarction due to unspecified occlusion or stenosis of other cerebral artery: Secondary | ICD-10-CM | POA: Diagnosis not present

## 2018-04-07 DIAGNOSIS — N390 Urinary tract infection, site not specified: Secondary | ICD-10-CM | POA: Diagnosis not present

## 2018-04-07 DIAGNOSIS — I4891 Unspecified atrial fibrillation: Secondary | ICD-10-CM | POA: Diagnosis not present

## 2018-04-07 DIAGNOSIS — I10 Essential (primary) hypertension: Secondary | ICD-10-CM | POA: Diagnosis not present

## 2018-04-08 DIAGNOSIS — N390 Urinary tract infection, site not specified: Secondary | ICD-10-CM | POA: Diagnosis not present

## 2018-04-10 DIAGNOSIS — I6359 Cerebral infarction due to unspecified occlusion or stenosis of other cerebral artery: Secondary | ICD-10-CM | POA: Diagnosis not present

## 2018-04-10 DIAGNOSIS — R293 Abnormal posture: Secondary | ICD-10-CM | POA: Diagnosis not present

## 2018-04-10 DIAGNOSIS — E039 Hypothyroidism, unspecified: Secondary | ICD-10-CM | POA: Diagnosis not present

## 2018-04-10 DIAGNOSIS — D638 Anemia in other chronic diseases classified elsewhere: Secondary | ICD-10-CM | POA: Diagnosis not present

## 2018-04-10 DIAGNOSIS — E785 Hyperlipidemia, unspecified: Secondary | ICD-10-CM | POA: Diagnosis not present

## 2018-04-10 DIAGNOSIS — I10 Essential (primary) hypertension: Secondary | ICD-10-CM | POA: Diagnosis not present

## 2018-04-10 DIAGNOSIS — R1312 Dysphagia, oropharyngeal phase: Secondary | ICD-10-CM | POA: Diagnosis not present

## 2018-04-10 DIAGNOSIS — Z931 Gastrostomy status: Secondary | ICD-10-CM | POA: Diagnosis not present

## 2018-04-10 DIAGNOSIS — K922 Gastrointestinal hemorrhage, unspecified: Secondary | ICD-10-CM | POA: Diagnosis not present

## 2018-04-10 DIAGNOSIS — R262 Difficulty in walking, not elsewhere classified: Secondary | ICD-10-CM | POA: Diagnosis not present

## 2018-04-10 DIAGNOSIS — M6281 Muscle weakness (generalized): Secondary | ICD-10-CM | POA: Diagnosis not present

## 2018-04-10 DIAGNOSIS — D649 Anemia, unspecified: Secondary | ICD-10-CM | POA: Diagnosis not present

## 2018-04-10 DIAGNOSIS — I4891 Unspecified atrial fibrillation: Secondary | ICD-10-CM | POA: Diagnosis not present

## 2018-04-10 DIAGNOSIS — I6980 Unspecified sequelae of other cerebrovascular disease: Secondary | ICD-10-CM | POA: Diagnosis not present

## 2018-04-10 DIAGNOSIS — R279 Unspecified lack of coordination: Secondary | ICD-10-CM | POA: Diagnosis not present

## 2018-04-10 DIAGNOSIS — F419 Anxiety disorder, unspecified: Secondary | ICD-10-CM | POA: Diagnosis not present

## 2018-04-10 DIAGNOSIS — K219 Gastro-esophageal reflux disease without esophagitis: Secondary | ICD-10-CM | POA: Diagnosis not present

## 2018-04-10 DIAGNOSIS — F330 Major depressive disorder, recurrent, mild: Secondary | ICD-10-CM | POA: Diagnosis not present

## 2018-04-21 DIAGNOSIS — E785 Hyperlipidemia, unspecified: Secondary | ICD-10-CM | POA: Diagnosis not present

## 2018-04-21 DIAGNOSIS — I4891 Unspecified atrial fibrillation: Secondary | ICD-10-CM | POA: Diagnosis not present

## 2018-04-21 DIAGNOSIS — D638 Anemia in other chronic diseases classified elsewhere: Secondary | ICD-10-CM | POA: Diagnosis not present

## 2018-04-21 DIAGNOSIS — E039 Hypothyroidism, unspecified: Secondary | ICD-10-CM | POA: Diagnosis not present

## 2018-04-23 DIAGNOSIS — I4891 Unspecified atrial fibrillation: Secondary | ICD-10-CM | POA: Diagnosis not present

## 2018-04-23 DIAGNOSIS — I6359 Cerebral infarction due to unspecified occlusion or stenosis of other cerebral artery: Secondary | ICD-10-CM | POA: Diagnosis not present

## 2018-04-23 DIAGNOSIS — I10 Essential (primary) hypertension: Secondary | ICD-10-CM | POA: Diagnosis not present

## 2018-04-23 DIAGNOSIS — D649 Anemia, unspecified: Secondary | ICD-10-CM | POA: Diagnosis not present

## 2018-04-27 DIAGNOSIS — Z9181 History of falling: Secondary | ICD-10-CM | POA: Diagnosis not present

## 2018-04-27 DIAGNOSIS — F419 Anxiety disorder, unspecified: Secondary | ICD-10-CM | POA: Diagnosis not present

## 2018-04-27 DIAGNOSIS — M150 Primary generalized (osteo)arthritis: Secondary | ICD-10-CM | POA: Diagnosis not present

## 2018-04-27 DIAGNOSIS — R32 Unspecified urinary incontinence: Secondary | ICD-10-CM | POA: Diagnosis not present

## 2018-04-27 DIAGNOSIS — D649 Anemia, unspecified: Secondary | ICD-10-CM | POA: Diagnosis not present

## 2018-04-27 DIAGNOSIS — I69391 Dysphagia following cerebral infarction: Secondary | ICD-10-CM | POA: Diagnosis not present

## 2018-04-27 DIAGNOSIS — Z90710 Acquired absence of both cervix and uterus: Secondary | ICD-10-CM | POA: Diagnosis not present

## 2018-04-27 DIAGNOSIS — R1312 Dysphagia, oropharyngeal phase: Secondary | ICD-10-CM | POA: Diagnosis not present

## 2018-04-27 DIAGNOSIS — I69354 Hemiplegia and hemiparesis following cerebral infarction affecting left non-dominant side: Secondary | ICD-10-CM | POA: Diagnosis not present

## 2018-04-27 DIAGNOSIS — I10 Essential (primary) hypertension: Secondary | ICD-10-CM | POA: Diagnosis not present

## 2018-04-27 DIAGNOSIS — Z8701 Personal history of pneumonia (recurrent): Secondary | ICD-10-CM | POA: Diagnosis not present

## 2018-04-27 DIAGNOSIS — I4891 Unspecified atrial fibrillation: Secondary | ICD-10-CM | POA: Diagnosis not present

## 2018-04-27 DIAGNOSIS — Z7901 Long term (current) use of anticoagulants: Secondary | ICD-10-CM | POA: Diagnosis not present

## 2018-04-27 DIAGNOSIS — F329 Major depressive disorder, single episode, unspecified: Secondary | ICD-10-CM | POA: Diagnosis not present

## 2018-04-27 DIAGNOSIS — Z431 Encounter for attention to gastrostomy: Secondary | ICD-10-CM | POA: Diagnosis not present

## 2018-04-27 DIAGNOSIS — I38 Endocarditis, valve unspecified: Secondary | ICD-10-CM | POA: Diagnosis not present

## 2018-04-27 DIAGNOSIS — Z87440 Personal history of urinary (tract) infections: Secondary | ICD-10-CM | POA: Diagnosis not present

## 2018-04-28 DIAGNOSIS — I4891 Unspecified atrial fibrillation: Secondary | ICD-10-CM | POA: Diagnosis not present

## 2018-04-28 DIAGNOSIS — R1312 Dysphagia, oropharyngeal phase: Secondary | ICD-10-CM | POA: Diagnosis not present

## 2018-04-28 DIAGNOSIS — I38 Endocarditis, valve unspecified: Secondary | ICD-10-CM | POA: Diagnosis not present

## 2018-04-28 DIAGNOSIS — I69354 Hemiplegia and hemiparesis following cerebral infarction affecting left non-dominant side: Secondary | ICD-10-CM | POA: Diagnosis not present

## 2018-04-28 DIAGNOSIS — I10 Essential (primary) hypertension: Secondary | ICD-10-CM | POA: Diagnosis not present

## 2018-04-28 DIAGNOSIS — I69391 Dysphagia following cerebral infarction: Secondary | ICD-10-CM | POA: Diagnosis not present

## 2018-04-29 DIAGNOSIS — I38 Endocarditis, valve unspecified: Secondary | ICD-10-CM | POA: Diagnosis not present

## 2018-04-29 DIAGNOSIS — I69391 Dysphagia following cerebral infarction: Secondary | ICD-10-CM | POA: Diagnosis not present

## 2018-04-29 DIAGNOSIS — R1312 Dysphagia, oropharyngeal phase: Secondary | ICD-10-CM | POA: Diagnosis not present

## 2018-04-29 DIAGNOSIS — I4891 Unspecified atrial fibrillation: Secondary | ICD-10-CM | POA: Diagnosis not present

## 2018-04-29 DIAGNOSIS — I10 Essential (primary) hypertension: Secondary | ICD-10-CM | POA: Diagnosis not present

## 2018-04-29 DIAGNOSIS — I69354 Hemiplegia and hemiparesis following cerebral infarction affecting left non-dominant side: Secondary | ICD-10-CM | POA: Diagnosis not present

## 2018-05-01 DIAGNOSIS — I69354 Hemiplegia and hemiparesis following cerebral infarction affecting left non-dominant side: Secondary | ICD-10-CM | POA: Diagnosis not present

## 2018-05-01 DIAGNOSIS — I38 Endocarditis, valve unspecified: Secondary | ICD-10-CM | POA: Diagnosis not present

## 2018-05-01 DIAGNOSIS — R1312 Dysphagia, oropharyngeal phase: Secondary | ICD-10-CM | POA: Diagnosis not present

## 2018-05-01 DIAGNOSIS — I10 Essential (primary) hypertension: Secondary | ICD-10-CM | POA: Diagnosis not present

## 2018-05-01 DIAGNOSIS — I69391 Dysphagia following cerebral infarction: Secondary | ICD-10-CM | POA: Diagnosis not present

## 2018-05-01 DIAGNOSIS — I4891 Unspecified atrial fibrillation: Secondary | ICD-10-CM | POA: Diagnosis not present

## 2018-05-02 DIAGNOSIS — I4891 Unspecified atrial fibrillation: Secondary | ICD-10-CM | POA: Diagnosis not present

## 2018-05-02 DIAGNOSIS — I38 Endocarditis, valve unspecified: Secondary | ICD-10-CM | POA: Diagnosis not present

## 2018-05-02 DIAGNOSIS — I69391 Dysphagia following cerebral infarction: Secondary | ICD-10-CM | POA: Diagnosis not present

## 2018-05-02 DIAGNOSIS — I69354 Hemiplegia and hemiparesis following cerebral infarction affecting left non-dominant side: Secondary | ICD-10-CM | POA: Diagnosis not present

## 2018-05-02 DIAGNOSIS — I10 Essential (primary) hypertension: Secondary | ICD-10-CM | POA: Diagnosis not present

## 2018-05-02 DIAGNOSIS — R1312 Dysphagia, oropharyngeal phase: Secondary | ICD-10-CM | POA: Diagnosis not present

## 2018-05-06 DIAGNOSIS — R1312 Dysphagia, oropharyngeal phase: Secondary | ICD-10-CM | POA: Diagnosis not present

## 2018-05-06 DIAGNOSIS — I69354 Hemiplegia and hemiparesis following cerebral infarction affecting left non-dominant side: Secondary | ICD-10-CM | POA: Diagnosis not present

## 2018-05-06 DIAGNOSIS — I69391 Dysphagia following cerebral infarction: Secondary | ICD-10-CM | POA: Diagnosis not present

## 2018-05-06 DIAGNOSIS — I4891 Unspecified atrial fibrillation: Secondary | ICD-10-CM | POA: Diagnosis not present

## 2018-05-06 DIAGNOSIS — I38 Endocarditis, valve unspecified: Secondary | ICD-10-CM | POA: Diagnosis not present

## 2018-05-06 DIAGNOSIS — I10 Essential (primary) hypertension: Secondary | ICD-10-CM | POA: Diagnosis not present

## 2018-05-07 DIAGNOSIS — I38 Endocarditis, valve unspecified: Secondary | ICD-10-CM | POA: Diagnosis not present

## 2018-05-07 DIAGNOSIS — I69354 Hemiplegia and hemiparesis following cerebral infarction affecting left non-dominant side: Secondary | ICD-10-CM | POA: Diagnosis not present

## 2018-05-07 DIAGNOSIS — I69391 Dysphagia following cerebral infarction: Secondary | ICD-10-CM | POA: Diagnosis not present

## 2018-05-07 DIAGNOSIS — I10 Essential (primary) hypertension: Secondary | ICD-10-CM | POA: Diagnosis not present

## 2018-05-07 DIAGNOSIS — R1312 Dysphagia, oropharyngeal phase: Secondary | ICD-10-CM | POA: Diagnosis not present

## 2018-05-07 DIAGNOSIS — I4891 Unspecified atrial fibrillation: Secondary | ICD-10-CM | POA: Diagnosis not present

## 2018-05-08 DIAGNOSIS — I69354 Hemiplegia and hemiparesis following cerebral infarction affecting left non-dominant side: Secondary | ICD-10-CM | POA: Diagnosis not present

## 2018-05-08 DIAGNOSIS — I38 Endocarditis, valve unspecified: Secondary | ICD-10-CM | POA: Diagnosis not present

## 2018-05-08 DIAGNOSIS — I69391 Dysphagia following cerebral infarction: Secondary | ICD-10-CM | POA: Diagnosis not present

## 2018-05-08 DIAGNOSIS — I4891 Unspecified atrial fibrillation: Secondary | ICD-10-CM | POA: Diagnosis not present

## 2018-05-08 DIAGNOSIS — I10 Essential (primary) hypertension: Secondary | ICD-10-CM | POA: Diagnosis not present

## 2018-05-08 DIAGNOSIS — R1312 Dysphagia, oropharyngeal phase: Secondary | ICD-10-CM | POA: Diagnosis not present

## 2018-05-09 DIAGNOSIS — I38 Endocarditis, valve unspecified: Secondary | ICD-10-CM | POA: Diagnosis not present

## 2018-05-09 DIAGNOSIS — I69354 Hemiplegia and hemiparesis following cerebral infarction affecting left non-dominant side: Secondary | ICD-10-CM | POA: Diagnosis not present

## 2018-05-09 DIAGNOSIS — I4891 Unspecified atrial fibrillation: Secondary | ICD-10-CM | POA: Diagnosis not present

## 2018-05-09 DIAGNOSIS — R1312 Dysphagia, oropharyngeal phase: Secondary | ICD-10-CM | POA: Diagnosis not present

## 2018-05-09 DIAGNOSIS — I63511 Cerebral infarction due to unspecified occlusion or stenosis of right middle cerebral artery: Secondary | ICD-10-CM | POA: Diagnosis not present

## 2018-05-09 DIAGNOSIS — I69391 Dysphagia following cerebral infarction: Secondary | ICD-10-CM | POA: Diagnosis not present

## 2018-05-09 DIAGNOSIS — I160 Hypertensive urgency: Secondary | ICD-10-CM | POA: Diagnosis not present

## 2018-05-09 DIAGNOSIS — I480 Paroxysmal atrial fibrillation: Secondary | ICD-10-CM | POA: Diagnosis not present

## 2018-05-09 DIAGNOSIS — I10 Essential (primary) hypertension: Secondary | ICD-10-CM | POA: Diagnosis not present

## 2018-05-12 DIAGNOSIS — I69391 Dysphagia following cerebral infarction: Secondary | ICD-10-CM | POA: Diagnosis not present

## 2018-05-12 DIAGNOSIS — I38 Endocarditis, valve unspecified: Secondary | ICD-10-CM | POA: Diagnosis not present

## 2018-05-12 DIAGNOSIS — R1312 Dysphagia, oropharyngeal phase: Secondary | ICD-10-CM | POA: Diagnosis not present

## 2018-05-12 DIAGNOSIS — I10 Essential (primary) hypertension: Secondary | ICD-10-CM | POA: Diagnosis not present

## 2018-05-12 DIAGNOSIS — I69354 Hemiplegia and hemiparesis following cerebral infarction affecting left non-dominant side: Secondary | ICD-10-CM | POA: Diagnosis not present

## 2018-05-12 DIAGNOSIS — I4891 Unspecified atrial fibrillation: Secondary | ICD-10-CM | POA: Diagnosis not present

## 2018-05-13 DIAGNOSIS — R1312 Dysphagia, oropharyngeal phase: Secondary | ICD-10-CM | POA: Diagnosis not present

## 2018-05-13 DIAGNOSIS — I4891 Unspecified atrial fibrillation: Secondary | ICD-10-CM | POA: Diagnosis not present

## 2018-05-13 DIAGNOSIS — I69391 Dysphagia following cerebral infarction: Secondary | ICD-10-CM | POA: Diagnosis not present

## 2018-05-13 DIAGNOSIS — I10 Essential (primary) hypertension: Secondary | ICD-10-CM | POA: Diagnosis not present

## 2018-05-13 DIAGNOSIS — I69354 Hemiplegia and hemiparesis following cerebral infarction affecting left non-dominant side: Secondary | ICD-10-CM | POA: Diagnosis not present

## 2018-05-13 DIAGNOSIS — I38 Endocarditis, valve unspecified: Secondary | ICD-10-CM | POA: Diagnosis not present

## 2018-05-14 DIAGNOSIS — R1312 Dysphagia, oropharyngeal phase: Secondary | ICD-10-CM | POA: Diagnosis not present

## 2018-05-14 DIAGNOSIS — I10 Essential (primary) hypertension: Secondary | ICD-10-CM | POA: Diagnosis not present

## 2018-05-14 DIAGNOSIS — I4891 Unspecified atrial fibrillation: Secondary | ICD-10-CM | POA: Diagnosis not present

## 2018-05-14 DIAGNOSIS — I38 Endocarditis, valve unspecified: Secondary | ICD-10-CM | POA: Diagnosis not present

## 2018-05-14 DIAGNOSIS — I69391 Dysphagia following cerebral infarction: Secondary | ICD-10-CM | POA: Diagnosis not present

## 2018-05-14 DIAGNOSIS — I69354 Hemiplegia and hemiparesis following cerebral infarction affecting left non-dominant side: Secondary | ICD-10-CM | POA: Diagnosis not present

## 2018-05-15 DIAGNOSIS — R1312 Dysphagia, oropharyngeal phase: Secondary | ICD-10-CM | POA: Diagnosis not present

## 2018-05-15 DIAGNOSIS — I69391 Dysphagia following cerebral infarction: Secondary | ICD-10-CM | POA: Diagnosis not present

## 2018-05-15 DIAGNOSIS — I38 Endocarditis, valve unspecified: Secondary | ICD-10-CM | POA: Diagnosis not present

## 2018-05-15 DIAGNOSIS — I4891 Unspecified atrial fibrillation: Secondary | ICD-10-CM | POA: Diagnosis not present

## 2018-05-15 DIAGNOSIS — I69354 Hemiplegia and hemiparesis following cerebral infarction affecting left non-dominant side: Secondary | ICD-10-CM | POA: Diagnosis not present

## 2018-05-15 DIAGNOSIS — I10 Essential (primary) hypertension: Secondary | ICD-10-CM | POA: Diagnosis not present

## 2018-05-16 DIAGNOSIS — I4891 Unspecified atrial fibrillation: Secondary | ICD-10-CM | POA: Diagnosis not present

## 2018-05-16 DIAGNOSIS — I38 Endocarditis, valve unspecified: Secondary | ICD-10-CM | POA: Diagnosis not present

## 2018-05-16 DIAGNOSIS — I69391 Dysphagia following cerebral infarction: Secondary | ICD-10-CM | POA: Diagnosis not present

## 2018-05-16 DIAGNOSIS — I10 Essential (primary) hypertension: Secondary | ICD-10-CM | POA: Diagnosis not present

## 2018-05-16 DIAGNOSIS — I69354 Hemiplegia and hemiparesis following cerebral infarction affecting left non-dominant side: Secondary | ICD-10-CM | POA: Diagnosis not present

## 2018-05-16 DIAGNOSIS — R1312 Dysphagia, oropharyngeal phase: Secondary | ICD-10-CM | POA: Diagnosis not present

## 2018-05-19 DIAGNOSIS — R1312 Dysphagia, oropharyngeal phase: Secondary | ICD-10-CM | POA: Diagnosis not present

## 2018-05-19 DIAGNOSIS — I4891 Unspecified atrial fibrillation: Secondary | ICD-10-CM | POA: Diagnosis not present

## 2018-05-19 DIAGNOSIS — I10 Essential (primary) hypertension: Secondary | ICD-10-CM | POA: Diagnosis not present

## 2018-05-19 DIAGNOSIS — I69354 Hemiplegia and hemiparesis following cerebral infarction affecting left non-dominant side: Secondary | ICD-10-CM | POA: Diagnosis not present

## 2018-05-19 DIAGNOSIS — I38 Endocarditis, valve unspecified: Secondary | ICD-10-CM | POA: Diagnosis not present

## 2018-05-19 DIAGNOSIS — I69391 Dysphagia following cerebral infarction: Secondary | ICD-10-CM | POA: Diagnosis not present

## 2018-05-20 DIAGNOSIS — I10 Essential (primary) hypertension: Secondary | ICD-10-CM | POA: Diagnosis not present

## 2018-05-20 DIAGNOSIS — N390 Urinary tract infection, site not specified: Secondary | ICD-10-CM | POA: Diagnosis not present

## 2018-05-20 DIAGNOSIS — I69354 Hemiplegia and hemiparesis following cerebral infarction affecting left non-dominant side: Secondary | ICD-10-CM | POA: Diagnosis not present

## 2018-05-20 DIAGNOSIS — I4891 Unspecified atrial fibrillation: Secondary | ICD-10-CM | POA: Diagnosis not present

## 2018-05-20 DIAGNOSIS — M810 Age-related osteoporosis without current pathological fracture: Secondary | ICD-10-CM | POA: Diagnosis not present

## 2018-05-20 DIAGNOSIS — R1312 Dysphagia, oropharyngeal phase: Secondary | ICD-10-CM | POA: Diagnosis not present

## 2018-05-20 DIAGNOSIS — I38 Endocarditis, valve unspecified: Secondary | ICD-10-CM | POA: Diagnosis not present

## 2018-05-20 DIAGNOSIS — I69391 Dysphagia following cerebral infarction: Secondary | ICD-10-CM | POA: Diagnosis not present

## 2018-05-21 DIAGNOSIS — I69354 Hemiplegia and hemiparesis following cerebral infarction affecting left non-dominant side: Secondary | ICD-10-CM | POA: Diagnosis not present

## 2018-05-21 DIAGNOSIS — I69391 Dysphagia following cerebral infarction: Secondary | ICD-10-CM | POA: Diagnosis not present

## 2018-05-21 DIAGNOSIS — I4891 Unspecified atrial fibrillation: Secondary | ICD-10-CM | POA: Diagnosis not present

## 2018-05-21 DIAGNOSIS — I10 Essential (primary) hypertension: Secondary | ICD-10-CM | POA: Diagnosis not present

## 2018-05-21 DIAGNOSIS — I38 Endocarditis, valve unspecified: Secondary | ICD-10-CM | POA: Diagnosis not present

## 2018-05-21 DIAGNOSIS — R1312 Dysphagia, oropharyngeal phase: Secondary | ICD-10-CM | POA: Diagnosis not present

## 2018-05-22 DIAGNOSIS — R1312 Dysphagia, oropharyngeal phase: Secondary | ICD-10-CM | POA: Diagnosis not present

## 2018-05-22 DIAGNOSIS — I69391 Dysphagia following cerebral infarction: Secondary | ICD-10-CM | POA: Diagnosis not present

## 2018-05-22 DIAGNOSIS — I38 Endocarditis, valve unspecified: Secondary | ICD-10-CM | POA: Diagnosis not present

## 2018-05-22 DIAGNOSIS — I69354 Hemiplegia and hemiparesis following cerebral infarction affecting left non-dominant side: Secondary | ICD-10-CM | POA: Diagnosis not present

## 2018-05-22 DIAGNOSIS — I10 Essential (primary) hypertension: Secondary | ICD-10-CM | POA: Diagnosis not present

## 2018-05-22 DIAGNOSIS — I4891 Unspecified atrial fibrillation: Secondary | ICD-10-CM | POA: Diagnosis not present

## 2018-05-23 DIAGNOSIS — I69354 Hemiplegia and hemiparesis following cerebral infarction affecting left non-dominant side: Secondary | ICD-10-CM | POA: Diagnosis not present

## 2018-05-23 DIAGNOSIS — R1312 Dysphagia, oropharyngeal phase: Secondary | ICD-10-CM | POA: Diagnosis not present

## 2018-05-23 DIAGNOSIS — I69391 Dysphagia following cerebral infarction: Secondary | ICD-10-CM | POA: Diagnosis not present

## 2018-05-23 DIAGNOSIS — I10 Essential (primary) hypertension: Secondary | ICD-10-CM | POA: Diagnosis not present

## 2018-05-23 DIAGNOSIS — I38 Endocarditis, valve unspecified: Secondary | ICD-10-CM | POA: Diagnosis not present

## 2018-05-23 DIAGNOSIS — I4891 Unspecified atrial fibrillation: Secondary | ICD-10-CM | POA: Diagnosis not present

## 2018-05-27 DIAGNOSIS — R1312 Dysphagia, oropharyngeal phase: Secondary | ICD-10-CM | POA: Diagnosis not present

## 2018-05-27 DIAGNOSIS — I38 Endocarditis, valve unspecified: Secondary | ICD-10-CM | POA: Diagnosis not present

## 2018-05-27 DIAGNOSIS — I10 Essential (primary) hypertension: Secondary | ICD-10-CM | POA: Diagnosis not present

## 2018-05-27 DIAGNOSIS — I4891 Unspecified atrial fibrillation: Secondary | ICD-10-CM | POA: Diagnosis not present

## 2018-05-27 DIAGNOSIS — I69354 Hemiplegia and hemiparesis following cerebral infarction affecting left non-dominant side: Secondary | ICD-10-CM | POA: Diagnosis not present

## 2018-05-27 DIAGNOSIS — I69391 Dysphagia following cerebral infarction: Secondary | ICD-10-CM | POA: Diagnosis not present

## 2018-05-28 DIAGNOSIS — I10 Essential (primary) hypertension: Secondary | ICD-10-CM | POA: Diagnosis not present

## 2018-05-28 DIAGNOSIS — I4891 Unspecified atrial fibrillation: Secondary | ICD-10-CM | POA: Diagnosis not present

## 2018-05-28 DIAGNOSIS — I69354 Hemiplegia and hemiparesis following cerebral infarction affecting left non-dominant side: Secondary | ICD-10-CM | POA: Diagnosis not present

## 2018-05-28 DIAGNOSIS — I38 Endocarditis, valve unspecified: Secondary | ICD-10-CM | POA: Diagnosis not present

## 2018-05-28 DIAGNOSIS — R1312 Dysphagia, oropharyngeal phase: Secondary | ICD-10-CM | POA: Diagnosis not present

## 2018-05-28 DIAGNOSIS — I69391 Dysphagia following cerebral infarction: Secondary | ICD-10-CM | POA: Diagnosis not present

## 2018-05-30 DIAGNOSIS — R1312 Dysphagia, oropharyngeal phase: Secondary | ICD-10-CM | POA: Diagnosis not present

## 2018-05-30 DIAGNOSIS — I38 Endocarditis, valve unspecified: Secondary | ICD-10-CM | POA: Diagnosis not present

## 2018-05-30 DIAGNOSIS — I69354 Hemiplegia and hemiparesis following cerebral infarction affecting left non-dominant side: Secondary | ICD-10-CM | POA: Diagnosis not present

## 2018-05-30 DIAGNOSIS — I4891 Unspecified atrial fibrillation: Secondary | ICD-10-CM | POA: Diagnosis not present

## 2018-05-30 DIAGNOSIS — I10 Essential (primary) hypertension: Secondary | ICD-10-CM | POA: Diagnosis not present

## 2018-05-30 DIAGNOSIS — I69391 Dysphagia following cerebral infarction: Secondary | ICD-10-CM | POA: Diagnosis not present

## 2018-06-02 DIAGNOSIS — I69354 Hemiplegia and hemiparesis following cerebral infarction affecting left non-dominant side: Secondary | ICD-10-CM | POA: Diagnosis not present

## 2018-06-02 DIAGNOSIS — I4891 Unspecified atrial fibrillation: Secondary | ICD-10-CM | POA: Diagnosis not present

## 2018-06-02 DIAGNOSIS — R1312 Dysphagia, oropharyngeal phase: Secondary | ICD-10-CM | POA: Diagnosis not present

## 2018-06-02 DIAGNOSIS — I69391 Dysphagia following cerebral infarction: Secondary | ICD-10-CM | POA: Diagnosis not present

## 2018-06-02 DIAGNOSIS — I38 Endocarditis, valve unspecified: Secondary | ICD-10-CM | POA: Diagnosis not present

## 2018-06-02 DIAGNOSIS — I10 Essential (primary) hypertension: Secondary | ICD-10-CM | POA: Diagnosis not present

## 2018-06-03 DIAGNOSIS — R1312 Dysphagia, oropharyngeal phase: Secondary | ICD-10-CM | POA: Diagnosis not present

## 2018-06-03 DIAGNOSIS — M810 Age-related osteoporosis without current pathological fracture: Secondary | ICD-10-CM | POA: Diagnosis not present

## 2018-06-03 DIAGNOSIS — I69354 Hemiplegia and hemiparesis following cerebral infarction affecting left non-dominant side: Secondary | ICD-10-CM | POA: Diagnosis not present

## 2018-06-03 DIAGNOSIS — I10 Essential (primary) hypertension: Secondary | ICD-10-CM | POA: Diagnosis not present

## 2018-06-03 DIAGNOSIS — I69391 Dysphagia following cerebral infarction: Secondary | ICD-10-CM | POA: Diagnosis not present

## 2018-06-03 DIAGNOSIS — I480 Paroxysmal atrial fibrillation: Secondary | ICD-10-CM | POA: Diagnosis not present

## 2018-06-03 DIAGNOSIS — I639 Cerebral infarction, unspecified: Secondary | ICD-10-CM | POA: Diagnosis not present

## 2018-06-03 DIAGNOSIS — I4891 Unspecified atrial fibrillation: Secondary | ICD-10-CM | POA: Diagnosis not present

## 2018-06-03 DIAGNOSIS — I38 Endocarditis, valve unspecified: Secondary | ICD-10-CM | POA: Diagnosis not present

## 2018-06-06 DIAGNOSIS — I38 Endocarditis, valve unspecified: Secondary | ICD-10-CM | POA: Diagnosis not present

## 2018-06-06 DIAGNOSIS — I69391 Dysphagia following cerebral infarction: Secondary | ICD-10-CM | POA: Diagnosis not present

## 2018-06-06 DIAGNOSIS — I10 Essential (primary) hypertension: Secondary | ICD-10-CM | POA: Diagnosis not present

## 2018-06-06 DIAGNOSIS — R1312 Dysphagia, oropharyngeal phase: Secondary | ICD-10-CM | POA: Diagnosis not present

## 2018-06-06 DIAGNOSIS — I69354 Hemiplegia and hemiparesis following cerebral infarction affecting left non-dominant side: Secondary | ICD-10-CM | POA: Diagnosis not present

## 2018-06-06 DIAGNOSIS — I4891 Unspecified atrial fibrillation: Secondary | ICD-10-CM | POA: Diagnosis not present

## 2018-06-09 DIAGNOSIS — I10 Essential (primary) hypertension: Secondary | ICD-10-CM | POA: Diagnosis not present

## 2018-06-09 DIAGNOSIS — I4891 Unspecified atrial fibrillation: Secondary | ICD-10-CM | POA: Diagnosis not present

## 2018-06-09 DIAGNOSIS — I38 Endocarditis, valve unspecified: Secondary | ICD-10-CM | POA: Diagnosis not present

## 2018-06-09 DIAGNOSIS — I69354 Hemiplegia and hemiparesis following cerebral infarction affecting left non-dominant side: Secondary | ICD-10-CM | POA: Diagnosis not present

## 2018-06-09 DIAGNOSIS — I69391 Dysphagia following cerebral infarction: Secondary | ICD-10-CM | POA: Diagnosis not present

## 2018-06-09 DIAGNOSIS — R1312 Dysphagia, oropharyngeal phase: Secondary | ICD-10-CM | POA: Diagnosis not present

## 2018-06-11 DIAGNOSIS — I69391 Dysphagia following cerebral infarction: Secondary | ICD-10-CM | POA: Diagnosis not present

## 2018-06-11 DIAGNOSIS — I4891 Unspecified atrial fibrillation: Secondary | ICD-10-CM | POA: Diagnosis not present

## 2018-06-11 DIAGNOSIS — I69354 Hemiplegia and hemiparesis following cerebral infarction affecting left non-dominant side: Secondary | ICD-10-CM | POA: Diagnosis not present

## 2018-06-11 DIAGNOSIS — R1312 Dysphagia, oropharyngeal phase: Secondary | ICD-10-CM | POA: Diagnosis not present

## 2018-06-11 DIAGNOSIS — I38 Endocarditis, valve unspecified: Secondary | ICD-10-CM | POA: Diagnosis not present

## 2018-06-11 DIAGNOSIS — I10 Essential (primary) hypertension: Secondary | ICD-10-CM | POA: Diagnosis not present

## 2018-06-15 DIAGNOSIS — R1312 Dysphagia, oropharyngeal phase: Secondary | ICD-10-CM | POA: Diagnosis not present

## 2018-06-15 DIAGNOSIS — I38 Endocarditis, valve unspecified: Secondary | ICD-10-CM | POA: Diagnosis not present

## 2018-06-15 DIAGNOSIS — I69354 Hemiplegia and hemiparesis following cerebral infarction affecting left non-dominant side: Secondary | ICD-10-CM | POA: Diagnosis not present

## 2018-06-15 DIAGNOSIS — I69391 Dysphagia following cerebral infarction: Secondary | ICD-10-CM | POA: Diagnosis not present

## 2018-06-15 DIAGNOSIS — I10 Essential (primary) hypertension: Secondary | ICD-10-CM | POA: Diagnosis not present

## 2018-06-15 DIAGNOSIS — I4891 Unspecified atrial fibrillation: Secondary | ICD-10-CM | POA: Diagnosis not present

## 2018-06-16 DIAGNOSIS — I69354 Hemiplegia and hemiparesis following cerebral infarction affecting left non-dominant side: Secondary | ICD-10-CM | POA: Diagnosis not present

## 2018-06-16 DIAGNOSIS — I69391 Dysphagia following cerebral infarction: Secondary | ICD-10-CM | POA: Diagnosis not present

## 2018-06-16 DIAGNOSIS — I38 Endocarditis, valve unspecified: Secondary | ICD-10-CM | POA: Diagnosis not present

## 2018-06-16 DIAGNOSIS — I10 Essential (primary) hypertension: Secondary | ICD-10-CM | POA: Diagnosis not present

## 2018-06-16 DIAGNOSIS — R1312 Dysphagia, oropharyngeal phase: Secondary | ICD-10-CM | POA: Diagnosis not present

## 2018-06-16 DIAGNOSIS — I4891 Unspecified atrial fibrillation: Secondary | ICD-10-CM | POA: Diagnosis not present

## 2018-06-17 DIAGNOSIS — I69354 Hemiplegia and hemiparesis following cerebral infarction affecting left non-dominant side: Secondary | ICD-10-CM | POA: Diagnosis not present

## 2018-06-17 DIAGNOSIS — I4891 Unspecified atrial fibrillation: Secondary | ICD-10-CM | POA: Diagnosis not present

## 2018-06-17 DIAGNOSIS — R1312 Dysphagia, oropharyngeal phase: Secondary | ICD-10-CM | POA: Diagnosis not present

## 2018-06-17 DIAGNOSIS — I69391 Dysphagia following cerebral infarction: Secondary | ICD-10-CM | POA: Diagnosis not present

## 2018-06-17 DIAGNOSIS — I38 Endocarditis, valve unspecified: Secondary | ICD-10-CM | POA: Diagnosis not present

## 2018-06-17 DIAGNOSIS — I10 Essential (primary) hypertension: Secondary | ICD-10-CM | POA: Diagnosis not present

## 2018-06-18 DIAGNOSIS — I69391 Dysphagia following cerebral infarction: Secondary | ICD-10-CM | POA: Diagnosis not present

## 2018-06-18 DIAGNOSIS — I38 Endocarditis, valve unspecified: Secondary | ICD-10-CM | POA: Diagnosis not present

## 2018-06-18 DIAGNOSIS — I4891 Unspecified atrial fibrillation: Secondary | ICD-10-CM | POA: Diagnosis not present

## 2018-06-18 DIAGNOSIS — R1312 Dysphagia, oropharyngeal phase: Secondary | ICD-10-CM | POA: Diagnosis not present

## 2018-06-18 DIAGNOSIS — I69354 Hemiplegia and hemiparesis following cerebral infarction affecting left non-dominant side: Secondary | ICD-10-CM | POA: Diagnosis not present

## 2018-06-18 DIAGNOSIS — I10 Essential (primary) hypertension: Secondary | ICD-10-CM | POA: Diagnosis not present

## 2018-06-19 DIAGNOSIS — R1312 Dysphagia, oropharyngeal phase: Secondary | ICD-10-CM | POA: Diagnosis not present

## 2018-06-19 DIAGNOSIS — I69391 Dysphagia following cerebral infarction: Secondary | ICD-10-CM | POA: Diagnosis not present

## 2018-06-19 DIAGNOSIS — I10 Essential (primary) hypertension: Secondary | ICD-10-CM | POA: Diagnosis not present

## 2018-06-19 DIAGNOSIS — I4891 Unspecified atrial fibrillation: Secondary | ICD-10-CM | POA: Diagnosis not present

## 2018-06-19 DIAGNOSIS — I69354 Hemiplegia and hemiparesis following cerebral infarction affecting left non-dominant side: Secondary | ICD-10-CM | POA: Diagnosis not present

## 2018-06-19 DIAGNOSIS — I38 Endocarditis, valve unspecified: Secondary | ICD-10-CM | POA: Diagnosis not present

## 2018-06-20 DIAGNOSIS — I69391 Dysphagia following cerebral infarction: Secondary | ICD-10-CM | POA: Diagnosis not present

## 2018-06-20 DIAGNOSIS — I69354 Hemiplegia and hemiparesis following cerebral infarction affecting left non-dominant side: Secondary | ICD-10-CM | POA: Diagnosis not present

## 2018-06-20 DIAGNOSIS — I10 Essential (primary) hypertension: Secondary | ICD-10-CM | POA: Diagnosis not present

## 2018-06-20 DIAGNOSIS — I38 Endocarditis, valve unspecified: Secondary | ICD-10-CM | POA: Diagnosis not present

## 2018-06-20 DIAGNOSIS — R1312 Dysphagia, oropharyngeal phase: Secondary | ICD-10-CM | POA: Diagnosis not present

## 2018-06-20 DIAGNOSIS — I4891 Unspecified atrial fibrillation: Secondary | ICD-10-CM | POA: Diagnosis not present

## 2018-06-21 DIAGNOSIS — I69391 Dysphagia following cerebral infarction: Secondary | ICD-10-CM | POA: Diagnosis not present

## 2018-06-21 DIAGNOSIS — I38 Endocarditis, valve unspecified: Secondary | ICD-10-CM | POA: Diagnosis not present

## 2018-06-21 DIAGNOSIS — I10 Essential (primary) hypertension: Secondary | ICD-10-CM | POA: Diagnosis not present

## 2018-06-21 DIAGNOSIS — R1312 Dysphagia, oropharyngeal phase: Secondary | ICD-10-CM | POA: Diagnosis not present

## 2018-06-21 DIAGNOSIS — I4891 Unspecified atrial fibrillation: Secondary | ICD-10-CM | POA: Diagnosis not present

## 2018-06-21 DIAGNOSIS — I69354 Hemiplegia and hemiparesis following cerebral infarction affecting left non-dominant side: Secondary | ICD-10-CM | POA: Diagnosis not present

## 2018-06-24 DIAGNOSIS — R1312 Dysphagia, oropharyngeal phase: Secondary | ICD-10-CM | POA: Diagnosis not present

## 2018-06-24 DIAGNOSIS — I4891 Unspecified atrial fibrillation: Secondary | ICD-10-CM | POA: Diagnosis not present

## 2018-06-24 DIAGNOSIS — I10 Essential (primary) hypertension: Secondary | ICD-10-CM | POA: Diagnosis not present

## 2018-06-24 DIAGNOSIS — I38 Endocarditis, valve unspecified: Secondary | ICD-10-CM | POA: Diagnosis not present

## 2018-06-24 DIAGNOSIS — I69354 Hemiplegia and hemiparesis following cerebral infarction affecting left non-dominant side: Secondary | ICD-10-CM | POA: Diagnosis not present

## 2018-06-24 DIAGNOSIS — I69391 Dysphagia following cerebral infarction: Secondary | ICD-10-CM | POA: Diagnosis not present

## 2018-06-25 DIAGNOSIS — I10 Essential (primary) hypertension: Secondary | ICD-10-CM | POA: Diagnosis not present

## 2018-06-25 DIAGNOSIS — I38 Endocarditis, valve unspecified: Secondary | ICD-10-CM | POA: Diagnosis not present

## 2018-06-25 DIAGNOSIS — I4891 Unspecified atrial fibrillation: Secondary | ICD-10-CM | POA: Diagnosis not present

## 2018-06-25 DIAGNOSIS — R1312 Dysphagia, oropharyngeal phase: Secondary | ICD-10-CM | POA: Diagnosis not present

## 2018-06-25 DIAGNOSIS — I69391 Dysphagia following cerebral infarction: Secondary | ICD-10-CM | POA: Diagnosis not present

## 2018-06-25 DIAGNOSIS — I69354 Hemiplegia and hemiparesis following cerebral infarction affecting left non-dominant side: Secondary | ICD-10-CM | POA: Diagnosis not present

## 2018-06-26 DIAGNOSIS — S31829A Unspecified open wound of left buttock, initial encounter: Secondary | ICD-10-CM | POA: Diagnosis not present

## 2018-06-26 DIAGNOSIS — I38 Endocarditis, valve unspecified: Secondary | ICD-10-CM | POA: Diagnosis not present

## 2018-06-26 DIAGNOSIS — I69391 Dysphagia following cerebral infarction: Secondary | ICD-10-CM | POA: Diagnosis not present

## 2018-06-26 DIAGNOSIS — R32 Unspecified urinary incontinence: Secondary | ICD-10-CM | POA: Diagnosis not present

## 2018-06-26 DIAGNOSIS — F419 Anxiety disorder, unspecified: Secondary | ICD-10-CM | POA: Diagnosis not present

## 2018-06-26 DIAGNOSIS — I10 Essential (primary) hypertension: Secondary | ICD-10-CM | POA: Diagnosis not present

## 2018-06-26 DIAGNOSIS — Z9181 History of falling: Secondary | ICD-10-CM | POA: Diagnosis not present

## 2018-06-26 DIAGNOSIS — R1312 Dysphagia, oropharyngeal phase: Secondary | ICD-10-CM | POA: Diagnosis not present

## 2018-06-26 DIAGNOSIS — S31819A Unspecified open wound of right buttock, initial encounter: Secondary | ICD-10-CM | POA: Diagnosis not present

## 2018-06-26 DIAGNOSIS — I4891 Unspecified atrial fibrillation: Secondary | ICD-10-CM | POA: Diagnosis not present

## 2018-06-26 DIAGNOSIS — Z8701 Personal history of pneumonia (recurrent): Secondary | ICD-10-CM | POA: Diagnosis not present

## 2018-06-26 DIAGNOSIS — I69354 Hemiplegia and hemiparesis following cerebral infarction affecting left non-dominant side: Secondary | ICD-10-CM | POA: Diagnosis not present

## 2018-06-26 DIAGNOSIS — Z431 Encounter for attention to gastrostomy: Secondary | ICD-10-CM | POA: Diagnosis not present

## 2018-06-26 DIAGNOSIS — Z7901 Long term (current) use of anticoagulants: Secondary | ICD-10-CM | POA: Diagnosis not present

## 2018-06-26 DIAGNOSIS — Z87440 Personal history of urinary (tract) infections: Secondary | ICD-10-CM | POA: Diagnosis not present

## 2018-06-26 DIAGNOSIS — Z90710 Acquired absence of both cervix and uterus: Secondary | ICD-10-CM | POA: Diagnosis not present

## 2018-06-26 DIAGNOSIS — F329 Major depressive disorder, single episode, unspecified: Secondary | ICD-10-CM | POA: Diagnosis not present

## 2018-06-26 DIAGNOSIS — M150 Primary generalized (osteo)arthritis: Secondary | ICD-10-CM | POA: Diagnosis not present

## 2018-06-26 DIAGNOSIS — D649 Anemia, unspecified: Secondary | ICD-10-CM | POA: Diagnosis not present

## 2018-07-01 DIAGNOSIS — I10 Essential (primary) hypertension: Secondary | ICD-10-CM | POA: Diagnosis not present

## 2018-07-01 DIAGNOSIS — I69391 Dysphagia following cerebral infarction: Secondary | ICD-10-CM | POA: Diagnosis not present

## 2018-07-01 DIAGNOSIS — I69354 Hemiplegia and hemiparesis following cerebral infarction affecting left non-dominant side: Secondary | ICD-10-CM | POA: Diagnosis not present

## 2018-07-01 DIAGNOSIS — S31829A Unspecified open wound of left buttock, initial encounter: Secondary | ICD-10-CM | POA: Diagnosis not present

## 2018-07-01 DIAGNOSIS — I38 Endocarditis, valve unspecified: Secondary | ICD-10-CM | POA: Diagnosis not present

## 2018-07-01 DIAGNOSIS — R1312 Dysphagia, oropharyngeal phase: Secondary | ICD-10-CM | POA: Diagnosis not present

## 2018-07-02 DIAGNOSIS — S31829A Unspecified open wound of left buttock, initial encounter: Secondary | ICD-10-CM | POA: Diagnosis not present

## 2018-07-02 DIAGNOSIS — I69354 Hemiplegia and hemiparesis following cerebral infarction affecting left non-dominant side: Secondary | ICD-10-CM | POA: Diagnosis not present

## 2018-07-02 DIAGNOSIS — I10 Essential (primary) hypertension: Secondary | ICD-10-CM | POA: Diagnosis not present

## 2018-07-02 DIAGNOSIS — R1312 Dysphagia, oropharyngeal phase: Secondary | ICD-10-CM | POA: Diagnosis not present

## 2018-07-02 DIAGNOSIS — I69391 Dysphagia following cerebral infarction: Secondary | ICD-10-CM | POA: Diagnosis not present

## 2018-07-02 DIAGNOSIS — I38 Endocarditis, valve unspecified: Secondary | ICD-10-CM | POA: Diagnosis not present

## 2018-07-03 DIAGNOSIS — E785 Hyperlipidemia, unspecified: Secondary | ICD-10-CM | POA: Diagnosis not present

## 2018-07-03 DIAGNOSIS — M2550 Pain in unspecified joint: Secondary | ICD-10-CM | POA: Diagnosis not present

## 2018-07-03 DIAGNOSIS — R531 Weakness: Secondary | ICD-10-CM | POA: Diagnosis not present

## 2018-07-03 DIAGNOSIS — I69954 Hemiplegia and hemiparesis following unspecified cerebrovascular disease affecting left non-dominant side: Secondary | ICD-10-CM | POA: Diagnosis not present

## 2018-07-03 DIAGNOSIS — R55 Syncope and collapse: Secondary | ICD-10-CM | POA: Diagnosis not present

## 2018-07-03 DIAGNOSIS — I69354 Hemiplegia and hemiparesis following cerebral infarction affecting left non-dominant side: Secondary | ICD-10-CM | POA: Diagnosis not present

## 2018-07-03 DIAGNOSIS — I10 Essential (primary) hypertension: Secondary | ICD-10-CM | POA: Diagnosis not present

## 2018-07-03 DIAGNOSIS — E860 Dehydration: Secondary | ICD-10-CM | POA: Diagnosis not present

## 2018-07-03 DIAGNOSIS — N390 Urinary tract infection, site not specified: Secondary | ICD-10-CM | POA: Diagnosis not present

## 2018-07-03 DIAGNOSIS — I38 Endocarditis, valve unspecified: Secondary | ICD-10-CM | POA: Diagnosis not present

## 2018-07-03 DIAGNOSIS — S31829A Unspecified open wound of left buttock, initial encounter: Secondary | ICD-10-CM | POA: Diagnosis not present

## 2018-07-03 DIAGNOSIS — B9689 Other specified bacterial agents as the cause of diseases classified elsewhere: Secondary | ICD-10-CM | POA: Diagnosis not present

## 2018-07-03 DIAGNOSIS — F419 Anxiety disorder, unspecified: Secondary | ICD-10-CM | POA: Diagnosis not present

## 2018-07-03 DIAGNOSIS — I69391 Dysphagia following cerebral infarction: Secondary | ICD-10-CM | POA: Diagnosis not present

## 2018-07-03 DIAGNOSIS — R1312 Dysphagia, oropharyngeal phase: Secondary | ICD-10-CM | POA: Diagnosis not present

## 2018-07-03 DIAGNOSIS — Z7401 Bed confinement status: Secondary | ICD-10-CM | POA: Diagnosis not present

## 2018-07-04 DIAGNOSIS — I10 Essential (primary) hypertension: Secondary | ICD-10-CM | POA: Diagnosis not present

## 2018-07-04 DIAGNOSIS — I69354 Hemiplegia and hemiparesis following cerebral infarction affecting left non-dominant side: Secondary | ICD-10-CM | POA: Diagnosis not present

## 2018-07-04 DIAGNOSIS — I38 Endocarditis, valve unspecified: Secondary | ICD-10-CM | POA: Diagnosis not present

## 2018-07-04 DIAGNOSIS — R1312 Dysphagia, oropharyngeal phase: Secondary | ICD-10-CM | POA: Diagnosis not present

## 2018-07-04 DIAGNOSIS — I69391 Dysphagia following cerebral infarction: Secondary | ICD-10-CM | POA: Diagnosis not present

## 2018-07-04 DIAGNOSIS — S31829A Unspecified open wound of left buttock, initial encounter: Secondary | ICD-10-CM | POA: Diagnosis not present

## 2018-07-07 DIAGNOSIS — S31829A Unspecified open wound of left buttock, initial encounter: Secondary | ICD-10-CM | POA: Diagnosis not present

## 2018-07-07 DIAGNOSIS — I69391 Dysphagia following cerebral infarction: Secondary | ICD-10-CM | POA: Diagnosis not present

## 2018-07-07 DIAGNOSIS — I10 Essential (primary) hypertension: Secondary | ICD-10-CM | POA: Diagnosis not present

## 2018-07-07 DIAGNOSIS — I38 Endocarditis, valve unspecified: Secondary | ICD-10-CM | POA: Diagnosis not present

## 2018-07-07 DIAGNOSIS — R1312 Dysphagia, oropharyngeal phase: Secondary | ICD-10-CM | POA: Diagnosis not present

## 2018-07-07 DIAGNOSIS — I69354 Hemiplegia and hemiparesis following cerebral infarction affecting left non-dominant side: Secondary | ICD-10-CM | POA: Diagnosis not present

## 2018-07-08 DIAGNOSIS — S31829A Unspecified open wound of left buttock, initial encounter: Secondary | ICD-10-CM | POA: Diagnosis not present

## 2018-07-08 DIAGNOSIS — I69391 Dysphagia following cerebral infarction: Secondary | ICD-10-CM | POA: Diagnosis not present

## 2018-07-08 DIAGNOSIS — I38 Endocarditis, valve unspecified: Secondary | ICD-10-CM | POA: Diagnosis not present

## 2018-07-08 DIAGNOSIS — I10 Essential (primary) hypertension: Secondary | ICD-10-CM | POA: Diagnosis not present

## 2018-07-08 DIAGNOSIS — I69354 Hemiplegia and hemiparesis following cerebral infarction affecting left non-dominant side: Secondary | ICD-10-CM | POA: Diagnosis not present

## 2018-07-08 DIAGNOSIS — R1312 Dysphagia, oropharyngeal phase: Secondary | ICD-10-CM | POA: Diagnosis not present

## 2018-07-09 DIAGNOSIS — S31829A Unspecified open wound of left buttock, initial encounter: Secondary | ICD-10-CM | POA: Diagnosis not present

## 2018-07-09 DIAGNOSIS — R1312 Dysphagia, oropharyngeal phase: Secondary | ICD-10-CM | POA: Diagnosis not present

## 2018-07-09 DIAGNOSIS — I69391 Dysphagia following cerebral infarction: Secondary | ICD-10-CM | POA: Diagnosis not present

## 2018-07-09 DIAGNOSIS — I69354 Hemiplegia and hemiparesis following cerebral infarction affecting left non-dominant side: Secondary | ICD-10-CM | POA: Diagnosis not present

## 2018-07-09 DIAGNOSIS — I38 Endocarditis, valve unspecified: Secondary | ICD-10-CM | POA: Diagnosis not present

## 2018-07-09 DIAGNOSIS — I10 Essential (primary) hypertension: Secondary | ICD-10-CM | POA: Diagnosis not present

## 2018-07-10 DIAGNOSIS — R1312 Dysphagia, oropharyngeal phase: Secondary | ICD-10-CM | POA: Diagnosis not present

## 2018-07-10 DIAGNOSIS — I38 Endocarditis, valve unspecified: Secondary | ICD-10-CM | POA: Diagnosis not present

## 2018-07-10 DIAGNOSIS — I69354 Hemiplegia and hemiparesis following cerebral infarction affecting left non-dominant side: Secondary | ICD-10-CM | POA: Diagnosis not present

## 2018-07-10 DIAGNOSIS — I69391 Dysphagia following cerebral infarction: Secondary | ICD-10-CM | POA: Diagnosis not present

## 2018-07-10 DIAGNOSIS — S31829A Unspecified open wound of left buttock, initial encounter: Secondary | ICD-10-CM | POA: Diagnosis not present

## 2018-07-10 DIAGNOSIS — I10 Essential (primary) hypertension: Secondary | ICD-10-CM | POA: Diagnosis not present

## 2018-07-11 DIAGNOSIS — I10 Essential (primary) hypertension: Secondary | ICD-10-CM | POA: Diagnosis not present

## 2018-07-11 DIAGNOSIS — I69391 Dysphagia following cerebral infarction: Secondary | ICD-10-CM | POA: Diagnosis not present

## 2018-07-11 DIAGNOSIS — I38 Endocarditis, valve unspecified: Secondary | ICD-10-CM | POA: Diagnosis not present

## 2018-07-11 DIAGNOSIS — I69354 Hemiplegia and hemiparesis following cerebral infarction affecting left non-dominant side: Secondary | ICD-10-CM | POA: Diagnosis not present

## 2018-07-11 DIAGNOSIS — S31829A Unspecified open wound of left buttock, initial encounter: Secondary | ICD-10-CM | POA: Diagnosis not present

## 2018-07-11 DIAGNOSIS — R1312 Dysphagia, oropharyngeal phase: Secondary | ICD-10-CM | POA: Diagnosis not present

## 2018-07-15 DIAGNOSIS — I69354 Hemiplegia and hemiparesis following cerebral infarction affecting left non-dominant side: Secondary | ICD-10-CM | POA: Diagnosis not present

## 2018-07-15 DIAGNOSIS — S31829A Unspecified open wound of left buttock, initial encounter: Secondary | ICD-10-CM | POA: Diagnosis not present

## 2018-07-15 DIAGNOSIS — I10 Essential (primary) hypertension: Secondary | ICD-10-CM | POA: Diagnosis not present

## 2018-07-15 DIAGNOSIS — I38 Endocarditis, valve unspecified: Secondary | ICD-10-CM | POA: Diagnosis not present

## 2018-07-15 DIAGNOSIS — I69391 Dysphagia following cerebral infarction: Secondary | ICD-10-CM | POA: Diagnosis not present

## 2018-07-15 DIAGNOSIS — R1312 Dysphagia, oropharyngeal phase: Secondary | ICD-10-CM | POA: Diagnosis not present

## 2018-07-16 DIAGNOSIS — I69354 Hemiplegia and hemiparesis following cerebral infarction affecting left non-dominant side: Secondary | ICD-10-CM | POA: Diagnosis not present

## 2018-07-16 DIAGNOSIS — I10 Essential (primary) hypertension: Secondary | ICD-10-CM | POA: Diagnosis not present

## 2018-07-16 DIAGNOSIS — I69391 Dysphagia following cerebral infarction: Secondary | ICD-10-CM | POA: Diagnosis not present

## 2018-07-16 DIAGNOSIS — S31829A Unspecified open wound of left buttock, initial encounter: Secondary | ICD-10-CM | POA: Diagnosis not present

## 2018-07-16 DIAGNOSIS — R1312 Dysphagia, oropharyngeal phase: Secondary | ICD-10-CM | POA: Diagnosis not present

## 2018-07-16 DIAGNOSIS — I38 Endocarditis, valve unspecified: Secondary | ICD-10-CM | POA: Diagnosis not present

## 2018-07-17 DIAGNOSIS — R1312 Dysphagia, oropharyngeal phase: Secondary | ICD-10-CM | POA: Diagnosis not present

## 2018-07-17 DIAGNOSIS — I38 Endocarditis, valve unspecified: Secondary | ICD-10-CM | POA: Diagnosis not present

## 2018-07-17 DIAGNOSIS — I10 Essential (primary) hypertension: Secondary | ICD-10-CM | POA: Diagnosis not present

## 2018-07-17 DIAGNOSIS — I69354 Hemiplegia and hemiparesis following cerebral infarction affecting left non-dominant side: Secondary | ICD-10-CM | POA: Diagnosis not present

## 2018-07-17 DIAGNOSIS — S31829A Unspecified open wound of left buttock, initial encounter: Secondary | ICD-10-CM | POA: Diagnosis not present

## 2018-07-17 DIAGNOSIS — I69391 Dysphagia following cerebral infarction: Secondary | ICD-10-CM | POA: Diagnosis not present

## 2018-07-18 DIAGNOSIS — R1312 Dysphagia, oropharyngeal phase: Secondary | ICD-10-CM | POA: Diagnosis not present

## 2018-07-18 DIAGNOSIS — I10 Essential (primary) hypertension: Secondary | ICD-10-CM | POA: Diagnosis not present

## 2018-07-18 DIAGNOSIS — I38 Endocarditis, valve unspecified: Secondary | ICD-10-CM | POA: Diagnosis not present

## 2018-07-18 DIAGNOSIS — I69391 Dysphagia following cerebral infarction: Secondary | ICD-10-CM | POA: Diagnosis not present

## 2018-07-18 DIAGNOSIS — S31829A Unspecified open wound of left buttock, initial encounter: Secondary | ICD-10-CM | POA: Diagnosis not present

## 2018-07-18 DIAGNOSIS — I69354 Hemiplegia and hemiparesis following cerebral infarction affecting left non-dominant side: Secondary | ICD-10-CM | POA: Diagnosis not present

## 2018-07-21 DIAGNOSIS — S31829A Unspecified open wound of left buttock, initial encounter: Secondary | ICD-10-CM | POA: Diagnosis not present

## 2018-07-21 DIAGNOSIS — I38 Endocarditis, valve unspecified: Secondary | ICD-10-CM | POA: Diagnosis not present

## 2018-07-21 DIAGNOSIS — I10 Essential (primary) hypertension: Secondary | ICD-10-CM | POA: Diagnosis not present

## 2018-07-21 DIAGNOSIS — I69354 Hemiplegia and hemiparesis following cerebral infarction affecting left non-dominant side: Secondary | ICD-10-CM | POA: Diagnosis not present

## 2018-07-21 DIAGNOSIS — R1312 Dysphagia, oropharyngeal phase: Secondary | ICD-10-CM | POA: Diagnosis not present

## 2018-07-21 DIAGNOSIS — I69391 Dysphagia following cerebral infarction: Secondary | ICD-10-CM | POA: Diagnosis not present

## 2018-07-23 DIAGNOSIS — S31829A Unspecified open wound of left buttock, initial encounter: Secondary | ICD-10-CM | POA: Diagnosis not present

## 2018-07-23 DIAGNOSIS — I69354 Hemiplegia and hemiparesis following cerebral infarction affecting left non-dominant side: Secondary | ICD-10-CM | POA: Diagnosis not present

## 2018-07-23 DIAGNOSIS — I38 Endocarditis, valve unspecified: Secondary | ICD-10-CM | POA: Diagnosis not present

## 2018-07-23 DIAGNOSIS — I69391 Dysphagia following cerebral infarction: Secondary | ICD-10-CM | POA: Diagnosis not present

## 2018-07-23 DIAGNOSIS — R1312 Dysphagia, oropharyngeal phase: Secondary | ICD-10-CM | POA: Diagnosis not present

## 2018-07-23 DIAGNOSIS — I10 Essential (primary) hypertension: Secondary | ICD-10-CM | POA: Diagnosis not present

## 2018-07-24 DIAGNOSIS — I69354 Hemiplegia and hemiparesis following cerebral infarction affecting left non-dominant side: Secondary | ICD-10-CM | POA: Diagnosis not present

## 2018-07-24 DIAGNOSIS — S31829A Unspecified open wound of left buttock, initial encounter: Secondary | ICD-10-CM | POA: Diagnosis not present

## 2018-07-24 DIAGNOSIS — I69391 Dysphagia following cerebral infarction: Secondary | ICD-10-CM | POA: Diagnosis not present

## 2018-07-24 DIAGNOSIS — I38 Endocarditis, valve unspecified: Secondary | ICD-10-CM | POA: Diagnosis not present

## 2018-07-24 DIAGNOSIS — R1312 Dysphagia, oropharyngeal phase: Secondary | ICD-10-CM | POA: Diagnosis not present

## 2018-07-24 DIAGNOSIS — I10 Essential (primary) hypertension: Secondary | ICD-10-CM | POA: Diagnosis not present

## 2018-07-25 DIAGNOSIS — I10 Essential (primary) hypertension: Secondary | ICD-10-CM | POA: Diagnosis not present

## 2018-07-25 DIAGNOSIS — R1312 Dysphagia, oropharyngeal phase: Secondary | ICD-10-CM | POA: Diagnosis not present

## 2018-07-25 DIAGNOSIS — I69354 Hemiplegia and hemiparesis following cerebral infarction affecting left non-dominant side: Secondary | ICD-10-CM | POA: Diagnosis not present

## 2018-07-25 DIAGNOSIS — I69391 Dysphagia following cerebral infarction: Secondary | ICD-10-CM | POA: Diagnosis not present

## 2018-07-25 DIAGNOSIS — I38 Endocarditis, valve unspecified: Secondary | ICD-10-CM | POA: Diagnosis not present

## 2018-07-25 DIAGNOSIS — S31829A Unspecified open wound of left buttock, initial encounter: Secondary | ICD-10-CM | POA: Diagnosis not present

## 2018-07-28 DIAGNOSIS — I69391 Dysphagia following cerebral infarction: Secondary | ICD-10-CM | POA: Diagnosis not present

## 2018-07-28 DIAGNOSIS — I38 Endocarditis, valve unspecified: Secondary | ICD-10-CM | POA: Diagnosis not present

## 2018-07-28 DIAGNOSIS — R1312 Dysphagia, oropharyngeal phase: Secondary | ICD-10-CM | POA: Diagnosis not present

## 2018-07-28 DIAGNOSIS — I69354 Hemiplegia and hemiparesis following cerebral infarction affecting left non-dominant side: Secondary | ICD-10-CM | POA: Diagnosis not present

## 2018-07-28 DIAGNOSIS — S31829A Unspecified open wound of left buttock, initial encounter: Secondary | ICD-10-CM | POA: Diagnosis not present

## 2018-07-28 DIAGNOSIS — I10 Essential (primary) hypertension: Secondary | ICD-10-CM | POA: Diagnosis not present

## 2018-07-30 DIAGNOSIS — I69354 Hemiplegia and hemiparesis following cerebral infarction affecting left non-dominant side: Secondary | ICD-10-CM | POA: Diagnosis not present

## 2018-07-30 DIAGNOSIS — I69391 Dysphagia following cerebral infarction: Secondary | ICD-10-CM | POA: Diagnosis not present

## 2018-07-30 DIAGNOSIS — I38 Endocarditis, valve unspecified: Secondary | ICD-10-CM | POA: Diagnosis not present

## 2018-07-30 DIAGNOSIS — R1312 Dysphagia, oropharyngeal phase: Secondary | ICD-10-CM | POA: Diagnosis not present

## 2018-07-30 DIAGNOSIS — S31829A Unspecified open wound of left buttock, initial encounter: Secondary | ICD-10-CM | POA: Diagnosis not present

## 2018-07-30 DIAGNOSIS — I10 Essential (primary) hypertension: Secondary | ICD-10-CM | POA: Diagnosis not present

## 2018-07-31 DIAGNOSIS — I69354 Hemiplegia and hemiparesis following cerebral infarction affecting left non-dominant side: Secondary | ICD-10-CM | POA: Diagnosis not present

## 2018-07-31 DIAGNOSIS — I10 Essential (primary) hypertension: Secondary | ICD-10-CM | POA: Diagnosis not present

## 2018-07-31 DIAGNOSIS — I38 Endocarditis, valve unspecified: Secondary | ICD-10-CM | POA: Diagnosis not present

## 2018-07-31 DIAGNOSIS — I69391 Dysphagia following cerebral infarction: Secondary | ICD-10-CM | POA: Diagnosis not present

## 2018-07-31 DIAGNOSIS — R1312 Dysphagia, oropharyngeal phase: Secondary | ICD-10-CM | POA: Diagnosis not present

## 2018-07-31 DIAGNOSIS — S31829A Unspecified open wound of left buttock, initial encounter: Secondary | ICD-10-CM | POA: Diagnosis not present

## 2018-08-01 DIAGNOSIS — I38 Endocarditis, valve unspecified: Secondary | ICD-10-CM | POA: Diagnosis not present

## 2018-08-01 DIAGNOSIS — S31829A Unspecified open wound of left buttock, initial encounter: Secondary | ICD-10-CM | POA: Diagnosis not present

## 2018-08-01 DIAGNOSIS — I69391 Dysphagia following cerebral infarction: Secondary | ICD-10-CM | POA: Diagnosis not present

## 2018-08-01 DIAGNOSIS — I69354 Hemiplegia and hemiparesis following cerebral infarction affecting left non-dominant side: Secondary | ICD-10-CM | POA: Diagnosis not present

## 2018-08-01 DIAGNOSIS — I10 Essential (primary) hypertension: Secondary | ICD-10-CM | POA: Diagnosis not present

## 2018-08-01 DIAGNOSIS — R1312 Dysphagia, oropharyngeal phase: Secondary | ICD-10-CM | POA: Diagnosis not present

## 2018-08-04 DIAGNOSIS — I38 Endocarditis, valve unspecified: Secondary | ICD-10-CM | POA: Diagnosis not present

## 2018-08-04 DIAGNOSIS — I10 Essential (primary) hypertension: Secondary | ICD-10-CM | POA: Diagnosis not present

## 2018-08-04 DIAGNOSIS — R1312 Dysphagia, oropharyngeal phase: Secondary | ICD-10-CM | POA: Diagnosis not present

## 2018-08-04 DIAGNOSIS — S31829A Unspecified open wound of left buttock, initial encounter: Secondary | ICD-10-CM | POA: Diagnosis not present

## 2018-08-04 DIAGNOSIS — I69391 Dysphagia following cerebral infarction: Secondary | ICD-10-CM | POA: Diagnosis not present

## 2018-08-04 DIAGNOSIS — I69354 Hemiplegia and hemiparesis following cerebral infarction affecting left non-dominant side: Secondary | ICD-10-CM | POA: Diagnosis not present

## 2018-08-05 DIAGNOSIS — R1312 Dysphagia, oropharyngeal phase: Secondary | ICD-10-CM | POA: Diagnosis not present

## 2018-08-05 DIAGNOSIS — I69354 Hemiplegia and hemiparesis following cerebral infarction affecting left non-dominant side: Secondary | ICD-10-CM | POA: Diagnosis not present

## 2018-08-05 DIAGNOSIS — I38 Endocarditis, valve unspecified: Secondary | ICD-10-CM | POA: Diagnosis not present

## 2018-08-05 DIAGNOSIS — S31829A Unspecified open wound of left buttock, initial encounter: Secondary | ICD-10-CM | POA: Diagnosis not present

## 2018-08-05 DIAGNOSIS — I10 Essential (primary) hypertension: Secondary | ICD-10-CM | POA: Diagnosis not present

## 2018-08-05 DIAGNOSIS — I69391 Dysphagia following cerebral infarction: Secondary | ICD-10-CM | POA: Diagnosis not present

## 2018-08-06 DIAGNOSIS — I38 Endocarditis, valve unspecified: Secondary | ICD-10-CM | POA: Diagnosis not present

## 2018-08-06 DIAGNOSIS — I10 Essential (primary) hypertension: Secondary | ICD-10-CM | POA: Diagnosis not present

## 2018-08-06 DIAGNOSIS — I69391 Dysphagia following cerebral infarction: Secondary | ICD-10-CM | POA: Diagnosis not present

## 2018-08-06 DIAGNOSIS — I69354 Hemiplegia and hemiparesis following cerebral infarction affecting left non-dominant side: Secondary | ICD-10-CM | POA: Diagnosis not present

## 2018-08-06 DIAGNOSIS — R1312 Dysphagia, oropharyngeal phase: Secondary | ICD-10-CM | POA: Diagnosis not present

## 2018-08-06 DIAGNOSIS — S31829A Unspecified open wound of left buttock, initial encounter: Secondary | ICD-10-CM | POA: Diagnosis not present

## 2018-08-08 DIAGNOSIS — I69391 Dysphagia following cerebral infarction: Secondary | ICD-10-CM | POA: Diagnosis not present

## 2018-08-08 DIAGNOSIS — I69354 Hemiplegia and hemiparesis following cerebral infarction affecting left non-dominant side: Secondary | ICD-10-CM | POA: Diagnosis not present

## 2018-08-08 DIAGNOSIS — R1312 Dysphagia, oropharyngeal phase: Secondary | ICD-10-CM | POA: Diagnosis not present

## 2018-08-08 DIAGNOSIS — I38 Endocarditis, valve unspecified: Secondary | ICD-10-CM | POA: Diagnosis not present

## 2018-08-08 DIAGNOSIS — S31829A Unspecified open wound of left buttock, initial encounter: Secondary | ICD-10-CM | POA: Diagnosis not present

## 2018-08-08 DIAGNOSIS — I10 Essential (primary) hypertension: Secondary | ICD-10-CM | POA: Diagnosis not present

## 2018-08-11 DIAGNOSIS — R1312 Dysphagia, oropharyngeal phase: Secondary | ICD-10-CM | POA: Diagnosis not present

## 2018-08-11 DIAGNOSIS — I69391 Dysphagia following cerebral infarction: Secondary | ICD-10-CM | POA: Diagnosis not present

## 2018-08-11 DIAGNOSIS — I69354 Hemiplegia and hemiparesis following cerebral infarction affecting left non-dominant side: Secondary | ICD-10-CM | POA: Diagnosis not present

## 2018-08-11 DIAGNOSIS — I38 Endocarditis, valve unspecified: Secondary | ICD-10-CM | POA: Diagnosis not present

## 2018-08-11 DIAGNOSIS — I10 Essential (primary) hypertension: Secondary | ICD-10-CM | POA: Diagnosis not present

## 2018-08-11 DIAGNOSIS — S31829A Unspecified open wound of left buttock, initial encounter: Secondary | ICD-10-CM | POA: Diagnosis not present

## 2018-08-13 DIAGNOSIS — I10 Essential (primary) hypertension: Secondary | ICD-10-CM | POA: Diagnosis not present

## 2018-08-13 DIAGNOSIS — I38 Endocarditis, valve unspecified: Secondary | ICD-10-CM | POA: Diagnosis not present

## 2018-08-13 DIAGNOSIS — R1312 Dysphagia, oropharyngeal phase: Secondary | ICD-10-CM | POA: Diagnosis not present

## 2018-08-13 DIAGNOSIS — I69391 Dysphagia following cerebral infarction: Secondary | ICD-10-CM | POA: Diagnosis not present

## 2018-08-13 DIAGNOSIS — I69354 Hemiplegia and hemiparesis following cerebral infarction affecting left non-dominant side: Secondary | ICD-10-CM | POA: Diagnosis not present

## 2018-08-13 DIAGNOSIS — S31829A Unspecified open wound of left buttock, initial encounter: Secondary | ICD-10-CM | POA: Diagnosis not present

## 2018-08-14 DIAGNOSIS — S31829A Unspecified open wound of left buttock, initial encounter: Secondary | ICD-10-CM | POA: Diagnosis not present

## 2018-08-14 DIAGNOSIS — I38 Endocarditis, valve unspecified: Secondary | ICD-10-CM | POA: Diagnosis not present

## 2018-08-14 DIAGNOSIS — I69354 Hemiplegia and hemiparesis following cerebral infarction affecting left non-dominant side: Secondary | ICD-10-CM | POA: Diagnosis not present

## 2018-08-14 DIAGNOSIS — R1312 Dysphagia, oropharyngeal phase: Secondary | ICD-10-CM | POA: Diagnosis not present

## 2018-08-14 DIAGNOSIS — I10 Essential (primary) hypertension: Secondary | ICD-10-CM | POA: Diagnosis not present

## 2018-08-14 DIAGNOSIS — I69391 Dysphagia following cerebral infarction: Secondary | ICD-10-CM | POA: Diagnosis not present

## 2018-08-15 DIAGNOSIS — I10 Essential (primary) hypertension: Secondary | ICD-10-CM | POA: Diagnosis not present

## 2018-08-15 DIAGNOSIS — R1312 Dysphagia, oropharyngeal phase: Secondary | ICD-10-CM | POA: Diagnosis not present

## 2018-08-15 DIAGNOSIS — I69391 Dysphagia following cerebral infarction: Secondary | ICD-10-CM | POA: Diagnosis not present

## 2018-08-15 DIAGNOSIS — S31829A Unspecified open wound of left buttock, initial encounter: Secondary | ICD-10-CM | POA: Diagnosis not present

## 2018-08-15 DIAGNOSIS — I69354 Hemiplegia and hemiparesis following cerebral infarction affecting left non-dominant side: Secondary | ICD-10-CM | POA: Diagnosis not present

## 2018-08-15 DIAGNOSIS — I38 Endocarditis, valve unspecified: Secondary | ICD-10-CM | POA: Diagnosis not present

## 2018-08-18 DIAGNOSIS — I69391 Dysphagia following cerebral infarction: Secondary | ICD-10-CM | POA: Diagnosis not present

## 2018-08-18 DIAGNOSIS — I10 Essential (primary) hypertension: Secondary | ICD-10-CM | POA: Diagnosis not present

## 2018-08-18 DIAGNOSIS — S31829A Unspecified open wound of left buttock, initial encounter: Secondary | ICD-10-CM | POA: Diagnosis not present

## 2018-08-18 DIAGNOSIS — I69354 Hemiplegia and hemiparesis following cerebral infarction affecting left non-dominant side: Secondary | ICD-10-CM | POA: Diagnosis not present

## 2018-08-18 DIAGNOSIS — I38 Endocarditis, valve unspecified: Secondary | ICD-10-CM | POA: Diagnosis not present

## 2018-08-18 DIAGNOSIS — R1312 Dysphagia, oropharyngeal phase: Secondary | ICD-10-CM | POA: Diagnosis not present

## 2018-08-20 DIAGNOSIS — S31829A Unspecified open wound of left buttock, initial encounter: Secondary | ICD-10-CM | POA: Diagnosis not present

## 2018-08-20 DIAGNOSIS — I38 Endocarditis, valve unspecified: Secondary | ICD-10-CM | POA: Diagnosis not present

## 2018-08-20 DIAGNOSIS — R1312 Dysphagia, oropharyngeal phase: Secondary | ICD-10-CM | POA: Diagnosis not present

## 2018-08-20 DIAGNOSIS — I69391 Dysphagia following cerebral infarction: Secondary | ICD-10-CM | POA: Diagnosis not present

## 2018-08-20 DIAGNOSIS — I10 Essential (primary) hypertension: Secondary | ICD-10-CM | POA: Diagnosis not present

## 2018-08-20 DIAGNOSIS — I69354 Hemiplegia and hemiparesis following cerebral infarction affecting left non-dominant side: Secondary | ICD-10-CM | POA: Diagnosis not present

## 2018-08-21 DIAGNOSIS — I69354 Hemiplegia and hemiparesis following cerebral infarction affecting left non-dominant side: Secondary | ICD-10-CM | POA: Diagnosis not present

## 2018-08-21 DIAGNOSIS — S31829A Unspecified open wound of left buttock, initial encounter: Secondary | ICD-10-CM | POA: Diagnosis not present

## 2018-08-21 DIAGNOSIS — I69391 Dysphagia following cerebral infarction: Secondary | ICD-10-CM | POA: Diagnosis not present

## 2018-08-21 DIAGNOSIS — I10 Essential (primary) hypertension: Secondary | ICD-10-CM | POA: Diagnosis not present

## 2018-08-21 DIAGNOSIS — R1312 Dysphagia, oropharyngeal phase: Secondary | ICD-10-CM | POA: Diagnosis not present

## 2018-08-21 DIAGNOSIS — I38 Endocarditis, valve unspecified: Secondary | ICD-10-CM | POA: Diagnosis not present

## 2018-08-22 DIAGNOSIS — I38 Endocarditis, valve unspecified: Secondary | ICD-10-CM | POA: Diagnosis not present

## 2018-08-22 DIAGNOSIS — S31829A Unspecified open wound of left buttock, initial encounter: Secondary | ICD-10-CM | POA: Diagnosis not present

## 2018-08-22 DIAGNOSIS — I69354 Hemiplegia and hemiparesis following cerebral infarction affecting left non-dominant side: Secondary | ICD-10-CM | POA: Diagnosis not present

## 2018-08-22 DIAGNOSIS — I69391 Dysphagia following cerebral infarction: Secondary | ICD-10-CM | POA: Diagnosis not present

## 2018-08-22 DIAGNOSIS — I10 Essential (primary) hypertension: Secondary | ICD-10-CM | POA: Diagnosis not present

## 2018-08-22 DIAGNOSIS — R1312 Dysphagia, oropharyngeal phase: Secondary | ICD-10-CM | POA: Diagnosis not present

## 2018-08-25 DIAGNOSIS — R1312 Dysphagia, oropharyngeal phase: Secondary | ICD-10-CM | POA: Diagnosis not present

## 2018-08-25 DIAGNOSIS — I69391 Dysphagia following cerebral infarction: Secondary | ICD-10-CM | POA: Diagnosis not present

## 2018-08-25 DIAGNOSIS — I69354 Hemiplegia and hemiparesis following cerebral infarction affecting left non-dominant side: Secondary | ICD-10-CM | POA: Diagnosis not present

## 2018-08-25 DIAGNOSIS — Z431 Encounter for attention to gastrostomy: Secondary | ICD-10-CM | POA: Diagnosis not present

## 2018-08-25 DIAGNOSIS — Z9181 History of falling: Secondary | ICD-10-CM | POA: Diagnosis not present

## 2018-08-25 DIAGNOSIS — Z87440 Personal history of urinary (tract) infections: Secondary | ICD-10-CM | POA: Diagnosis not present

## 2018-08-25 DIAGNOSIS — Z8701 Personal history of pneumonia (recurrent): Secondary | ICD-10-CM | POA: Diagnosis not present

## 2018-08-25 DIAGNOSIS — I10 Essential (primary) hypertension: Secondary | ICD-10-CM | POA: Diagnosis not present

## 2018-08-25 DIAGNOSIS — F329 Major depressive disorder, single episode, unspecified: Secondary | ICD-10-CM | POA: Diagnosis not present

## 2018-08-25 DIAGNOSIS — R32 Unspecified urinary incontinence: Secondary | ICD-10-CM | POA: Diagnosis not present

## 2018-08-25 DIAGNOSIS — D649 Anemia, unspecified: Secondary | ICD-10-CM | POA: Diagnosis not present

## 2018-08-25 DIAGNOSIS — Z90710 Acquired absence of both cervix and uterus: Secondary | ICD-10-CM | POA: Diagnosis not present

## 2018-08-25 DIAGNOSIS — F419 Anxiety disorder, unspecified: Secondary | ICD-10-CM | POA: Diagnosis not present

## 2018-08-25 DIAGNOSIS — M150 Primary generalized (osteo)arthritis: Secondary | ICD-10-CM | POA: Diagnosis not present

## 2018-08-25 DIAGNOSIS — I38 Endocarditis, valve unspecified: Secondary | ICD-10-CM | POA: Diagnosis not present

## 2018-08-25 DIAGNOSIS — I4891 Unspecified atrial fibrillation: Secondary | ICD-10-CM | POA: Diagnosis not present

## 2018-08-25 DIAGNOSIS — Z7901 Long term (current) use of anticoagulants: Secondary | ICD-10-CM | POA: Diagnosis not present

## 2018-08-29 DIAGNOSIS — I4891 Unspecified atrial fibrillation: Secondary | ICD-10-CM | POA: Diagnosis not present

## 2018-08-29 DIAGNOSIS — I69354 Hemiplegia and hemiparesis following cerebral infarction affecting left non-dominant side: Secondary | ICD-10-CM | POA: Diagnosis not present

## 2018-08-29 DIAGNOSIS — I69391 Dysphagia following cerebral infarction: Secondary | ICD-10-CM | POA: Diagnosis not present

## 2018-08-29 DIAGNOSIS — I38 Endocarditis, valve unspecified: Secondary | ICD-10-CM | POA: Diagnosis not present

## 2018-08-29 DIAGNOSIS — I10 Essential (primary) hypertension: Secondary | ICD-10-CM | POA: Diagnosis not present

## 2018-08-29 DIAGNOSIS — R1312 Dysphagia, oropharyngeal phase: Secondary | ICD-10-CM | POA: Diagnosis not present

## 2018-09-02 DIAGNOSIS — I4891 Unspecified atrial fibrillation: Secondary | ICD-10-CM | POA: Diagnosis not present

## 2018-09-02 DIAGNOSIS — I10 Essential (primary) hypertension: Secondary | ICD-10-CM | POA: Diagnosis not present

## 2018-09-02 DIAGNOSIS — I69354 Hemiplegia and hemiparesis following cerebral infarction affecting left non-dominant side: Secondary | ICD-10-CM | POA: Diagnosis not present

## 2018-09-02 DIAGNOSIS — I69391 Dysphagia following cerebral infarction: Secondary | ICD-10-CM | POA: Diagnosis not present

## 2018-09-02 DIAGNOSIS — I38 Endocarditis, valve unspecified: Secondary | ICD-10-CM | POA: Diagnosis not present

## 2018-09-02 DIAGNOSIS — R1312 Dysphagia, oropharyngeal phase: Secondary | ICD-10-CM | POA: Diagnosis not present

## 2018-09-12 DIAGNOSIS — I69354 Hemiplegia and hemiparesis following cerebral infarction affecting left non-dominant side: Secondary | ICD-10-CM | POA: Diagnosis not present

## 2018-09-12 DIAGNOSIS — I4891 Unspecified atrial fibrillation: Secondary | ICD-10-CM | POA: Diagnosis not present

## 2018-09-12 DIAGNOSIS — I10 Essential (primary) hypertension: Secondary | ICD-10-CM | POA: Diagnosis not present

## 2018-09-12 DIAGNOSIS — R1312 Dysphagia, oropharyngeal phase: Secondary | ICD-10-CM | POA: Diagnosis not present

## 2018-09-12 DIAGNOSIS — I38 Endocarditis, valve unspecified: Secondary | ICD-10-CM | POA: Diagnosis not present

## 2018-09-12 DIAGNOSIS — I69391 Dysphagia following cerebral infarction: Secondary | ICD-10-CM | POA: Diagnosis not present

## 2018-09-16 DIAGNOSIS — I10 Essential (primary) hypertension: Secondary | ICD-10-CM | POA: Diagnosis not present

## 2018-09-16 DIAGNOSIS — I4891 Unspecified atrial fibrillation: Secondary | ICD-10-CM | POA: Diagnosis not present

## 2018-09-16 DIAGNOSIS — I38 Endocarditis, valve unspecified: Secondary | ICD-10-CM | POA: Diagnosis not present

## 2018-09-16 DIAGNOSIS — R1312 Dysphagia, oropharyngeal phase: Secondary | ICD-10-CM | POA: Diagnosis not present

## 2018-09-16 DIAGNOSIS — I69391 Dysphagia following cerebral infarction: Secondary | ICD-10-CM | POA: Diagnosis not present

## 2018-09-16 DIAGNOSIS — I69354 Hemiplegia and hemiparesis following cerebral infarction affecting left non-dominant side: Secondary | ICD-10-CM | POA: Diagnosis not present

## 2018-09-18 DIAGNOSIS — I69354 Hemiplegia and hemiparesis following cerebral infarction affecting left non-dominant side: Secondary | ICD-10-CM | POA: Diagnosis not present

## 2018-09-18 DIAGNOSIS — R1312 Dysphagia, oropharyngeal phase: Secondary | ICD-10-CM | POA: Diagnosis not present

## 2018-09-18 DIAGNOSIS — I4891 Unspecified atrial fibrillation: Secondary | ICD-10-CM | POA: Diagnosis not present

## 2018-09-18 DIAGNOSIS — I10 Essential (primary) hypertension: Secondary | ICD-10-CM | POA: Diagnosis not present

## 2018-09-18 DIAGNOSIS — I69391 Dysphagia following cerebral infarction: Secondary | ICD-10-CM | POA: Diagnosis not present

## 2018-09-18 DIAGNOSIS — I38 Endocarditis, valve unspecified: Secondary | ICD-10-CM | POA: Diagnosis not present

## 2018-09-22 DIAGNOSIS — I69391 Dysphagia following cerebral infarction: Secondary | ICD-10-CM | POA: Diagnosis not present

## 2018-09-22 DIAGNOSIS — I69354 Hemiplegia and hemiparesis following cerebral infarction affecting left non-dominant side: Secondary | ICD-10-CM | POA: Diagnosis not present

## 2018-09-22 DIAGNOSIS — R1312 Dysphagia, oropharyngeal phase: Secondary | ICD-10-CM | POA: Diagnosis not present

## 2018-09-22 DIAGNOSIS — I10 Essential (primary) hypertension: Secondary | ICD-10-CM | POA: Diagnosis not present

## 2018-09-22 DIAGNOSIS — I38 Endocarditis, valve unspecified: Secondary | ICD-10-CM | POA: Diagnosis not present

## 2018-09-22 DIAGNOSIS — I4891 Unspecified atrial fibrillation: Secondary | ICD-10-CM | POA: Diagnosis not present

## 2018-09-24 DIAGNOSIS — R1312 Dysphagia, oropharyngeal phase: Secondary | ICD-10-CM | POA: Diagnosis not present

## 2018-09-24 DIAGNOSIS — I4891 Unspecified atrial fibrillation: Secondary | ICD-10-CM | POA: Diagnosis not present

## 2018-09-24 DIAGNOSIS — I38 Endocarditis, valve unspecified: Secondary | ICD-10-CM | POA: Diagnosis not present

## 2018-09-24 DIAGNOSIS — I69354 Hemiplegia and hemiparesis following cerebral infarction affecting left non-dominant side: Secondary | ICD-10-CM | POA: Diagnosis not present

## 2018-09-24 DIAGNOSIS — I10 Essential (primary) hypertension: Secondary | ICD-10-CM | POA: Diagnosis not present

## 2018-09-24 DIAGNOSIS — I69391 Dysphagia following cerebral infarction: Secondary | ICD-10-CM | POA: Diagnosis not present

## 2018-09-30 DIAGNOSIS — I10 Essential (primary) hypertension: Secondary | ICD-10-CM | POA: Diagnosis not present

## 2018-09-30 DIAGNOSIS — I69354 Hemiplegia and hemiparesis following cerebral infarction affecting left non-dominant side: Secondary | ICD-10-CM | POA: Diagnosis not present

## 2018-09-30 DIAGNOSIS — I38 Endocarditis, valve unspecified: Secondary | ICD-10-CM | POA: Diagnosis not present

## 2018-09-30 DIAGNOSIS — I69391 Dysphagia following cerebral infarction: Secondary | ICD-10-CM | POA: Diagnosis not present

## 2018-09-30 DIAGNOSIS — R1312 Dysphagia, oropharyngeal phase: Secondary | ICD-10-CM | POA: Diagnosis not present

## 2018-09-30 DIAGNOSIS — I4891 Unspecified atrial fibrillation: Secondary | ICD-10-CM | POA: Diagnosis not present

## 2018-10-02 DIAGNOSIS — I69354 Hemiplegia and hemiparesis following cerebral infarction affecting left non-dominant side: Secondary | ICD-10-CM | POA: Diagnosis not present

## 2018-10-02 DIAGNOSIS — I4891 Unspecified atrial fibrillation: Secondary | ICD-10-CM | POA: Diagnosis not present

## 2018-10-02 DIAGNOSIS — R1312 Dysphagia, oropharyngeal phase: Secondary | ICD-10-CM | POA: Diagnosis not present

## 2018-10-02 DIAGNOSIS — I69391 Dysphagia following cerebral infarction: Secondary | ICD-10-CM | POA: Diagnosis not present

## 2018-10-02 DIAGNOSIS — I38 Endocarditis, valve unspecified: Secondary | ICD-10-CM | POA: Diagnosis not present

## 2018-10-02 DIAGNOSIS — I10 Essential (primary) hypertension: Secondary | ICD-10-CM | POA: Diagnosis not present

## 2018-10-10 DIAGNOSIS — R1312 Dysphagia, oropharyngeal phase: Secondary | ICD-10-CM | POA: Diagnosis not present

## 2018-10-10 DIAGNOSIS — I4891 Unspecified atrial fibrillation: Secondary | ICD-10-CM | POA: Diagnosis not present

## 2018-10-10 DIAGNOSIS — I38 Endocarditis, valve unspecified: Secondary | ICD-10-CM | POA: Diagnosis not present

## 2018-10-10 DIAGNOSIS — I10 Essential (primary) hypertension: Secondary | ICD-10-CM | POA: Diagnosis not present

## 2018-10-10 DIAGNOSIS — I69391 Dysphagia following cerebral infarction: Secondary | ICD-10-CM | POA: Diagnosis not present

## 2018-10-10 DIAGNOSIS — I69354 Hemiplegia and hemiparesis following cerebral infarction affecting left non-dominant side: Secondary | ICD-10-CM | POA: Diagnosis not present

## 2018-10-13 DIAGNOSIS — I10 Essential (primary) hypertension: Secondary | ICD-10-CM | POA: Diagnosis not present

## 2018-10-13 DIAGNOSIS — I69391 Dysphagia following cerebral infarction: Secondary | ICD-10-CM | POA: Diagnosis not present

## 2018-10-13 DIAGNOSIS — I38 Endocarditis, valve unspecified: Secondary | ICD-10-CM | POA: Diagnosis not present

## 2018-10-13 DIAGNOSIS — I4891 Unspecified atrial fibrillation: Secondary | ICD-10-CM | POA: Diagnosis not present

## 2018-10-13 DIAGNOSIS — R1312 Dysphagia, oropharyngeal phase: Secondary | ICD-10-CM | POA: Diagnosis not present

## 2018-10-13 DIAGNOSIS — I69354 Hemiplegia and hemiparesis following cerebral infarction affecting left non-dominant side: Secondary | ICD-10-CM | POA: Diagnosis not present

## 2018-10-17 DIAGNOSIS — I4891 Unspecified atrial fibrillation: Secondary | ICD-10-CM | POA: Diagnosis not present

## 2018-10-17 DIAGNOSIS — R1312 Dysphagia, oropharyngeal phase: Secondary | ICD-10-CM | POA: Diagnosis not present

## 2018-10-17 DIAGNOSIS — I10 Essential (primary) hypertension: Secondary | ICD-10-CM | POA: Diagnosis not present

## 2018-10-17 DIAGNOSIS — I69354 Hemiplegia and hemiparesis following cerebral infarction affecting left non-dominant side: Secondary | ICD-10-CM | POA: Diagnosis not present

## 2018-10-17 DIAGNOSIS — I69391 Dysphagia following cerebral infarction: Secondary | ICD-10-CM | POA: Diagnosis not present

## 2018-10-17 DIAGNOSIS — I38 Endocarditis, valve unspecified: Secondary | ICD-10-CM | POA: Diagnosis not present

## 2018-10-21 DIAGNOSIS — I69391 Dysphagia following cerebral infarction: Secondary | ICD-10-CM | POA: Diagnosis not present

## 2018-10-21 DIAGNOSIS — I4891 Unspecified atrial fibrillation: Secondary | ICD-10-CM | POA: Diagnosis not present

## 2018-10-21 DIAGNOSIS — I38 Endocarditis, valve unspecified: Secondary | ICD-10-CM | POA: Diagnosis not present

## 2018-10-21 DIAGNOSIS — I69354 Hemiplegia and hemiparesis following cerebral infarction affecting left non-dominant side: Secondary | ICD-10-CM | POA: Diagnosis not present

## 2018-10-21 DIAGNOSIS — I10 Essential (primary) hypertension: Secondary | ICD-10-CM | POA: Diagnosis not present

## 2018-10-21 DIAGNOSIS — R1312 Dysphagia, oropharyngeal phase: Secondary | ICD-10-CM | POA: Diagnosis not present

## 2018-10-23 DIAGNOSIS — I38 Endocarditis, valve unspecified: Secondary | ICD-10-CM | POA: Diagnosis not present

## 2018-10-23 DIAGNOSIS — I69354 Hemiplegia and hemiparesis following cerebral infarction affecting left non-dominant side: Secondary | ICD-10-CM | POA: Diagnosis not present

## 2018-10-23 DIAGNOSIS — R1312 Dysphagia, oropharyngeal phase: Secondary | ICD-10-CM | POA: Diagnosis not present

## 2018-10-23 DIAGNOSIS — I69391 Dysphagia following cerebral infarction: Secondary | ICD-10-CM | POA: Diagnosis not present

## 2018-10-23 DIAGNOSIS — I4891 Unspecified atrial fibrillation: Secondary | ICD-10-CM | POA: Diagnosis not present

## 2018-10-23 DIAGNOSIS — I10 Essential (primary) hypertension: Secondary | ICD-10-CM | POA: Diagnosis not present

## 2018-11-17 DIAGNOSIS — I69391 Dysphagia following cerebral infarction: Secondary | ICD-10-CM | POA: Diagnosis not present

## 2018-11-17 DIAGNOSIS — Z23 Encounter for immunization: Secondary | ICD-10-CM | POA: Diagnosis not present

## 2018-11-17 DIAGNOSIS — I10 Essential (primary) hypertension: Secondary | ICD-10-CM | POA: Diagnosis not present

## 2018-11-17 DIAGNOSIS — I480 Paroxysmal atrial fibrillation: Secondary | ICD-10-CM | POA: Diagnosis not present

## 2018-11-17 DIAGNOSIS — E785 Hyperlipidemia, unspecified: Secondary | ICD-10-CM | POA: Diagnosis not present

## 2018-11-17 DIAGNOSIS — E039 Hypothyroidism, unspecified: Secondary | ICD-10-CM | POA: Diagnosis not present

## 2018-11-17 DIAGNOSIS — I69354 Hemiplegia and hemiparesis following cerebral infarction affecting left non-dominant side: Secondary | ICD-10-CM | POA: Diagnosis not present

## 2018-12-23 DIAGNOSIS — N390 Urinary tract infection, site not specified: Secondary | ICD-10-CM | POA: Diagnosis not present

## 2019-02-18 DIAGNOSIS — I69354 Hemiplegia and hemiparesis following cerebral infarction affecting left non-dominant side: Secondary | ICD-10-CM | POA: Diagnosis not present

## 2019-02-18 DIAGNOSIS — S31819A Unspecified open wound of right buttock, initial encounter: Secondary | ICD-10-CM | POA: Diagnosis not present

## 2019-02-18 DIAGNOSIS — S31829A Unspecified open wound of left buttock, initial encounter: Secondary | ICD-10-CM | POA: Diagnosis not present

## 2019-02-18 DIAGNOSIS — I480 Paroxysmal atrial fibrillation: Secondary | ICD-10-CM | POA: Diagnosis not present

## 2019-02-20 DIAGNOSIS — Z431 Encounter for attention to gastrostomy: Secondary | ICD-10-CM | POA: Diagnosis not present

## 2019-02-20 DIAGNOSIS — E785 Hyperlipidemia, unspecified: Secondary | ICD-10-CM | POA: Diagnosis not present

## 2019-02-20 DIAGNOSIS — I69391 Dysphagia following cerebral infarction: Secondary | ICD-10-CM | POA: Diagnosis not present

## 2019-02-20 DIAGNOSIS — I10 Essential (primary) hypertension: Secondary | ICD-10-CM | POA: Diagnosis not present

## 2019-02-20 DIAGNOSIS — I352 Nonrheumatic aortic (valve) stenosis with insufficiency: Secondary | ICD-10-CM | POA: Diagnosis not present

## 2019-02-20 DIAGNOSIS — I38 Endocarditis, valve unspecified: Secondary | ICD-10-CM | POA: Diagnosis not present

## 2019-02-20 DIAGNOSIS — R32 Unspecified urinary incontinence: Secondary | ICD-10-CM | POA: Diagnosis not present

## 2019-02-20 DIAGNOSIS — I69354 Hemiplegia and hemiparesis following cerebral infarction affecting left non-dominant side: Secondary | ICD-10-CM | POA: Diagnosis not present

## 2019-02-20 DIAGNOSIS — M150 Primary generalized (osteo)arthritis: Secondary | ICD-10-CM | POA: Diagnosis not present

## 2019-02-20 DIAGNOSIS — E039 Hypothyroidism, unspecified: Secondary | ICD-10-CM | POA: Diagnosis not present

## 2019-02-20 DIAGNOSIS — Z8701 Personal history of pneumonia (recurrent): Secondary | ICD-10-CM | POA: Diagnosis not present

## 2019-02-20 DIAGNOSIS — D649 Anemia, unspecified: Secondary | ICD-10-CM | POA: Diagnosis not present

## 2019-02-20 DIAGNOSIS — Z7901 Long term (current) use of anticoagulants: Secondary | ICD-10-CM | POA: Diagnosis not present

## 2019-02-20 DIAGNOSIS — M810 Age-related osteoporosis without current pathological fracture: Secondary | ICD-10-CM | POA: Diagnosis not present

## 2019-02-20 DIAGNOSIS — Z9181 History of falling: Secondary | ICD-10-CM | POA: Diagnosis not present

## 2019-02-20 DIAGNOSIS — Z90710 Acquired absence of both cervix and uterus: Secondary | ICD-10-CM | POA: Diagnosis not present

## 2019-02-20 DIAGNOSIS — K219 Gastro-esophageal reflux disease without esophagitis: Secondary | ICD-10-CM | POA: Diagnosis not present

## 2019-02-20 DIAGNOSIS — E559 Vitamin D deficiency, unspecified: Secondary | ICD-10-CM | POA: Diagnosis not present

## 2019-02-20 DIAGNOSIS — F419 Anxiety disorder, unspecified: Secondary | ICD-10-CM | POA: Diagnosis not present

## 2019-02-20 DIAGNOSIS — L89312 Pressure ulcer of right buttock, stage 2: Secondary | ICD-10-CM | POA: Diagnosis not present

## 2019-02-20 DIAGNOSIS — I4891 Unspecified atrial fibrillation: Secondary | ICD-10-CM | POA: Diagnosis not present

## 2019-02-20 DIAGNOSIS — F329 Major depressive disorder, single episode, unspecified: Secondary | ICD-10-CM | POA: Diagnosis not present

## 2019-02-20 DIAGNOSIS — L89322 Pressure ulcer of left buttock, stage 2: Secondary | ICD-10-CM | POA: Diagnosis not present

## 2019-02-20 DIAGNOSIS — R1312 Dysphagia, oropharyngeal phase: Secondary | ICD-10-CM | POA: Diagnosis not present

## 2019-02-20 DIAGNOSIS — Z87440 Personal history of urinary (tract) infections: Secondary | ICD-10-CM | POA: Diagnosis not present

## 2019-02-24 DIAGNOSIS — R1312 Dysphagia, oropharyngeal phase: Secondary | ICD-10-CM | POA: Diagnosis not present

## 2019-02-24 DIAGNOSIS — I69354 Hemiplegia and hemiparesis following cerebral infarction affecting left non-dominant side: Secondary | ICD-10-CM | POA: Diagnosis not present

## 2019-02-24 DIAGNOSIS — I38 Endocarditis, valve unspecified: Secondary | ICD-10-CM | POA: Diagnosis not present

## 2019-02-24 DIAGNOSIS — I69391 Dysphagia following cerebral infarction: Secondary | ICD-10-CM | POA: Diagnosis not present

## 2019-02-24 DIAGNOSIS — L89312 Pressure ulcer of right buttock, stage 2: Secondary | ICD-10-CM | POA: Diagnosis not present

## 2019-02-24 DIAGNOSIS — L89322 Pressure ulcer of left buttock, stage 2: Secondary | ICD-10-CM | POA: Diagnosis not present

## 2019-03-05 DIAGNOSIS — I69391 Dysphagia following cerebral infarction: Secondary | ICD-10-CM | POA: Diagnosis not present

## 2019-03-05 DIAGNOSIS — L89312 Pressure ulcer of right buttock, stage 2: Secondary | ICD-10-CM | POA: Diagnosis not present

## 2019-03-05 DIAGNOSIS — R1312 Dysphagia, oropharyngeal phase: Secondary | ICD-10-CM | POA: Diagnosis not present

## 2019-03-05 DIAGNOSIS — L89322 Pressure ulcer of left buttock, stage 2: Secondary | ICD-10-CM | POA: Diagnosis not present

## 2019-03-05 DIAGNOSIS — I69354 Hemiplegia and hemiparesis following cerebral infarction affecting left non-dominant side: Secondary | ICD-10-CM | POA: Diagnosis not present

## 2019-03-05 DIAGNOSIS — I38 Endocarditis, valve unspecified: Secondary | ICD-10-CM | POA: Diagnosis not present

## 2019-03-12 DIAGNOSIS — I69391 Dysphagia following cerebral infarction: Secondary | ICD-10-CM | POA: Diagnosis not present

## 2019-03-12 DIAGNOSIS — R1312 Dysphagia, oropharyngeal phase: Secondary | ICD-10-CM | POA: Diagnosis not present

## 2019-03-12 DIAGNOSIS — I69354 Hemiplegia and hemiparesis following cerebral infarction affecting left non-dominant side: Secondary | ICD-10-CM | POA: Diagnosis not present

## 2019-03-12 DIAGNOSIS — L89312 Pressure ulcer of right buttock, stage 2: Secondary | ICD-10-CM | POA: Diagnosis not present

## 2019-03-12 DIAGNOSIS — L89322 Pressure ulcer of left buttock, stage 2: Secondary | ICD-10-CM | POA: Diagnosis not present

## 2019-03-12 DIAGNOSIS — I38 Endocarditis, valve unspecified: Secondary | ICD-10-CM | POA: Diagnosis not present

## 2019-03-22 DIAGNOSIS — F329 Major depressive disorder, single episode, unspecified: Secondary | ICD-10-CM | POA: Diagnosis not present

## 2019-03-22 DIAGNOSIS — Z431 Encounter for attention to gastrostomy: Secondary | ICD-10-CM | POA: Diagnosis not present

## 2019-03-22 DIAGNOSIS — I38 Endocarditis, valve unspecified: Secondary | ICD-10-CM | POA: Diagnosis not present

## 2019-03-22 DIAGNOSIS — F419 Anxiety disorder, unspecified: Secondary | ICD-10-CM | POA: Diagnosis not present

## 2019-03-22 DIAGNOSIS — R1312 Dysphagia, oropharyngeal phase: Secondary | ICD-10-CM | POA: Diagnosis not present

## 2019-03-22 DIAGNOSIS — D649 Anemia, unspecified: Secondary | ICD-10-CM | POA: Diagnosis not present

## 2019-03-22 DIAGNOSIS — Z87440 Personal history of urinary (tract) infections: Secondary | ICD-10-CM | POA: Diagnosis not present

## 2019-03-22 DIAGNOSIS — I352 Nonrheumatic aortic (valve) stenosis with insufficiency: Secondary | ICD-10-CM | POA: Diagnosis not present

## 2019-03-22 DIAGNOSIS — L89322 Pressure ulcer of left buttock, stage 2: Secondary | ICD-10-CM | POA: Diagnosis not present

## 2019-03-22 DIAGNOSIS — Z9181 History of falling: Secondary | ICD-10-CM | POA: Diagnosis not present

## 2019-03-22 DIAGNOSIS — I69391 Dysphagia following cerebral infarction: Secondary | ICD-10-CM | POA: Diagnosis not present

## 2019-03-22 DIAGNOSIS — I4891 Unspecified atrial fibrillation: Secondary | ICD-10-CM | POA: Diagnosis not present

## 2019-03-22 DIAGNOSIS — R32 Unspecified urinary incontinence: Secondary | ICD-10-CM | POA: Diagnosis not present

## 2019-03-22 DIAGNOSIS — M150 Primary generalized (osteo)arthritis: Secondary | ICD-10-CM | POA: Diagnosis not present

## 2019-03-22 DIAGNOSIS — E039 Hypothyroidism, unspecified: Secondary | ICD-10-CM | POA: Diagnosis not present

## 2019-03-22 DIAGNOSIS — Z90710 Acquired absence of both cervix and uterus: Secondary | ICD-10-CM | POA: Diagnosis not present

## 2019-03-22 DIAGNOSIS — I10 Essential (primary) hypertension: Secondary | ICD-10-CM | POA: Diagnosis not present

## 2019-03-22 DIAGNOSIS — E785 Hyperlipidemia, unspecified: Secondary | ICD-10-CM | POA: Diagnosis not present

## 2019-03-22 DIAGNOSIS — Z7901 Long term (current) use of anticoagulants: Secondary | ICD-10-CM | POA: Diagnosis not present

## 2019-03-22 DIAGNOSIS — E559 Vitamin D deficiency, unspecified: Secondary | ICD-10-CM | POA: Diagnosis not present

## 2019-03-22 DIAGNOSIS — K219 Gastro-esophageal reflux disease without esophagitis: Secondary | ICD-10-CM | POA: Diagnosis not present

## 2019-03-22 DIAGNOSIS — M810 Age-related osteoporosis without current pathological fracture: Secondary | ICD-10-CM | POA: Diagnosis not present

## 2019-03-22 DIAGNOSIS — Z8701 Personal history of pneumonia (recurrent): Secondary | ICD-10-CM | POA: Diagnosis not present

## 2019-03-22 DIAGNOSIS — L89312 Pressure ulcer of right buttock, stage 2: Secondary | ICD-10-CM | POA: Diagnosis not present

## 2019-03-22 DIAGNOSIS — I69354 Hemiplegia and hemiparesis following cerebral infarction affecting left non-dominant side: Secondary | ICD-10-CM | POA: Diagnosis not present

## 2019-03-26 DIAGNOSIS — I69391 Dysphagia following cerebral infarction: Secondary | ICD-10-CM | POA: Diagnosis not present

## 2019-03-26 DIAGNOSIS — I69354 Hemiplegia and hemiparesis following cerebral infarction affecting left non-dominant side: Secondary | ICD-10-CM | POA: Diagnosis not present

## 2019-03-26 DIAGNOSIS — L89312 Pressure ulcer of right buttock, stage 2: Secondary | ICD-10-CM | POA: Diagnosis not present

## 2019-03-26 DIAGNOSIS — R1312 Dysphagia, oropharyngeal phase: Secondary | ICD-10-CM | POA: Diagnosis not present

## 2019-03-26 DIAGNOSIS — L89322 Pressure ulcer of left buttock, stage 2: Secondary | ICD-10-CM | POA: Diagnosis not present

## 2019-03-26 DIAGNOSIS — I38 Endocarditis, valve unspecified: Secondary | ICD-10-CM | POA: Diagnosis not present

## 2019-03-31 DIAGNOSIS — L89322 Pressure ulcer of left buttock, stage 2: Secondary | ICD-10-CM | POA: Diagnosis not present

## 2019-03-31 DIAGNOSIS — I38 Endocarditis, valve unspecified: Secondary | ICD-10-CM | POA: Diagnosis not present

## 2019-03-31 DIAGNOSIS — L89312 Pressure ulcer of right buttock, stage 2: Secondary | ICD-10-CM | POA: Diagnosis not present

## 2019-03-31 DIAGNOSIS — I69391 Dysphagia following cerebral infarction: Secondary | ICD-10-CM | POA: Diagnosis not present

## 2019-03-31 DIAGNOSIS — R1312 Dysphagia, oropharyngeal phase: Secondary | ICD-10-CM | POA: Diagnosis not present

## 2019-03-31 DIAGNOSIS — I69354 Hemiplegia and hemiparesis following cerebral infarction affecting left non-dominant side: Secondary | ICD-10-CM | POA: Diagnosis not present

## 2019-04-08 DIAGNOSIS — I69354 Hemiplegia and hemiparesis following cerebral infarction affecting left non-dominant side: Secondary | ICD-10-CM | POA: Diagnosis not present

## 2019-04-08 DIAGNOSIS — I69391 Dysphagia following cerebral infarction: Secondary | ICD-10-CM | POA: Diagnosis not present

## 2019-04-08 DIAGNOSIS — I38 Endocarditis, valve unspecified: Secondary | ICD-10-CM | POA: Diagnosis not present

## 2019-04-08 DIAGNOSIS — R1312 Dysphagia, oropharyngeal phase: Secondary | ICD-10-CM | POA: Diagnosis not present

## 2019-04-08 DIAGNOSIS — L89312 Pressure ulcer of right buttock, stage 2: Secondary | ICD-10-CM | POA: Diagnosis not present

## 2019-04-08 DIAGNOSIS — L89322 Pressure ulcer of left buttock, stage 2: Secondary | ICD-10-CM | POA: Diagnosis not present

## 2019-04-10 ENCOUNTER — Other Ambulatory Visit: Payer: Self-pay

## 2019-04-15 DIAGNOSIS — R1312 Dysphagia, oropharyngeal phase: Secondary | ICD-10-CM | POA: Diagnosis not present

## 2019-04-15 DIAGNOSIS — I69391 Dysphagia following cerebral infarction: Secondary | ICD-10-CM | POA: Diagnosis not present

## 2019-04-15 DIAGNOSIS — L89322 Pressure ulcer of left buttock, stage 2: Secondary | ICD-10-CM | POA: Diagnosis not present

## 2019-04-15 DIAGNOSIS — L89312 Pressure ulcer of right buttock, stage 2: Secondary | ICD-10-CM | POA: Diagnosis not present

## 2019-04-15 DIAGNOSIS — I69354 Hemiplegia and hemiparesis following cerebral infarction affecting left non-dominant side: Secondary | ICD-10-CM | POA: Diagnosis not present

## 2019-04-15 DIAGNOSIS — I38 Endocarditis, valve unspecified: Secondary | ICD-10-CM | POA: Diagnosis not present

## 2019-05-15 DIAGNOSIS — L8990 Pressure ulcer of unspecified site, unspecified stage: Secondary | ICD-10-CM | POA: Diagnosis not present

## 2019-08-04 ENCOUNTER — Other Ambulatory Visit: Payer: Self-pay

## 2019-12-29 DIAGNOSIS — N390 Urinary tract infection, site not specified: Secondary | ICD-10-CM | POA: Diagnosis not present

## 2020-04-14 DIAGNOSIS — N390 Urinary tract infection, site not specified: Secondary | ICD-10-CM | POA: Diagnosis not present

## 2020-06-02 DIAGNOSIS — H524 Presbyopia: Secondary | ICD-10-CM | POA: Diagnosis not present

## 2020-06-02 DIAGNOSIS — H5213 Myopia, bilateral: Secondary | ICD-10-CM | POA: Diagnosis not present

## 2020-06-02 DIAGNOSIS — H52223 Regular astigmatism, bilateral: Secondary | ICD-10-CM | POA: Diagnosis not present

## 2020-06-02 DIAGNOSIS — Z23 Encounter for immunization: Secondary | ICD-10-CM | POA: Diagnosis not present

## 2020-06-02 DIAGNOSIS — H25813 Combined forms of age-related cataract, bilateral: Secondary | ICD-10-CM | POA: Diagnosis not present

## 2020-06-03 DIAGNOSIS — N390 Urinary tract infection, site not specified: Secondary | ICD-10-CM | POA: Diagnosis not present

## 2020-07-01 DIAGNOSIS — N390 Urinary tract infection, site not specified: Secondary | ICD-10-CM | POA: Diagnosis not present

## 2020-07-07 DIAGNOSIS — R339 Retention of urine, unspecified: Secondary | ICD-10-CM | POA: Diagnosis not present

## 2020-07-07 DIAGNOSIS — N319 Neuromuscular dysfunction of bladder, unspecified: Secondary | ICD-10-CM | POA: Diagnosis not present

## 2020-07-07 DIAGNOSIS — Z978 Presence of other specified devices: Secondary | ICD-10-CM | POA: Diagnosis not present

## 2020-07-07 DIAGNOSIS — Z79899 Other long term (current) drug therapy: Secondary | ICD-10-CM | POA: Diagnosis not present

## 2020-07-07 DIAGNOSIS — I6990 Unspecified sequelae of unspecified cerebrovascular disease: Secondary | ICD-10-CM | POA: Diagnosis not present

## 2020-08-01 DIAGNOSIS — R350 Frequency of micturition: Secondary | ICD-10-CM | POA: Diagnosis not present

## 2020-08-01 DIAGNOSIS — H903 Sensorineural hearing loss, bilateral: Secondary | ICD-10-CM | POA: Diagnosis not present

## 2020-08-01 DIAGNOSIS — I10 Essential (primary) hypertension: Secondary | ICD-10-CM | POA: Diagnosis not present

## 2020-08-01 DIAGNOSIS — D649 Anemia, unspecified: Secondary | ICD-10-CM | POA: Diagnosis not present

## 2020-08-01 DIAGNOSIS — R351 Nocturia: Secondary | ICD-10-CM | POA: Diagnosis not present

## 2020-08-01 DIAGNOSIS — R339 Retention of urine, unspecified: Secondary | ICD-10-CM | POA: Diagnosis not present

## 2020-08-01 DIAGNOSIS — N319 Neuromuscular dysfunction of bladder, unspecified: Secondary | ICD-10-CM | POA: Diagnosis not present

## 2020-08-01 DIAGNOSIS — I729 Aneurysm of unspecified site: Secondary | ICD-10-CM | POA: Diagnosis not present

## 2020-08-01 DIAGNOSIS — Z7401 Bed confinement status: Secondary | ICD-10-CM | POA: Diagnosis not present

## 2020-08-01 DIAGNOSIS — Z9181 History of falling: Secondary | ICD-10-CM | POA: Diagnosis not present

## 2020-08-01 DIAGNOSIS — K5900 Constipation, unspecified: Secondary | ICD-10-CM | POA: Diagnosis not present

## 2020-08-01 DIAGNOSIS — E669 Obesity, unspecified: Secondary | ICD-10-CM | POA: Diagnosis not present

## 2020-08-01 DIAGNOSIS — R32 Unspecified urinary incontinence: Secondary | ICD-10-CM | POA: Diagnosis not present

## 2020-08-01 DIAGNOSIS — Z87440 Personal history of urinary (tract) infections: Secondary | ICD-10-CM | POA: Diagnosis not present

## 2020-08-01 DIAGNOSIS — Z6831 Body mass index (BMI) 31.0-31.9, adult: Secondary | ICD-10-CM | POA: Diagnosis not present

## 2020-08-01 DIAGNOSIS — I69359 Hemiplegia and hemiparesis following cerebral infarction affecting unspecified side: Secondary | ICD-10-CM | POA: Diagnosis not present

## 2020-08-01 DIAGNOSIS — Z466 Encounter for fitting and adjustment of urinary device: Secondary | ICD-10-CM | POA: Diagnosis not present

## 2020-08-01 DIAGNOSIS — R011 Cardiac murmur, unspecified: Secondary | ICD-10-CM | POA: Diagnosis not present

## 2020-08-01 DIAGNOSIS — Z978 Presence of other specified devices: Secondary | ICD-10-CM | POA: Diagnosis not present

## 2020-08-01 DIAGNOSIS — N390 Urinary tract infection, site not specified: Secondary | ICD-10-CM | POA: Diagnosis not present

## 2020-08-01 DIAGNOSIS — I69398 Other sequelae of cerebral infarction: Secondary | ICD-10-CM | POA: Diagnosis not present

## 2020-08-16 DIAGNOSIS — N390 Urinary tract infection, site not specified: Secondary | ICD-10-CM | POA: Diagnosis not present

## 2020-08-16 DIAGNOSIS — R339 Retention of urine, unspecified: Secondary | ICD-10-CM | POA: Diagnosis not present

## 2020-08-16 DIAGNOSIS — I10 Essential (primary) hypertension: Secondary | ICD-10-CM | POA: Diagnosis not present

## 2020-08-16 DIAGNOSIS — Z466 Encounter for fitting and adjustment of urinary device: Secondary | ICD-10-CM | POA: Diagnosis not present

## 2020-08-16 DIAGNOSIS — I69398 Other sequelae of cerebral infarction: Secondary | ICD-10-CM | POA: Diagnosis not present

## 2020-08-16 DIAGNOSIS — N319 Neuromuscular dysfunction of bladder, unspecified: Secondary | ICD-10-CM | POA: Diagnosis not present

## 2020-08-30 DIAGNOSIS — R339 Retention of urine, unspecified: Secondary | ICD-10-CM | POA: Diagnosis not present

## 2020-08-30 DIAGNOSIS — N390 Urinary tract infection, site not specified: Secondary | ICD-10-CM | POA: Diagnosis not present

## 2020-08-30 DIAGNOSIS — I10 Essential (primary) hypertension: Secondary | ICD-10-CM | POA: Diagnosis not present

## 2020-08-30 DIAGNOSIS — I69398 Other sequelae of cerebral infarction: Secondary | ICD-10-CM | POA: Diagnosis not present

## 2020-08-30 DIAGNOSIS — N319 Neuromuscular dysfunction of bladder, unspecified: Secondary | ICD-10-CM | POA: Diagnosis not present

## 2020-08-30 DIAGNOSIS — Z466 Encounter for fitting and adjustment of urinary device: Secondary | ICD-10-CM | POA: Diagnosis not present

## 2020-08-31 DIAGNOSIS — Z466 Encounter for fitting and adjustment of urinary device: Secondary | ICD-10-CM | POA: Diagnosis not present

## 2020-08-31 DIAGNOSIS — R011 Cardiac murmur, unspecified: Secondary | ICD-10-CM | POA: Diagnosis not present

## 2020-08-31 DIAGNOSIS — Z6831 Body mass index (BMI) 31.0-31.9, adult: Secondary | ICD-10-CM | POA: Diagnosis not present

## 2020-08-31 DIAGNOSIS — N319 Neuromuscular dysfunction of bladder, unspecified: Secondary | ICD-10-CM | POA: Diagnosis not present

## 2020-08-31 DIAGNOSIS — Z9181 History of falling: Secondary | ICD-10-CM | POA: Diagnosis not present

## 2020-08-31 DIAGNOSIS — D649 Anemia, unspecified: Secondary | ICD-10-CM | POA: Diagnosis not present

## 2020-08-31 DIAGNOSIS — I10 Essential (primary) hypertension: Secondary | ICD-10-CM | POA: Diagnosis not present

## 2020-08-31 DIAGNOSIS — I69359 Hemiplegia and hemiparesis following cerebral infarction affecting unspecified side: Secondary | ICD-10-CM | POA: Diagnosis not present

## 2020-08-31 DIAGNOSIS — R351 Nocturia: Secondary | ICD-10-CM | POA: Diagnosis not present

## 2020-08-31 DIAGNOSIS — Z87440 Personal history of urinary (tract) infections: Secondary | ICD-10-CM | POA: Diagnosis not present

## 2020-08-31 DIAGNOSIS — Z7401 Bed confinement status: Secondary | ICD-10-CM | POA: Diagnosis not present

## 2020-08-31 DIAGNOSIS — E669 Obesity, unspecified: Secondary | ICD-10-CM | POA: Diagnosis not present

## 2020-08-31 DIAGNOSIS — R350 Frequency of micturition: Secondary | ICD-10-CM | POA: Diagnosis not present

## 2020-08-31 DIAGNOSIS — R339 Retention of urine, unspecified: Secondary | ICD-10-CM | POA: Diagnosis not present

## 2020-08-31 DIAGNOSIS — N390 Urinary tract infection, site not specified: Secondary | ICD-10-CM | POA: Diagnosis not present

## 2020-08-31 DIAGNOSIS — I69398 Other sequelae of cerebral infarction: Secondary | ICD-10-CM | POA: Diagnosis not present

## 2020-08-31 DIAGNOSIS — K5900 Constipation, unspecified: Secondary | ICD-10-CM | POA: Diagnosis not present

## 2020-08-31 DIAGNOSIS — H903 Sensorineural hearing loss, bilateral: Secondary | ICD-10-CM | POA: Diagnosis not present

## 2020-08-31 DIAGNOSIS — R32 Unspecified urinary incontinence: Secondary | ICD-10-CM | POA: Diagnosis not present

## 2020-08-31 DIAGNOSIS — I729 Aneurysm of unspecified site: Secondary | ICD-10-CM | POA: Diagnosis not present

## 2020-09-13 DIAGNOSIS — I10 Essential (primary) hypertension: Secondary | ICD-10-CM | POA: Diagnosis not present

## 2020-09-13 DIAGNOSIS — Z466 Encounter for fitting and adjustment of urinary device: Secondary | ICD-10-CM | POA: Diagnosis not present

## 2020-09-13 DIAGNOSIS — N319 Neuromuscular dysfunction of bladder, unspecified: Secondary | ICD-10-CM | POA: Diagnosis not present

## 2020-09-13 DIAGNOSIS — I69398 Other sequelae of cerebral infarction: Secondary | ICD-10-CM | POA: Diagnosis not present

## 2020-09-13 DIAGNOSIS — N390 Urinary tract infection, site not specified: Secondary | ICD-10-CM | POA: Diagnosis not present

## 2020-09-13 DIAGNOSIS — R339 Retention of urine, unspecified: Secondary | ICD-10-CM | POA: Diagnosis not present

## 2020-09-29 DIAGNOSIS — N319 Neuromuscular dysfunction of bladder, unspecified: Secondary | ICD-10-CM | POA: Diagnosis not present

## 2020-09-29 DIAGNOSIS — R339 Retention of urine, unspecified: Secondary | ICD-10-CM | POA: Diagnosis not present

## 2020-09-29 DIAGNOSIS — N390 Urinary tract infection, site not specified: Secondary | ICD-10-CM | POA: Diagnosis not present

## 2020-09-29 DIAGNOSIS — I10 Essential (primary) hypertension: Secondary | ICD-10-CM | POA: Diagnosis not present

## 2020-09-29 DIAGNOSIS — I69398 Other sequelae of cerebral infarction: Secondary | ICD-10-CM | POA: Diagnosis not present

## 2020-09-29 DIAGNOSIS — Z466 Encounter for fitting and adjustment of urinary device: Secondary | ICD-10-CM | POA: Diagnosis not present

## 2020-09-30 DIAGNOSIS — R339 Retention of urine, unspecified: Secondary | ICD-10-CM | POA: Diagnosis not present

## 2020-09-30 DIAGNOSIS — N390 Urinary tract infection, site not specified: Secondary | ICD-10-CM | POA: Diagnosis not present

## 2020-09-30 DIAGNOSIS — Z7401 Bed confinement status: Secondary | ICD-10-CM | POA: Diagnosis not present

## 2020-09-30 DIAGNOSIS — D649 Anemia, unspecified: Secondary | ICD-10-CM | POA: Diagnosis not present

## 2020-09-30 DIAGNOSIS — R011 Cardiac murmur, unspecified: Secondary | ICD-10-CM | POA: Diagnosis not present

## 2020-09-30 DIAGNOSIS — I729 Aneurysm of unspecified site: Secondary | ICD-10-CM | POA: Diagnosis not present

## 2020-09-30 DIAGNOSIS — Z87440 Personal history of urinary (tract) infections: Secondary | ICD-10-CM | POA: Diagnosis not present

## 2020-09-30 DIAGNOSIS — I69359 Hemiplegia and hemiparesis following cerebral infarction affecting unspecified side: Secondary | ICD-10-CM | POA: Diagnosis not present

## 2020-09-30 DIAGNOSIS — Z466 Encounter for fitting and adjustment of urinary device: Secondary | ICD-10-CM | POA: Diagnosis not present

## 2020-09-30 DIAGNOSIS — E669 Obesity, unspecified: Secondary | ICD-10-CM | POA: Diagnosis not present

## 2020-09-30 DIAGNOSIS — R32 Unspecified urinary incontinence: Secondary | ICD-10-CM | POA: Diagnosis not present

## 2020-09-30 DIAGNOSIS — K5900 Constipation, unspecified: Secondary | ICD-10-CM | POA: Diagnosis not present

## 2020-09-30 DIAGNOSIS — Z9181 History of falling: Secondary | ICD-10-CM | POA: Diagnosis not present

## 2020-09-30 DIAGNOSIS — R351 Nocturia: Secondary | ICD-10-CM | POA: Diagnosis not present

## 2020-09-30 DIAGNOSIS — H903 Sensorineural hearing loss, bilateral: Secondary | ICD-10-CM | POA: Diagnosis not present

## 2020-09-30 DIAGNOSIS — I10 Essential (primary) hypertension: Secondary | ICD-10-CM | POA: Diagnosis not present

## 2020-09-30 DIAGNOSIS — I69398 Other sequelae of cerebral infarction: Secondary | ICD-10-CM | POA: Diagnosis not present

## 2020-09-30 DIAGNOSIS — R350 Frequency of micturition: Secondary | ICD-10-CM | POA: Diagnosis not present

## 2020-09-30 DIAGNOSIS — N319 Neuromuscular dysfunction of bladder, unspecified: Secondary | ICD-10-CM | POA: Diagnosis not present

## 2020-09-30 DIAGNOSIS — Z6831 Body mass index (BMI) 31.0-31.9, adult: Secondary | ICD-10-CM | POA: Diagnosis not present

## 2020-10-12 DIAGNOSIS — I69398 Other sequelae of cerebral infarction: Secondary | ICD-10-CM | POA: Diagnosis not present

## 2020-10-12 DIAGNOSIS — N319 Neuromuscular dysfunction of bladder, unspecified: Secondary | ICD-10-CM | POA: Diagnosis not present

## 2020-10-12 DIAGNOSIS — N390 Urinary tract infection, site not specified: Secondary | ICD-10-CM | POA: Diagnosis not present

## 2020-10-12 DIAGNOSIS — Z466 Encounter for fitting and adjustment of urinary device: Secondary | ICD-10-CM | POA: Diagnosis not present

## 2020-10-12 DIAGNOSIS — I10 Essential (primary) hypertension: Secondary | ICD-10-CM | POA: Diagnosis not present

## 2020-10-12 DIAGNOSIS — R339 Retention of urine, unspecified: Secondary | ICD-10-CM | POA: Diagnosis not present

## 2020-10-20 DIAGNOSIS — R339 Retention of urine, unspecified: Secondary | ICD-10-CM | POA: Diagnosis not present

## 2020-10-20 DIAGNOSIS — N390 Urinary tract infection, site not specified: Secondary | ICD-10-CM | POA: Diagnosis not present

## 2020-10-20 DIAGNOSIS — N319 Neuromuscular dysfunction of bladder, unspecified: Secondary | ICD-10-CM | POA: Diagnosis not present

## 2020-10-20 DIAGNOSIS — Z466 Encounter for fitting and adjustment of urinary device: Secondary | ICD-10-CM | POA: Diagnosis not present

## 2020-10-20 DIAGNOSIS — I69398 Other sequelae of cerebral infarction: Secondary | ICD-10-CM | POA: Diagnosis not present

## 2020-10-20 DIAGNOSIS — I10 Essential (primary) hypertension: Secondary | ICD-10-CM | POA: Diagnosis not present

## 2020-10-30 DIAGNOSIS — R339 Retention of urine, unspecified: Secondary | ICD-10-CM | POA: Diagnosis not present

## 2020-10-30 DIAGNOSIS — Z466 Encounter for fitting and adjustment of urinary device: Secondary | ICD-10-CM | POA: Diagnosis not present

## 2020-10-30 DIAGNOSIS — Z7401 Bed confinement status: Secondary | ICD-10-CM | POA: Diagnosis not present

## 2020-10-30 DIAGNOSIS — R011 Cardiac murmur, unspecified: Secondary | ICD-10-CM | POA: Diagnosis not present

## 2020-10-30 DIAGNOSIS — N319 Neuromuscular dysfunction of bladder, unspecified: Secondary | ICD-10-CM | POA: Diagnosis not present

## 2020-10-30 DIAGNOSIS — R32 Unspecified urinary incontinence: Secondary | ICD-10-CM | POA: Diagnosis not present

## 2020-10-30 DIAGNOSIS — I729 Aneurysm of unspecified site: Secondary | ICD-10-CM | POA: Diagnosis not present

## 2020-10-30 DIAGNOSIS — Z9181 History of falling: Secondary | ICD-10-CM | POA: Diagnosis not present

## 2020-10-30 DIAGNOSIS — D649 Anemia, unspecified: Secondary | ICD-10-CM | POA: Diagnosis not present

## 2020-10-30 DIAGNOSIS — H903 Sensorineural hearing loss, bilateral: Secondary | ICD-10-CM | POA: Diagnosis not present

## 2020-10-30 DIAGNOSIS — I69398 Other sequelae of cerebral infarction: Secondary | ICD-10-CM | POA: Diagnosis not present

## 2020-10-30 DIAGNOSIS — R351 Nocturia: Secondary | ICD-10-CM | POA: Diagnosis not present

## 2020-10-30 DIAGNOSIS — R350 Frequency of micturition: Secondary | ICD-10-CM | POA: Diagnosis not present

## 2020-10-30 DIAGNOSIS — I69359 Hemiplegia and hemiparesis following cerebral infarction affecting unspecified side: Secondary | ICD-10-CM | POA: Diagnosis not present

## 2020-10-30 DIAGNOSIS — E669 Obesity, unspecified: Secondary | ICD-10-CM | POA: Diagnosis not present

## 2020-10-30 DIAGNOSIS — K5900 Constipation, unspecified: Secondary | ICD-10-CM | POA: Diagnosis not present

## 2020-10-30 DIAGNOSIS — Z6831 Body mass index (BMI) 31.0-31.9, adult: Secondary | ICD-10-CM | POA: Diagnosis not present

## 2020-10-30 DIAGNOSIS — I10 Essential (primary) hypertension: Secondary | ICD-10-CM | POA: Diagnosis not present

## 2020-10-30 DIAGNOSIS — N390 Urinary tract infection, site not specified: Secondary | ICD-10-CM | POA: Diagnosis not present

## 2020-10-30 DIAGNOSIS — Z87440 Personal history of urinary (tract) infections: Secondary | ICD-10-CM | POA: Diagnosis not present

## 2020-10-31 DIAGNOSIS — I10 Essential (primary) hypertension: Secondary | ICD-10-CM | POA: Diagnosis not present

## 2020-10-31 DIAGNOSIS — N390 Urinary tract infection, site not specified: Secondary | ICD-10-CM | POA: Diagnosis not present

## 2020-10-31 DIAGNOSIS — R339 Retention of urine, unspecified: Secondary | ICD-10-CM | POA: Diagnosis not present

## 2020-10-31 DIAGNOSIS — Z466 Encounter for fitting and adjustment of urinary device: Secondary | ICD-10-CM | POA: Diagnosis not present

## 2020-10-31 DIAGNOSIS — N319 Neuromuscular dysfunction of bladder, unspecified: Secondary | ICD-10-CM | POA: Diagnosis not present

## 2020-10-31 DIAGNOSIS — I69398 Other sequelae of cerebral infarction: Secondary | ICD-10-CM | POA: Diagnosis not present

## 2020-11-03 ENCOUNTER — Inpatient Hospital Stay (HOSPITAL_COMMUNITY)
Admission: EM | Admit: 2020-11-03 | Discharge: 2020-11-12 | DRG: 640 | Disposition: A | Discharge status: Home Health | Payer: Medicare Other | Attending: Internal Medicine | Admitting: Internal Medicine

## 2020-11-03 ENCOUNTER — Inpatient Hospital Stay (HOSPITAL_COMMUNITY): Payer: Medicare Other

## 2020-11-03 ENCOUNTER — Emergency Department (HOSPITAL_COMMUNITY): Payer: Medicare Other

## 2020-11-03 DIAGNOSIS — T464X5A Adverse effect of angiotensin-converting-enzyme inhibitors, initial encounter: Secondary | ICD-10-CM | POA: Diagnosis present

## 2020-11-03 DIAGNOSIS — Y929 Unspecified place or not applicable: Secondary | ICD-10-CM

## 2020-11-03 DIAGNOSIS — E039 Hypothyroidism, unspecified: Secondary | ICD-10-CM | POA: Diagnosis present

## 2020-11-03 DIAGNOSIS — R011 Cardiac murmur, unspecified: Secondary | ICD-10-CM | POA: Diagnosis present

## 2020-11-03 DIAGNOSIS — R059 Cough, unspecified: Secondary | ICD-10-CM | POA: Diagnosis not present

## 2020-11-03 DIAGNOSIS — E871 Hypo-osmolality and hyponatremia: Secondary | ICD-10-CM | POA: Diagnosis present

## 2020-11-03 DIAGNOSIS — Z79890 Hormone replacement therapy (postmenopausal): Secondary | ICD-10-CM | POA: Diagnosis not present

## 2020-11-03 DIAGNOSIS — I63511 Cerebral infarction due to unspecified occlusion or stenosis of right middle cerebral artery: Secondary | ICD-10-CM | POA: Diagnosis not present

## 2020-11-03 DIAGNOSIS — E7800 Pure hypercholesterolemia, unspecified: Secondary | ICD-10-CM | POA: Diagnosis present

## 2020-11-03 DIAGNOSIS — Z431 Encounter for attention to gastrostomy: Secondary | ICD-10-CM | POA: Diagnosis not present

## 2020-11-03 DIAGNOSIS — R4182 Altered mental status, unspecified: Secondary | ICD-10-CM | POA: Diagnosis not present

## 2020-11-03 DIAGNOSIS — R609 Edema, unspecified: Secondary | ICD-10-CM | POA: Diagnosis present

## 2020-11-03 DIAGNOSIS — I9589 Other hypotension: Secondary | ICD-10-CM | POA: Diagnosis not present

## 2020-11-03 DIAGNOSIS — Z6828 Body mass index (BMI) 28.0-28.9, adult: Secondary | ICD-10-CM | POA: Diagnosis not present

## 2020-11-03 DIAGNOSIS — E872 Acidosis: Secondary | ICD-10-CM | POA: Diagnosis present

## 2020-11-03 DIAGNOSIS — Z66 Do not resuscitate: Secondary | ICD-10-CM | POA: Diagnosis not present

## 2020-11-03 DIAGNOSIS — Z20822 Contact with and (suspected) exposure to covid-19: Secondary | ICD-10-CM | POA: Diagnosis present

## 2020-11-03 DIAGNOSIS — Z743 Need for continuous supervision: Secondary | ICD-10-CM | POA: Diagnosis not present

## 2020-11-03 DIAGNOSIS — F0150 Vascular dementia without behavioral disturbance: Secondary | ICD-10-CM | POA: Diagnosis present

## 2020-11-03 DIAGNOSIS — E860 Dehydration: Secondary | ICD-10-CM | POA: Diagnosis present

## 2020-11-03 DIAGNOSIS — K9423 Gastrostomy malfunction: Secondary | ICD-10-CM | POA: Diagnosis not present

## 2020-11-03 DIAGNOSIS — R627 Adult failure to thrive: Secondary | ICD-10-CM | POA: Diagnosis present

## 2020-11-03 DIAGNOSIS — Z8249 Family history of ischemic heart disease and other diseases of the circulatory system: Secondary | ICD-10-CM

## 2020-11-03 DIAGNOSIS — E8809 Other disorders of plasma-protein metabolism, not elsewhere classified: Secondary | ICD-10-CM | POA: Diagnosis present

## 2020-11-03 DIAGNOSIS — L8990 Pressure ulcer of unspecified site, unspecified stage: Secondary | ICD-10-CM | POA: Insufficient documentation

## 2020-11-03 DIAGNOSIS — R1111 Vomiting without nausea: Secondary | ICD-10-CM | POA: Diagnosis not present

## 2020-11-03 DIAGNOSIS — G9341 Metabolic encephalopathy: Secondary | ICD-10-CM | POA: Diagnosis present

## 2020-11-03 DIAGNOSIS — E875 Hyperkalemia: Secondary | ICD-10-CM | POA: Diagnosis present

## 2020-11-03 DIAGNOSIS — R54 Age-related physical debility: Secondary | ICD-10-CM | POA: Diagnosis present

## 2020-11-03 DIAGNOSIS — R0902 Hypoxemia: Secondary | ICD-10-CM | POA: Diagnosis present

## 2020-11-03 DIAGNOSIS — Z79899 Other long term (current) drug therapy: Secondary | ICD-10-CM

## 2020-11-03 DIAGNOSIS — I10 Essential (primary) hypertension: Secondary | ICD-10-CM | POA: Diagnosis present

## 2020-11-03 DIAGNOSIS — B965 Pseudomonas (aeruginosa) (mallei) (pseudomallei) as the cause of diseases classified elsewhere: Secondary | ICD-10-CM | POA: Diagnosis present

## 2020-11-03 DIAGNOSIS — Z7401 Bed confinement status: Secondary | ICD-10-CM | POA: Diagnosis not present

## 2020-11-03 DIAGNOSIS — I69354 Hemiplegia and hemiparesis following cerebral infarction affecting left non-dominant side: Secondary | ICD-10-CM

## 2020-11-03 DIAGNOSIS — A419 Sepsis, unspecified organism: Secondary | ICD-10-CM | POA: Diagnosis not present

## 2020-11-03 DIAGNOSIS — Z7901 Long term (current) use of anticoagulants: Secondary | ICD-10-CM

## 2020-11-03 DIAGNOSIS — Z515 Encounter for palliative care: Secondary | ICD-10-CM | POA: Diagnosis not present

## 2020-11-03 DIAGNOSIS — E43 Unspecified severe protein-calorie malnutrition: Secondary | ICD-10-CM | POA: Diagnosis present

## 2020-11-03 DIAGNOSIS — N390 Urinary tract infection, site not specified: Secondary | ICD-10-CM | POA: Diagnosis present

## 2020-11-03 DIAGNOSIS — R4702 Dysphasia: Secondary | ICD-10-CM | POA: Diagnosis present

## 2020-11-03 DIAGNOSIS — Z96653 Presence of artificial knee joint, bilateral: Secondary | ICD-10-CM | POA: Diagnosis present

## 2020-11-03 DIAGNOSIS — L89312 Pressure ulcer of right buttock, stage 2: Secondary | ICD-10-CM | POA: Diagnosis present

## 2020-11-03 DIAGNOSIS — Z7189 Other specified counseling: Secondary | ICD-10-CM | POA: Diagnosis not present

## 2020-11-03 DIAGNOSIS — F0280 Dementia in other diseases classified elsewhere without behavioral disturbance: Secondary | ICD-10-CM | POA: Diagnosis present

## 2020-11-03 DIAGNOSIS — I959 Hypotension, unspecified: Secondary | ICD-10-CM | POA: Diagnosis not present

## 2020-11-03 DIAGNOSIS — D638 Anemia in other chronic diseases classified elsewhere: Secondary | ICD-10-CM | POA: Diagnosis present

## 2020-11-03 DIAGNOSIS — R0602 Shortness of breath: Secondary | ICD-10-CM | POA: Diagnosis not present

## 2020-11-03 DIAGNOSIS — E861 Hypovolemia: Secondary | ICD-10-CM | POA: Diagnosis present

## 2020-11-03 DIAGNOSIS — R1312 Dysphagia, oropharyngeal phase: Secondary | ICD-10-CM | POA: Diagnosis present

## 2020-11-03 DIAGNOSIS — M2550 Pain in unspecified joint: Secondary | ICD-10-CM | POA: Diagnosis not present

## 2020-11-03 DIAGNOSIS — R6889 Other general symptoms and signs: Secondary | ICD-10-CM | POA: Diagnosis not present

## 2020-11-03 DIAGNOSIS — Z87440 Personal history of urinary (tract) infections: Secondary | ICD-10-CM

## 2020-11-03 DIAGNOSIS — I6381 Other cerebral infarction due to occlusion or stenosis of small artery: Secondary | ICD-10-CM | POA: Diagnosis not present

## 2020-11-03 DIAGNOSIS — R531 Weakness: Secondary | ICD-10-CM | POA: Diagnosis not present

## 2020-11-03 DIAGNOSIS — L89322 Pressure ulcer of left buttock, stage 2: Secondary | ICD-10-CM | POA: Diagnosis present

## 2020-11-03 DIAGNOSIS — Z7902 Long term (current) use of antithrombotics/antiplatelets: Secondary | ICD-10-CM

## 2020-11-03 LAB — GLUCOSE, CAPILLARY: Glucose-Capillary: 107 mg/dL — ABNORMAL HIGH (ref 70–99)

## 2020-11-03 LAB — BASIC METABOLIC PANEL
Anion gap: 8 (ref 5–15)
BUN: 26 mg/dL — ABNORMAL HIGH (ref 8–23)
CO2: 19 mmol/L — ABNORMAL LOW (ref 22–32)
Calcium: 8.9 mg/dL (ref 8.9–10.3)
Chloride: 86 mmol/L — ABNORMAL LOW (ref 98–111)
Creatinine, Ser: 0.59 mg/dL (ref 0.44–1.00)
GFR, Estimated: >60 mL/min (ref >60)
Glucose, Bld: 109 mg/dL — ABNORMAL HIGH (ref 70–99)
Potassium: 5.3 mmol/L — ABNORMAL HIGH (ref 3.5–5.1)
Sodium: 113 mmol/L — CL (ref 135–145)

## 2020-11-03 LAB — URINALYSIS, ROUTINE W REFLEX MICROSCOPIC
Bilirubin Urine: NEGATIVE
Glucose, UA: NEGATIVE mg/dL
Ketones, ur: NEGATIVE mg/dL
Nitrite: NEGATIVE
Protein, ur: NEGATIVE mg/dL
Specific Gravity, Urine: 1.006 (ref 1.005–1.030)
pH: 6 (ref 5.0–8.0)

## 2020-11-03 LAB — CBC WITH DIFFERENTIAL/PLATELET
Abs Immature Granulocytes: 0.04 10*3/uL (ref 0.00–0.07)
Basophils Absolute: 0 10*3/uL (ref 0.0–0.1)
Basophils Relative: 0 %
Eosinophils Absolute: 0 10*3/uL (ref 0.0–0.5)
Eosinophils Relative: 1 %
HCT: 29.9 % — ABNORMAL LOW (ref 36.0–46.0)
Hemoglobin: 10.5 g/dL — ABNORMAL LOW (ref 12.0–15.0)
Immature Granulocytes: 1 %
Lymphocytes Relative: 10 %
Lymphs Abs: 0.5 10*3/uL — ABNORMAL LOW (ref 0.7–4.0)
MCH: 31.1 pg (ref 26.0–34.0)
MCHC: 35.1 g/dL (ref 30.0–36.0)
MCV: 88.5 fL (ref 80.0–100.0)
Monocytes Absolute: 0.3 10*3/uL (ref 0.1–1.0)
Monocytes Relative: 5 %
Neutro Abs: 4.4 10*3/uL (ref 1.7–7.7)
Neutrophils Relative %: 83 %
Platelets: 122 10*3/uL — ABNORMAL LOW (ref 150–400)
RBC: 3.38 MIL/uL — ABNORMAL LOW (ref 3.87–5.11)
RDW: 12.6 % (ref 11.5–15.5)
WBC: 5.3 10*3/uL (ref 4.0–10.5)
nRBC: 0 % (ref 0.0–0.2)

## 2020-11-03 LAB — I-STAT CHEM 8, ED
BUN: 26 mg/dL — ABNORMAL HIGH (ref 8–23)
Calcium, Ion: 1.21 mmol/L (ref 1.15–1.40)
Chloride: 83 mmol/L — ABNORMAL LOW (ref 98–111)
Creatinine, Ser: 0.7 mg/dL (ref 0.44–1.00)
Glucose, Bld: 126 mg/dL — ABNORMAL HIGH (ref 70–99)
HCT: 26 % — ABNORMAL LOW (ref 36.0–46.0)
Hemoglobin: 8.8 g/dL — ABNORMAL LOW (ref 12.0–15.0)
Potassium: 5.6 mmol/L — ABNORMAL HIGH (ref 3.5–5.1)
Sodium: 113 mmol/L — CL (ref 135–145)
TCO2: 22 mmol/L (ref 22–32)

## 2020-11-03 LAB — RENAL FUNCTION PANEL
Albumin: 2.7 g/dL — ABNORMAL LOW (ref 3.5–5.0)
Albumin: 2.9 g/dL — ABNORMAL LOW (ref 3.5–5.0)
Anion gap: 7 (ref 5–15)
Anion gap: 9 (ref 5–15)
BUN: 26 mg/dL — ABNORMAL HIGH (ref 8–23)
BUN: 27 mg/dL — ABNORMAL HIGH (ref 8–23)
CO2: 20 mmol/L — ABNORMAL LOW (ref 22–32)
CO2: 17 mmol/L — ABNORMAL LOW (ref 22–32)
Calcium: 8.6 mg/dL — ABNORMAL LOW (ref 8.9–10.3)
Calcium: 9.1 mg/dL (ref 8.9–10.3)
Chloride: 84 mmol/L — ABNORMAL LOW (ref 98–111)
Chloride: 87 mmol/L — ABNORMAL LOW (ref 98–111)
Creatinine, Ser: 0.58 mg/dL (ref 0.44–1.00)
Creatinine, Ser: 0.54 mg/dL (ref 0.44–1.00)
GFR, Estimated: >60 mL/min (ref >60)
GFR, Estimated: >60 mL/min (ref >60)
Glucose, Bld: 124 mg/dL — ABNORMAL HIGH (ref 70–99)
Glucose, Bld: 116 mg/dL — ABNORMAL HIGH (ref 70–99)
Phosphorus: 3.4 mg/dL (ref 2.5–4.6)
Phosphorus: 3.3 mg/dL (ref 2.5–4.6)
Potassium: 5.6 mmol/L — ABNORMAL HIGH (ref 3.5–5.1)
Potassium: 5.8 mmol/L — ABNORMAL HIGH (ref 3.5–5.1)
Sodium: 111 mmol/L — CL (ref 135–145)
Sodium: 113 mmol/L — CL (ref 135–145)

## 2020-11-03 LAB — LIPID PANEL
Cholesterol: 146 mg/dL (ref 0–200)
HDL: 26 mg/dL — ABNORMAL LOW (ref >40)
LDL Cholesterol: 103 mg/dL — ABNORMAL HIGH (ref 0–99)
Total CHOL/HDL Ratio: 5.6 RATIO
Triglycerides: 86 mg/dL (ref <150)
VLDL: 17 mg/dL (ref 0–40)

## 2020-11-03 LAB — COMPREHENSIVE METABOLIC PANEL
ALT: 10 U/L (ref 0–44)
AST: 18 U/L (ref 15–41)
Albumin: 3 g/dL — ABNORMAL LOW (ref 3.5–5.0)
Alkaline Phosphatase: 70 U/L (ref 38–126)
Anion gap: 8 (ref 5–15)
BUN: 30 mg/dL — ABNORMAL HIGH (ref 8–23)
CO2: 18 mmol/L — ABNORMAL LOW (ref 22–32)
Calcium: 8.8 mg/dL — ABNORMAL LOW (ref 8.9–10.3)
Chloride: 84 mmol/L — ABNORMAL LOW (ref 98–111)
Creatinine, Ser: 0.62 mg/dL (ref 0.44–1.00)
GFR, Estimated: >60 mL/min (ref >60)
Glucose, Bld: 107 mg/dL — ABNORMAL HIGH (ref 70–99)
Potassium: 5.8 mmol/L — ABNORMAL HIGH (ref 3.5–5.1)
Sodium: 110 mmol/L — CL (ref 135–145)
Total Bilirubin: 1.2 mg/dL (ref 0.3–1.2)
Total Protein: 5.3 g/dL — ABNORMAL LOW (ref 6.5–8.1)

## 2020-11-03 LAB — OSMOLALITY: Osmolality: 244 mOsm/kg — CL (ref 275–295)

## 2020-11-03 LAB — BRAIN NATRIURETIC PEPTIDE: B Natriuretic Peptide: 338.5 pg/mL — ABNORMAL HIGH (ref 0.0–100.0)

## 2020-11-03 LAB — BLOOD GAS, VENOUS
Acid-base deficit: 2.3 mmol/L — ABNORMAL HIGH (ref 0.0–2.0)
Bicarbonate: 21.9 mmol/L (ref 20.0–28.0)
FIO2: 21
O2 Saturation: 81.3 %
Patient temperature: 36.4
pCO2, Ven: 35.9 mmHg — ABNORMAL LOW (ref 44.0–60.0)
pH, Ven: 7.399 (ref 7.250–7.430)
pO2, Ven: 42.5 mmHg (ref 32.0–45.0)

## 2020-11-03 LAB — TSH: TSH: 1.414 u[IU]/mL (ref 0.350–4.500)

## 2020-11-03 LAB — IRON AND TIBC
Iron: 59 ug/dL (ref 28–170)
Saturation Ratios: 23 % (ref 10.4–31.8)
TIBC: 255 ug/dL (ref 250–450)
UIBC: 196 ug/dL

## 2020-11-03 LAB — PROTIME-INR
INR: 1.5 — ABNORMAL HIGH (ref 0.8–1.2)
Prothrombin Time: 17.6 seconds — ABNORMAL HIGH (ref 11.4–15.2)

## 2020-11-03 LAB — LIPASE, BLOOD: Lipase: 40 U/L (ref 11–51)

## 2020-11-03 LAB — FERRITIN: Ferritin: 766 ng/mL — ABNORMAL HIGH (ref 11–307)

## 2020-11-03 LAB — LACTIC ACID, PLASMA: Lactic Acid, Venous: 1 mmol/L (ref 0.5–1.9)

## 2020-11-03 LAB — SODIUM: Sodium: 113 mmol/L — CL (ref 135–145)

## 2020-11-03 LAB — APTT: aPTT: 42 seconds — ABNORMAL HIGH (ref 24–36)

## 2020-11-03 LAB — TROPONIN I (HIGH SENSITIVITY)
Troponin I (High Sensitivity): 8 ng/L (ref <18)
Troponin I (High Sensitivity): 13 ng/L (ref <18)

## 2020-11-03 MED ORDER — BETHANECHOL CHLORIDE 25 MG PO TABS
50.0000 mg | ORAL_TABLET | Freq: Two times a day (BID) | ORAL | Status: DC
  Administered 2020-11-03 – 2020-11-12 (×15): 50 mg
  Filled 2020-11-03 (×6): qty 2
  Filled 2020-11-03: qty 5
  Filled 2020-11-03 (×2): qty 2
  Filled 2020-11-03: qty 5
  Filled 2020-11-03 (×3): qty 2
  Filled 2020-11-03: qty 5
  Filled 2020-11-03: qty 2

## 2020-11-03 MED ORDER — LEVOTHYROXINE SODIUM 50 MCG PO TABS
50.0000 ug | ORAL_TABLET | Freq: Every day | ORAL | Status: DC
  Administered 2020-11-04 – 2020-11-12 (×7): 50 ug
  Filled 2020-11-03: qty 1
  Filled 2020-11-03: qty 2
  Filled 2020-11-03: qty 1
  Filled 2020-11-03: qty 2
  Filled 2020-11-03 (×4): qty 1

## 2020-11-03 MED ORDER — LEVOTHYROXINE SODIUM 50 MCG PO TABS: 50.0000 ug | ORAL_TABLET | Freq: Every day | ORAL | Status: DC

## 2020-11-03 MED ORDER — ONDANSETRON HCL 4 MG/2ML IJ SOLN: 4.0000 mg | Freq: Four times a day (QID) | INTRAMUSCULAR | Status: DC | PRN

## 2020-11-03 MED ORDER — PRAVASTATIN SODIUM 10 MG PO TABS: 20.0000 mg | ORAL_TABLET | Freq: Every day | ORAL | Status: DC

## 2020-11-03 MED ORDER — SODIUM CHLORIDE 3 % IV SOLN
INTRAVENOUS | Status: DC
  Filled 2020-11-03 (×3): qty 500

## 2020-11-03 MED ORDER — FOLIC ACID 1 MG PO TABS
0.5000 mg | ORAL_TABLET | Freq: Every day | ORAL | Status: DC
  Administered 2020-11-04 – 2020-11-12 (×7): 0.5 mg
  Filled 2020-11-03 (×7): qty 1

## 2020-11-03 MED ORDER — SODIUM ZIRCONIUM CYCLOSILICATE 10 G PO PACK
10.0000 g | PACK | ORAL | Status: AC
  Administered 2020-11-03: 10 g
  Filled 2020-11-03: qty 1

## 2020-11-03 MED ORDER — ONDANSETRON HCL 4 MG PO TABS: 4.0000 mg | ORAL_TABLET | Freq: Four times a day (QID) | ORAL | Status: DC | PRN

## 2020-11-03 MED ORDER — SODIUM ZIRCONIUM CYCLOSILICATE 10 G PO PACK: 10.0000 g | PACK | ORAL | Status: DC

## 2020-11-03 MED ORDER — CLOPIDOGREL BISULFATE 75 MG PO TABS: 75.0000 mg | ORAL_TABLET | Freq: Every day | ORAL | Status: DC

## 2020-11-03 MED ORDER — EZETIMIBE 10 MG PO TABS: 10.0000 mg | ORAL_TABLET | Freq: Every day | ORAL | Status: DC

## 2020-11-03 MED ORDER — ACETAMINOPHEN 650 MG RE SUPP: 650.0000 mg | Freq: Four times a day (QID) | RECTAL | Status: DC | PRN

## 2020-11-03 MED ORDER — APIXABAN 5 MG PO TABS
5.0000 mg | ORAL_TABLET | Freq: Two times a day (BID) | ORAL | Status: DC
  Administered 2020-11-03 – 2020-11-12 (×15): 5 mg
  Filled 2020-11-03 (×15): qty 1

## 2020-11-03 MED ORDER — BETHANECHOL CHLORIDE 25 MG PO TABS
50.0000 mg | ORAL_TABLET | Freq: Two times a day (BID) | ORAL | Status: DC
  Filled 2020-11-03 (×2): qty 2

## 2020-11-03 MED ORDER — HYDRALAZINE HCL 25 MG PO TABS: 25.0000 mg | ORAL_TABLET | Freq: Four times a day (QID) | ORAL | Status: DC | PRN

## 2020-11-03 MED ORDER — CALCIUM CARBONATE-VITAMIN D 500-200 MG-UNIT PO TABS
ORAL_TABLET | Freq: Two times a day (BID) | ORAL | Status: DC
  Administered 2020-11-03 – 2020-11-04 (×3): 1 via ORAL
  Filled 2020-11-03 (×5): qty 1

## 2020-11-03 MED ORDER — OMEGA-3-ACID ETHYL ESTERS 1 G PO CAPS: 1.0000 g | ORAL_CAPSULE | Freq: Every day | ORAL | Status: DC

## 2020-11-03 MED ORDER — ENOXAPARIN SODIUM 40 MG/0.4ML ~~LOC~~ SOLN: 40.0000 mg | SUBCUTANEOUS | Status: DC

## 2020-11-03 MED ORDER — CLONAZEPAM 0.5 MG PO TABS
0.5000 mg | ORAL_TABLET | Freq: Every day | ORAL | Status: DC
  Administered 2020-11-03 – 2020-11-10 (×6): 0.5 mg
  Filled 2020-11-03 (×6): qty 1

## 2020-11-03 MED ORDER — POLYETHYLENE GLYCOL 3350 17 G PO PACK: 17.0000 g | PACK | Freq: Every day | ORAL | Status: DC | PRN

## 2020-11-03 MED ORDER — SODIUM CHLORIDE 0.9 % IV SOLN: INTRAVENOUS | Status: DC

## 2020-11-03 MED ORDER — BOOST PO LIQD: 237.0000 mL | Freq: Three times a day (TID) | ORAL | Status: DC | PRN

## 2020-11-03 MED ORDER — ACETAMINOPHEN 325 MG PO TABS: 650.0000 mg | ORAL_TABLET | Freq: Four times a day (QID) | ORAL | Status: DC | PRN

## 2020-11-03 MED ORDER — FOLIC ACID 0.5 MG HALF TAB
0.5000 mg | ORAL_TABLET | Freq: Every day | ORAL | Status: DC
  Filled 2020-11-03: qty 1

## 2020-11-03 MED ORDER — QUETIAPINE FUMARATE 25 MG PO TABS
12.5000 mg | ORAL_TABLET | Freq: Every day | ORAL | Status: DC
  Administered 2020-11-03 – 2020-11-12 (×7): 12.5 mg
  Filled 2020-11-03 (×8): qty 1

## 2020-11-03 MED ORDER — ESCITALOPRAM OXALATE 10 MG PO TABS
10.0000 mg | ORAL_TABLET | Freq: Every day | ORAL | Status: DC
  Administered 2020-11-03 – 2020-11-05 (×3): 10 mg
  Filled 2020-11-03 (×3): qty 1

## 2020-11-03 MED ORDER — FAMOTIDINE 20 MG PO TABS
10.0000 mg | ORAL_TABLET | Freq: Two times a day (BID) | ORAL | Status: DC
Start: 2020-11-03 — End: 2020-11-03

## 2020-11-03 MED ORDER — LACTATED RINGERS IV BOLUS (SEPSIS)
1000.0000 mL | Freq: Once | INTRAVENOUS | Status: AC
  Administered 2020-11-03: 1000 mL via INTRAVENOUS

## 2020-11-03 MED ORDER — CRANBERRY-VITAMIN C-VITAMIN E 140-100-3 MG-MG-UNIT PO CAPS: 2000.0000 [IU] | ORAL_CAPSULE | ORAL | Status: DC

## 2020-11-03 MED ORDER — CARVEDILOL 6.25 MG PO TABS: 6.2500 mg | ORAL_TABLET | Freq: Two times a day (BID) | ORAL | Status: DC

## 2020-11-03 NOTE — Progress Notes (Signed)
Patient was transferred to ICU d/t abnormal labs and requiring a higher level of care.

## 2020-11-03 NOTE — ED Triage Notes (Signed)
Pt arrived by EMS from home c/o Vomiting on Friday and a cough that started Saturday. Pt unable to control her secretions or force a cough, clear drool continuously dripping out of pt mouth. Airway maintained no increased work of breathing   Pt is bedridden with foley and g-tube, cognitive deficeit from previous stroke

## 2020-11-03 NOTE — Progress Notes (Addendum)
Nephrology lab follow up. Evening lab showed Na 113 and K 5.8. No improvement in Na level with NS. Urine NA and osmolality still pending. She has some AMS as well.  Plan: DC NS and start 3% hypertonic saline at 50 cc/hr via peripheral line. Utilized order set for hypertonic saline. Goal Na 116-118. Monitor lab, [place]. Order [facility] for hyperkalemia via feeding tube. Discussed with bedside nurse.  Katheran James, MD Nephrology.

## 2020-11-03 NOTE — ED Notes (Signed)
Critical Sodium reported to Provider

## 2020-11-03 NOTE — ED Provider Notes (Signed)
[facility] EMERGENCY DEPARTMENT Provider Note   CSN: [id number] Arrival date & time: 11/03/20  1117     History Chief Complaint  Patient presents with  . Cough    [name] is a 87 y.o. female.  Level 5 caveat secondary to cognitive deficits.  History provided by EMS.  Patient is coming from home by EMS.  Was vomiting about a week ago that is resolved.  Has had a cough for almost a week.  Chronic Foley.  Left-sided deficits.  Patient denies chest or abdominal pain.  Difficult to get any history from her.  She denies cough  The history is provided by the patient and the EMS personnel.  Cough Cough characteristics:  Unable to specify Sputum characteristics:  Nondescript Severity:  Moderate Onset quality:  Gradual Timing:  Intermittent Progression:  Unchanged Relieved by:  None tried Worsened by:  Nothing Ineffective treatments:  None tried Associated symptoms: no chest pain and no fever        Past Medical History:  Diagnosis Date  . Anemia   . Arthritis    "was in my knees"  . High cholesterol   . History of blood transfusion    "S/P knee OR"  . Hypertension   . Hypothyroidism   . Pneumonia    "maybe in my teens"  . Recurrent UTI (urinary tract infection)   . Stroke [facility]) 1990's   "mild", denies residual on 01/28/2014    Patient Active Problem List   Diagnosis Date Noted  . Acute ischemic right MCA stroke ([place]) 01/23/2016  . Gait disturbance, post-stroke   . Hemiparesis ([place])   . Essential hypertension   . Dyslipidemia   . Thyroid activity decreased   . E-coli UTI   . Dyspepsia   . Dehydration   . Acute blood loss anemia   . Intracranial vascular stenosis   . Ischemic stroke ([place]) 01/19/2016  . Recurrent UTI 01/19/2016  . HLD (hyperlipidemia) 01/19/2016  . Intertrochanteric fracture of right hip ([place]) 03/23/2014  . HTN (hypertension) 03/23/2014  . Hypothyroidism 03/23/2014  . Anemia 03/23/2014  . Thrombocytopenia,  unspecified ([place]) 03/23/2014  . Closed right hip fracture ([place]) 03/23/2014  . Pseudoaneurysm of right femoral artery ([place]) 01/31/2014  . Pseudoaneurysm of femoral artery ([place]) 01/30/2014  . Splenic artery aneurysm ([place]) 12/04/2013    Past Surgical History:  Procedure Laterality Date  . BLADDER SUSPENSION  2012  . EMBOLIZATION N/A 01/28/2014   Procedure: EMBOLIZATION;  Surgeon: Conrad Windsor, MD;  Location: [facility] CATH LAB;  Service: Cardiovascular;  Laterality: N/A;  Splenic Artery  . FEMUR IM NAIL Right 03/23/2014   Procedure: INTRAMEDULLARY (IM) NAIL FEMORAL;  Surgeon: Mauri Pole, MD;  Location: [facility] ORS;  Service: Orthopedics;  Laterality: Right;  . JOINT REPLACEMENT    . MUSCLE BIOPSY Left 1990's   "knot biopsy from carrying mail bag for years"  . REVISON OF ARTERIOVENOUS FISTULA Right 01/31/2014   Procedure: REPAIR PSEUDOANEURYSM;  Surgeon: Serafina Mitchell, MD;  Location: [facility] OR;  Service: Vascular;  Laterality: Right;  Repair of Femoral Pseudoaneurysm.  . SPLENIC ARTERY EMBOLIZATION  01/28/2014  . TOTAL KNEE ARTHROPLASTY Bilateral 2012  . VAGINAL HYSTERECTOMY    . VISCERAL ANGIOGRAM N/A 01/28/2014   Procedure: VISCERAL ANGIOGRAM;  Surgeon: Conrad Minden, MD;  Location: [facility] CATH LAB;  Service: Cardiovascular;  Laterality: N/A;     OB History   No obstetric history on file.     Family History  Problem  Relation Age of Onset  . Hypertension Son   . Heart attack Neg Hx     Social History   Tobacco Use  . Smoking status: Never Smoker  . Smokeless tobacco: Never Used  Substance Use Topics  . Alcohol use: No    Alcohol/week: 0.0 standard drinks  . Drug use: No    Home Medications Prior to Admission medications   Medication Sig Start Date End Date Taking? Authorizing Provider  bethanechol (URECHOLINE) 50 MG tablet Take 50 mg by mouth 2 (two) times daily.    [provider]  Calcium Carbonate-Vitamin D (CALCIUM 500 + D PO) Take 500 mg by mouth 2 (two) times daily.     [provider]  cholecalciferol (VITAMIN D) 1000 UNITS tablet Take 2,000 Units by mouth daily.    [provider]  clopidogrel (PLAVIX) 75 MG tablet Take 1 tablet (75 mg total) by mouth daily. 02/01/16   Love, Ivan Anchors, PA-C  Cranberry-Vitamin C-Vitamin E (CRANBERRY PLUS VITAMIN C) 140-100-3 MG-MG-UNIT CAPS Take 2,000 Units by mouth every morning.     [provider]  D-Mannose POWD Take by mouth.    [provider]  ezetimibe (ZETIA) 10 MG tablet Take 1 tablet (10 mg total) by mouth at bedtime. 02/01/16   Love, Ivan Anchors, PA-C  famotidine (PEPCID) 10 MG tablet Take 1 tablet (10 mg total) by mouth 2 (two) times daily. Patient taking differently: Take 10 mg by mouth 2 (two) times daily as needed.  02/01/16   Love, Ivan Anchors, PA-C  folic acid (FOLVITE) 270 MCG tablet Take 400 mcg by mouth daily.    [provider]  labetalol (NORMODYNE) 200 MG tablet Take 1.5 tablets (300 mg total) by mouth 2 (two) times daily. 02/01/16   Love, Ivan Anchors, PA-C  lactose free nutrition (BOOST) LIQD Take 237 mLs by mouth 3 (three) times daily as needed (meal replacement).    [provider]  levothyroxine (SYNTHROID, LEVOTHROID) 50 MCG tablet Take 50 mcg by mouth daily before breakfast.    [provider]  lisinopril (PRINIVIL,ZESTRIL) 40 MG tablet Take 1 tablet (40 mg total) by mouth at bedtime. 02/01/16   Love, Ivan Anchors, PA-C  Omega-3 Fatty Acids (FISH OIL) 1000 MG CAPS Take 1,000 mg by mouth 2 (two) times daily.    [provider]  pravastatin (PRAVACHOL) 20 MG tablet Take 1 tablet (20 mg total) by mouth daily. 02/01/16   Love, Ivan Anchors, PA-C    Allergies    Aspirin  Review of Systems   Review of Systems  Unable to perform ROS: Mental status change  Constitutional: Negative for fever.  Respiratory: Positive for cough.   Cardiovascular: Negative for chest pain.    Physical Exam Updated Vital Signs BP (!) 94/55   Pulse 69   Temp (!) 97.2 F  (36.2 C)   Resp 20   SpO2 100%   Physical Exam Vitals and nursing note reviewed.  Constitutional:      General: She is awake. She is not in acute distress.    Appearance: Normal appearance. She is well-developed and well-nourished.  HENT:     Head: Normocephalic and atraumatic.  Eyes:     Conjunctiva/sclera: Conjunctivae normal.  Cardiovascular:     Rate and Rhythm: Normal rate and regular rhythm.     Heart sounds: No murmur heard.   Pulmonary:     Effort: Pulmonary effort is normal. No respiratory distress.     Breath sounds: Normal breath  sounds.  Abdominal:     Palpations: Abdomen is soft.     Tenderness: There is no abdominal tenderness. There is no guarding or rebound.     Comments: G-tube upper abdomen without signs of infection  Musculoskeletal:        General: No edema.     Cervical back: Neck supple.     Right lower leg: No edema.     Left lower leg: No edema.  Skin:    General: Skin is warm and dry.  Neurological:     Mental Status: Mental status is at baseline.     Motor: Weakness present.     Comments: She has contractures left upper extremity.  Will follow commands on right side.  Psychiatric:        Mood and Affect: Mood and affect normal.        Behavior: Behavior is cooperative.     ED Results / Procedures / Treatments   Labs (all labs ordered are listed, but only abnormal results are displayed) Labs Reviewed  CULTURE, BLOOD (SINGLE)  URINE CULTURE  LACTIC ACID, PLASMA  LACTIC ACID, PLASMA  COMPREHENSIVE METABOLIC PANEL  CBC WITH DIFFERENTIAL/PLATELET  PROTIME-INR  APTT  URINALYSIS, ROUTINE W REFLEX MICROSCOPIC  LIPASE, BLOOD  BRAIN NATRIURETIC PEPTIDE  TROPONIN I (HIGH SENSITIVITY)    EKG EKG Interpretation  Date/Time:  Thursday November 03 2020 11:23:31 EST Ventricular Rate:  67 PR Interval:    QRS Duration: 140 QT Interval:  430 QTC Calculation: 454 R Axis:   -13 Text Interpretation: Junctional rhythm vs sinus Nonspecific  intraventricular conduction delay Borderline repolarization abnormality No significant change since prior 5/17 Confirmed by Aletta Edouard [phone]) on 11/03/2020 11:31:57 AM   Radiology No results found.  Procedures .Critical Care Performed by: Hayden Rasmussen, MD Authorized by: Hayden Rasmussen, MD   Critical care provider statement:    Critical care time (minutes):  45   Critical care time was exclusive of:  Separately billable procedures and treating other patients   Critical care was necessary to treat or prevent imminent or life-threatening deterioration of the following conditions:  Metabolic crisis   Critical care was time spent personally by me on the following activities:  Discussions with consultants, evaluation of patient's response to treatment, examination of patient, ordering and performing treatments and interventions, ordering and review of laboratory studies, ordering and review of radiographic studies, pulse oximetry, re-evaluation of patient's condition, obtaining history from patient or surrogate, review of old charts and development of treatment plan with patient or surrogate     Medications Ordered in ED Medications  calcium-vitamin D (OSCAL WITH D) 500-200 MG-UNIT per tablet (1 tablet Oral Given 11/03/20 2121)  escitalopram (LEXAPRO) tablet 10 mg (10 mg Per Tube Given 11/03/20 1850)  QUEtiapine (SEROQUEL) tablet 12.5 mg (12.5 mg Per Tube Given 11/03/20 2121)  apixaban (ELIQUIS) tablet 5 mg (5 mg Per Tube Given 11/03/20 2121)  clonazePAM (KLONOPIN) tablet 0.5 mg (0.5 mg Per Tube Given 11/03/20 2122)  bethanechol (URECHOLINE) tablet 50 mg (50 mg Per Tube Given 02/11/53 0981)  folic acid (FOLVITE) tablet 0.5 mg (has no administration in time range)  levothyroxine (SYNTHROID) tablet 50 mcg (50 mcg Per Tube Given 11/04/20 0636)  acetaminophen (TYLENOL) tablet 650 mg (has no administration in time range)    Or  acetaminophen (TYLENOL) suppository 650 mg (has no  administration in time range)  hydrALAZINE (APRESOLINE) tablet 25 mg (has no administration in time range)  ondansetron (ZOFRAN) tablet 4 mg (has  no administration in time range)    Or  ondansetron (ZOFRAN) injection 4 mg (has no administration in time range)  polyethylene glycol (MIRALAX / GLYCOLAX) packet 17 g (has no administration in time range)  Chlorhexidine Gluconate Cloth 2 % PADS 6 each (has no administration in time range)  lactated ringers bolus 1,000 mL (0 mLs Intravenous Stopped 11/03/20 1236)  sodium zirconium cyclosilicate (LOKELMA) packet 10 g (10 g Per Tube Given 11/03/20 1850)  sodium zirconium cyclosilicate (LOKELMA) packet 10 g (10 g Per Tube Given 11/03/20 2121)    ED Course  I have reviewed the triage vital signs and the nursing notes.  Pertinent labs & imaging results that were available during my care of the patient were reviewed by me and considered in my medical decision making (see chart for details).  Clinical Course as of 11/04/20 0830  Thu Nov 03, 2020  1219 Chest x-ray interpreted by me as no acute pulmonary findings [MB]  75 Patient son is here now and is able to give a little bit more history.  She is nonambulatory at baseline lives at home.  She was treated for a possible UTI with a catheter exchange and 5 days of antibiotics question Macrobid.  She was vomiting and has had poor p.o. intake for over a week.  Cough.  They have been giving her Osmolite 1.5 and free water through her feeding tube.  Usually will feed enough orally and does not require tube feeds.  He noticed she has been more sleepy over the past few days.  He thinks her ability to communicate now is at her baseline. [MB]  1607 Discussed with Dr. Carolin Sicks from nephrology.  He asked if we can get the patient another BMP to see where we are after that liter of LR. [MB]  1416 Sodium(!!): 110 [MB]  1416 Potassium(!): 5.8 [MB]  1424 Discussed with Dr. Roosevelt Locks [facility] hospitalist who will evaluate the  patient for admission. [MB]    Clinical Course User Index [MB] Hayden Rasmussen, MD   MDM Rules/Calculators/A&P                         [name] was evaluated in Emergency Department on 11/03/2020 for the symptoms described in the history of present illness. She was evaluated in the context of the global COVID-19 pandemic, which necessitated consideration that the patient might be at risk for infection with the SARS-CoV-2 virus that causes COVID-19. Institutional protocols and algorithms that pertain to the evaluation of patients at risk for COVID-19 are in a state of rapid change based on information released by regulatory bodies including the CDC and federal and state organizations. These policies and algorithms were followed during the patient's care in the ED.  This patient complains of cough nausea vomiting weakness; this involves an extensive number of treatment Options and is a complaint that carries with it a high risk of complications and Morbidity. The differential includes COVID, pneumonia, aspiration, obstruction metabolic derangement, ACS   I ordered, reviewed and interpreted labs, which included CBC with normal white count, hemoglobin slightly lower than baseline, chemistries with critically low sodium level 110 and elevated potassium of 5.8.  Fairly normal renal function.  BNP mildly elevated I ordered medication IV fluids I ordered imaging studies which included chest x-ray and I independently    visualized and interpreted imaging which showed no acute infiltrates Additional history obtained from EMS and patient's son Previous records obtained and reviewed  in epic, no recent admissions I consulted Dr. Carolin Sicks nephrology and Dr. Roosevelt Locks [facility] hospitalist and discussed lab and imaging findings  Critical Interventions: Identification management of patient's metabolic derangement.  She will require serial BMPs to slowly correct her sodium.  High risk for seizure  After the  interventions stated above, I reevaluated the patient and found patient still to have waxing and waning mental status.  Updated son on plan for admission and he is agreeable   Final Clinical Impression(s) / ED Diagnoses Final diagnoses:  Hyponatremia  Hypotension, unspecified hypotension type  Hyperkalemia    Rx / DC Orders ED Discharge Orders    None       Hayden Rasmussen, MD 11/04/20 [phone]

## 2020-11-03 NOTE — Progress Notes (Signed)
PHARMACIST - PHYSICIAN ORDER COMMUNICATION  CONCERNING: P&T Medication Policy on Herbal Medications  DESCRIPTION:  This patient's order for:  Cranberry-vitamin C-vitamin E  has been noted.  This product(s) is classified as an "herbal" or natural product. Due to a lack of definitive safety studies or FDA approval, nonstandard manufacturing practices, plus the potential risk of unknown drug-drug interactions while on inpatient medications, the Pharmacy and Therapeutics Committee does not permit the use of "herbal" or natural products of this type within [facility].   ACTION TAKEN: The pharmacy department is unable to verify this order at this time and your patient has been informed of this safety policy. Please reevaluate patient's clinical condition at discharge and address if the herbal or natural product(s) should be resumed at that time.  Dimple Nanas, PharmD PGY-1 Acute Care Pharmacy Resident Office: [phone] 11/03/2020 5:02 PM

## 2020-11-03 NOTE — ED Notes (Signed)
Lab results was reported to Dr. Melina Copa.

## 2020-11-03 NOTE — Progress Notes (Signed)
Called by pharmacist. Nephrology has ordered 3% saline infusion which requires pt to be transferred to ICU. Transfer order placed to move pt to ICU and continue current care

## 2020-11-03 NOTE — H&P (Addendum)
History and Physical    [name] TKZ:[id number] DOB: [birth year] DOA: 11/03/2020  PCP: Paulina Fusi, MD (Confirm with patient/family/NH records and if not entered, this has to be entered at [facility] point of entry) Patient coming from: Home  I have personally briefly reviewed patient's old medical records in [facility]  Chief Complaint: AMS  HPI: [name] is a 87 y.o. female with medical history significant of stroke with residual left-sided weakness on Eliquis since, chronic dysphagia s/p G tube, HTN, chronic urinary retention status post Foley catheter (since November 2021), hypothyroidism, presented with change of mentation.  Baseline, patient able to communicate with family members, uses peddled mini-bike to move around.  Able to eat solid food but not enough liquid intake, as a result, liquid intake mostly via G-tube usually 60 ounces of water every day.  Patient began to have symptoms since last Friday, patient started to have frequent vomiting every time after eating, with stomach content and sometimes streak of blood and blood for 1 day.  Saturday symptoms resolved, but patient appeared to be more lethargic since, and po intake decreased significantly.  Son started tube feeding over the weekend with Osmolite.  Also developed periodic confusions, no fever, no abd pain and no diarrhea.  Patient has had chronic urinary retention and a Foley catheter was placed in 4 months ago with Foley exchanged every 3 days.  Since then patient has developed multiple episodes of UTIs.  Most recently patient developed another UTI last Thursday and was treated with 5 days of Macrobid and the last dose was this Monday.  ED Course: Sodium 110, blood pressure on the borderline low side, monitor off of work given, sodium improved.to 113.  Chest x-ray clear, UA showed UTI versus colonization.  Review of Systems: Unable to perform, patient confused.   Past Medical History:  Diagnosis  Date  . Anemia   . Arthritis    "was in my knees"  . High cholesterol   . History of blood transfusion    "S/P knee OR"  . Hypertension   . Hypothyroidism   . Pneumonia    "maybe in my teens"  . Recurrent UTI (urinary tract infection)   . Stroke [facility]) 1990's   "mild", denies residual on 01/28/2014    Past Surgical History:  Procedure Laterality Date  . BLADDER SUSPENSION  2012  . EMBOLIZATION N/A 01/28/2014   Procedure: EMBOLIZATION;  Surgeon: Fransisco Hertz, MD;  Location: [facility] CATH LAB;  Service: Cardiovascular;  Laterality: N/A;  Splenic Artery  . FEMUR IM NAIL Right 03/23/2014   Procedure: INTRAMEDULLARY (IM) NAIL FEMORAL;  Surgeon: Shelda Pal, MD;  Location: [facility] ORS;  Service: Orthopedics;  Laterality: Right;  . JOINT REPLACEMENT    . MUSCLE BIOPSY Left 1990's   "knot biopsy from carrying mail bag for years"  . REVISON OF ARTERIOVENOUS FISTULA Right 01/31/2014   Procedure: REPAIR PSEUDOANEURYSM;  Surgeon: Nada Libman, MD;  Location: [facility] OR;  Service: Vascular;  Laterality: Right;  Repair of Femoral Pseudoaneurysm.  . SPLENIC ARTERY EMBOLIZATION  01/28/2014  . TOTAL KNEE ARTHROPLASTY Bilateral 2012  . VAGINAL HYSTERECTOMY    . VISCERAL ANGIOGRAM N/A 01/28/2014   Procedure: VISCERAL ANGIOGRAM;  Surgeon: Fransisco Hertz, MD;  Location: [facility] CATH LAB;  Service: Cardiovascular;  Laterality: N/A;     reports that she has never smoked. She has never used smokeless tobacco. She reports that she does not drink alcohol and does not use drugs.  Allergies  Allergen Reactions  . Aspirin Other (See Comments)    Causes blood in urine    Family History  Problem Relation Age of Onset  . Hypertension Son   . Heart attack Neg Hx      Prior to Admission medications   Medication Sig Start Date End Date Taking? Authorizing Provider  bethanechol (URECHOLINE) 50 MG tablet Take 50 mg by mouth 2 (two) times daily.    [provider]  Calcium Carbonate-Vitamin D (CALCIUM 500 + D PO)  Take 500 mg by mouth 2 (two) times daily.    [provider]  cholecalciferol (VITAMIN D) 1000 UNITS tablet Take 2,000 Units by mouth daily.    [provider]  clopidogrel (PLAVIX) 75 MG tablet Take 1 tablet (75 mg total) by mouth daily. 02/01/16   Love, Evlyn Kanner, PA-C  Cranberry-Vitamin C-Vitamin E (CRANBERRY PLUS VITAMIN C) 140-100-3 MG-MG-UNIT CAPS Take 2,000 Units by mouth every morning.     [provider]  D-Mannose POWD Take by mouth.    [provider]  ezetimibe (ZETIA) 10 MG tablet Take 1 tablet (10 mg total) by mouth at bedtime. 02/01/16   Love, Evlyn Kanner, PA-C  famotidine (PEPCID) 10 MG tablet Take 1 tablet (10 mg total) by mouth 2 (two) times daily. Patient taking differently: Take 10 mg by mouth 2 (two) times daily as needed.  02/01/16   Love, Evlyn Kanner, PA-C  folic acid (FOLVITE) 400 MCG tablet Take 400 mcg by mouth daily.    [provider]  labetalol (NORMODYNE) 200 MG tablet Take 1.5 tablets (300 mg total) by mouth 2 (two) times daily. 02/01/16   Love, Evlyn Kanner, PA-C  lactose free nutrition (BOOST) LIQD Take 237 mLs by mouth 3 (three) times daily as needed (meal replacement).    [provider]  levothyroxine (SYNTHROID, LEVOTHROID) 50 MCG tablet Take 50 mcg by mouth daily before breakfast.    [provider]  lisinopril (PRINIVIL,ZESTRIL) 40 MG tablet Take 1 tablet (40 mg total) by mouth at bedtime. 02/01/16   Love, Evlyn Kanner, PA-C  Omega-3 Fatty Acids (FISH OIL) 1000 MG CAPS Take 1,000 mg by mouth 2 (two) times daily.    [provider]  pravastatin (PRAVACHOL) 20 MG tablet Take 1 tablet (20 mg total) by mouth daily. 02/01/16   Jacquelynn Cree, PA-C    Physical Exam: Vitals:   11/03/20 1400 11/03/20 1415 11/03/20 1430 11/03/20 1445  BP: 107/71     Pulse: 64 67 67 65  Resp: 16 13 17 14   Temp:      SpO2: 98% 98% 99% 98%    Constitutional: NAD, calm, comfortable Vitals:   11/03/20 1400 11/03/20 1415 11/03/20  1430 11/03/20 1445  BP: 107/71     Pulse: 64 67 67 65  Resp: 16 13 17 14   Temp:      SpO2: 98% 98% 99% 98%   Eyes: PERRL, lids and conjunctivae normal ENMT: Mucous membranes are moist. Posterior pharynx clear of any exudate or lesions.Normal dentition.  Neck: normal, supple, no masses, no thyromegaly Respiratory: clear to auscultation bilaterally, no wheezing, no crackles. Normal respiratory effort. No accessory muscle use.  Cardiovascular: Regular rate and rhythm, loud systolic murmur heart base. No extremity edema. 2+ pedal pulses. No carotid bruits.  Abdomen: no tenderness, no masses palpated. No hepatosplenomegaly. Bowel sounds positive.  Musculoskeletal: no clubbing / cyanosis. No joint deformity upper and lower extremities. Good ROM, no contractures. Normal muscle tone.  Skin:  no rashes, lesions, ulcers. No induration Neurologic: Chronic left-sided facial droop and left-sided weakness Psychiatric: Confused but following simple command.    Labs on Admission: I have personally reviewed following labs and imaging studies  CBC: Recent Labs  Lab 11/03/20 1150 11/03/20 1438  WBC 5.3  --   NEUTROABS 4.4  --   HGB 10.5* 8.8*  HCT 29.9* 26.0*  MCV 88.5  --   PLT 122*  --    Basic Metabolic Panel: Recent Labs  Lab 11/03/20 1150 11/03/20 1438  NA 110* 113*  K 5.8* 5.6*  CL 84* 83*  CO2 18*  --   GLUCOSE 107* 126*  BUN 30* 26*  CREATININE 0.62 0.70  CALCIUM 8.8*  --    GFR: CrCl cannot be calculated (Unknown ideal weight.). Liver Function Tests: Recent Labs  Lab 11/03/20 1150  AST 18  ALT 10  ALKPHOS 70  BILITOT 1.2  PROT 5.3*  ALBUMIN 3.0*   Recent Labs  Lab 11/03/20 1150  LIPASE 40   No results for input(s): AMMONIA in the last 168 hours. Coagulation Profile: Recent Labs  Lab 11/03/20 1150  INR 1.5*   Cardiac Enzymes: No results for input(s): CKTOTAL, CKMB, CKMBINDEX, TROPONINI in the last 168 hours. BNP (last 3 results) No results for input(s):  PROBNP in the last 8760 hours. HbA1C: No results for input(s): HGBA1C in the last 72 hours. CBG: No results for input(s): GLUCAP in the last 168 hours. Lipid Profile: No results for input(s): CHOL, HDL, LDLCALC, TRIG, CHOLHDL, LDLDIRECT in the last 72 hours. Thyroid Function Tests: No results for input(s): TSH, T4TOTAL, FREET4, T3FREE, THYROIDAB in the last 72 hours. Anemia Panel: No results for input(s): VITAMINB12, FOLATE, FERRITIN, TIBC, IRON, RETICCTPCT in the last 72 hours. Urine analysis:    Component Value Date/Time   COLORURINE YELLOW 11/03/2020 1139   APPEARANCEUR CLOUDY (A) 11/03/2020 1139   LABSPEC 1.006 11/03/2020 1139   PHURINE 6.0 11/03/2020 1139   GLUCOSEU NEGATIVE 11/03/2020 1139   HGBUR MODERATE (A) 11/03/2020 1139   BILIRUBINUR NEGATIVE 11/03/2020 1139   KETONESUR NEGATIVE 11/03/2020 1139   PROTEINUR NEGATIVE 11/03/2020 1139   UROBILINOGEN 0.2 03/23/2014 0403   NITRITE NEGATIVE 11/03/2020 1139   LEUKOCYTESUR LARGE (A) 11/03/2020 1139    Radiological Exams on Admission: DG Chest Port 1 View  Result Date: 11/03/2020 CLINICAL DATA:  Questionable sepsis. EXAM: PORTABLE CHEST 1 VIEW COMPARISON:  Chest x-ray 07/03/2018. FINDINGS: Mediastinum is stable. Heart size normal. No focal infiltrate. Biapical pleural-parenchymal thickening again noted consistent scarring. No pleural effusion or pneumothorax. Prominent skin fold on the right. Degenerative change thoracic spine. IMPRESSION: No acute cardiopulmonary disease. Electronically Signed   By: Maisie Fus  Register   On: 11/03/2020 12:09    EKG: Independently reviewed.  Chronic Q wave on an old hydrocodone at this total time: Earlene Plater patient 56 [address] claims GI bleed  Assessment/Plan Active Problems:   Hyponatremia  (please populate well all problems here in Problem List. (For example, if patient is on BP meds at home and you resume or decide to hold them, it is a problem that needs to be her. Same for CAD, COPD, HLD  and so on)  Symptomatic hyponatremia with acute metabolic encephalopathy -Appears to be hypovolemic on arrival, and labs showed hypochloridemia also implies hypovolemia overall. Na level improved 110>113 after 1 L of LR.  Nephrology on board. Will decide 3% saline use.  Admit to stepdown unit for close monitoring, discussed with patient's son at bedside, who expressed understanding  and agreed with plan. -Repeat sodium level in 6 hours none tomorrow morning. -Check TSH, lipid -CT head negative, continue Eliquis  Chronic urinary retention status post Foley indwelling -UA probably represent colonization rather than UTI, patient just completed 5 days course of Macrobid 2 days ago. -Foley was exchanged on Monday.  Hyperkalemia -Hold ACEI  Non-anion gap metabolic acidosis -No diarrhea, no AKI, will talk to Nephro attending about whether bicarb needs to be given.  Dysphagia -N.p.o. -Consult nutrition for tube feeding, recommend use normal saline instead of free water to flush.  HTN -Blood pressure borderline, hold home BP meds, as needed hydralazine for now  Hypothyroidism -Continue Synthroid  Chronic anemia -Check iron study -Continue famotidine  Heart murmur -No Hx of valvular disease, no signs of CHF, recommend outpatient Echo.   DVT prophylaxis: Lovenox  code Status: Full Code Family Communication: Son at bedside Disposition Plan: Expect sodium level will be corrected in 2 to 3 days. Consults called: Nephro Admission status: PCU   Emeline General MD [facility] Pager [phone]  11/03/2020, 2:54 PM

## 2020-11-03 NOTE — Progress Notes (Signed)
eLink Physician-Brief Progress Note Patient Name: [name] DOB: 1934/09/09 MRN: [id number]   Date of Service  11/03/2020  HPI/Events of Note  Patient with copious watery diarrhea stools.  eICU Interventions  Flexiseal ordered.        Frederik Pear 11/03/2020, 11:50 PM

## 2020-11-03 NOTE — Consult Note (Addendum)
[facility] Nephrology Consultation Note  Requesting MD: Dr Melina Copa EDP and Dr Roosevelt Locks, P  Reason for consult: Hyponatremia.   HPI:  [name] is a 87 y.o. female with history of HTN, stroke with residual left-sided weakness, gastric feeding tube, chronic urinary retention on indwelling Foley catheter, hypothyroidism who was presented due to vomiting, decreased oral intake and increased coughing, seen as a consultation for the evaluation of hyponatremia. Patient is bedridden at baseline with cognitive deficit.  She is alert awake and able to communicate simple thing with families at baseline.  Also able to eat but for last few days he is becoming more lethargic therefore her son was feeding through the gastric tube with Osmolite and water. She was diagnosed with a UTI last week therefore prescribed Macrobid by her PCP. In the ER, she was initially hypotensive to systolic BP 38H.  The labs showed sodium 110, potassium 5.8, creatinine 0.62.  She received 1 L of LR with repeat labs showed a sodium of 139 potassium 5.6.  We are consulted to evaluate hyponatremia. She is currently alert awake and following commands.  As per her son, patient's mental status is not far from her baseline.  She was alert awake and following simple commands to me.  She was not able to provide review of system or history to me. Home medication include lisinopril, Synthroid, labetalol, statin.  No diuretics noted.  Creatinine, Ser  Date/Time Value Ref Range Status  11/03/2020 02:38 PM 0.70 0.44 - 1.00 mg/dL Final  11/03/2020 11:50 AM 0.62 0.44 - 1.00 mg/dL Final  01/30/2016 06:01 AM 0.77 0.44 - 1.00 mg/dL Final    PMHx:   Past Medical History:  Diagnosis Date  . Anemia   . Arthritis    "was in my knees"  . High cholesterol   . History of blood transfusion    "S/P knee OR"  . Hypertension   . Hypothyroidism   . Pneumonia    "maybe in my teens"  . Recurrent UTI (urinary tract infection)   .  Stroke [facility]) 1990's   "mild", denies residual on 01/28/2014    Past Surgical History:  Procedure Laterality Date  . BLADDER SUSPENSION  2012  . EMBOLIZATION N/A 01/28/2014   Procedure: EMBOLIZATION;  Surgeon: Conrad Winkler, MD;  Location: [facility] CATH LAB;  Service: Cardiovascular;  Laterality: N/A;  Splenic Artery  . FEMUR IM NAIL Right 03/23/2014   Procedure: INTRAMEDULLARY (IM) NAIL FEMORAL;  Surgeon: Mauri Pole, MD;  Location: [facility] ORS;  Service: Orthopedics;  Laterality: Right;  . JOINT REPLACEMENT    . MUSCLE BIOPSY Left 1990's   "knot biopsy from carrying mail bag for years"  . REVISON OF ARTERIOVENOUS FISTULA Right 01/31/2014   Procedure: REPAIR PSEUDOANEURYSM;  Surgeon: Serafina Mitchell, MD;  Location: [facility] OR;  Service: Vascular;  Laterality: Right;  Repair of Femoral Pseudoaneurysm.  . SPLENIC ARTERY EMBOLIZATION  01/28/2014  . TOTAL KNEE ARTHROPLASTY Bilateral 2012  . VAGINAL HYSTERECTOMY    . VISCERAL ANGIOGRAM N/A 01/28/2014   Procedure: VISCERAL ANGIOGRAM;  Surgeon: Conrad [place], MD;  Location: [facility] CATH LAB;  Service: Cardiovascular;  Laterality: N/A;    Family Hx:  Family History  Problem Relation Age of Onset  . Hypertension Son   . Heart attack Neg Hx     Social History:  reports that she has never smoked. She has never used smokeless tobacco. She reports that she does not drink alcohol and does not use drugs.  Allergies:  Allergies  Allergen Reactions  . Aspirin Other (See Comments)    Causes blood in urine    Medications: Prior to Admission medications   Medication Sig Start Date End Date Taking? Authorizing Provider  bethanechol (URECHOLINE) 50 MG tablet Take 50 mg by mouth 2 (two) times daily.    [provider]  Calcium Carbonate-Vitamin D (CALCIUM 500 + D PO) Take 500 mg by mouth 2 (two) times daily.    [provider]  cholecalciferol (VITAMIN D) 1000 UNITS tablet Take 2,000 Units by mouth daily.    [provider]  clopidogrel  (PLAVIX) 75 MG tablet Take 1 tablet (75 mg total) by mouth daily. 02/01/16   Love, Ivan Anchors, PA-C  Cranberry-Vitamin C-Vitamin E (CRANBERRY PLUS VITAMIN C) 140-100-3 MG-MG-UNIT CAPS Take 2,000 Units by mouth every morning.     [provider]  D-Mannose POWD Take by mouth.    [provider]  ezetimibe (ZETIA) 10 MG tablet Take 1 tablet (10 mg total) by mouth at bedtime. 02/01/16   Love, Ivan Anchors, PA-C  famotidine (PEPCID) 10 MG tablet Take 1 tablet (10 mg total) by mouth 2 (two) times daily. Patient taking differently: Take 10 mg by mouth 2 (two) times daily as needed.  02/01/16   Love, Ivan Anchors, PA-C  folic acid (FOLVITE) 371 MCG tablet Take 400 mcg by mouth daily.    [provider]  labetalol (NORMODYNE) 200 MG tablet Take 1.5 tablets (300 mg total) by mouth 2 (two) times daily. 02/01/16   Love, Ivan Anchors, PA-C  lactose free nutrition (BOOST) LIQD Take 237 mLs by mouth 3 (three) times daily as needed (meal replacement).    [provider]  levothyroxine (SYNTHROID, LEVOTHROID) 50 MCG tablet Take 50 mcg by mouth daily before breakfast.    [provider]  lisinopril (PRINIVIL,ZESTRIL) 40 MG tablet Take 1 tablet (40 mg total) by mouth at bedtime. 02/01/16   Love, Ivan Anchors, PA-C  Omega-3 Fatty Acids (FISH OIL) 1000 MG CAPS Take 1,000 mg by mouth 2 (two) times daily.    [provider]  pravastatin (PRAVACHOL) 20 MG tablet Take 1 tablet (20 mg total) by mouth daily. 02/01/16   Love, Ivan Anchors, PA-C    I have reviewed the patient's current medications.  Labs:  Results for orders placed or performed during the hospital encounter of 11/03/20 (from the past 48 hour(s))  Lactic acid, plasma     Status: None   Collection Time: 11/03/20 11:39 AM  Result Value Ref Range   Lactic Acid, Venous 1.0 0.5 - 1.9 mmol/L    Comment: Performed at [facility], [address], [place], [place] [address]  Urinalysis, Routine w reflex microscopic Urine,  Catheterized     Status: Abnormal   Collection Time: 11/03/20 11:39 AM  Result Value Ref Range   Color, Urine YELLOW YELLOW   APPearance CLOUDY (A) CLEAR   Specific Gravity, Urine 1.006 1.005 - 1.030   pH 6.0 5.0 - 8.0   Glucose, UA NEGATIVE NEGATIVE mg/dL   Hgb urine dipstick MODERATE (A) NEGATIVE   Bilirubin Urine NEGATIVE NEGATIVE   Ketones, ur NEGATIVE NEGATIVE mg/dL   Protein, ur NEGATIVE NEGATIVE mg/dL   Nitrite NEGATIVE NEGATIVE   Leukocytes,Ua LARGE (A) NEGATIVE   RBC / HPF 0-5 0 - 5 RBC/hpf   WBC, UA 21-50 0 - 5 WBC/hpf   Bacteria, UA RARE (A) NONE SEEN   Squamous Epithelial / LPF 11-20 0 -  5    Comment: Performed at [facility], [place] [address], [place], [place] [address]  Brain natriuretic peptide     Status: Abnormal   Collection Time: 11/03/20 11:40 AM  Result Value Ref Range   B Natriuretic Peptide 338.5 (H) 0.0 - 100.0 pg/mL    Comment: Performed at [place] [address], [place], [place] [address]  Comprehensive metabolic panel     Status: Abnormal   Collection Time: 11/03/20 11:50 AM  Result Value Ref Range   Sodium 110 (LL) 135 - 145 mmol/L    Comment: CRITICAL RESULT CALLED TO, READ BACK BY AND VERIFIED WITH: J.DODD,RN 1320 11/03/20 CLARK,S    Potassium 5.8 (H) 3.5 - 5.1 mmol/L   Chloride 84 (L) 98 - 111 mmol/L   CO2 18 (L) 22 - 32 mmol/L   Glucose, Bld 107 (H) 70 - 99 mg/dL    Comment: Glucose reference range applies only to samples taken after fasting for at least 8 hours.   BUN 30 (H) 8 - 23 mg/dL   Creatinine, Ser 0.62 0.44 - 1.00 mg/dL   Calcium 8.8 (L) 8.9 - 10.3 mg/dL   Total Protein 5.3 (L) 6.5 - 8.1 g/dL   Albumin 3.0 (L) 3.5 - 5.0 g/dL   AST 18 15 - 41 U/L   ALT 10 0 - 44 U/L   Alkaline Phosphatase 70 38 - 126 U/L   Total Bilirubin 1.2 0.3 - 1.2 mg/dL   GFR, Estimated >60 >60 mL/min    Comment: (NOTE) Calculated using the CKD-EPI Creatinine Equation (2021)    Anion gap 8 5 - 15    Comment: Performed at [facility], [place] [address], [place], [place] [address]  CBC WITH DIFFERENTIAL     Status: Abnormal   Collection Time: 11/03/20 11:50 AM  Result Value Ref Range   WBC 5.3 4.0 - 10.5 K/uL   RBC 3.38 (L) 3.87 - 5.11 MIL/uL   Hemoglobin 10.5 (L) 12.0 - 15.0 g/dL   HCT 29.9 (L) 36.0 - 46.0 %   MCV 88.5 80.0 - 100.0 fL   MCH 31.1 26.0 - 34.0 pg   MCHC 35.1 30.0 - 36.0 g/dL   RDW 12.6 11.5 - 15.5 %   Platelets 122 (L) 150 - 400 K/uL   nRBC 0.0 0.0 - 0.2 %   Neutrophils Relative % 83 %   Neutro Abs 4.4 1.7 - 7.7 K/uL   Lymphocytes Relative 10 %   Lymphs Abs 0.5 (L) 0.7 - 4.0 K/uL   Monocytes Relative 5 %   Monocytes Absolute 0.3 0.1 - 1.0 K/uL   Eosinophils Relative 1 %   Eosinophils Absolute 0.0 0.0 - 0.5 K/uL   Basophils Relative 0 %   Basophils Absolute 0.0 0.0 - 0.1 K/uL   Immature Granulocytes 1 %   Abs Immature Granulocytes 0.04 0.00 - 0.07 K/uL    Comment: Performed at [facility], [address], [place], [place] [address]  Protime-INR     Status: Abnormal   Collection Time: 11/03/20 11:50 AM  Result Value Ref Range   Prothrombin Time 17.6 (H) 11.4 - 15.2 seconds   INR 1.5 (H) 0.8 - 1.2    Comment: (NOTE) INR goal varies based on device and disease states. Performed at [facility], [place] [address], [place], [place] [address]   APTT     Status: Abnormal   Collection Time: 11/03/20 11:50 AM  Result Value  Ref Range   aPTT 42 (H) 24 - 36 seconds    Comment:        IF BASELINE aPTT IS ELEVATED, SUGGEST PATIENT RISK ASSESSMENT BE USED TO DETERMINE APPROPRIATE ANTICOAGULANT THERAPY. Performed at [facility], [place] [address], [place], [place] [address]   Troponin I (High Sensitivity)     Status: None   Collection Time: 11/03/20 11:50 AM  Result Value Ref Range   Troponin I (High Sensitivity) 8 <18 ng/L    Comment: (NOTE) Elevated high sensitivity troponin I (hsTnI) values and significant  changes across serial measurements may suggest ACS but many other   chronic and acute conditions are known to elevate hsTnI results.  Refer to the Links section for chest pain algorithms and additional  guidance. Performed at [facility], [place] [address] [place], [place] [address]   Lipase, blood     Status: None   Collection Time: 11/03/20 11:50 AM  Result Value Ref Range   Lipase 40 11 - 51 U/L    Comment: Performed at [facility], [place] [address], [place], [place] [address]  I-stat chem 8, ED (not at [facility] or [facility])     Status: Abnormal   Collection Time: 11/03/20  2:38 PM  Result Value Ref Range   Sodium 113 (LL) 135 - 145 mmol/L   Potassium 5.6 (H) 3.5 - 5.1 mmol/L   Chloride 83 (L) 98 - 111 mmol/L   BUN 26 (H) 8 - 23 mg/dL   Creatinine, Ser 0.70 0.44 - 1.00 mg/dL   Glucose, Bld 126 (H) 70 - 99 mg/dL    Comment: Glucose reference range applies only to samples taken after fasting for at least 8 hours.   Calcium, Ion 1.21 1.15 - 1.40 mmol/L   TCO2 22 22 - 32 mmol/L   Hemoglobin 8.8 (L) 12.0 - 15.0 g/dL   HCT 26.0 (L) 36.0 - 46.0 %   Comment NOTIFIED PHYSICIAN      ROS: Unable to obtain review of system because of previous stroke  Physical Exam: Vitals:   11/03/20 1430 11/03/20 1445  BP:    Pulse: 67 65  Resp: 17 14  Temp:    SpO2: 99% 98%     General exam: Lying on bed comfortable, not in distress, Respiratory system: Clear to auscultation. Respiratory effort normal. No wheezing or crackle Cardiovascular system: S1 & S2 heard, RRR.  No pedal edema. Gastrointestinal system: Abdomen is nondistended, soft and nontender. Normal bowel sounds heard.  Gastric feeding tube in place Central nervous system: Alert, awake, opening mouth and moving head with commands.  Nonverbal Extremities: Left-sided weakness/paresis Skin: No rashes, lesions or ulcers Psychiatry: Alert awake and following commands..   Assessment/Plan:  #Subacute hyponatremia, mild hypovolemic: Due to GI loss and possibly low solute intake.  Urine is diluted (sp  gr 1.006) therefore less likely high ADH state.  Check  urine sodium, urine osmolality, serum osmolality, TSH level. Already received 1 L of LR with improvement of sodium from 110-113.   She looks comfortable and mental status close to her baseline per her son.  We will not do hypertonic saline because of risk of overcorrection.  I will start NS @100  cc an hour and check BMP every 3-4 hour.  If no appropriate response then she may need hypertonic saline this evening.  The goal sodium level around 116 by tomorrow morning.  #Hyperkalemia: Due to lisinopril and some dehydration.  On IV fluid.  Order  a dose of Lokelma.  Monitor lab.  #Recent urinary tract infection and chronic retention requiring indwelling Foley catheter: Reportedly received Macrobid by her PCP.  Follow-up culture results.  #Hypertension: Hypotensive on arrival.  BP improved after IV fluid.  Hold lisinopril.  #Stroke and left-sided paresis: Bedbound at baseline.  Thank you for the consult.  We will follow with you.  Deaaron Fulghum Tanna Furry 11/03/2020, 3:04 PM  [facility].

## 2020-11-03 NOTE — Progress Notes (Signed)
Patient untested for COVID in ER or upon admission to 2W, now being transferred to ICU; orders placed for screening via Resp PCR per protocol. Will need isolation until results are obtained.

## 2020-11-04 DIAGNOSIS — Z515 Encounter for palliative care: Secondary | ICD-10-CM

## 2020-11-04 DIAGNOSIS — Z7189 Other specified counseling: Secondary | ICD-10-CM

## 2020-11-04 DIAGNOSIS — E43 Unspecified severe protein-calorie malnutrition: Secondary | ICD-10-CM | POA: Insufficient documentation

## 2020-11-04 DIAGNOSIS — E871 Hypo-osmolality and hyponatremia: Secondary | ICD-10-CM | POA: Diagnosis not present

## 2020-11-04 DIAGNOSIS — L8990 Pressure ulcer of unspecified site, unspecified stage: Secondary | ICD-10-CM | POA: Insufficient documentation

## 2020-11-04 LAB — BASIC METABOLIC PANEL
Anion gap: 9 (ref 5–15)
BUN: 23 mg/dL (ref 8–23)
CO2: 20 mmol/L — ABNORMAL LOW (ref 22–32)
Calcium: 8.9 mg/dL (ref 8.9–10.3)
Chloride: 89 mmol/L — ABNORMAL LOW (ref 98–111)
Creatinine, Ser: 0.52 mg/dL (ref 0.44–1.00)
GFR, Estimated: >60 mL/min (ref >60)
Glucose, Bld: 97 mg/dL (ref 70–99)
Potassium: 4.1 mmol/L (ref 3.5–5.1)
Sodium: 118 mmol/L — CL (ref 135–145)

## 2020-11-04 LAB — RESP PANEL BY RT-PCR (FLU A&B, COVID) ARPGX2
Influenza A by PCR: NEGATIVE
Influenza B by PCR: NEGATIVE
SARS Coronavirus 2 by RT PCR: NEGATIVE

## 2020-11-04 LAB — GLUCOSE, CAPILLARY
Glucose-Capillary: 109 mg/dL — ABNORMAL HIGH (ref 70–99)
Glucose-Capillary: 122 mg/dL — ABNORMAL HIGH (ref 70–99)
Glucose-Capillary: 135 mg/dL — ABNORMAL HIGH (ref 70–99)
Glucose-Capillary: 88 mg/dL (ref 70–99)
Glucose-Capillary: 92 mg/dL (ref 70–99)
Glucose-Capillary: 98 mg/dL (ref 70–99)

## 2020-11-04 LAB — MRSA PCR SCREENING: MRSA by PCR: NEGATIVE

## 2020-11-04 LAB — SODIUM
Sodium: 121 mmol/L — ABNORMAL LOW (ref 135–145)
Sodium: 121 mmol/L — ABNORMAL LOW (ref 135–145)
Sodium: 121 mmol/L — ABNORMAL LOW (ref 135–145)
Sodium: 121 mmol/L — ABNORMAL LOW (ref 135–145)
Sodium: 121 mmol/L — ABNORMAL LOW (ref 135–145)

## 2020-11-04 LAB — OSMOLALITY, URINE: Osmolality, Ur: 108 mOsm/kg — ABNORMAL LOW (ref 300–900)

## 2020-11-04 LAB — LACTIC ACID, PLASMA
Lactic Acid, Venous: 0.8 mmol/L (ref 0.5–1.9)
Lactic Acid, Venous: 0.9 mmol/L (ref 0.5–1.9)
Lactic Acid, Venous: 0.8 mmol/L (ref 0.5–1.9)
Lactic Acid, Venous: 1.1 mmol/L (ref 0.5–1.9)

## 2020-11-04 LAB — SODIUM, URINE, RANDOM: Sodium, Ur: <10 mmol/L

## 2020-11-04 LAB — CREATININE, URINE, RANDOM: Creatinine, Urine: 14.95 mg/dL

## 2020-11-04 MED ORDER — SODIUM CHLORIDE 0.9 % IV BOLUS
250.0000 mL | Freq: Once | INTRAVENOUS | Status: AC
  Administered 2020-11-04: 250 mL via INTRAVENOUS

## 2020-11-04 MED ORDER — SODIUM CHLORIDE 0.9 % IV BOLUS
500.0000 mL | Freq: Once | INTRAVENOUS | Status: AC
  Administered 2020-11-04: 500 mL via INTRAVENOUS

## 2020-11-04 MED ORDER — SODIUM CHLORIDE 0.9 % IV SOLN
2.0000 g | INTRAVENOUS | Status: AC
  Administered 2020-11-04 – 2020-11-10 (×7): 2 g via INTRAVENOUS
  Filled 2020-11-04 (×7): qty 2

## 2020-11-04 MED ORDER — DEXTROSE 5 % IV SOLN: INTRAVENOUS | Status: DC

## 2020-11-04 MED ORDER — ORAL CARE MOUTH RINSE
15.0000 mL | Freq: Two times a day (BID) | OROMUCOSAL | Status: DC
  Administered 2020-11-05 – 2020-11-12 (×12): 15 mL via OROMUCOSAL

## 2020-11-04 MED ORDER — OSMOLITE 1.5 CAL PO LIQD
1000.0000 mL | ORAL | Status: DC
  Administered 2020-11-04 – 2020-11-12 (×6): 1000 mL
  Filled 2020-11-04 (×12): qty 1000

## 2020-11-04 MED ORDER — PROSOURCE TF PO LIQD
45.0000 mL | Freq: Every day | ORAL | Status: DC
  Administered 2020-11-04 – 2020-11-12 (×7): 45 mL
  Filled 2020-11-04 (×9): qty 45

## 2020-11-04 MED ORDER — CHLORHEXIDINE GLUCONATE CLOTH 2 % EX PADS
6.0000 | MEDICATED_PAD | Freq: Every day | CUTANEOUS | Status: DC
  Administered 2020-11-04 – 2020-11-12 (×7): 6 via TOPICAL

## 2020-11-04 MED ORDER — CHLORHEXIDINE GLUCONATE 0.12 % MT SOLN
15.0000 mL | Freq: Two times a day (BID) | OROMUCOSAL | Status: DC
  Administered 2020-11-04 – 2020-11-12 (×12): 15 mL via OROMUCOSAL
  Filled 2020-11-04 (×8): qty 15

## 2020-11-04 NOTE — Progress Notes (Signed)
Initial Nutrition Assessment  DOCUMENTATION CODES:   Severe malnutrition in context of chronic illness  INTERVENTION:   Initiate tube feeds via G-tube: - Start Osmolite 1.5 @ 20 ml/hr and advance by 10 ml q 4 hours to goal rate of 50 ml/hr (1200 ml/day) - ProSource TF 45 ml daily  Tube feeding regimen at goal provides 1840 kcal, 86 grams of protein, and 914 ml of H2O.   NUTRITION DIAGNOSIS:   Severe Malnutrition related to chronic illness (chronic dysphagia) as evidenced by severe fat depletion,severe muscle depletion.  GOAL:   Patient will meet greater than or equal to 90% of their needs  MONITOR:   Diet advancement,Labs,Weight trends,TF tolerance,Skin  REASON FOR ASSESSMENT:   Consult Enteral/tube feeding initiation and management  ASSESSMENT:   87 year old female who presented to the ED on 2/24 with vomiting. PMH of stroke with residual left-sided weakness, chronic dysphagia s/p G-tube, HTN, chronic urinary retention s/p Foley catheter, hypothyroidism. Pt admitted with symptomatic hyponatremia with acute metabolic encephalopathy.   2/24 - transferred to ICU, hypertonic saline ordered  Discussed pt with RN and during ICU rounds. Consult received for tube feeding initiation and management. Pt with G-tube in place.  Unable to obtain diet and weight history at this time. Per review of notes, pt typically eats but is not able to consume enough liquids via PO route so uses G-tube for free water flushes. Over the last week, pt has been experiencing nausea and vomiting with eating so pt's son has been administering Osmolite via G-tube for nutrition.  Discussed plan with RN to start tube feeds at 20 ml/hr and advance to goal rate of 50 ml/hr.  Reviewed weight history in chart. Last available weight PTA is from 12/09/17. Pt has experienced weight loss since that time but difficult to determine whether weight loss has been more acute in nature. Pt meets criteria for severe  malnutrition based on NFPE.  Medications reviewed and include: Oscal with D, folic acid IVF: D5 @ 75 ml/hr, NS bolus  Labs reviewed: sodium 121 CBG's: 92-107 x 24 hours  UOP: 1800 ml x 24 hours I/O's: -143 ml x 24 hours  NUTRITION - FOCUSED PHYSICAL EXAM:  Flowsheet Row Most Recent Value  Orbital Region Severe depletion  Upper Arm Region Moderate depletion  Thoracic and Lumbar Region Moderate depletion  Buccal Region Severe depletion  Temple Region Severe depletion  Clavicle Bone Region Severe depletion  Clavicle and Acromion Bone Region Severe depletion  Scapular Bone Region Moderate depletion  Dorsal Hand Moderate depletion  Patellar Region Moderate depletion  Anterior Thigh Region Moderate depletion  Posterior Calf Region Moderate depletion  Edema (RD Assessment) None  Hair Reviewed  Eyes Reviewed  Mouth Reviewed  Skin Reviewed  Nails Reviewed       Diet Order:   Diet Order            Diet NPO time specified  Diet effective now                 EDUCATION NEEDS:   No education needs have been identified at this time  Skin:  Skin Assessment: Skin Integrity Issues: Stage II: right buttocks, left buttocks  Last BM:  11/04/20 type 7  Height:   Ht Readings from Last 1 Encounters:  11/03/20 5\' 7"  (1.702 m)    Weight:   Wt Readings from Last 1 Encounters:  11/03/20 83 kg    BMI:  Body mass index is 28.66 kg/m.  Estimated Nutritional Needs:  Kcal:  1650-1850  Protein:  80-95 grams  Fluid:  1.6-1.8 L    Gustavus Bryant, MS, RD, LDN Inpatient Clinical Dietitian Please see AMiON for contact information.

## 2020-11-04 NOTE — Progress Notes (Signed)
Pharmacy Antibiotic Note  [name] is a 87 y.o. female admitted on 11/03/2020 with symptomatic hyponatremia. Pharmacy has been consulted for cefepime dosing due to GNRs in urine. No other signs of infection.  Start cefepime 2g q12hr per CrCl ~58 ml/min. Follow up need for antimicrobials and renal function adjustments as needed.  Plan: Cefepime 2g q12hr  Height: 5\' 7"  (170.2 cm) Weight: 83 kg (183 lb) IBW/kg (Calculated) : 61.6  Temp (24hrs), Avg:96.8 F (36 C), Min:96 F (35.6 C), Max:97.6 F (36.4 C)  Recent Labs  Lab 11/03/20 1139 11/03/20 1150 11/03/20 1150 11/03/20 1415 11/03/20 1438 11/03/20 1908 11/03/20 2131 11/04/20 0451  WBC  --  5.3  --   --   --   --   --   --   CREATININE  --  0.62   < > 0.58 0.70 0.54 0.59 0.52  LATICACIDVEN 1.0  --   --   --   --   --   --   --    < > = values in this interval not displayed.    Estimated Creatinine Clearance: 58 mL/min (by C-G formula based on SCr of 0.52 mg/dL).    Allergies  Allergen Reactions  . Aspirin Other (See Comments)    Causes blood in urine   Thank you for allowing pharmacy to be a part of this patient's care.  Esmond Plants 11/04/2020 11:41 AM

## 2020-11-04 NOTE — Evaluation (Signed)
Clinical/Bedside Swallow Evaluation Patient Details  Name: [name] MRN: [id number] Date of Birth: April 07, 1934  Today's Date: 11/04/2020 Time: SLP Start Time (ACUTE ONLY): 1118 SLP Stop Time (ACUTE ONLY): 1137 SLP Time Calculation (min) (ACUTE ONLY): 19 min  Past Medical History:  Past Medical History:  Diagnosis Date  . Anemia   . Arthritis    "was in my knees"  . High cholesterol   . History of blood transfusion    "S/P knee OR"  . Hypertension   . Hypothyroidism   . Pneumonia    "maybe in my teens"  . Recurrent UTI (urinary tract infection)   . Stroke [facility]) 1990's   "mild", denies residual on 01/28/2014   Past Surgical History:  Past Surgical History:  Procedure Laterality Date  . BLADDER SUSPENSION  2012  . EMBOLIZATION N/A 01/28/2014   Procedure: EMBOLIZATION;  Surgeon: Fransisco Hertz, MD;  Location: [facility] CATH LAB;  Service: Cardiovascular;  Laterality: N/A;  Splenic Artery  . FEMUR IM NAIL Right 03/23/2014   Procedure: INTRAMEDULLARY (IM) NAIL FEMORAL;  Surgeon: Shelda Pal, MD;  Location: [facility] ORS;  Service: Orthopedics;  Laterality: Right;  . JOINT REPLACEMENT    . MUSCLE BIOPSY Left 1990's   "knot biopsy from carrying mail bag for years"  . REVISON OF ARTERIOVENOUS FISTULA Right 01/31/2014   Procedure: REPAIR PSEUDOANEURYSM;  Surgeon: Nada Libman, MD;  [facility] OR;  Service: Vascular;  Laterality: Right;  Repair of Femoral Pseudoaneurysm.  . SPLENIC ARTERY EMBOLIZATION  01/28/2014  . TOTAL KNEE ARTHROPLASTY Bilateral 2012  . VAGINAL HYSTERECTOMY    . VISCERAL ANGIOGRAM N/A 01/28/2014   Procedure: VISCERAL ANGIOGRAM;  Surgeon: Fransisco Hertz, MD;  Location: [facility] C[facility] LAB;  Service: Cardiovascular;  Laterality: N/A;   HPI:  Pt is an 87 yo female presenting with AMS and recent vomiting. Pt was evaluated for swallowing in 2017 with mild oral weakness for which she compensated with Mod I. Regular solids and thin liquids were recommended. In 2019 she was seen at OSH  with more severe oropharyngeal dysphagia and NPO was recommended.  PMH also includes: CVA with residual L-sided weakness, chronic dysphagia s/p G-tube (able to eat solid food but not enough liquids per MD H&P note), HTN, chronic urinary retention s/p foley catheter, hypothyroidism   Assessment / Plan / Recommendation Clinical Impression  Pt is drowsy throughout PO trials, with near continuous loss of saliva out of the L side of her mouth, of which she verbalizes awareness when asked but makes no attempts to self-correct. Pt makes minimal verbalizations throughout testing, but she does show appropriate awareness of boluses upon presentation. She accepts and swiftly begins to initiate oral preparation, but she does lose thin liquids out the L side of her mouth quite easily. Transit with purees is much more prolonged. Although it is not entirely clear what diet she was on at baseline, her CXR was clear upon admission, suggestive of adequate tolerance. When considering this and her clinical presentation today, suspect that she may have some good potential to resume a PO diet pending more improvements in her mentation and level of alertness. With her current mental status, would remain NPO with use of her G-tube for meds/nutrition as needed.   SLP Visit Diagnosis: Dysphagia, unspecified (R13.10)    Aspiration Risk  Moderate aspiration risk    Diet Recommendation NPO;Alternative means - long-term (pt already has PEG)   Medication Administration: Via alternative means    Other  Recommendations Oral  Care Recommendations: Oral care QID Other Recommendations: Have oral suction available   Follow up Recommendations  (tba)      Frequency and Duration min 2x/week  2 weeks       Prognosis Prognosis for Safe Diet Advancement: Good      Swallow Study   General HPI: Pt is an 87 yo female presenting with AMS and recent vomiting. Pt was evaluated for swallowing in 2017 with mild oral weakness for which  she compensated with Mod I. Regular solids and thin liquids were recommended. In 2019 she was seen at OSH with more severe oropharyngeal dysphagia and NPO was recommended.  PMH also includes: CVA with residual L-sided weakness, chronic dysphagia s/p G-tube (able to eat solid food but not enough liquids per MD H&P note), HTN, chronic urinary retention s/p foley catheter, hypothyroidism Type of Study: Bedside Swallow Evaluation Previous Swallow Assessment: see HPI Diet Prior to this Study: NPO;PEG tube Temperature Spikes Noted: No History of Recent Intubation: No Behavior/Cognition: Lethargic/Drowsy;Cooperative;Requires cueing Oral Cavity Assessment: Excessive secretions Oral Care Completed by SLP: Yes Oral Cavity - Dentition: Adequate natural dentition Self-Feeding Abilities: Total assist Patient Positioning: Upright in bed Baseline Vocal Quality: Low vocal intensity Volitional Swallow: Unable to elicit    Oral/Motor/Sensory Function Overall Oral Motor/Sensory Function:  (not following commands to assess, but not containing saliva in her mouth on the L)   Ice Chips Ice chips: Impaired Presentation: Spoon Oral Phase Impairments: Reduced labial seal   Thin Liquid Thin Liquid: Impaired Presentation: Spoon Oral Phase Impairments: Reduced labial seal Oral Phase Functional Implications: Left anterior spillage    Nectar Thick Nectar Thick Liquid: Not tested   Honey Thick Honey Thick Liquid: Not tested   Puree Puree: Impaired Presentation: Spoon Oral Phase Impairments: Reduced labial seal; Prolonged oral transit  Solid     Solid: Not tested      Mahala Menghini., M.A. CCC-SLP Acute Rehabilitation Services Pager [phone] Office [phone]  11/04/2020,2:31 PM

## 2020-11-04 NOTE — Consult Note (Signed)
Palliative Medicine Inpatient Consult Note  Reason for consult:  Goals of Care  HPI:  Per intake H&P --> [name] a 87 y.o.femalewith history of HTN, stroke with residual left-sided weakness, gastric feeding tube, chronic urinary retention on indwelling Foley catheter, hypothyroidism who was presenteddue tovomiting, decreased oral intake and increased coughing,presented with a mental status change.  Palliative care has been consulted for goals of care given [name]'s chronically debilitated state.   Clinical Assessment/Goals of Care:  *Please note that this is a verbal dictation therefore any spelling or grammatical errors are due to the "[place] One" system interpretation.  I have reviewed medical records including EPIC notes, labs and imaging, received report from bedside RN, assessed the patient who was noted to be drooling from the left side of her mouth and oriented to self.    I called patients son, [name] to further discuss diagnosis prognosis, GOC, EOL wishes, disposition and options.   I introduced Palliative Medicine as specialized medical care for people living with serious illness. It focuses on providing relief from the symptoms and stress of a serious illness. The goal is to improve quality of life for both the patient and the family.  I asked [name] to tell me about his mother. He shares with me that she is from [name], [place]. She is a widow, her late husband died in 12-24-2010. She has two sons. She use to work for [employer]. She loves to read and watch game shows. She is faithful and practices within the [employer] denomination.  Prior to admission [name] had been living at home with her son, [name]. She is reliant on him for all bADL's with the exception of feeding herself. He states that she typically has a very robust appetite despite having a G-tube.   [name] has two additional daytime caregivers, [name] and [name]. [name] takes  the night shift. He shares that his mother typically is awake during the evening hours and sleeps "all day" for the most part. He states that [name] is in her bed and wheelchair. She is no longer ambulatory on her own.   [name] shares with me that he came to be [name]'s caregiver after she suffered from a second stroke. He then moved in to help her. He has been her caregiver for the past two and a half years or so.   A detailed discussion was had today regarding advanced directives.  Concepts specific to code status, artifical feeding and hydration, continued IV antibiotics and rehospitalization was had. [name] endorses that his mother "would want all things done, but not to be hooked up to machines". I told [name] that "all things done" unfortunately typically means she would be placed on machines. We reviewed her PMH and the poor likelihood of any meaningful recovery given her already impaired baseline function if she were to undergo a full resuscitation effort. [name] understood this but requested some time to speak to his brother about it. We reviewed that back in 12/23/13 at [facility] she was a DNAR. I strongly recommended consideration of this again.   [name] tells me that he was told his mother has been moved to the ICU and is on "stong antibiotics". We reviewed the symptoms that brought her into the hospital inclusive of her nausea and vomiting.   Discussed the importance of continued conversation with family and their  medical providers regarding overall plan of care and treatment options, ensuring decisions are within the context of the patients values and  GOCs.  Decision Maker: [name] (son) [phone]  SUMMARY OF RECOMMENDATIONS   Full Code / Full Scope of care --> Strongly recommended to patients son to consider DNR as she was this years ago when receiving care at [facility]. Patients son would like to speak to his brother prior to making any additional decisions  Will try to  complete a MOST prior to patient discharge  Will request OP Palliative support  Ongoing PMT support  Code Status/Advance Care Planning: FULL CODE  Palliative Prophylaxis:   Oral Care, Turn Q2H, Delirium Precuations  Additional Recommendations (Limitations, Scope, Preferences):  Continue current scope of care   Psycho-social/Spiritual:   Desire for further Chaplaincy support: Yes  Additional Recommendations: Education on chronic illnesess   Prognosis: Overall [name] has a very poor functioning baseline with multiple comorbid conditions. She is identified as living with severe frailty. She is at very high risk for increased 12 month mortality.   Discharge Planning: Discharge will be to home with OP palliative support  Vitals:   11/04/20 1153 11/04/20 1200  BP:  (!) 110/58  Pulse: 62 64  Resp: 13 18  Temp:    SpO2: 95% 96%    Intake/Output Summary (Last 24 hours) at 11/04/2020 1216 Last data filed at 11/04/2020 1200 Gross per 24 hour  Intake 2425.95 ml  Output 1975 ml  Net 450.95 ml   Last Weight  Most recent update: 11/03/2020  5:30 PM   Weight  83 kg (183 lb)           Gen:  Frail Elderly Caucasian F  HEENT: moist mucous membranes CV: Regular rate and rhythm  PULM: On RA ABD: soft/nontender  EXT: No edema  Neuro: Alert to self  PPS: 10%   This conversation/these recommendations were discussed with patient primary care team, Dr. Lupita Leash  Time In: 1330 Time Out: 1440 Total Time: 70 Greater than 50%  of this time was spent counseling and coordinating care related to the above assessment and plan.  [facility] Team Team Cell Phone: [phone] Please utilize secure chat with additional questions, if there is no response within 30 minutes please call the above phone number  Palliative Medicine Team providers are available by phone from 7am to 7pm daily and can be reached through the team cell phone.  Should this  patient require assistance outside of these hours, please call the patient's attending physician.

## 2020-11-04 NOTE — Progress Notes (Signed)
[facility] Progress Note  Name: [name] MRN: [id number] DOB: 09-01-1934  Subjective:  she has been on 3%.  Sodium was 113 then 3% was started per RN and next check was 118.   Review of systems:  Unable to obtain 2/2 ams   --------------- Background on consult:  [name] is a 87 y.o. female with history of HTN, stroke with residual left-sided weakness, gastric feeding tube, chronic urinary retention on indwelling Foley catheter, hypothyroidism who was presented due to vomiting, decreased oral intake and increased coughing, seen as a consultation for the evaluation of hyponatremia. Patient is bedridden at baseline with cognitive deficit.  She is alert awake and able to communicate simple thing with families at baseline.  Also able to eat but for last few days he is becoming more lethargic therefore her son was feeding through the gastric tube with Osmolite and water.  She was diagnosed with a UTI last week therefore prescribed Macrobid by her PCP.  In the ER, she was initially hypotensive to systolic BP 90s.  The labs showed sodium 110, potassium 5.8, creatinine 0.62.  She received 1 L of LR with repeat labs showed a sodium of 139 potassium 5.6.  We are consulted to evaluate hyponatremia. She is currently alert awake and following commands.  As per her son, patient's mental status is not far from her baseline.  She was alert awake and following simple commands to me.  She was not able to provide review of system or history to me. Home medication include lisinopril, Synthroid, labetalol, statin.  No diuretics noted.   Intake/Output Summary (Last 24 hours) at 11/04/2020 0709 Last data filed at 11/04/2020 0600 Gross per 24 hour  Intake 1656.29 ml  Output 1800 ml  Net -143.71 ml    Vitals:  Vitals:   11/04/20 0344 11/04/20 0400 11/04/20 0500 11/04/20 0600  BP:  101/78 (!) 88/59 121/66  Pulse:  60 (!) 53 63  Resp:  14 10 20   Temp: (!) 96.1 F (35.6 C)      TempSrc: Axillary     SpO2:  97% 94% 95%  Weight:      Height:         Physical Exam:  General adult female in bed chronically ill  HEENT normocephalic atraumatic Neck normal circumference trachea midline Lungs clear to auscultation bilaterally normal work of breathing at rest  Heart S1S2 no rub Abdomen soft nontender nondistended Extremities no edema  Psych calm mood and affect Neuro follows simple motor command - blinking eyes hard and shaking head  Medications reviewed   Labs:  BMP Latest Ref Rng & Units 11/04/2020 11/03/2020 11/03/2020  Glucose 70 - 99 mg/dL 97 409(W) -  BUN 8 - 23 mg/dL 23 11(B) -  Creatinine 0.44 - 1.00 mg/dL 1.47 8.29 -  Sodium 562 - 145 mmol/L 118(LL) 113(LL) 113(LL)  Potassium 3.5 - 5.1 mmol/L 4.1 5.3(H) -  Chloride 98 - 111 mmol/L 89(L) 86(L) -  CO2 22 - 32 mmol/L 20(L) 19(L) -  Calcium 8.9 - 10.3 mg/dL 8.9 8.9 -     Assessment/Plan:   #Subacute hyponatremia, mild hypovolemic: Due to GI loss and possibly low solute intake.  Urine osm 108. Urine Na < 10. TSH ok.  - Spoke with RN and asked her to contact me with next sodium which we are sending stat.   - Stop 3% for now  - continue sodium checks and neuro checks  #Hyperkalemia: Due to lisinopril and some  dehydration. improved  #Recent urinary tract infection and chronic retention requiring indwelling Foley catheter: Reportedly received Macrobid by her PCP.  Follow-up culture results.  #Hypertension: Hypotensive on arrival.  BP improved after IV fluid.  Hold lisinopril. 92/66 on exam   #Stroke and left-sided paresis: Bedbound at baseline.   Tammy Emms, MD 11/04/2020 7:25 AM

## 2020-11-04 NOTE — Progress Notes (Addendum)
PROGRESS NOTE    [name]  [name]:[id number] DOB: [birth year] DOA: 11/03/2020 PCP: Tammy Fusi, MD   Chief Complaint  Patient presents with  . Cough   Brief Narrative: 87 year old female with history of stroke with left-sided weakness on Eliquis, chronic dysphasia with G-tube in place, hypertension, chronic urine retention status post Foley catheter since 2021 November, hypothyroidism, chronic debility patient able to communicate with family members uses [place] back to move around able to eat solid food but not enough liquid which is used mostly via G-tube ( 60 OZ water daily) As per report  Patient began to have symptoms since last Friday, patient started to have frequent vomiting every time after eating, with stomach content and sometimes streak of blood and blood for 1 day.  Saturday symptoms resolved, but patient appeared to be more lethargic since, and po intake decreased significantly.  Son started tube feeding over the weekend with Osmolite.  Also developed periodic confusions, no fever, no abd pain and no diarrhea.  Patient has had chronic urinary retention and a Foley catheter was placed in 4 months ago with Foley exchanged every 3 days.  Since then patient has developed multiple episodes of UTIs.  Most recently patient developed another UTI last Thursday and was treated with 5 days of Macrobid and the last dose was this Monday.  In the ED,CT head negative, severely low sodium 110 blood pressure borderline low chest x-ray clear UA UTI versus colonization.  Patient admitted and nephrology was consulted.sodium <10,TSH okay osmol 108, patient was transferred to ICU on hypertonic saline. Overnight 2/24  sodium up trended 118- and 3% stopped.  Subjective: Seen/examined Drowsy- but woke up on calling her, nods for response. Did not communicate anything contractures noted in extremities. diarrhea overnight type 7, flexiseal in palce but empty now  Assessment &  Plan:  Subacute hyponatremia, with mild hypovolemia due to GI loss and possibly low solute intake:  Urine sod <10,TSH okay osmol 108, patient was transferred to ICU on hypertonic saline. Overnight 2/24  sodium up trended 118- and 3% stopped.  Checking serial sodium and neuro checks as per nephrology.  Defer further management to nephrology. Recent Labs  Lab 11/03/20 2131 11/04/20 0451 11/04/20 0738 11/04/20 0957 11/04/20 1150  NA 113*  113* 118* 121* 121* 121*   Drowsy/weak frail deconditioned, suspect acute metabolic encephalopathy in the setting of UTI hypotension keep on supportive measures fall precaution.  Hypotension:This morning blood pressure in 80s given hyponatremia/fluid issues deferred to nephro for ivf- discussed w/ Dr [name] given 250 mL bolus NS- BP still down in 78s ( although came up in 110 briefly)- discussed Dr. Malen Woodard and critical care Dr. Patriciaann Woodard trying more crystalloids and ordered 500 ml NS bolus.  Will need to monitor blood pressure closely if patient needs pressors ( if unable to get more normal saline, ?albumin) will inform Dr. Henrene Woodard to take over for pressors as I am concerned about sepsis as patient's urine culture came back more than 100,000 gram-negative rods, temo on lwoer side upto 35.6- so starting cefepime, check lactic acid again. Lactic acid stable.  Blood pressure has improved to 90s to 100. Monitor lactic acid. Recent Labs  Lab 11/03/20 1139 11/03/20 1150 11/04/20 1150  WBC  --  5.3  --   LATICACIDVEN 1.0  --  0.8   Hypertension soft on arrival, improved with fluids but this am BP in 80-90s- Holding lisinopril-  Diarrhea overnight- type7 stool pt with lo bp, low temp check C  DIFF  Hyperkalemia due to lisinopril dehydration, status post Lokelma.  Improved. Non-anion gap metabolic acidosis bicarbonate 20.  Monitor  Chronic Foley for chronic retention/recently completed Macrobid for UTI: Foley was exchanged last Monday.  UA grossly abnormal  in ED. Urine cx +  Stroke with left-sided weakness on Eliquis Drowsy-not communicative, responding with head nods. Closely monitor CT head   Chronic dysphagia with G-tube in place, dietitian consulted. Consult SLP as well, NPO since admission  Hypothyroidism: Continue Synthroid  Anemia likely from chronic disease follow-up anemia panel iron study Recent Labs  Lab 11/03/20 1150 11/03/20 1438  HGB 10.5* 8.8*  HCT 29.9* 26.0*   Heart murmur- needs outpatient echocardiogram.  Chronic debility with multiple complex comorbidities.  Pressure ulcer see below  severe protein calorie malnutrition Nutrition Problem: Severe Malnutrition Etiology: chronic illness (chronic dysphagia) Signs/Symptoms: severe fat depletion,severe muscle depletion Interventions: Tube feeding,Prostat  Goals of care: Full code but patient is debilitated elderly multiple complex comorbidities at risk of acute decompensation.  Palliative care consulted, prognosis does not appear bright  Nutrition: Diet Order            Diet NPO time specified  Diet effective now               Patient's Body mass index is 28.66 kg/m.  Pressure Ulcer: Pressure Injury 11/03/20 Buttocks Right Stage 2 -  Partial thickness loss of dermis presenting as a shallow open injury with a red, pink wound bed without slough. (Active)  11/03/20 2330  Location: Buttocks  Location Orientation: Right  Staging: Stage 2 -  Partial thickness loss of dermis presenting as a shallow open injury with a red, pink wound bed without slough.  Wound Description (Comments):   Present on Admission: Yes     Pressure Injury 11/03/20 Buttocks Left Stage 2 -  Partial thickness loss of dermis presenting as a shallow open injury with a red, pink wound bed without slough. (Active)  11/03/20 2330  Location: Buttocks  Location Orientation: Left  Staging: Stage 2 -  Partial thickness loss of dermis presenting as a shallow open injury with a red, pink wound bed  without slough.  Wound Description (Comments):   Present on Admission: Yes    DVT prophylaxis: eliquis Code Status:   Code Status: Full Code Family Communication: plan of care discussed with Spoke  w/ RN at bedside, with critical care nephrology I called her Son's  No- no answer left message to call us back. Her son did call back he tells me patient is alert awake oriented and communicative at baseline. We explained that she does not look very good and will need to see how she does.  He understands overall clinical situation.  He is wondering if he can visit and is calling the nursing station.  Status is: Inpatient Remains inpatient appropriate because:Ongoing diagnostic testing needed not appropriate for outpatient work up, IV treatments appropriate due to intensity of illness or inability to take PO and Inpatient level of care appropriate due to severity of illness   Dispo: The patient is from: Home              Anticipated d/c is to: tbd              Patient currently is not medically stable to d/c.   Difficult to place patient No  Consultants:see note  Procedures:see note  Unresulted Labs (From admission, onward)          Start  Ordered   11/05/20 0500  CBC  Daily,   R     Question:  Specimen collection method  Answer:  Lab=Lab collect   11/04/20 0911   11/05/20 0500  Basic metabolic panel  Daily,   R     Question:  Specimen collection method  Answer:  Lab=Lab collect   11/04/20 0911   11/04/20 1400  Sodium  Now then every 2 hours,   R (with TIMED occurrences)     Question:  Specimen collection method  Answer:  Lab=Lab collect   11/04/20 1335   11/04/20 1145  Lactic acid, plasma  STAT Now then every 3 hours,   R (with STAT occurrences)     Question:  Specimen collection method  Answer:  Lab=Lab collect   11/04/20 1144   11/04/20 0912  C Difficile Quick Screen w PCR reflex  (C Difficile quick screen w PCR reflex panel)  Once, for 24 hours,   TIMED       References:     CDiff Information Tool   11/04/20 0911         Culture/Microbiology    Component Value Date/Time   SDES BLOOD FOOT 11/03/2020 1139   SDES IN/OUT CATH URINE 11/03/2020 1139   SPECREQUEST  11/03/2020 1139    BOTTLES DRAWN AEROBIC AND ANAEROBIC Blood Culture adequate volume   SPECREQUEST  11/03/2020 1139    NONE Performed at [facility], [address] [place], [place] [address]    CULT  11/03/2020 1139    NO GROWTH 1 DAY Performed at [facility], [address], [place], [place] [address]    CULT >=100,000 COLONIES/mL PSEUDOMONAS AERUGINOSA (A) 11/03/2020 1139   REPTSTATUS PENDING 11/03/2020 1139   REPTSTATUS PENDING 11/03/2020 1139    Other culture-see note  Medications: Scheduled Meds: . apixaban  5 mg Per Tube BID  . bethanechol  50 mg Per Tube BID  . calcium-vitamin D   Oral BID  . Chlorhexidine Gluconate Cloth  6 each Topical Daily  . clonazePAM  0.5 mg Per Tube QHS  . escitalopram  10 mg Per Tube Daily  . feeding supplement (PROSource TF)  45 mL Per Tube Daily  . folic acid  0.5 mg Per Tube Daily  . levothyroxine  50 mcg Per Tube QAC breakfast  . QUEtiapine  12.5 mg Per Tube QHS   Continuous Infusions: . ceFEPime (MAXIPIME) IV    . dextrose 75 mL/hr at 11/04/20 1123  . feeding supplement (OSMOLITE 1.5 CAL)      Antimicrobials: Anti-infectives (From admission, onward)   Start     Dose/Rate Route Frequency Ordered Stop   11/04/20 1300  ceFEPIme (MAXIPIME) 2 g in sodium chloride 0.9 % 100 mL IVPB        2 g 200 mL/hr over 30 Minutes Intravenous Every 24 hours 11/04/20 1138       Objective: Vitals: Today's Vitals   11/04/20 1153 11/04/20 1200 11/04/20 1215 11/04/20 1230  BP:  (!) 110/58 (!) 88/57 (!) 98/57  Pulse: 62 64 61 61  Resp: 13 18 14 14   Temp:      TempSrc:      SpO2: 95% 96% 96% 96%  Weight:      Height:      PainSc:        Intake/Output Summary (Last 24 hours) at 11/04/2020 1348 Last data filed at 11/04/2020 1200 Gross per  24 hour  Intake 1425.95 ml  Output 1975 ml  Net -549.05 ml   Filed Weights   11/03/20 1729  Weight: 83 kg   Weight change:   Intake/Output from previous day: 02/24 0701 - 02/25 0700 In: 1656.3 [I.V.:656.3; IV Piggyback:1000] Out: 1800 [Urine:1800] Intake/Output this shift: Total I/O In: 769.7 [I.V.:45.4; IV Piggyback:724.3] Out: 175 [Urine:175] Filed Weights   11/03/20 1729  Weight: 83 kg    Examination: General exam: Alert, not communicative weak, frail old for age. Debilitated HEENT:Oral mucosa moist, Ear/Nose WNL grossly,dentition normal. Respiratory system: bilaterally clear,no wheezing or crackles,no use of accessory muscle, non tender. Cardiovascular system: S1 & S2 +, regular, No JVD. Gastrointestinal system: Abdomen soft, PEG+ NT,ND, BS+. Nervous System:Alert, awake, moving extremities and grossly nonfocal Extremities: contractures present on LT UE- No edema, distal peripheral pulses palpable.  Skin: No rashes,no icterus. MSK: Normal muscle bulk,tone, power   Data Reviewed: I have personally reviewed following labs and imaging studies CBC: Recent Labs  Lab 11/03/20 1150 11/03/20 1438  WBC 5.3  --   NEUTROABS 4.4  --   HGB 10.5* 8.8*  HCT 29.9* 26.0*  MCV 88.5  --   PLT 122*  --    Basic Metabolic Panel: Recent Labs  Lab 11/03/20 1150 11/03/20 1415 11/03/20 1438 11/03/20 1908 11/03/20 2131 11/04/20 0451 11/04/20 0738 11/04/20 0957 11/04/20 1150  NA 110* 111* 113* 113* 113*  113* 118* 121* 121* 121*  K 5.8* 5.6* 5.6* 5.8* 5.3* 4.1  --   --   --   CL 84* 84* 83* 87* 86* 89*  --   --   --   CO2 18* 20*  --  17* 19* 20*  --   --   --   GLUCOSE 107* 124* 126* 116* 109* 97  --   --   --   BUN 30* 26* 26* 27* 26* 23  --   --   --   CREATININE 0.62 0.58 0.70 0.54 0.59 0.52  --   --   --   CALCIUM 8.8* 8.6*  --  9.1 8.9 8.9  --   --   --   PHOS  --  3.4  --  3.3  --   --   --   --   --    GFR: Estimated Creatinine Clearance: 58 mL/min (by C-G  formula based on SCr of 0.52 mg/dL). Liver Function Tests: Recent Labs  Lab 11/03/20 1150 11/03/20 1415 11/03/20 1908  AST 18  --   --   ALT 10  --   --   ALKPHOS 70  --   --   BILITOT 1.2  --   --   PROT 5.3*  --   --   ALBUMIN 3.0* 2.7* 2.9*   Recent Labs  Lab 11/03/20 1150  LIPASE 40   No results for input(s): AMMONIA in the last 168 hours. Coagulation Profile: Recent Labs  Lab 11/03/20 1150  INR 1.5*   Cardiac Enzymes: No results for input(s): CKTOTAL, CKMB, CKMBINDEX, TROPONINI in the last 168 hours. BNP (last 3 results) No results for input(s): PROBNP in the last 8760 hours. HbA1C: No results for input(s): HGBA1C in the last 72 hours. CBG: Recent Labs  Lab 11/03/20 2312 11/04/20 0338 11/04/20 0758 11/04/20 1107  GLUCAP 107* 92 98 88   Lipid Profile: Recent Labs    11/03/20 1908  CHOL 146  HDL 26*  LDLCALC 103*  TRIG 86  CHOLHDL 5.6   Thyroid Function Tests: Recent Labs    11/03/20 1908  TSH  1.414   Anemia Panel: Recent Labs    11/03/20 1908  FERRITIN 766*  TIBC 255  IRON 59   Sepsis Labs: Recent Labs  Lab 11/03/20 1139 11/04/20 1150  LATICACIDVEN 1.0 0.8    Recent Results (from the past 240 hour(s))  Blood culture (routine single)     Status: None (Preliminary result)   Collection Time: 11/03/20 11:39 AM   Specimen: BLOOD  Result Value Ref Range Status   Specimen Description BLOOD FOOT  Final   Special Requests   Final    BOTTLES DRAWN AEROBIC AND ANAEROBIC Blood Culture adequate volume   Culture   Final    NO GROWTH 1 DAY Performed at [facility], [address] [place], [place] [address]    Report Status PENDING  Incomplete  Urine culture     Status: Abnormal (Preliminary result)   Collection Time: 11/03/20 11:39 AM   Specimen: In/Out Cath Urine  Result Value Ref Range Status   Specimen Description IN/OUT CATH URINE  Final   Special Requests   Final    NONE Performed at [facility], [address], [place], [place] [address]    Culture >=100,000 COLONIES/mL PSEUDOMONAS AERUGINOSA (A)  Final   Report Status PENDING  Incomplete  Resp Panel by RT-PCR (Flu A&B, Covid) Urine, Catheterized     Status: None   Collection Time: 11/03/20 11:28 PM   Specimen: Urine, Catheterized; Nasopharyngeal(NP) swabs in vial transport medium  Result Value Ref Range Status   SARS Coronavirus 2 by RT PCR NEGATIVE NEGATIVE Final    Comment: (NOTE) SARS-CoV-2 target nucleic acids are NOT DETECTED.  The SARS-CoV-2 RNA is generally detectable in upper respiratory specimens during the acute phase of infection. The lowest concentration of SARS-CoV-2 viral copies this assay can detect is 138 copies/mL. A negative result does not preclude SARS-Cov-2 infection and should not be used as the sole basis for treatment or other patient management decisions. A negative result may occur with  improper specimen collection/handling, submission of specimen other than nasopharyngeal swab, presence of viral mutation(s) within the areas targeted by this assay, and inadequate number of viral copies(<138 copies/mL). A negative result must be combined with clinical observations, patient history, and epidemiological information. The expected result is Negative.  Fact Sheet for Patients:  BloggerCourse.com  Fact Sheet for Healthcare Providers:  SeriousBroker.it  This test is no t yet approved or cleared by the [place] FDA and  has been authorized for detection and/or diagnosis of SARS-CoV-2 by FDA under an Emergency Use Authorization (EUA). This EUA will remain  in effect (meaning this test can be used) for the duration of the COVID-19 declaration under Section 564(b)(1) of the Act, 21 U.S.C.section 360bbb-3(b)(1), unless the authorization is terminated  or revoked sooner.       Influenza A by PCR NEGATIVE NEGATIVE Final   Influenza B by PCR NEGATIVE NEGATIVE Final     Comment: (NOTE) The Xpert Xpress SARS-CoV-2/FLU/RSV plus assay is intended as an aid in the diagnosis of influenza from Nasopharyngeal swab specimens and should not be used as a sole basis for treatment. Nasal washings and aspirates are unacceptable for Xpert Xpress SARS-CoV-2/FLU/RSV testing.  Fact Sheet for Patients: BloggerCourse.com  Fact Sheet for Healthcare Providers: SeriousBroker.it  This test is not yet approved or cleared by the [place] FDA and has been authorized for detection and/or diagnosis of SARS-CoV-2 by FDA under an Emergency Use Authorization (EUA). This EUA will remain in effect (  meaning this test can be used) for the duration of the COVID-19 declaration under Section 564(b)(1) of the Act, 21 U.S.C. section 360bbb-3(b)(1), unless the authorization is terminated or revoked.  Performed at [facility], [address] [place], [place] [address]   MRSA PCR Screening     Status: None   Collection Time: 11/03/20 11:35 PM   Specimen: Urine, Catheterized; Nasopharyngeal  Result Value Ref Range Status   MRSA by PCR NEGATIVE NEGATIVE Final    Comment:        The GeneXpert MRSA Assay (FDA approved for NASAL specimens only), is one component of a comprehensive MRSA colonization surveillance program. It is not intended to diagnose MRSA infection nor to guide or monitor treatment for MRSA infections. Performed at [facility], [address], [place], [place] [address]      Radiology Studies: CT HEAD WO CONTRAST  Result Date: 11/03/2020 CLINICAL DATA:  Mental status change EXAM: CT HEAD WITHOUT CONTRAST TECHNIQUE: Contiguous axial images were obtained from the base of the skull through the vertex without intravenous contrast. COMPARISON:  MRIA 01/19/2016, CT brain 07/03/2018, 12/25/2017 FINDINGS: Brain: No acute territorial infarction, hemorrhage or intracranial mass. Large chronic right MCA  infarct. Small chronic infarct in the right cerebellum. Wallerian degeneration of the right mid brain and pons. Ex vacuo dilatation of the right lateral ventricle. Mild hypodensity in the white matter consistent with chronic small vessel ischemic change. Vascular: No hyperdense vessels. Vertebral and carotid vascular calcification. Skull: Normal. Negative for fracture or focal lesion. Sinuses/Orbits: No acute finding. Other: None IMPRESSION: 1. No CT evidence for acute intracranial abnormality. 2. Large chronic right MCA infarct. Small chronic infarct in the right cerebellum. 3. Mild chronic small vessel ischemic changes of the white matter. Electronically Signed   By: Jasmine Pang M.D.   On: 11/03/2020 15:43   DG Chest Port 1 View  Result Date: 11/03/2020 CLINICAL DATA:  Questionable sepsis. EXAM: PORTABLE CHEST 1 VIEW COMPARISON:  Chest x-ray 07/03/2018. FINDINGS: Mediastinum is stable. Heart size normal. No focal infiltrate. Biapical pleural-parenchymal thickening again noted consistent scarring. No pleural effusion or pneumothorax. Prominent skin fold on the right. Degenerative change thoracic spine. IMPRESSION: No acute cardiopulmonary disease. Electronically Signed   By: Maisie Fus  Register   On: 11/03/2020 12:09     LOS: 1 day   Lanae Boast, MD [facility]  11/04/2020, 1:48 PM

## 2020-11-05 DIAGNOSIS — E43 Unspecified severe protein-calorie malnutrition: Secondary | ICD-10-CM | POA: Diagnosis not present

## 2020-11-05 DIAGNOSIS — Z66 Do not resuscitate: Secondary | ICD-10-CM

## 2020-11-05 DIAGNOSIS — Z515 Encounter for palliative care: Secondary | ICD-10-CM | POA: Diagnosis not present

## 2020-11-05 LAB — BASIC METABOLIC PANEL
Anion gap: 7 (ref 5–15)
BUN: 16 mg/dL (ref 8–23)
CO2: 20 mmol/L — ABNORMAL LOW (ref 22–32)
Calcium: 8.8 mg/dL — ABNORMAL LOW (ref 8.9–10.3)
Chloride: 96 mmol/L — ABNORMAL LOW (ref 98–111)
Creatinine, Ser: 0.43 mg/dL — ABNORMAL LOW (ref 0.44–1.00)
GFR, Estimated: >60 mL/min (ref >60)
Glucose, Bld: 145 mg/dL — ABNORMAL HIGH (ref 70–99)
Potassium: 3.8 mmol/L (ref 3.5–5.1)
Sodium: 123 mmol/L — ABNORMAL LOW (ref 135–145)

## 2020-11-05 LAB — CBC
HCT: 24.8 % — ABNORMAL LOW (ref 36.0–46.0)
Hemoglobin: 8.8 g/dL — ABNORMAL LOW (ref 12.0–15.0)
MCH: 31.7 pg (ref 26.0–34.0)
MCHC: 35.5 g/dL (ref 30.0–36.0)
MCV: 89.2 fL (ref 80.0–100.0)
Platelets: 111 10*3/uL — ABNORMAL LOW (ref 150–400)
RBC: 2.78 MIL/uL — ABNORMAL LOW (ref 3.87–5.11)
RDW: 12.5 % (ref 11.5–15.5)
WBC: 3.9 10*3/uL — ABNORMAL LOW (ref 4.0–10.5)
nRBC: 0 % (ref 0.0–0.2)

## 2020-11-05 LAB — URINE CULTURE: Culture: >=100000 — AB

## 2020-11-05 LAB — SODIUM
Sodium: 123 mmol/L — ABNORMAL LOW (ref 135–145)
Sodium: 123 mmol/L — ABNORMAL LOW (ref 135–145)
Sodium: 126 mmol/L — ABNORMAL LOW (ref 135–145)
Sodium: 128 mmol/L — ABNORMAL LOW (ref 135–145)

## 2020-11-05 LAB — GLUCOSE, CAPILLARY
Glucose-Capillary: 129 mg/dL — ABNORMAL HIGH (ref 70–99)
Glucose-Capillary: 133 mg/dL — ABNORMAL HIGH (ref 70–99)

## 2020-11-05 MED ORDER — CALCIUM CARBONATE-VITAMIN D 500-200 MG-UNIT PO TABS
1.0000 | ORAL_TABLET | Freq: Two times a day (BID) | ORAL | Status: DC
  Administered 2020-11-05 – 2020-11-12 (×12): 1
  Filled 2020-11-05 (×11): qty 1

## 2020-11-05 MED ORDER — SODIUM CHLORIDE 0.9 % IV SOLN: INTRAVENOUS | Status: AC

## 2020-11-05 NOTE — Progress Notes (Signed)
Pt admitted to [facility] from [facility].  Pt is A&O to self only.  Vitals take and all within normal range. Pt placed on telemetry and CCMD notified.  CHG bath completed.  Pt is currently comfortable.and not in pain.  Pt's PEG tube appears to have mold or old food residue or deteriorating tubing.  Ramesh MD notified of PEG tube status.

## 2020-11-05 NOTE — Progress Notes (Signed)
[facility] Progress Note  Name: [name] MRN: [id number] DOB: 22-Dec-1933  Subjective:  sodium plateaued at 121-123.  had 1.4 liters UOP over 2/25. Spoke with her son on 2/25.  At baseline patient's son stated that she knows name and birthday.  She's able to tell me this this morning.  Review of systems:  Limited 2/2 ams but denies shortness of breath, chest pain, n/v  --------------- Background on consult:  DNIYA Woodard is a 87 y.o. female with history of HTN, stroke with residual left-sided weakness, gastric feeding tube, chronic urinary retention on indwelling Foley catheter, hypothyroidism who was presented due to vomiting, decreased oral intake and increased coughing, seen as a consultation for the evaluation of hyponatremia. Patient is bedridden at baseline with cognitive deficit.  She is alert awake and able to communicate simple thing with families at baseline.  Also able to eat but for last few days he is becoming more lethargic therefore her son was feeding through the gastric tube with Osmolite and water.  She was diagnosed with a UTI last week therefore prescribed Macrobid by her PCP.  In the ER, she was initially hypotensive to systolic BP 90s.  The labs showed sodium 110, potassium 5.8, creatinine 0.62.  She received 1 L of LR with repeat labs showed a sodium of 139 potassium 5.6.  We are consulted to evaluate hyponatremia. She is currently alert awake and following commands.  As per her son, patient's mental status is not far from her baseline.  She was alert awake and following simple commands to me.  She was not able to provide review of system or history to me. Home medication include lisinopril, Synthroid, labetalol, statin.  No diuretics noted.   Intake/Output Summary (Last 24 hours) at 11/05/2020 4098 Last data filed at 11/05/2020 0600 Gross per 24 hour  Intake 3998 ml  Output 1425 ml  Net 2573 ml    Vitals:  Vitals:   11/05/20 0430  11/05/20 0500 11/05/20 0530 11/05/20 0600  BP: 99/68 104/74 110/67 108/71  Pulse: 79 83 84 84  Resp: 13 14 13 14   Temp:      TempSrc:      SpO2: 97% 97% 96% 96%  Weight:      Height:         Physical Exam: General adult female in bed chronically ill  HEENT normocephalic atraumatic Neck normal circumference trachea midline Lungs clear to auscultation bilaterally normal work of breathing at rest  Heart S1S2 no rub Abdomen soft nontender nondistended Extremities no edema  Psych calm mood and affect Neuro tells me her name and her birthday; more conversant   Medications reviewed   Labs:  BMP Latest Ref Rng & Units 11/05/2020 11/05/2020 11/04/2020  Glucose 70 - 99 mg/dL 119(J) - -  BUN 8 - 23 mg/dL 16 - -  Creatinine 4.78 - 1.00 mg/dL 2.95(A) - -  Sodium 213 - 145 mmol/L 123(L) 123(L) 121(L)  Potassium 3.5 - 5.1 mmol/L 3.8 - -  Chloride 98 - 111 mmol/L 96(L) - -  CO2 22 - 32 mmol/L 20(L) - -  Calcium 8.9 - 10.3 mg/dL 0.8(M) - -     Assessment/Plan:   #Subacute hyponatremia, mild hypovolemic: Due to GI loss and possibly low solute intake and compounded by lexapro.  Urine osm 108. Urine Na < 10. TSH ok.  - Spoke with RN and asked her to contact me with next sodium which we are sending stat.   - transition  to normal saline at 100 ml/hr x 24 hours then reassess - stop every 4 hour Na checks and space to every 6  - please do not resume lexapro or black elderberry - both home meds per chart list  # Hyperkalemia:setting of lisinopril. Resolved.  Would not resume lisionpril  # Recent urinary tract infection and chronic retention requiring indwelling Foley catheter: Reportedly received Macrobid by her PCP.  pseudomonas. abx per primary. On cefepime.  # hypotension - improved with normal saline. Hold lisinopril  #Stroke and left-sided paresis: Bedbound at baseline.   Tammy Emms, MD 11/05/2020 6:56 AM

## 2020-11-05 NOTE — Progress Notes (Signed)
PROGRESS NOTE    [name]  VHQ:[id number] DOB: April 28, 1934 DOA: 11/03/2020 PCP: Paulina Fusi, MD   Chief Complaint  Patient presents with  . Cough   Brief Narrative: 87 year old female with history of stroke with left-sided weakness on Eliquis, chronic dysphasia with G-tube in place, hypertension, chronic urine retention status post Foley catheter since 2021 November, hypothyroidism, chronic debility patient able to communicate with family members uses [place] back to move around able to eat solid food but not enough liquid which is used mostly via G-tube ( 60 OZ water daily) As per report  Patient began to have symptoms since last Friday, patient started to have frequent vomiting every time after eating, with stomach content and sometimes streak of blood and blood for 1 day.  Saturday symptoms resolved, but patient appeared to be more lethargic since, and po intake decreased significantly.  Son started tube feeding over the weekend with Osmolite.  Also developed periodic confusions, no fever, no abd pain and no diarrhea.  Patient has had chronic urinary retention and a Foley catheter was placed in 4 months ago with Foley exchanged every 3 days.  Since then patient has developed multiple episodes of UTIs.  Most recently patient developed another UTI last Thursday and was treated with 5 days of Macrobid and the last dose was this Monday.  In the ED,CT head negative, severely low sodium 110 blood pressure borderline low chest x-ray clear UA UTI versus colonization.  Patient admitted and nephrology was consulted.sodium <10,TSH okay osmol 108, patient was transferred to ICU on hypertonic saline. Overnight 2/24  sodium up trended 118- and 3% stopped on 2/25. Patient urine cultures growing Pseudomonas placed on cefepime.  She had multiple episodes of hypotension managed with IV fluid boluses and also hypothermia.  Subjective:  Overnight sodium has trended up in 123. She had good  urine output 1.4l At baseline able to tell her name and DOB-which she is able to do this morning Off Baer-hugger and temperature has normalized. Able to nod for response Nursing reports patient has not had any diarrhea.  Assessment & Plan:  Subacute hyponatremia, with mild hypovolemia due to GI loss and possibly low solute intake:  Urine sod <10,TSH okay osmol 108, patient was transferred to ICU on hypertonic saline. OFF 3% SALINE 2/25 AM.  Received multiple boluses for hypotension.  Now transition to normal saline 1 mL/h x 24 hours with every 6 hours sodium checks per nephrology.  Patient is not to resume Lexapro or black elderberry per nephrology Recent Labs  Lab 11/04/20 1150 11/04/20 1440 11/04/20 1615 11/05/20 0116 11/05/20 0439  NA 121* 121* 121* 123* 123*   Drowsy/weak frail deconditioned, suspect acute metabolic encephalopathy patient was minimally verbal also decreased fussiness and seems to have improved after blood pressure stabilized, likely was in the setting of UTI/hypotension. keep on supportive measures, fall precaution.  Hypotension suspect due to hypovolemia and also due to UTI: Continue on cefepime follow-up culture.  S/p multiple IV fluid boluses 2/25.  Continue normal saline.  Blood pressure has improved.  Hold off on lisinopril for now. Lactic acid has remained stable. Recent Labs  Lab 11/03/20 1139 11/03/20 1150 11/04/20 1150 11/04/20 1440 11/04/20 1931 11/04/20 2259 11/05/20 0439  WBC  --  5.3  --   --   --   --  3.9*  LATICACIDVEN 1.0  --  0.8 0.9 0.8 1.1  --    Hypertension see above.  Diarrhea overnight- type7 stool- c diff ordered but  no diarrhea since 2/25 am  Hyperkalemia due to lisinopril dehydration, status post Lokelma. k Improved.  Non-anion gap metabolic acidosis bicarbonate 20. Cont to monitor.  UTI POA/Chronic Foley for chronic retention/recently completed Macrobid for UTI: Foley was exchanged last Monday.  UA grossly abnormal in ED. Urine  cx + Pseudomonas - suspecting infection in the setting of chronic indwelling Foley.  Continue on cefepime.  Stroke with left-sided weakness on Eliquis.  At baseline able to tell her name and date of birth patient was minimally verbal on admission and now seems to be improved.  CT head on admission showed large chronic right MCA infarct, small chronic infarct in the right cerebellum and chronic small vessel ischemic changes.  Chronic dysphagia with G-tube in place, dietitian consulted.  Dietitian consulted continue to feed.   Hypothyroidism: Continue Synthroid  Anemia likely from chronic disease follow-up anemia panel iron study Recent Labs  Lab 11/03/20 1150 11/03/20 1438 11/05/20 0439  HGB 10.5* 8.8* 8.8*  HCT 29.9* 26.0* 24.8*   Heart murmur- needs outpatient echocardiogram.  Chronic debility with multiple complex comorbidities.  Palliative care has been consulted  Pressure ulcer see below  severe protein calorie malnutrition Nutrition Problem: Severe Malnutrition Etiology: chronic illness (chronic dysphagia) Signs/Symptoms: severe fat depletion,severe muscle depletion Interventions: Tube feeding,Prostat  Goals of care:Full code but patient is debilitated elderly multiple complex comorbidities at risk of acute decompensation.Palliative care consulted remains full code but encouraged to discuss further, prognosis does not appear bright.  Nutrition: Diet Order            Diet NPO time specified  Diet effective now               Patient's Body mass index is 28.66 kg/m.  Pressure Ulcer: Pressure Injury 11/03/20 Buttocks Right Stage 2 -  Partial thickness loss of dermis presenting as a shallow open injury with a red, pink wound bed without slough. (Active)  11/03/20 2330  Location: Buttocks  Location Orientation: Right  Staging: Stage 2 -  Partial thickness loss of dermis presenting as a shallow open injury with a red, pink wound bed without slough.  Wound Description  (Comments):   Present on Admission: Yes     Pressure Injury 11/03/20 Buttocks Left Stage 2 -  Partial thickness loss of dermis presenting as a shallow open injury with a red, pink wound bed without slough. (Active)  11/03/20 2330  Location: Buttocks  Location Orientation: Left  Staging: Stage 2 -  Partial thickness loss of dermis presenting as a shallow open injury with a red, pink wound bed without slough.  Wound Description (Comments):   Present on Admission: Yes    DVT prophylaxis: eliquis Code Status:   Code Status: Full Code Family Communication: plan of care discussed with son on the phone previously. W/ RN  Status is: Inpatient Remains inpatient appropriate because: Of ongoing management of severe hyponatremia, UTI.    Dispo: The patient is from: Home              Anticipated d/c is to: tbd              Patient currently is not medically stable to d/c.   Difficult to place patient No  Consultants:see note  Procedures:see note  Unresulted Labs (From admission, onward)          Start     Ordered   11/05/20 0648  Sodium  Now then every 6 hours,   R (with TIMED occurrences)  Question:  Specimen collection method  Answer:  Lab=Lab collect   11/05/20 0647   11/05/20 0500  CBC  Daily,   R     Question:  Specimen collection method  Answer:  Lab=Lab collect   11/04/20 0911   11/05/20 0500  Basic metabolic panel  Daily,   R     Question:  Specimen collection method  Answer:  Lab=Lab collect   11/04/20 0911   11/04/20 0912  C Difficile Quick Screen w PCR reflex  (C Difficile quick screen w PCR reflex panel)  Once, for 24 hours,   TIMED       References:    CDiff Information Tool   11/04/20 0911         Culture/Microbiology    Component Value Date/Time   SDES BLOOD FOOT 11/03/2020 1139   SDES IN/OUT CATH URINE 11/03/2020 1139   SPECREQUEST  11/03/2020 1139    BOTTLES DRAWN AEROBIC AND ANAEROBIC Blood Culture adequate volume   SPECREQUEST  11/03/2020 1139     NONE Performed at [facility], [address] [place], [place] [address]    CULT  11/03/2020 1139    NO GROWTH 1 DAY Performed at [facility], [address] [place], [place] [address]    CULT >=100,000 COLONIES/mL PSEUDOMONAS AERUGINOSA (A) 11/03/2020 1139   REPTSTATUS PENDING 11/03/2020 1139   REPTSTATUS PENDING 11/03/2020 1139    Other culture-see note  Medications: Scheduled Meds: . apixaban  5 mg Per Tube BID  . bethanechol  50 mg Per Tube BID  . calcium-vitamin D   Oral BID  . chlorhexidine  15 mL Mouth Rinse BID  . Chlorhexidine Gluconate Cloth  6 each Topical Daily  . clonazePAM  0.5 mg Per Tube QHS  . escitalopram  10 mg Per Tube Daily  . feeding supplement (PROSource TF)  45 mL Per Tube Daily  . folic acid  0.5 mg Per Tube Daily  . levothyroxine  50 mcg Per Tube QAC breakfast  . mouth rinse  15 mL Mouth Rinse q12n4p  . QUEtiapine  12.5 mg Per Tube QHS   Continuous Infusions: . sodium chloride    . ceFEPime (MAXIPIME) IV Stopped (11/04/20 1442)  . feeding supplement (OSMOLITE 1.5 CAL) 50 mL/hr at 11/05/20 0400    Antimicrobials: Anti-infectives (From admission, onward)   Start     Dose/Rate Route Frequency Ordered Stop   11/04/20 1300  ceFEPIme (MAXIPIME) 2 g in sodium chloride 0.9 % 100 mL IVPB        2 g 200 mL/hr over 30 Minutes Intravenous Every 24 hours 11/04/20 1138       Objective: Vitals: Today's Vitals   11/05/20 0500 11/05/20 0530 11/05/20 0600 11/05/20 0630  BP: 104/74 110/67 108/71 (!) 108/95  Pulse: 83 84 84 85  Resp: 14 13 14 12   Temp:      TempSrc:      SpO2: 97% 96% 96% 98%  Weight:      Height:      PainSc:        Intake/Output Summary (Last 24 hours) at 11/05/2020 0717 Last data filed at 11/05/2020 0600 Gross per 24 hour  Intake 3998 ml  Output 1425 ml  Net 2573 ml   Filed Weights   11/03/20 1729  Weight: 83 kg   Weight change:   Intake/Output from previous day: 02/25 0701 - 02/26 0700 In: 3998  [I.V.:1393.5; NG/GT:473.2; IV Piggyback:2131.4] Out: 1425 [Urine:1425] Intake/Output this shift: No  intake/output data recorded. Filed Weights   11/03/20 1729  Weight: 83 kg   Examination: General exam: AAO to self and her DOB,on room air.Old for age, elderly and frail. HEENT:Oral mucosa moist, Ear/Nose WNL grossly, dentition normal. Respiratory system: bilaterally clear,no wheezing or crackles,no use of accessory muscle. Cardiovascular system: S1 & S2 +,No JVD. Gastrointestinal system: Abdomen soft, NT,ND, BS+. Nervous System:Alert, awake, not moving left side. Extremities: No edema, distal peripheral pulses palpable.  Skin: No rashes,no icterus. ZOX:WRUE muscle bulk,tone, power.  Data Reviewed: I have personally reviewed following labs and imaging studies CBC: Recent Labs  Lab 11/03/20 1150 11/03/20 1438 11/05/20 0439  WBC 5.3  --  3.9*  NEUTROABS 4.4  --   --   HGB 10.5* 8.8* 8.8*  HCT 29.9* 26.0* 24.8*  MCV 88.5  --  89.2  PLT 122*  --  111*   Basic Metabolic Panel: Recent Labs  Lab 11/03/20 1415 11/03/20 1438 11/03/20 1908 11/03/20 2131 11/04/20 0451 11/04/20 0738 11/04/20 1150 11/04/20 1440 11/04/20 1615 11/05/20 0116 11/05/20 0439  NA 111* 113* 113* 113*  113* 118*   < > 121* 121* 121* 123* 123*  K 5.6* 5.6* 5.8* 5.3* 4.1  --   --   --   --   --  3.8  CL 84* 83* 87* 86* 89*  --   --   --   --   --  96*  CO2 20*  --  17* 19* 20*  --   --   --   --   --  20*  GLUCOSE 124* 126* 116* 109* 97  --   --   --   --   --  145*  BUN 26* 26* 27* 26* 23  --   --   --   --   --  16  CREATININE 0.58 0.70 0.54 0.59 0.52  --   --   --   --   --  0.43*  CALCIUM 8.6*  --  9.1 8.9 8.9  --   --   --   --   --  8.8*  PHOS 3.4  --  3.3  --   --   --   --   --   --   --   --    < > = values in this interval not displayed.   GFR: Estimated Creatinine Clearance: 58 mL/min (A) (by C-G formula based on SCr of 0.43 mg/dL (L)). Liver Function Tests: Recent Labs  Lab  11/03/20 1150 11/03/20 1415 11/03/20 1908  AST 18  --   --   ALT 10  --   --   ALKPHOS 70  --   --   BILITOT 1.2  --   --   PROT 5.3*  --   --   ALBUMIN 3.0* 2.7* 2.9*   Recent Labs  Lab 11/03/20 1150  LIPASE 40   No results for input(s): AMMONIA in the last 168 hours. Coagulation Profile: Recent Labs  Lab 11/03/20 1150  INR 1.5*   Cardiac Enzymes: No results for input(s): CKTOTAL, CKMB, CKMBINDEX, TROPONINI in the last 168 hours. BNP (last 3 results) No results for input(s): PROBNP in the last 8760 hours. HbA1C: No results for input(s): HGBA1C in the last 72 hours. CBG: Recent Labs  Lab 11/04/20 1107 11/04/20 1545 11/04/20 2017 11/04/20 2350 11/05/20 0340  GLUCAP 88 109* 122* 135* 129*   Lipid Profile: Recent Labs    11/03/20 1908  CHOL 146  HDL 26*  LDLCALC 103*  TRIG 86  CHOLHDL 5.6   Thyroid Function Tests: Recent Labs    11/03/20 1908  TSH 1.414   Anemia Panel: Recent Labs    11/03/20 1908  FERRITIN 766*  TIBC 255  IRON 59   Sepsis Labs: Recent Labs  Lab 11/04/20 1150 11/04/20 1440 11/04/20 1931 11/04/20 2259  LATICACIDVEN 0.8 0.9 0.8 1.1    Recent Results (from the past 240 hour(s))  Blood culture (routine single)     Status: None (Preliminary result)   Collection Time: 11/03/20 11:39 AM   Specimen: BLOOD  Result Value Ref Range Status   Specimen Description BLOOD FOOT  Final   Special Requests   Final    BOTTLES DRAWN AEROBIC AND ANAEROBIC Blood Culture adequate volume   Culture   Final    NO GROWTH 1 DAY Performed at [facility], [address] [place], [place] [address]    Report Status PENDING  Incomplete  Urine culture     Status: Abnormal (Preliminary result)   Collection Time: 11/03/20 11:39 AM   Specimen: In/Out Cath Urine  Result Value Ref Range Status   Specimen Description IN/OUT CATH URINE  Final   Special Requests   Final    NONE Performed at [facility], [address] [place], [place]  [address]    Culture >=100,000 COLONIES/mL PSEUDOMONAS AERUGINOSA (A)  Final   Report Status PENDING  Incomplete  Resp Panel by RT-PCR (Flu A&B, Covid) Urine, Catheterized     Status: None   Collection Time: 11/03/20 11:28 PM   Specimen: Urine, Catheterized; Nasopharyngeal(NP) swabs in vial transport medium  Result Value Ref Range Status   SARS Coronavirus 2 by RT PCR NEGATIVE NEGATIVE Final    Comment: (NOTE) SARS-CoV-2 target nucleic acids are NOT DETECTED.  The SARS-CoV-2 RNA is generally detectable in upper respiratory specimens during the acute phase of infection. The lowest concentration of SARS-CoV-2 viral copies this assay can detect is 138 copies/mL. A negative result does not preclude SARS-Cov-2 infection and should not be used as the sole basis for treatment or other patient management decisions. A negative result may occur with  improper specimen collection/handling, submission of specimen other than nasopharyngeal swab, presence of viral mutation(s) within the areas targeted by this assay, and inadequate number of viral copies(<138 copies/mL). A negative result must be combined with clinical observations, patient history, and epidemiological information. The expected result is Negative.  Fact Sheet for Patients:  BloggerCourse.com  Fact Sheet for Healthcare Providers:  SeriousBroker.it  This test is no t yet approved or cleared by the [place] FDA and  has been authorized for detection and/or diagnosis of SARS-CoV-2 by FDA under an Emergency Use Authorization (EUA). This EUA will remain  in effect (meaning this test can be used) for the duration of the COVID-19 declaration under Section 564(b)(1) of the Act, 21 U.S.C.section 360bbb-3(b)(1), unless the authorization is terminated  or revoked sooner.       Influenza A by PCR NEGATIVE NEGATIVE Final   Influenza B by PCR NEGATIVE NEGATIVE Final    Comment:  (NOTE) The Xpert Xpress SARS-CoV-2/FLU/RSV plus assay is intended as an aid in the diagnosis of influenza from Nasopharyngeal swab specimens and should not be used as a sole basis for treatment. Nasal washings and aspirates are unacceptable for Xpert Xpress SARS-CoV-2/FLU/RSV testing.  Fact Sheet for Patients: BloggerCourse.com  Fact Sheet for Healthcare Providers: SeriousBroker.it  This test is not yet approved  or cleared by the [place] and has been authorized for detection and/or diagnosis of SARS-CoV-2 by FDA under an Emergency Use Authorization (EUA). This EUA will remain in effect (meaning this test can be used) for the duration of the COVID-19 declaration under Section 564(b)(1) of the Act, 21 U.S.C. section 360bbb-3(b)(1), unless the authorization is terminated or revoked.  Performed at [facility], [address] [place], [place] [address]   MRSA PCR Screening     Status: None   Collection Time: 11/03/20 11:35 PM   Specimen: Urine, Catheterized; Nasopharyngeal  Result Value Ref Range Status   MRSA by PCR NEGATIVE NEGATIVE Final    Comment:        The GeneXpert MRSA Assay (FDA approved for NASAL specimens only), is one component of a comprehensive MRSA colonization surveillance program. It is not intended to diagnose MRSA infection nor to guide or monitor treatment for MRSA infections. Performed at [facility], [address] [place], [place] [address]      Radiology Studies: CT HEAD WO CONTRAST  Result Date: 11/03/2020 CLINICAL DATA:  Mental status change EXAM: CT HEAD WITHOUT CONTRAST TECHNIQUE: Contiguous axial images were obtained from the base of the skull through the vertex without intravenous contrast. COMPARISON:  MRIA 01/19/2016, CT brain 07/03/2018, 12/25/2017 FINDINGS: Brain: No acute territorial infarction, hemorrhage or intracranial mass. Large chronic right MCA infarct. Small  chronic infarct in the right cerebellum. Wallerian degeneration of the right mid brain and pons. Ex vacuo dilatation of the right lateral ventricle. Mild hypodensity in the white matter consistent with chronic small vessel ischemic change. Vascular: No hyperdense vessels. Vertebral and carotid vascular calcification. Skull: Normal. Negative for fracture or focal lesion. Sinuses/Orbits: No acute finding. Other: None IMPRESSION: 1. No CT evidence for acute intracranial abnormality. 2. Large chronic right MCA infarct. Small chronic infarct in the right cerebellum. 3. Mild chronic small vessel ischemic changes of the white matter. Electronically Signed   By: Jasmine Pang M.D.   On: 11/03/2020 15:43   DG Chest Port 1 View  Result Date: 11/03/2020 CLINICAL DATA:  Questionable sepsis. EXAM: PORTABLE CHEST 1 VIEW COMPARISON:  Chest x-ray 07/03/2018. FINDINGS: Mediastinum is stable. Heart size normal. No focal infiltrate. Biapical pleural-parenchymal thickening again noted consistent scarring. No pleural effusion or pneumothorax. Prominent skin fold on the right. Degenerative change thoracic spine. IMPRESSION: No acute cardiopulmonary disease. Electronically Signed   By: Maisie Fus  Register   On: 11/03/2020 12:09     LOS: 2 days   Lanae Boast, MD [facility]  11/05/2020, 7:17 AM

## 2020-11-05 NOTE — Progress Notes (Addendum)
Palliative Medicine Inpatient Follow Up Note   Reason for consult:  Goals of Care  HPI:  Per intake H&P --> Tammy A Breweris a 87 y.o.femalewith history of HTN, stroke with residual left-sided weakness, gastric feeding tube, chronic urinary retention on indwelling Foley catheter, hypothyroidism who was presenteddue tovomiting, decreased oral intake and increased coughing,presented with a mental status change.  Palliative care has been consulted for goals of care given [name]'s chronically debilitated state.   Today's Discussion (11/05/2020):  *Please note that this is a verbal dictation therefore any spelling or grammatical errors are due to the "[place]" system interpretation.  I met with [name] in the ICU this morning. She is more bright eyed this morning and able to respond more quickly than yesterday. She is oriented to self only. She denies pain or nausea on exam.   Per discussion with patients bedside RN, Mahala Menghini and her nursing student, Alexa she appears overall improved with IVF resuscitation. Patient has not required vasopressors.   Plan to speak to patients son later in the day to further discuss code status as he sleeps during the morning hours.   Questions and concerns addressed  _______________________________________________ Addendum:  Called patients son, [name] this late afternoon to discuss code status. Left a VM. _______________________________________________ Addendum #2:  I met with patients son, [name] at bedside. He had reviewed the Hard Choices for Loving people book. He shares that he had also spoken to his mother and she herself was able to verbalize not wanting resuscitation. We completed a MOST form together as below:  Cardiopulmonary Resuscitation: Do Not Attempt Resuscitation (DNR/No CPR)  Medical Interventions: Limited Additional Interventions: Use medical treatment, IV fluids and cardiac monitoring as indicated, DO NOT USE  intubation or mechanical ventilation. May consider use of less invasive airway support such as BiPAP or CPAP. Also provide comfort measures. Transfer to the hospital if indicated. Avoid intensive care.   Antibiotics: Antibiotics if indicated  IV Fluids: IV fluids if indicated  Feeding Tube: Feeding tube long-term if indicated   We discussed the joy that [name] still has in her life. [name] states that they have five dogs. Kinlee is closest to her [place], [place] who sleeps with her nightly. He states that she still gets up to her wheelchair and does the pedal bike for an hour a day. They do have interactions which are considered to have meaning. He would like to continue with the current scope of care at this time.   Time in: 1458 Time Out: 1525 Total Time: 27 minutes  Objective Assessment: Vital Signs Vitals:   11/05/20 1000 11/05/20 1030  BP: 106/84 96/70  Pulse: 85 80  Resp: 14 17  Temp:    SpO2: 94% 94%    Intake/Output Summary (Last 24 hours) at 11/05/2020 1129 Last data filed at 11/05/2020 1000 Gross per 24 hour  Intake 4168.09 ml  Output 1250 ml  Net 2918.09 ml   Last Weight  Most recent update: 11/03/2020  5:30 PM   Weight  83 kg (183 lb)           Gen:  Frail Elderly Caucasian F  HEENT: moist mucous membranes CV: Regular rate and rhythm  PULM: On RA ABD: soft/nontender  EXT: (+) edema in BUE Neuro: Alert to self  SUMMARY OF RECOMMENDATIONS DNAR/DNI  MOST Completed, paper copy placed onto the chart electric copy can be found in [place]  DNR Form Completed, paper copy placed onto the chart electric copy can be  found in [place]  Will request OP Palliative support  PMT will continue to incrementally follow however if services are needed sooner please do not hesitate to reach out  Time Spent: 25 Greater than 50% of the time was spent in counseling and coordination of  care ______________________________________________________________________________________ [place] Team Team Cell Phone: [phone] Please utilize secure chat with additional questions, if there is no response within 30 minutes please call the above phone number  Palliative Medicine Team providers are available by phone from 7am to 7pm daily and can be reached through the team cell phone.  Should this patient require assistance outside of these hours, please call the patient's attending physician.

## 2020-11-06 DIAGNOSIS — Z66 Do not resuscitate: Secondary | ICD-10-CM | POA: Diagnosis not present

## 2020-11-06 DIAGNOSIS — Z515 Encounter for palliative care: Secondary | ICD-10-CM | POA: Diagnosis not present

## 2020-11-06 DIAGNOSIS — E871 Hypo-osmolality and hyponatremia: Secondary | ICD-10-CM | POA: Diagnosis not present

## 2020-11-06 DIAGNOSIS — Z7189 Other specified counseling: Secondary | ICD-10-CM | POA: Diagnosis not present

## 2020-11-06 LAB — CBC
HCT: 25.9 % — ABNORMAL LOW (ref 36.0–46.0)
Hemoglobin: 8.8 g/dL — ABNORMAL LOW (ref 12.0–15.0)
MCH: 30.9 pg (ref 26.0–34.0)
MCHC: 34 g/dL (ref 30.0–36.0)
MCV: 90.9 fL (ref 80.0–100.0)
Platelets: 125 10*3/uL — ABNORMAL LOW (ref 150–400)
RBC: 2.85 MIL/uL — ABNORMAL LOW (ref 3.87–5.11)
RDW: 12.9 % (ref 11.5–15.5)
WBC: 5.4 10*3/uL (ref 4.0–10.5)
nRBC: 0 % (ref 0.0–0.2)

## 2020-11-06 LAB — BASIC METABOLIC PANEL
Anion gap: 8 (ref 5–15)
BUN: 12 mg/dL (ref 8–23)
CO2: 21 mmol/L — ABNORMAL LOW (ref 22–32)
Calcium: 8.9 mg/dL (ref 8.9–10.3)
Chloride: 101 mmol/L (ref 98–111)
Creatinine, Ser: 0.42 mg/dL — ABNORMAL LOW (ref 0.44–1.00)
GFR, Estimated: >60 mL/min (ref >60)
Glucose, Bld: 131 mg/dL — ABNORMAL HIGH (ref 70–99)
Potassium: 4.2 mmol/L (ref 3.5–5.1)
Sodium: 130 mmol/L — ABNORMAL LOW (ref 135–145)

## 2020-11-06 LAB — SODIUM: Sodium: 127 mmol/L — ABNORMAL LOW (ref 135–145)

## 2020-11-06 MED ORDER — SODIUM CHLORIDE 0.9 % IV SOLN: INTRAVENOUS | Status: AC

## 2020-11-06 NOTE — Progress Notes (Signed)
IR consulted by Dr. Lupita Leash for possible gastrostomy tube evaluation. Patient with history of dysphagia secondary to CVA s/p PEG placement with GI at [facility] 01/03/2018. Per notes, patient's PEG appears to have mold versus old food residue versus deteriorating tubing.  Discussed case with Dr. Pascal Lux who recommends IR evaluation/possible exchange on Monday 11/07/2020. IR to follow-up tomorrow.  Please call IR with questions/concerns.   Bea Graff Louk, PA-C 11/06/2020, 9:22 AM

## 2020-11-06 NOTE — Progress Notes (Signed)
   Palliative Medicine Inpatient Follow Up Note   Reason for consult:  Goals of Care  HPI:  Per intake H&P --> Shanin A Breweris a 87 y.o.femalewith history of HTN, stroke with residual left-sided weakness, gastric feeding tube, chronic urinary retention on indwelling Foley catheter, hypothyroidism who was presenteddue tovomiting, decreased oral intake and increased coughing,presented with a mental status change.  Palliative care has been consulted for goals of care given Aislee's chronically debilitated state.   Today's Discussion (11/06/2020):  *Please note that this is a verbal dictation therefore any spelling or grammatical errors are due to the "[place] [employer]" system interpretation.  Chart reviewed.   Chantavia had been transferred to 4E last evening in the setting of clinical stability.   I spoke to patient RN, Shanon Brow as he had expressed concern regarding patients gastric tube which looked to have sediment in it which was queried to be old food vs mold. Per our conversation this morning the primary team has consulted IR for an exchange. We also looked at patients IVwatch which was being used in the ICU to detect infiltration. We removed it as this is not remotely monitored on 4E.  Skilynn appears well this morning and I oriented to self. She vocalizes not pain or nausea this morning.   Objective Assessment: Vital Signs Vitals:   11/06/20 0801 11/06/20 1238  BP: (!) 143/65 (!) 156/94  Pulse: 87 88  Resp: 20 19  Temp: 98 F (36.7 C) 97.8 F (36.6 C)  SpO2: 98% 98%    Intake/Output Summary (Last 24 hours) at 11/06/2020 1313 Last data filed at 11/05/2020 1600 Gross per 24 hour  Intake 499.93 ml  Output 400 ml  Net 99.93 ml   Last Weight  Most recent update: 11/03/2020  5:30 PM   Weight  83 kg (183 lb)           Gen:  Frail Elderly Caucasian F  HEENT: moist mucous membranes CV: Regular rate and rhythm  PULM: On RA ABD: soft/nontender  EXT: (+)  edema in BUE Neuro: Alert to self  SUMMARY OF RECOMMENDATIONS DNAR/DNI  MOST Completed, paper copy placed onto the chart electric copy can be found in [employer]  DNR Form Completed, paper copy placed onto the chart electric copy can be found in [employer]  Will request OP Palliative support  PMT will continue to incrementally follow however if services are needed sooner please do not hesitate to reach out  Time Spent: 25 Greater than 50% of the time was spent in counseling and coordination of care ______________________________________________________________________________________ [facility] Team Team Cell Phone: [phone] Please utilize secure chat with additional questions, if there is no response within 30 minutes please call the above phone number  Palliative Medicine Team providers are available by phone from 7am to 7pm daily and can be reached through the team cell phone.  Should this patient require assistance outside of these hours, please call the patient's attending physician.

## 2020-11-06 NOTE — Progress Notes (Signed)
PROGRESS NOTE    [name]  MWU:[id number] DOB: 10/25/1933 DOA: 11/03/2020 PCP: Paulina Fusi, MD   Chief Complaint  Patient presents with  . Cough   Brief Narrative: 87 year old female with history of stroke with left-sided weakness on Eliquis, chronic dysphasia with G-tube in place, hypertension, chronic urine retention status post Foley catheter since 2021 November, hypothyroidism, chronic debility patient able to communicate with family members uses [place] back to move around able to eat solid food but not enough liquid which is used mostly via G-tube ( 60 OZ water daily) As per report  Patient began to have symptoms since last Friday, patient started to have frequent vomiting every time after eating, with stomach content and sometimes streak of blood and blood for 1 day.  Saturday symptoms resolved, but patient appeared to be more lethargic since, and po intake decreased significantly.  Son started tube feeding over the weekend with Osmolite.  Also developed periodic confusions, no fever, no abd pain and no diarrhea.  Patient has had chronic urinary retention and a Foley catheter was placed in 4 months ago with Foley exchanged every 3 days.  Since then patient has developed multiple episodes of UTIs.  Most recently patient developed another UTI last Thursday and was treated with 5 days of Macrobid and the last dose was this Monday.  In the ED,CT head negative, severely low sodium 110 blood pressure borderline low chest x-ray clear UA UTI versus colonization.  Patient admitted and nephrology was consulted.sodium <10,TSH okay osmol 108, patient was transferred to ICU on hypertonic saline. Overnight 2/24  sodium up trended 118- and 3% stopped on 2/25. Patient urine cultures growing Pseudomonas placed on cefepime.  She had multiple episodes of hypotension managed with IV fluid boluses and also hypothermia. Sodium also improved and blood pressure improved after giving multiple IV  fluid boluses Seen by palliative care changed to DNR. BP stable patient transferred out of ICU  Subjective:  Afebrile overnight.  Sodium improved to 130 Blood pressure has improved Sleeping but woke up on calling able to tell me her name Otherwise does not communicate anything else, no other response.  Assessment & Plan:  Subacute hyponatremia, with mild hypovolemia due to GI loss and possibly low solute intake:  Urine sod <10,TSH okay osmol 108, patient was transferred to ICU on hypertonic saline.Off 3% saline 2/25 AM.Sodium improved to 130, patient on normal saline- cont plan as per  nephrology.Patient is not to resume Lexapro or black elderberry per nephrology Recent Labs  Lab 11/05/20 0653 11/05/20 1227 11/05/20 1847 11/06/20 0021 11/06/20 0555  NA 123* 126* 128* 127* 130*   Drowsy/weak frail deconditioned, suspect acute metabolic encephalopathy patient was minimally verbal also decreased responsiveness-this is resolved she is now alert awake and at baseline oriented to self,date of birth.likely  2/2 UTI/hypotension. keep on supportive measures, fall precaution.  Hypotension suspect due to hypovolemia and also due to UTI: Continue on cefepime, Pseudomonas is pansensitive.  Blood pressure has normalized.  Continue to hold lisinopril for now.  Recent Labs  Lab 11/03/20 1139 11/03/20 1150 11/04/20 1150 11/04/20 1440 11/04/20 1931 11/04/20 2259 11/05/20 0439 11/06/20 0555  WBC  --  5.3  --   --   --   --  3.9* 5.4  LATICACIDVEN 1.0  --  0.8 0.9 0.8 1.1  --   --    Hypertension see above.  Diarrhea no recurrence of diarrhea, a Flexi-Seal/C Diff test cancelled.  Hyperkalemia due to lisinopril dehydration, improved  Non-anion gap metabolic acidosis bicarbonate 2 1, monitor   Pseudomonas UTI POA/Chronic Foley for chronic retention/recently completed Macrobid for UTI: Foley was exchanged last Monday.  UA grossly abnormal in ED. Urine cx + Pseudomonas (it is sensitive)  continue on cefepime, suspecting infection in the setting of chronic indwelling Foley.    Stroke with left-sided weakness on Eliquis.  At baseline able to tell her name and date of birth patient was minimally verbal on admission and seems to have resolved.  CT head on admission no acute finding except large chronic right MCA infarct, small chronic infarct in the right cerebellum and chronic small vessel ischemic changes.  Chronic dysphagia with G-tube in place since 01/03/2018, nursing noticed problem with G-tube question discoloration-mold/old food- may need to be changed IR consulted for eval. Dietitian following  Hypothyroidism: On Synthroid  Anemia likely from chronic disease ferritin high, iron normal. Recent Labs  Lab 11/03/20 1150 11/03/20 1438 11/05/20 0439 11/06/20 0555  HGB 10.5* 8.8* 8.8* 8.8*  HCT 29.9* 26.0* 24.8* 25.9*   Heart murmur- needs outpatient echocardiogram.  Chronic debility with multiple complex comorbidities.  Appreciate palliative input  Pressure ulcer see below  severe protein calorie malnutrition Nutrition Problem: Severe Malnutrition Etiology: chronic illness (chronic dysphagia) Signs/Symptoms: severe fat depletion,severe muscle depletion Interventions: Tube feeding,Prostat  Goals of care: Seen by palliative care patient is debilitated elderly multiple complex comorbidities at risk of acute decompensation.Palliative care after discussion with the patient's family changed to DNR.Prognosis does not appear bright.  Nutrition: Diet Order            Diet NPO time specified  Diet effective now               Patient's Body mass index is 28.66 kg/m.  Pressure Ulcer: Pressure Injury 11/03/20 Buttocks Right Stage 2 -  Partial thickness loss of dermis presenting as a shallow open injury with a red, pink wound bed without slough. (Active)  11/03/20 2330  Location: Buttocks  Location Orientation: Right  Staging: Stage 2 -  Partial thickness loss of  dermis presenting as a shallow open injury with a red, pink wound bed without slough.  Wound Description (Comments):   Present on Admission: Yes     Pressure Injury 11/03/20 Buttocks Left Stage 2 -  Partial thickness loss of dermis presenting as a shallow open injury with a red, pink wound bed without slough. (Active)  11/03/20 2330  Location: Buttocks  Location Orientation: Left  Staging: Stage 2 -  Partial thickness loss of dermis presenting as a shallow open injury with a red, pink wound bed without slough.  Wound Description (Comments):   Present on Admission: Yes    DVT prophylaxis: eliquis Code Status:   Code Status: DNR Family Communication: plan of care discussed with son on the phone previously. W/ RN  Status is: Inpatient Remains inpatient appropriate because: Of ongoing management of severe hyponatremia, UTI.    Dispo: The patient is from: Home              Anticipated d/c is to: Home              Patient currently is not medically stable to d/c.   Difficult to place patient No  Consultants:see note  Procedures:see note  Unresulted Labs (From admission, onward)          Start     Ordered   11/05/20 0648  Sodium  Now then every 6 hours,   R (with TIMED occurrences)  Question:  Specimen collection method  Answer:  Lab=Lab collect   11/05/20 0647   11/05/20 0500  CBC  Daily,   R     Question:  Specimen collection method  Answer:  Lab=Lab collect   11/04/20 0911   11/05/20 0500  Basic metabolic panel  Daily,   R     Question:  Specimen collection method  Answer:  Lab=Lab collect   11/04/20 0911         Culture/Microbiology    Component Value Date/Time   SDES BLOOD FOOT 11/03/2020 1139   SDES IN/OUT CATH URINE 11/03/2020 1139   SPECREQUEST  11/03/2020 1139    BOTTLES DRAWN AEROBIC AND ANAEROBIC Blood Culture adequate volume   SPECREQUEST  11/03/2020 1139    NONE Performed at [facility], [address] [place], [place] [address]    CULT   11/03/2020 1139    NO GROWTH 2 DAYS Performed at [facility], [address], [place], [place] [address]    CULT >=100,000 COLONIES/mL PSEUDOMONAS AERUGINOSA (A) 11/03/2020 1139   REPTSTATUS PENDING 11/03/2020 1139   REPTSTATUS 11/05/2020 FINAL 11/03/2020 1139    Other culture-see note  Medications: Scheduled Meds: . apixaban  5 mg Per Tube BID  . bethanechol  50 mg Per Tube BID  . calcium-vitamin D  1 tablet Per Tube BID  . chlorhexidine  15 mL Mouth Rinse BID  . Chlorhexidine Gluconate Cloth  6 each Topical Daily  . clonazePAM  0.5 mg Per Tube QHS  . escitalopram  10 mg Per Tube Daily  . feeding supplement (PROSource TF)  45 mL Per Tube Daily  . folic acid  0.5 mg Per Tube Daily  . levothyroxine  50 mcg Per Tube QAC breakfast  . mouth rinse  15 mL Mouth Rinse q12n4p  . QUEtiapine  12.5 mg Per Tube QHS   Continuous Infusions: . sodium chloride 100 mL/hr at 11/06/20 0353  . ceFEPime (MAXIPIME) IV Stopped (11/05/20 1338)  . feeding supplement (OSMOLITE 1.5 CAL) 50 mL/hr at 11/05/20 0400    Antimicrobials: Anti-infectives (From admission, onward)   Start     Dose/Rate Route Frequency Ordered Stop   11/04/20 1300  ceFEPIme (MAXIPIME) 2 g in sodium chloride 0.9 % 100 mL IVPB        2 g 200 mL/hr over 30 Minutes Intravenous Every 24 hours 11/04/20 1138       Objective: Vitals: Today's Vitals   11/05/20 2045 11/05/20 2139 11/06/20 0023 11/06/20 0400  BP:  131/74 135/76 (!) 149/87  Pulse:  89 92 91  Resp:  19 20 (!) 22  Temp:  97.6 F (36.4 C) 97.6 F (36.4 C) 97.8 F (36.6 C)  TempSrc:  Oral Oral Oral  SpO2:  99% 96% 98%  Weight:      Height:      PainSc: 0-No pain   0-No pain    Intake/Output Summary (Last 24 hours) at 11/06/2020 0731 Last data filed at 11/05/2020 1600 Gross per 24 hour  Intake 1343.99 ml  Output 900 ml  Net 443.99 ml   Filed Weights   11/03/20 1729  Weight: 83 kg   Weight change:   Intake/Output from previous day: 02/26 0701 -  02/27 0700 In: 1344 [I.V.:744; NG/GT:500; IV Piggyback:100] Out: 900 [Urine:900] Intake/Output this shift: No intake/output data recorded. Filed Weights   11/03/20 1729  Weight: 83 kg   Examination: General exam: [name], old. frail NAD, weak appearing. HEENT:Oral mucosa moist,  Ear/Nose WNL grossly, dentition normal. Respiratory system: bilaterally clear,no wheezing or crackles,no use of accessory muscle Cardiovascular system: S1 & S2 +, No JVD,. Gastrointestinal system: Abdomen soft, PEG+- discolored tube, NT,ND, BS+ Nervous System:Alert, awake, contractures upper extremities: Upper extremities Extremities: edema in UE,distal peripheral pulses palpable.  Skin: No rashes,no icterus. MSK: Normal muscle bulk,tone, power  Data Reviewed: I have personally reviewed following labs and imaging studies CBC: Recent Labs  Lab 11/03/20 1150 11/03/20 1438 11/05/20 0439 11/06/20 0555  WBC 5.3  --  3.9* 5.4  NEUTROABS 4.4  --   --   --   HGB 10.5* 8.8* 8.8* 8.8*  HCT 29.9* 26.0* 24.8* 25.9*  MCV 88.5  --  89.2 90.9  PLT 122*  --  111* 125*   Basic Metabolic Panel: Recent Labs  Lab 11/03/20 1415 11/03/20 1438 11/03/20 1908 11/03/20 2131 11/04/20 0451 11/04/20 0738 11/05/20 0439 11/05/20 0653 11/05/20 1227 11/05/20 1847 11/06/20 0021 11/06/20 0555  NA 111*   < > 113* 113*  113* 118*   < > 123* 123* 126* 128* 127* 130*  K 5.6*   < > 5.8* 5.3* 4.1  --  3.8  --   --   --   --  4.2  CL 84*   < > 87* 86* 89*  --  96*  --   --   --   --  101  CO2 20*  --  17* 19* 20*  --  20*  --   --   --   --  21*  GLUCOSE 124*   < > 116* 109* 97  --  145*  --   --   --   --  131*  BUN 26*   < > 27* 26* 23  --  16  --   --   --   --  12  CREATININE 0.58   < > 0.54 0.59 0.52  --  0.43*  --   --   --   --  0.42*  CALCIUM 8.6*  --  9.1 8.9 8.9  --  8.8*  --   --   --   --  8.9  PHOS 3.4  --  3.3  --   --   --   --   --   --   --   --   --    < > = values in this interval not displayed.    GFR: Estimated Creatinine Clearance: 58 mL/min (A) (by C-G formula based on SCr of 0.42 mg/dL (L)). Liver Function Tests: Recent Labs  Lab 11/03/20 1150 11/03/20 1415 11/03/20 1908  AST 18  --   --   ALT 10  --   --   ALKPHOS 70  --   --   BILITOT 1.2  --   --   PROT 5.3*  --   --   ALBUMIN 3.0* 2.7* 2.9*   Recent Labs  Lab 11/03/20 1150  LIPASE 40   No results for input(s): AMMONIA in the last 168 hours. Coagulation Profile: Recent Labs  Lab 11/03/20 1150  INR 1.5*   Cardiac Enzymes: No results for input(s): CKTOTAL, CKMB, CKMBINDEX, TROPONINI in the last 168 hours. BNP (last 3 results) No results for input(s): PROBNP in the last 8760 hours. HbA1C: No results for input(s): HGBA1C in the last 72 hours. CBG: Recent Labs  Lab 11/04/20 1545 11/04/20 2017 11/04/20 2350 11/05/20 0340 11/05/20 1215  GLUCAP 109* 122* 135* 129* 133*  Lipid Profile: Recent Labs    11/03/20 1908  CHOL 146  HDL 26*  LDLCALC 103*  TRIG 86  CHOLHDL 5.6   Thyroid Function Tests: Recent Labs    11/03/20 1908  TSH 1.414   Anemia Panel: Recent Labs    11/03/20 1908  FERRITIN 766*  TIBC 255  IRON 59   Sepsis Labs: Recent Labs  Lab 11/04/20 1150 11/04/20 1440 11/04/20 1931 11/04/20 2259  LATICACIDVEN 0.8 0.9 0.8 1.1    Recent Results (from the past 240 hour(s))  Blood culture (routine single)     Status: None (Preliminary result)   Collection Time: 11/03/20 11:39 AM   Specimen: BLOOD  Result Value Ref Range Status   Specimen Description BLOOD FOOT  Final   Special Requests   Final    BOTTLES DRAWN AEROBIC AND ANAEROBIC Blood Culture adequate volume   Culture   Final    NO GROWTH 2 DAYS Performed at [facility], [address], [place], [place] [address]    Report Status PENDING  Incomplete  Urine culture     Status: Abnormal   Collection Time: 11/03/20 11:39 AM   Specimen: In/Out Cath Urine  Result Value Ref Range Status   Specimen Description  IN/OUT CATH URINE  Final   Special Requests   Final    NONE Performed at [facility], [address] [place], [place] [address]    Culture >=100,000 COLONIES/mL PSEUDOMONAS AERUGINOSA (A)  Final   Report Status 11/05/2020 FINAL  Final   Organism ID, Bacteria PSEUDOMONAS AERUGINOSA (A)  Final      Susceptibility   Pseudomonas aeruginosa - MIC*    CEFTAZIDIME 4 SENSITIVE Sensitive     CIPROFLOXACIN <=0.25 SENSITIVE Sensitive     GENTAMICIN <=1 SENSITIVE Sensitive     IMIPENEM 2 SENSITIVE Sensitive     PIP/TAZO 8 SENSITIVE Sensitive     CEFEPIME 2 SENSITIVE Sensitive     * >=100,000 COLONIES/mL PSEUDOMONAS AERUGINOSA  Resp Panel by RT-PCR (Flu A&B, Covid) Urine, Catheterized     Status: None   Collection Time: 11/03/20 11:28 PM   Specimen: Urine, Catheterized; Nasopharyngeal(NP) swabs in vial transport medium  Result Value Ref Range Status   SARS Coronavirus 2 by RT PCR NEGATIVE NEGATIVE Final    Comment: (NOTE) SARS-CoV-2 target nucleic acids are NOT DETECTED.  The SARS-CoV-2 RNA is generally detectable in upper respiratory specimens during the acute phase of infection. The lowest concentration of SARS-CoV-2 viral copies this assay can detect is 138 copies/mL. A negative result does not preclude SARS-Cov-2 infection and should not be used as the sole basis for treatment or other patient management decisions. A negative result may occur with  improper specimen collection/handling, submission of specimen other than nasopharyngeal swab, presence of viral mutation(s) within the areas targeted by this assay, and inadequate number of viral copies(<138 copies/mL). A negative result must be combined with clinical observations, patient history, and epidemiological information. The expected result is Negative.  Fact Sheet for Patients:  BloggerCourse.com  Fact Sheet for Healthcare Providers:  SeriousBroker.it  This test is no t  yet approved or cleared by the [place] FDA and  has been authorized for detection and/or diagnosis of SARS-CoV-2 by FDA under an Emergency Use Authorization (EUA). This EUA will remain  in effect (meaning this test can be used) for the duration of the COVID-19 declaration under Section 564(b)(1) of the Act, 21 U.S.C.section 360bbb-3(b)(1), unless the authorization is terminated  or revoked  sooner.       Influenza A by PCR NEGATIVE NEGATIVE Final   Influenza B by PCR NEGATIVE NEGATIVE Final    Comment: (NOTE) The Xpert Xpress SARS-CoV-2/FLU/RSV plus assay is intended as an aid in the diagnosis of influenza from Nasopharyngeal swab specimens and should not be used as a sole basis for treatment. Nasal washings and aspirates are unacceptable for Xpert Xpress SARS-CoV-2/FLU/RSV testing.  Fact Sheet for Patients: BloggerCourse.com  Fact Sheet for Healthcare Providers: SeriousBroker.it  This test is not yet approved or cleared by the [place] FDA and has been authorized for detection and/or diagnosis of SARS-CoV-2 by FDA under an Emergency Use Authorization (EUA). This EUA will remain in effect (meaning this test can be used) for the duration of the COVID-19 declaration under Section 564(b)(1) of the Act, 21 U.S.C. section 360bbb-3(b)(1), unless the authorization is terminated or revoked.  Performed at [facility], [address], [place], [place] [address]   MRSA PCR Screening     Status: None   Collection Time: 11/03/20 11:35 PM   Specimen: Urine, Catheterized; Nasopharyngeal  Result Value Ref Range Status   MRSA by PCR NEGATIVE NEGATIVE Final    Comment:        The GeneXpert MRSA Assay (FDA approved for NASAL specimens only), is one component of a comprehensive MRSA colonization surveillance program. It is not intended to diagnose MRSA infection nor to guide or monitor treatment for MRSA  infections. Performed at [facility], [address], [place], [place] [address]      Radiology Studies: No results found.   LOS: 3 days   Lanae Boast, MD [facility]  11/06/2020, 7:31 AM

## 2020-11-06 NOTE — Progress Notes (Signed)
[facility] Progress Note  Name: [name] MRN: [id number] DOB: 25-Mar-1934  Subjective:  UOP 0.9 liters over 2/26.  Noted IR consulted for possible PEG exchange.  She offers no additional hx - able to tell me her name and birthday which is baseline per her son.   Review of systems:   Limited 2/2 known ams at baseline but denies shortness of breath, chest pain, n/v  --------------- Background on consult:  [name] is a 87 y.o. female with history of HTN, stroke with residual left-sided weakness, gastric feeding tube, chronic urinary retention on indwelling Foley catheter, hypothyroidism who was presented due to vomiting, decreased oral intake and increased coughing, seen as a consultation for the evaluation of hyponatremia. Patient is bedridden at baseline with cognitive deficit.  She is alert awake and able to communicate simple thing with families at baseline.  Also able to eat but for last few days he is becoming more lethargic therefore her son was feeding through the gastric tube with Osmolite and water.  She was diagnosed with a UTI last week therefore prescribed Macrobid by her PCP.  In the ER, she was initially hypotensive to systolic BP 90s.  The labs showed sodium 110, potassium 5.8, creatinine 0.62.  She received 1 L of LR with repeat labs showed a sodium of 139 potassium 5.6.  We are consulted to evaluate hyponatremia. She is currently alert awake and following commands.  As per her son, patient's mental status is not far from her baseline.  She was alert awake and following simple commands to me.  She was not able to provide review of system or history to me. Home medication include lisinopril, Synthroid, labetalol, statin.  No diuretics noted.   Intake/Output Summary (Last 24 hours) at 11/06/2020 0952 Last data filed at 11/05/2020 1600 Gross per 24 hour  Intake 1099.65 ml  Output 900 ml  Net 199.65 ml    Vitals:  Vitals:   11/05/20 2139  11/06/20 0023 11/06/20 0400 11/06/20 0801  BP: 131/74 135/76 (!) 149/87 (!) 143/65  Pulse: 89 92 91 87  Resp: 19 20 (!) 22 20  Temp: 97.6 F (36.4 C) 97.6 F (36.4 C) 97.8 F (36.6 C) 98 F (36.7 C)  TempSrc: Oral Oral Oral Oral  SpO2: 99% 96% 98% 98%  Weight:      Height:         Physical Exam: General adult female in bed chronically ill  HEENT normocephalic atraumatic Neck normal circumference trachea midline Lungs clear to auscultation bilaterally normal work of breathing at rest  Heart S1S2 no rub Abdomen soft nontender nondistended Extremities no edema lower extremities; trace to 1+ edema upper Psych calm mood and affect Neuro tells me her name and her birthday; more conversant today   Medications reviewed   Labs:  BMP Latest Ref Rng & Units 11/06/2020 11/06/2020 11/05/2020  Glucose 70 - 99 mg/dL 629(B) - -  BUN 8 - 23 mg/dL 12 - -  Creatinine 2.84 - 1.00 mg/dL 1.32(G) - -  Sodium 401 - 145 mmol/L 130(L) 127(L) 128(L)  Potassium 3.5 - 5.1 mmol/L 4.2 - -  Chloride 98 - 111 mmol/L 101 - -  CO2 22 - 32 mmol/L 21(L) - -  Calcium 8.9 - 10.3 mg/dL 8.9 - -     Assessment/Plan:   #Subacute hyponatremia, mild hypovolemic: Due to GI loss and possibly low solute intake and compounded by lexapro.  Urine osm 108. Urine Na < 10. TSH ok.  -  Continue normal saline at 75 ml/hr x 24 hours then reassess per primary team  - ok for daily BMP's - discontinued more frequent Na checks - discontinued lexapro.  please do not resume lexapro or black elderberry - both home meds per chart list - note IR is assessing her PEG and potentially exchanging   # Hyperkalemia:setting of lisinopril. Resolved.  Would not resume lisinopril  # Recent urinary tract infection and chronic retention requiring indwelling Foley catheter: Reportedly received Macrobid by her PCP.  pseudomonas. abx per primary. On cefepime.  # hypotension - improved with normal saline. Hold lisinopril  #Stroke and  left-sided paresis: Bedbound at baseline.  Nephrology will sign off.  Please do not hesitate to contact us with any questions.   Estanislado Emms, MD 11/06/2020 10:03 AM

## 2020-11-07 ENCOUNTER — Inpatient Hospital Stay (HOSPITAL_COMMUNITY): Payer: Medicare Other

## 2020-11-07 DIAGNOSIS — Z66 Do not resuscitate: Secondary | ICD-10-CM | POA: Diagnosis not present

## 2020-11-07 DIAGNOSIS — Z515 Encounter for palliative care: Secondary | ICD-10-CM | POA: Diagnosis not present

## 2020-11-07 DIAGNOSIS — Z7189 Other specified counseling: Secondary | ICD-10-CM | POA: Diagnosis not present

## 2020-11-07 DIAGNOSIS — E871 Hypo-osmolality and hyponatremia: Secondary | ICD-10-CM | POA: Diagnosis not present

## 2020-11-07 LAB — BASIC METABOLIC PANEL
Anion gap: 9 (ref 5–15)
BUN: 10 mg/dL (ref 8–23)
CO2: 20 mmol/L — ABNORMAL LOW (ref 22–32)
Calcium: 8.9 mg/dL (ref 8.9–10.3)
Chloride: 101 mmol/L (ref 98–111)
Creatinine, Ser: 0.39 mg/dL — ABNORMAL LOW (ref 0.44–1.00)
GFR, Estimated: >60 mL/min (ref >60)
Glucose, Bld: 108 mg/dL — ABNORMAL HIGH (ref 70–99)
Potassium: 4.4 mmol/L (ref 3.5–5.1)
Sodium: 130 mmol/L — ABNORMAL LOW (ref 135–145)

## 2020-11-07 LAB — CBC
HCT: 25.7 % — ABNORMAL LOW (ref 36.0–46.0)
Hemoglobin: 9.1 g/dL — ABNORMAL LOW (ref 12.0–15.0)
MCH: 32 pg (ref 26.0–34.0)
MCHC: 35.4 g/dL (ref 30.0–36.0)
MCV: 90.5 fL (ref 80.0–100.0)
Platelets: 123 10*3/uL — ABNORMAL LOW (ref 150–400)
RBC: 2.84 MIL/uL — ABNORMAL LOW (ref 3.87–5.11)
RDW: 12.9 % (ref 11.5–15.5)
WBC: 7.7 10*3/uL (ref 4.0–10.5)
nRBC: 0 % (ref 0.0–0.2)

## 2020-11-07 LAB — GLUCOSE, CAPILLARY: Glucose-Capillary: 112 mg/dL — ABNORMAL HIGH (ref 70–99)

## 2020-11-07 MED ORDER — ACETAMINOPHEN 325 MG PO TABS
650.0000 mg | ORAL_TABLET | Freq: Four times a day (QID) | ORAL | Status: DC
  Administered 2020-11-07 – 2020-11-09 (×4): 650 mg via ORAL
  Filled 2020-11-07 (×5): qty 2

## 2020-11-07 MED ORDER — FENTANYL CITRATE (PF) 100 MCG/2ML IJ SOLN
12.5000 ug | INTRAMUSCULAR | Status: DC | PRN
Start: 2020-11-07 — End: 2020-11-09

## 2020-11-07 NOTE — Evaluation (Signed)
Physical Therapy Evaluation Patient Details Name: [name] MRN: [id number] DOB: June 12, 1934 Today's Date: 11/07/2020   History of Present Illness  87 y.o. female with medical history significant of stroke with residual left-sided weakness on Eliquis since, chronic dysphagia s/p G tube, HTN, chronic urinary retention status post Foley catheter (since November 2021), hypothyroidism, presented with change of mentation. In ED found to have sodium level of 110 and UTI Admitted 2/24/2 for treatment of symptomatic hyponatremia with acute metabolic encephalopathy and chronic urinary retention post Foley indwelling, 11/07/20 scheduled for G-tube replacment due to blockage.  Clinical Impression  Pt opened eyes in response to her name and then closed them. No verbalization throughout session, only moaning with PROM LUE>RUE>LLE>RLE. All PROM lacking full ROM due to increased edema and increased pain. No family present to provide home set up and PLOF. PT recommending SNF level rehab at this time. PT will continue to follow.     Follow Up Recommendations SNF    Equipment Recommendations  Wheelchair (measurements PT);Wheelchair cushion (measurements PT);Hospital bed       Precautions / Restrictions Precautions Precautions: Other (comment) (G tube) Precaution Comments: replaced 11/07/20 Restrictions Weight Bearing Restrictions: No      Mobility  Bed Mobility               General bed mobility comments: would not tolerate due to pain with PROM and would be total A                  Pertinent Vitals/Pain Pain Assessment: Faces Faces Pain Scale: Hurts whole lot Pain Location: L UE > R UE with PROM Pain Descriptors / Indicators: Grimacing;Guarding;Moaning;Discomfort Pain Intervention(s): Limited activity within patient's tolerance;Monitored during session    Home Living Family/patient expects to be discharged to:: Unsure                 Additional Comments: pt only  able to nod to her name and no family present for home setup    Prior Function           Comments: no family present to confirm     Hand Dominance        Extremity/Trunk Assessment   Upper Extremity Assessment Upper Extremity Assessment: RUE deficits/detail;LUE deficits/detail;Difficult to assess due to impaired cognition RUE Deficits / Details: edemitous, with ROM limited by pain increasing distal to proximal RUE: Unable to fully assess due to pain LUE Deficits / Details: edemitous, with ROM limited by pain worse than R UE increasing distal to proximal LUE: Unable to fully assess due to pain    Lower Extremity Assessment Lower Extremity Assessment: RLE deficits/detail;LLE deficits/detail;Difficult to assess due to impaired cognition RLE Deficits / Details: no active movement, less grimace than UE, hip, knee and ankle lacking full ROM RLE: Unable to fully assess due to pain LLE Deficits / Details: no active movement, less grimace than UE, hip, knee and ankle lacking full ROM LLE: Unable to fully assess due to pain       Communication   Communication: Expressive difficulties (noted from prior hospitalization)  Cognition Arousal/Alertness: Lethargic Behavior During Therapy: Flat affect Overall Cognitive Status: Impaired/Different from baseline                                 General Comments: no family present to provide baseline, pt opens eyes to her name and is not able to be roused any further, and grimaces with  PROM of UE, no command follow      General Comments General comments (skin integrity, edema, etc.): increased Edema, UE > LE        Assessment/Plan    PT Assessment Patient needs continued PT services  PT Problem List Decreased range of motion;Decreased strength;Decreased activity tolerance;Decreased mobility;Decreased cognition;Pain       PT Treatment Interventions DME instruction;Functional mobility training;Therapeutic  activities;Therapeutic exercise;Balance training;Cognitive remediation;Patient/family education    PT Goals (Current goals can be found in the Care Plan section)  Acute Rehab PT Goals Patient Stated Goal: none stated PT Goal Formulation: Patient unable to participate in goal setting Time For Goal Achievement: 11/21/20 Potential to Achieve Goals: Fair    Frequency Min 2X/week    AM-PAC PT "6 Clicks" Mobility  Outcome Measure Help needed turning from your back to your side while in a flat bed without using bedrails?: Total Help needed moving from lying on your back to sitting on the side of a flat bed without using bedrails?: Total Help needed moving to and from a bed to a chair (including a wheelchair)?: Total Help needed standing up from a chair using your arms (e.g., wheelchair or bedside chair)?: Total Help needed to walk in hospital room?: Total Help needed climbing 3-5 steps with a railing? : Total 6 Click Score: 6    End of Session   Activity Tolerance: Patient limited by pain;Patient limited by lethargy;Other (comment) (decreased cognition) Patient left: in bed;with call bell/phone within reach;with bed alarm set Nurse Communication: Mobility status;Other (comment) (need for air bed and Prevalon boots to reduce risk of pressure injury) PT Visit Diagnosis: Muscle weakness (generalized) (M62.81);Other abnormalities of gait and mobility (R26.89);Pain;Hemiplegia and hemiparesis Hemiplegia - Right/Left: Left Hemiplegia - dominant/non-dominant: Non-dominant Hemiplegia - caused by: Cerebral infarction Pain - Right/Left:  (bilateral) Pain - part of body: Shoulder;Arm;Hand;Hip;Knee;Leg;Ankle and joints of foot    Time: 0981-1914 PT Time Calculation (min) (ACUTE ONLY): 21 min   Charges:   PT Evaluation $PT Eval Moderate Complexity: 1 Mod          [name] PT, DPT Acute Rehabilitation Services Pager [phone] Office [phone]   [name]  Fleet 11/07/2020, 1:55 PM

## 2020-11-07 NOTE — Progress Notes (Signed)
   Palliative Medicine Inpatient Follow Up Note  Reason for consult:  Goals of Care  HPI:  Per intake H&P --> Tammy A Breweris a 87 y.o.femalewith history of HTN, stroke with residual left-sided weakness, gastric feeding tube, chronic urinary retention on indwelling Foley catheter, hypothyroidism who was presenteddue tovomiting, decreased oral intake and increased coughing,presented with a mental status change.  Palliative care has been consulted for goals of care given [name]'s chronically debilitated state.   Today's Discussion (11/07/2020):  *Please note that this is a verbal dictation therefore any spelling or grammatical errors are due to the "[employer]" system interpretation.  Chart reviewed.   Met patients bedside RN, [name] this morning. She states that [name] looks to be in pain, noted especially in her upper extremities. She states that the G-Tube exchange did not happen as the medical team was unable to obtain consent from the patients son. We discussed that he often sleeps until the late afternoon which may be why the could not get into contact with him. [name] states that the patient is getting no nutrients and inquires as to if we should be checking blood sugars which we then ordered. Patient had prior been eating at home so a swallow evaluation was ordered.   I then assessed [name] who was in quite a bit of pain in her extremities with movement. She will need some good ROM exercises. We ordered some tylenol ATC to aid in the symptoms as well as low dose fentanyl in the interim until G-tube is placed.  Plan for g-tube exchange hopefully today  Objective Assessment: Vital Signs Vitals:   11/07/20 0745 11/07/20 1245  BP: (!) 156/91 137/71  Pulse: 78 75  Resp: 16 16  Temp: 97.9 F (36.6 C) 97.7 F (36.5 C)  SpO2: 98% 99%    Intake/Output Summary (Last 24 hours) at 11/07/2020 1307 Last data filed at 11/07/2020 1248 Gross per 24 hour  Intake --   Output 4075 ml  Net -4075 ml   Last Weight  Most recent update: 11/03/2020  5:30 PM   Weight  83 kg (183 lb)           Gen:  Frail Elderly Caucasian F  HEENT: moist mucous membranes CV: Regular rate and rhythm  PULM: On RA ABD: soft/nontender  EXT: (+) edema in BUE Neuro: Alert to self  SUMMARY OF RECOMMENDATIONS DNAR/DNI  MOST Completed, paper copy placed onto the chart electric copy can be found in [facility]  DNR Form Completed, paper copy placed onto the chart electric copy can be found in [facility]  Will request OP Palliative support  Plan for G-tube exchange  Started on tylenol ATC with fentanyl IVP for breakthrough pain  Await follow up swallow evaluation  PMT will continue to incrementally follow however if services are needed sooner please do not hesitate to reach out  Time Spent: 25 Greater than 50% of the time was spent in counseling and coordination of care ______________________________________________________________________________________ [facility] Team Team Cell Phone: [phone] Please utilize secure chat with additional questions, if there is no response within 30 minutes please call the above phone number  Palliative Medicine Team providers are available by phone from 7am to 7pm daily and can be reached through the team cell phone.  Should this patient require assistance outside of these hours, please call the patient's attending physician.

## 2020-11-07 NOTE — Progress Notes (Signed)
SLP Cancellation Note  Patient Details Name: [name] MRN: [id number] DOB: 07-06-1934   Cancelled treatment:       Reason Eval/Treat Not Completed: Medical issues which prohibited therapy. Per RN, pt is in IR this morning for potential exchange of her G-tube. Will f/u as able.     Osie Bond., M.A. [facility] Acute Rehabilitation Services Pager [phone] Office [phone]  11/07/2020, 9:47 AM

## 2020-11-07 NOTE — Progress Notes (Signed)
Appropriate Use Committee  Case discussed with attending to identify back-up plan given that this patient has a high risk of re-admission and is likely approaching custodial care. Plan will be to return home with family if SNF denied by insurance.  [name] 11/07/2020 4:57 PM

## 2020-11-07 NOTE — Progress Notes (Signed)
Speech Language Pathology Treatment: Dysphagia  Patient Details Name: [name] MRN: [id number] DOB: [birth year] Today's Date: 11/07/2020 Time: 4098-1191 SLP Time Calculation (min) (ACUTE ONLY): 18 min  Assessment / Plan / Recommendation Clinical Impression  Pt was not able to be observed with PO trials this afternoon given ongoing potential for PEG replacement; however, her son was present this afternoon for education and discussion about pt's baseline. He shares that pt was able to eat most regular solids except for leafy greens, and that she would use thin liquids to help get the food down, but with limited intake. Upon further clarification, he was limiting intake because she was coughing with thin liquids and he wanted to reduce her risk of aspiration. He denies any coughing with slightly thicker consistencies, such as her supplements. SLP provided education about presentation during initial evaluation, and although he acknowledges that anterior loss of saliva is pt's baseline, he also says that her change in mentation was limiting her oral intake at home shortly leading up to admission. We discussed the potential impact that her mental status can still have on her swallowing. At this point in time, although he and his mother would both like for her to resume oral intake, they would only like to do so if the perceived risk of aspiration is low. They would be open to additional swallow studies, modified diets, strategies, and even holding of POs as indicated by further work up. Will continue to follow for ongoing assessment of oropharyngeal swallowing.    HPI HPI: Pt is an 87 yo female presenting with AMS and recent vomiting. Pt was evaluated for swallowing in 2017 with mild oral weakness for which she compensated with Mod I. Regular solids and thin liquids were recommended. In 2019 she was seen at OSH with more severe oropharyngeal dysphagia and NPO was recommended.  PMH also includes: CVA  with residual L-sided weakness, chronic dysphagia s/p G-tube (able to eat solid food but not enough liquids per MD H&P note), HTN, chronic urinary retention s/p foley catheter, hypothyroidism      SLP Plan  Continue with current plan of care       Recommendations  Diet recommendations: NPO Medication Administration: Via alternative means                Oral Care Recommendations: Oral care QID Follow up Recommendations:  (tba) SLP Visit Diagnosis: Dysphagia, unspecified (R13.10) Plan: Continue with current plan of care       GO                Mahala Menghini., M.A. CCC-SLP Acute Rehabilitation Services Pager [phone] Office [phone]  11/07/2020, 4:48 PM

## 2020-11-07 NOTE — Progress Notes (Signed)
PROGRESS NOTE    [name]  ZOX:[id number] DOB: 22-Dec-1933 DOA: 11/03/2020 PCP: Paulina Fusi, MD   Chief Complaint  Patient presents with  . Cough   Brief Narrative: 87 year old female with history of stroke with left-sided weakness on Eliquis, chronic dysphasia with G-tube in place, hypertension, chronic urine retention status post Foley catheter since 2021 November, hypothyroidism, chronic debility patient able to communicate with family members uses [place] back to move around able to eat solid food but not enough liquid which is used mostly via G-tube ( 60 OZ water daily) As per report  Patient began to have symptoms since last Friday, patient started to have frequent vomiting every time after eating, with stomach content and sometimes streak of blood and blood for 1 day.  Saturday symptoms resolved, but patient appeared to be more lethargic since, and po intake decreased significantly.  Son started tube feeding over the weekend with Osmolite.  Also developed periodic confusions, no fever, no abd pain and no diarrhea.  Patient has had chronic urinary retention and a Foley catheter was placed in 4 months ago with Foley exchanged every 3 days.  Since then patient has developed multiple episodes of UTIs.  Most recently patient developed another UTI last Thursday and was treated with 5 days of Macrobid and the last dose was this Monday.  In the ED,CT head negative, severely low sodium 110 blood pressure borderline low chest x-ray clear UA UTI versus colonization.  Patient admitted and nephrology was consulted.sodium <10,TSH okay osmol 108, patient was transferred to ICU on hypertonic saline. Overnight 2/24  sodium up trended 118- and 3% stopped on 2/25. Patient urine cultures growing Pseudomonas placed on cefepime.  She had multiple episodes of hypotension managed with IV fluid boluses and also hypothermia. Sodium also improved and blood pressure improved after giving multiple IV  fluid boluses Seen by palliative care changed to DNR. BP stable patient transferred out of ICU Patient noted to have discoloration/food ?mold on PEG-IR consulted to eval for possible exchange  Subjective: Seen and examined this morning.  She is on the way to IR Was alert, awake appears close to baseline Afebrile, blood pressure stable, sodium improved to 130. At baseline able to tell name otherwise not communicate  Assessment & Plan:  Subacute hyponatremia, with mild hypovolemia due to GI loss and possibly low solute intake:  Urine sod <10,TSH okay osmol 108, patient was transferred to ICU on hypertonic saline.Off 3% saline 2/25 AM.Sodium improved and holding stable at 130.  On gentle iv nss as per nephrology.  Continue to check daily BMP.  Patient is not to resume Lexapro or black elderberry per nephrology Recent Labs  Lab 11/05/20 1227 11/05/20 1847 11/06/20 0021 11/06/20 0555 11/07/20 0119  NA 126* 128* 127* 130* 130*   Drowsy/weak frail deconditioned, suspect acute metabolic encephalopathy patient was minimally verbal also decreased responsiveness on admission and had resolved.  Alert oriented to self.At baseline oriented to self,date of birth.likely  2/2 UTI/hypotension.  Cont supportive measures, fall precaution.  Hypotension suspect due to hypovolemia and also due to UTI: Sensitive to cefepime continue the same to complete the course at least 5 days.Blood pressure has normalized.  Continue to hold lisinopril for now.  Recent Labs  Lab 11/03/20 1139 11/03/20 1150 11/04/20 1150 11/04/20 1440 11/04/20 1931 11/04/20 2259 11/05/20 0439 11/06/20 0555 11/07/20 0119  WBC  --  5.3  --   --   --   --  3.9* 5.4 7.7  LATICACIDVEN 1.0  --  0.8 0.9 0.8 1.1  --   --   --    Hypertension see above.  Diarrhea no recurrence of diarrhea, a Flexi-Seal/C Diff test cancelled.  Hyperkalemia stable/improved Non-anion gap metabolic acidosis bicarbonate 20, monitor   Pseudomonas UTI  POA/Chronic Foley for chronic retention/recently completed Macrobid for UTI: Foley was exchanged last Monday.  UA grossly abnormal in ED. Urine cx + Pseudomonas and will continue on cefepime as suspecting infection in the setting of chronic indwelling Foley.    Hx of ischemic Stroke with left-sided weakness on Eliquis.  At baseline able to tell her name and date of birth -at this time stable no new finding on CT head that also showed large chronic right MCA infarct, small chronic infarct in the right cerebellum and chronic small vessel ischemic changes.  Chronic dysphagia with G-tube in place since 01/03/2018, nursing noticed problem with G-tube question discoloration-mold/old food-IR eval today for possible exchange.IR unable to get consent-called son and left message to call back to nursind station.   Hypothyroidism: Continue Synthroid  Anemia likely from chronic disease ferritin high, iron normal.  Monitor hemoglobin Recent Labs  Lab 11/03/20 1150 11/03/20 1438 11/05/20 0439 11/06/20 0555 11/07/20 0119  HGB 10.5* 8.8* 8.8* 8.8* 9.1*  HCT 29.9* 26.0* 24.8* 25.9* 25.7*   Heart murmur- needs outpatient echocardiogram.  Chronic debility with multiple complex comorbidities.Appreciate palliative input  Severe protein calorie malnutrition Nutrition Problem: Severe Malnutrition Etiology: chronic illness (chronic dysphagia) Signs/Symptoms: severe fat depletion,severe muscle depletion Interventions: Tube feeding,Prostat  Goals of care: Seen by palliative care patient is debilitated elderly multiple complex comorbidities at risk of acute decompensation.Palliative care after discussion with the patient's family changed to DNR.Prognosis does not appear bright.  Nutrition: Diet Order            Diet NPO time specified  Diet effective now               Patient's Body mass index is 28.66 kg/m.  Pressure Ulcer: POA Pressure Injury 11/03/20 Buttocks Right Stage 2 -  Partial thickness loss  of dermis presenting as a shallow open injury with a red, pink wound bed without slough. (Active)  11/03/20 2330  Location: Buttocks  Location Orientation: Right  Staging: Stage 2 -  Partial thickness loss of dermis presenting as a shallow open injury with a red, pink wound bed without slough.  Wound Description (Comments):   Present on Admission: Yes     Pressure Injury 11/03/20 Buttocks Left Stage 2 -  Partial thickness loss of dermis presenting as a shallow open injury with a red, pink wound bed without slough. (Active)  11/03/20 2330  Location: Buttocks  Location Orientation: Left  Staging: Stage 2 -  Partial thickness loss of dermis presenting as a shallow open injury with a red, pink wound bed without slough.  Wound Description (Comments):   Present on Admission: Yes    DVT prophylaxis: eliquis Code Status:   Code Status: DNR Family Communication: plan of care discussed with son on the phone previously. W/ RN this am Called son's no- no answer.  Status is: Inpatient Remains inpatient appropriate because: Of ongoing management of severe hyponatremia, UTI.    Dispo: The patient is from: Home              Anticipated d/c is to: Home.  Obtain PT OT evaluation for disposition.  Anticipate discharge tomorrow              Patient currently is not medically stable  to d/c.   Difficult to place patient No  Consultants:see note  Procedures:see note  Unresulted Labs (From admission, onward)         None     Culture/Microbiology    Component Value Date/Time   SDES BLOOD FOOT 11/03/2020 1139   SDES IN/OUT CATH URINE 11/03/2020 1139   SPECREQUEST  11/03/2020 1139    BOTTLES DRAWN AEROBIC AND ANAEROBIC Blood Culture adequate volume   SPECREQUEST  11/03/2020 1139    NONE Performed at [facility], [address] [place], [place] [address]    CULT  11/03/2020 1139    NO GROWTH 3 DAYS Performed at [facility], [address] [place], [place] [address]    CULT  >=100,000 COLONIES/mL PSEUDOMONAS AERUGINOSA (A) 11/03/2020 1139   REPTSTATUS PENDING 11/03/2020 1139   REPTSTATUS 11/05/2020 FINAL 11/03/2020 1139    Other culture-see note  Medications: Scheduled Meds: . apixaban  5 mg Per Tube BID  . bethanechol  50 mg Per Tube BID  . calcium-vitamin D  1 tablet Per Tube BID  . chlorhexidine  15 mL Mouth Rinse BID  . Chlorhexidine Gluconate Cloth  6 each Topical Daily  . clonazePAM  0.5 mg Per Tube QHS  . feeding supplement (PROSource TF)  45 mL Per Tube Daily  . folic acid  0.5 mg Per Tube Daily  . levothyroxine  50 mcg Per Tube QAC breakfast  . mouth rinse  15 mL Mouth Rinse q12n4p  . QUEtiapine  12.5 mg Per Tube QHS   Continuous Infusions: . sodium chloride 75 mL/hr at 11/06/20 1740  . ceFEPime (MAXIPIME) IV 2 g (11/06/20 1447)  . feeding supplement (OSMOLITE 1.5 CAL) 50 mL/hr at 11/05/20 0400    Antimicrobials: Anti-infectives (From admission, onward)   Start     Dose/Rate Route Frequency Ordered Stop   11/04/20 1300  ceFEPIme (MAXIPIME) 2 g in sodium chloride 0.9 % 100 mL IVPB        2 g 200 mL/hr over 30 Minutes Intravenous Every 24 hours 11/04/20 1138       Objective: Vitals: Today's Vitals   11/06/20 2140 11/07/20 0500 11/07/20 0503 11/07/20 0745  BP:  (!) 157/86 (!) 157/86 (!) 156/91  Pulse:  77 76 78  Resp:  19 16 16   Temp:   98.4 F (36.9 C) 97.9 F (36.6 C)  TempSrc:   Oral Oral  SpO2:  98% 99% 98%  Weight:      Height:      PainSc: 0-No pain       Intake/Output Summary (Last 24 hours) at 11/07/2020 0755 Last data filed at 11/07/2020 0600 Gross per 24 hour  Intake --  Output 3375 ml  Net -3375 ml   Filed Weights   11/03/20 1729  Weight: 83 kg   Weight change:   Intake/Output from previous day: 02/27 0701 - 02/28 0700 In: -  Out: 3375 [Urine:3375] Intake/Output this shift: No intake/output data recorded. Filed Weights   11/03/20 1729  Weight: 83 kg   Examination:  General exam: Alert awake  oriented to self, old for age, not in distress  HEENT:Oral mucosa moist, Ear/Nose WNL grossly, dentition normal. Respiratory system: bilaterally clear,no wheezing or crackles,no use of accessory muscle Cardiovascular system: S1 & S2 +, No JVD,. Gastrointestinal system: Abdomen soft, PEG tube present and is blackish discolored,ND, BS+ Nervous System:Alert, awake, upper extremity contractures present  Extremities: No edema, distal peripheral pulses palpable.  Skin: No rashes,no icterus. LKG:MWNU  muscle bulk,tone, power  Data Reviewed: I have personally reviewed following labs and imaging studies CBC: Recent Labs  Lab 11/03/20 1150 11/03/20 1438 11/05/20 0439 11/06/20 0555 11/07/20 0119  WBC 5.3  --  3.9* 5.4 7.7  NEUTROABS 4.4  --   --   --   --   HGB 10.5* 8.8* 8.8* 8.8* 9.1*  HCT 29.9* 26.0* 24.8* 25.9* 25.7*  MCV 88.5  --  89.2 90.9 90.5  PLT 122*  --  111* 125* 123*   Basic Metabolic Panel: Recent Labs  Lab 11/03/20 1415 11/03/20 1438 11/03/20 1908 11/03/20 2131 11/04/20 0451 11/04/20 0738 11/05/20 0439 11/05/20 0653 11/05/20 1227 11/05/20 1847 11/06/20 0021 11/06/20 0555 11/07/20 0119  NA 111*   < > 113* 113*  113* 118*   < > 123*   < > 126* 128* 127* 130* 130*  K 5.6*   < > 5.8* 5.3* 4.1  --  3.8  --   --   --   --  4.2 4.4  CL 84*   < > 87* 86* 89*  --  96*  --   --   --   --  101 101  CO2 20*  --  17* 19* 20*  --  20*  --   --   --   --  21* 20*  GLUCOSE 124*   < > 116* 109* 97  --  145*  --   --   --   --  131* 108*  BUN 26*   < > 27* 26* 23  --  16  --   --   --   --  12 10  CREATININE 0.58   < > 0.54 0.59 0.52  --  0.43*  --   --   --   --  0.42* 0.39*  CALCIUM 8.6*  --  9.1 8.9 8.9  --  8.8*  --   --   --   --  8.9 8.9  PHOS 3.4  --  3.3  --   --   --   --   --   --   --   --   --   --    < > = values in this interval not displayed.   GFR: Estimated Creatinine Clearance: 58 mL/min (A) (by C-G formula based on SCr of 0.39 mg/dL (L)). Liver Function  Tests: Recent Labs  Lab 11/03/20 1150 11/03/20 1415 11/03/20 1908  AST 18  --   --   ALT 10  --   --   ALKPHOS 70  --   --   BILITOT 1.2  --   --   PROT 5.3*  --   --   ALBUMIN 3.0* 2.7* 2.9*   Recent Labs  Lab 11/03/20 1150  LIPASE 40   No results for input(s): AMMONIA in the last 168 hours. Coagulation Profile: Recent Labs  Lab 11/03/20 1150  INR 1.5*   Cardiac Enzymes: No results for input(s): CKTOTAL, CKMB, CKMBINDEX, TROPONINI in the last 168 hours. BNP (last 3 results) No results for input(s): PROBNP in the last 8760 hours. HbA1C: No results for input(s): HGBA1C in the last 72 hours. CBG: Recent Labs  Lab 11/04/20 1545 11/04/20 2017 11/04/20 2350 11/05/20 0340 11/05/20 1215  GLUCAP 109* 122* 135* 129* 133*   Lipid Profile: No results for input(s): CHOL, HDL, LDLCALC, TRIG, CHOLHDL, LDLDIRECT in the last 72 hours. Thyroid Function Tests: No results for input(s): TSH, T4TOTAL, FREET4, T3FREE,  THYROIDAB in the last 72 hours. Anemia Panel: No results for input(s): VITAMINB12, FOLATE, FERRITIN, TIBC, IRON, RETICCTPCT in the last 72 hours. Sepsis Labs: Recent Labs  Lab 11/04/20 1150 11/04/20 1440 11/04/20 1931 11/04/20 2259  LATICACIDVEN 0.8 0.9 0.8 1.1    Recent Results (from the past 240 hour(s))  Blood culture (routine single)     Status: None (Preliminary result)   Collection Time: 11/03/20 11:39 AM   Specimen: BLOOD  Result Value Ref Range Status   Specimen Description BLOOD FOOT  Final   Special Requests   Final    BOTTLES DRAWN AEROBIC AND ANAEROBIC Blood Culture adequate volume   Culture   Final    NO GROWTH 3 DAYS Performed at [facility], [address], [place], [place] [address]    Report Status PENDING  Incomplete  Urine culture     Status: Abnormal   Collection Time: 11/03/20 11:39 AM   Specimen: In/Out Cath Urine  Result Value Ref Range Status   Specimen Description IN/OUT CATH URINE  Final   Special Requests   Final     NONE Performed at [facility], [address] [place], [place] [address]    Culture >=100,000 COLONIES/mL PSEUDOMONAS AERUGINOSA (A)  Final   Report Status 11/05/2020 FINAL  Final   Organism ID, Bacteria PSEUDOMONAS AERUGINOSA (A)  Final      Susceptibility   Pseudomonas aeruginosa - MIC*    CEFTAZIDIME 4 SENSITIVE Sensitive     CIPROFLOXACIN <=0.25 SENSITIVE Sensitive     GENTAMICIN <=1 SENSITIVE Sensitive     IMIPENEM 2 SENSITIVE Sensitive     PIP/TAZO 8 SENSITIVE Sensitive     CEFEPIME 2 SENSITIVE Sensitive     * >=100,000 COLONIES/mL PSEUDOMONAS AERUGINOSA  Resp Panel by RT-PCR (Flu A&B, Covid) Urine, Catheterized     Status: None   Collection Time: 11/03/20 11:28 PM   Specimen: Urine, Catheterized; Nasopharyngeal(NP) swabs in vial transport medium  Result Value Ref Range Status   SARS Coronavirus 2 by RT PCR NEGATIVE NEGATIVE Final    Comment: (NOTE) SARS-CoV-2 target nucleic acids are NOT DETECTED.  The SARS-CoV-2 RNA is generally detectable in upper respiratory specimens during the acute phase of infection. The lowest concentration of SARS-CoV-2 viral copies this assay can detect is 138 copies/mL. A negative result does not preclude SARS-Cov-2 infection and should not be used as the sole basis for treatment or other patient management decisions. A negative result may occur with  improper specimen collection/handling, submission of specimen other than nasopharyngeal swab, presence of viral mutation(s) within the areas targeted by this assay, and inadequate number of viral copies(<138 copies/mL). A negative result must be combined with clinical observations, patient history, and epidemiological information. The expected result is Negative.  Fact Sheet for Patients:  BloggerCourse.com  Fact Sheet for Healthcare Providers:  SeriousBroker.it  This test is no t yet approved or cleared by the [place] FDA and   has been authorized for detection and/or diagnosis of SARS-CoV-2 by FDA under an Emergency Use Authorization (EUA). This EUA will remain  in effect (meaning this test can be used) for the duration of the COVID-19 declaration under Section 564(b)(1) of the Act, 21 U.S.C.section 360bbb-3(b)(1), unless the authorization is terminated  or revoked sooner.       Influenza A by PCR NEGATIVE NEGATIVE Final   Influenza B by PCR NEGATIVE NEGATIVE Final    Comment: (NOTE) The Xpert Xpress SARS-CoV-2/FLU/RSV plus assay is intended as an  aid in the diagnosis of influenza from Nasopharyngeal swab specimens and should not be used as a sole basis for treatment. Nasal washings and aspirates are unacceptable for Xpert Xpress SARS-CoV-2/FLU/RSV testing.  Fact Sheet for Patients: BloggerCourse.com  Fact Sheet for Healthcare Providers: SeriousBroker.it  This test is not yet approved or cleared by the [place] FDA and has been authorized for detection and/or diagnosis of SARS-CoV-2 by FDA under an Emergency Use Authorization (EUA). This EUA will remain in effect (meaning this test can be used) for the duration of the COVID-19 declaration under Section 564(b)(1) of the Act, 21 U.S.C. section 360bbb-3(b)(1), unless the authorization is terminated or revoked.  Performed at [facility], [address] [place], [place] [address]   MRSA PCR Screening     Status: None   Collection Time: 11/03/20 11:35 PM   Specimen: Urine, Catheterized; Nasopharyngeal  Result Value Ref Range Status   MRSA by PCR NEGATIVE NEGATIVE Final    Comment:        The GeneXpert MRSA Assay (FDA approved for NASAL specimens only), is one component of a comprehensive MRSA colonization surveillance program. It is not intended to diagnose MRSA infection nor to guide or monitor treatment for MRSA infections. Performed at [facility], [address],  [place], [place] [address]      Radiology Studies: No results found.   LOS: 4 days   Lanae Boast, MD [facility]  11/07/2020, 7:55 AM

## 2020-11-08 ENCOUNTER — Inpatient Hospital Stay (HOSPITAL_COMMUNITY): Payer: Medicare Other

## 2020-11-08 DIAGNOSIS — Z515 Encounter for palliative care: Secondary | ICD-10-CM | POA: Diagnosis not present

## 2020-11-08 DIAGNOSIS — R54 Age-related physical debility: Secondary | ICD-10-CM

## 2020-11-08 DIAGNOSIS — E43 Unspecified severe protein-calorie malnutrition: Secondary | ICD-10-CM | POA: Diagnosis not present

## 2020-11-08 DIAGNOSIS — E871 Hypo-osmolality and hyponatremia: Secondary | ICD-10-CM | POA: Diagnosis not present

## 2020-11-08 LAB — COMPREHENSIVE METABOLIC PANEL
ALT: 10 U/L (ref 0–44)
AST: 13 U/L — ABNORMAL LOW (ref 15–41)
Albumin: 2.1 g/dL — ABNORMAL LOW (ref 3.5–5.0)
Alkaline Phosphatase: 49 U/L (ref 38–126)
Anion gap: 7 (ref 5–15)
BUN: 12 mg/dL (ref 8–23)
CO2: 22 mmol/L (ref 22–32)
Calcium: 8.3 mg/dL — ABNORMAL LOW (ref 8.9–10.3)
Chloride: 101 mmol/L (ref 98–111)
Creatinine, Ser: 0.41 mg/dL — ABNORMAL LOW (ref 0.44–1.00)
GFR, Estimated: >60 mL/min (ref >60)
Glucose, Bld: 85 mg/dL (ref 70–99)
Potassium: 4.1 mmol/L (ref 3.5–5.1)
Sodium: 130 mmol/L — ABNORMAL LOW (ref 135–145)
Total Bilirubin: 1 mg/dL (ref 0.3–1.2)
Total Protein: 4.2 g/dL — ABNORMAL LOW (ref 6.5–8.1)

## 2020-11-08 LAB — MAGNESIUM: Magnesium: 1.2 mg/dL — ABNORMAL LOW (ref 1.7–2.4)

## 2020-11-08 LAB — BLOOD GAS, ARTERIAL
Acid-base deficit: 0.3 mmol/L (ref 0.0–2.0)
Bicarbonate: 23.3 mmol/L (ref 20.0–28.0)
Drawn by: 270271
FIO2: 28
O2 Saturation: 99.2 %
Patient temperature: 37
pCO2 arterial: 34.7 mmHg (ref 32.0–48.0)
pH, Arterial: 7.442 (ref 7.350–7.450)
pO2, Arterial: 137 mmHg — ABNORMAL HIGH (ref 83.0–108.0)

## 2020-11-08 LAB — CBC
HCT: 23.8 % — ABNORMAL LOW (ref 36.0–46.0)
Hemoglobin: 8 g/dL — ABNORMAL LOW (ref 12.0–15.0)
MCH: 31.7 pg (ref 26.0–34.0)
MCHC: 33.6 g/dL (ref 30.0–36.0)
MCV: 94.4 fL (ref 80.0–100.0)
Platelets: 101 10*3/uL — ABNORMAL LOW (ref 150–400)
RBC: 2.52 MIL/uL — ABNORMAL LOW (ref 3.87–5.11)
RDW: 13 % (ref 11.5–15.5)
WBC: 6.7 10*3/uL (ref 4.0–10.5)
nRBC: 0 % (ref 0.0–0.2)

## 2020-11-08 LAB — GLUCOSE, CAPILLARY: Glucose-Capillary: 86 mg/dL (ref 70–99)

## 2020-11-08 LAB — CULTURE, BLOOD (SINGLE)
Culture: NO GROWTH
Special Requests: ADEQUATE

## 2020-11-08 LAB — AMMONIA: Ammonia: 39 umol/L — ABNORMAL HIGH (ref 9–35)

## 2020-11-08 MED ORDER — LACTATED RINGERS IV BOLUS
500.0000 mL | Freq: Once | INTRAVENOUS | Status: AC
  Administered 2020-11-08: 500 mL via INTRAVENOUS

## 2020-11-08 MED ORDER — SODIUM CHLORIDE 0.9 % IV SOLN: INTRAVENOUS | Status: DC

## 2020-11-08 NOTE — Progress Notes (Signed)
Called IR to confirm that signed consent was in their department. It is.  I did telephone consent with son in room and NP Anderson Malta in [place] (11/07/20). Pt resting with call bell within reach.  Will continue to monitor.

## 2020-11-08 NOTE — Progress Notes (Addendum)
Pt's O2 sats started going down to 70s frequently at 2340.  As nurse went to check on the patient, she was found to be breathing shallow and gasping at times.  Was using accessory muscles in neck. The patient was also found not rousable, even with sternal rubs.  Pt was placed on 2 L of O2 on nasal cannula and her O2 sats went up to 99%. Pt did receive her scheduled Klonopin and Seroquel an hour earlier. Paged the on call physician, who ordered STAT chest X-ray and ABG lab draw.  The BP had also dropped from 133/65 at [place] to 94/60 at 2352. The physician also ordered a one time IV 500 cc Lactated Ringer's bolus.  After the bolus the BP was 106/58.  Pt still sleeping and breathing deeply but is able to wake up for a moment with sternal rubbing.  Informed the on call physician.  Will continue to monitor patient for now.  Lupita Dawn, RN

## 2020-11-08 NOTE — Progress Notes (Signed)
Verbal orders from attending MD to hold meds tonight via peg tube. Will continue to monitor.  Fransico Michael, RN

## 2020-11-08 NOTE — Progress Notes (Signed)
PROGRESS NOTE    [name]  HQI:[id number] DOB: [birth year] DOA: 11/03/2020 PCP: Paulina Fusi, MD   Chief Complaint  Patient presents with  . Cough   Brief Narrative: 87 year old female with history of stroke with left-sided weakness on Eliquis, chronic dysphasia with G-tube in place, hypertension, chronic urine retention status post Foley catheter since 2021 November, hypothyroidism, chronic debility patient able to communicate with family members uses [place] back to move around able to eat solid food but not enough liquid which is used mostly via G-tube ( 60 OZ water daily) As per report  Patient began to have symptoms since last Friday, patient started to have frequent vomiting every time after eating, with stomach content and sometimes streak of blood and blood for 1 day.  Saturday symptoms resolved, but patient appeared to be more lethargic since, and po intake decreased significantly.  Son started tube feeding over the weekend with Osmolite.  Also developed periodic confusions, no fever, no abd pain and no diarrhea. Patient has had chronic urinary retention and a Foley catheter was placed in 4 months ago with Foley exchanged every 3 days.  Since then patient has developed multiple episodes of UTIs.  Most recently patient developed another UTI last Thursday and was treated with 5 days of Macrobid and the last dose was this Monday. In the ED,CT head negative, severely low sodium 110 blood pressure borderline low chest x-ray clear UA UTI versus colonization.  Patient admitted and nephrology was consulted.sodium <10,TSH okay osmol 108, patient was transferred to ICU on hypertonic saline. Overnight 2/24  sodium up trended 118- and 3% stopped on 2/25. Patient urine cultures growing Pseudomonas placed on cefepime.  She had multiple episodes of hypotension managed with IV fluid boluses and also hypothermia. Sodium also improved and blood pressure improved after giving multiple IV fluid  boluses Seen by palliative care changed to DNR. BP stable patient transferred out of ICU Patient noted to have discoloration/food ?mold on PEG-IR consulted to eval for possible exchange- unabel to take consent from 11/07/20 2/28-overnight patient was hypoxic in 70s- breathing shallow/labored breathing, not arousable -abg, cxr no acute findings and bolus fluid was given 500 ml.  Subjective: Acute events noted overnight patient was not arousable hypotensive blood pressure in 90s, hypoxic needing oxygen.   Received bolus 500 mL overnight. This morning she did wake up for me and was becoming more arousable throughout the morning.  She was able to tell her name and date of birth but somewhat difficult to understand her speech, we did a CT head that did not see new finding.  Assessment & Plan:  Subacute hyponatremia, with mild hypovolemia due to GI loss and possibly low solute intake:  Urine sod <10,TSH okay osmol 108, patient was transferred to ICU on hypertonic saline.Off 3% saline 2/25 AM.Sodium improved and holding stable at 130.  Keep on IV fluid hydration given lack of p.o.Patient is not to resume Lexapro or black elderberry per nephrology Recent Labs  Lab 11/05/20 1847 11/06/20 0021 11/06/20 0555 11/07/20 0119 11/08/20 0646  NA 128* 127* 130* 130* 130*   Drowsy/weak frail deconditioned, suspect acute metabolic encephalopathy on admission that had improved.  3/31 am- hypoxic in 70s- breathing shallow/labored breathing, not arousable-chest x-ray with no acute finding.  She also had soft blood pressure likely contributed.  Does have baseline memory issues versus oriented to self .seems to have improved.  Palliative to discuss with patient's family today. CT head was done this morning no acute finding .  I was able to take her off the oxygen during exam and she was saturating 98 to 99% on room air.  Minimize/avoid narcotics sedative medication. At baseline oriented to self,date of birth.Cont  supportive measures, fall precaution. Added IV fluids until PEG is working.  Hypotension suspect due to hypovolemia and also due to UTI: Blood pressure soft overnight improving, will keep on IV fluid hydration.   Continue to hold lisinopril for now.   Pseudomonas UTI POA in the setting of chronic indwelling Foley catheter, continue on cefepime.recently completed Macrobid for UTI: Foley was exchanged last Monday.   Recent Labs  Lab 11/03/20 1139 11/03/20 1150 11/04/20 1150 11/04/20 1440 11/04/20 1931 11/04/20 2259 11/05/20 0439 11/06/20 0555 11/07/20 0119 11/08/20 0646  WBC  --  5.3  --   --   --   --  3.9* 5.4 7.7 6.7  LATICACIDVEN 1.0  --  0.8 0.9 0.8 1.1  --   --   --   --    Hypertension soft blood pressure  Diarrhea no recurrence of diarrhea, a Flexi-Seal/C Diff test cancelled.  Hyperkalemia resolved Non-anion gap metabolic acidosis resolved  Hx of ischemic Stroke with left-sided weakness on Eliquis.  At baseline able to tell her name and date of birth -at this time stable no new finding on CT head that also showed large chronic right MCA infarct, small chronic infarct in the right cerebellum and chronic small vessel ischemic changes.  Chronic dysphagia with G-tube in place since 01/03/2018, nursing noticed problem with G-tube question discoloration-mold/old food-IR eval today for possible exchange.hopefully patient will be awake enough and stable for PEG exchange today.   Hypothyroidism: On Synthroid  Anemia likely from chronic disease ferritin high, iron normal.  H&H down to 8.0 monitor closely. Recent Labs  Lab 11/03/20 1438 11/05/20 0439 11/06/20 0555 11/07/20 0119 11/08/20 0646  HGB 8.8* 8.8* 8.8* 9.1* 8.0*  HCT 26.0* 24.8* 25.9* 25.7* 23.8*   Heart murmur- needs outpatient echocardiogram.  Chronic debility with multiple complex comorbidities.Appreciate palliative input  Severe protein calorie malnutrition Nutrition Problem: Severe Malnutrition Etiology:  chronic illness (chronic dysphagia) Signs/Symptoms: severe fat depletion,severe muscle depletion Interventions: Tube feeding,Prostat  Goals of care: Seen by palliative care patient is debilitated elderly multiple complex comorbidities at risk of acute decompensation overnight again unresponsiveness labored breathing and hypoxic.palliative seeing patient this morning.  Patient is DNR.  At risk of decompensation.    Nutrition: Diet Order            Diet NPO time specified  Diet effective now               Patient's Body mass index is 28.66 kg/m.  Pressure Ulcer: POA Pressure Injury 11/03/20 Buttocks Right Stage 2 -  Partial thickness loss of dermis presenting as a shallow open injury with a red, pink wound bed without slough. (Active)  11/03/20 2330  Location: Buttocks  Location Orientation: Right  Staging: Stage 2 -  Partial thickness loss of dermis presenting as a shallow open injury with a red, pink wound bed without slough.  Wound Description (Comments):   Present on Admission: Yes     Pressure Injury 11/03/20 Buttocks Left Stage 2 -  Partial thickness loss of dermis presenting as a shallow open injury with a red, pink wound bed without slough. (Active)  11/03/20 2330  Location: Buttocks  Location Orientation: Left  Staging: Stage 2 -  Partial thickness loss of dermis presenting as a shallow open injury with a red, pink wound  bed without slough.  Wound Description (Comments):   Present on Admission: Yes    DVT prophylaxis: eliquis Code Status:   Code Status: DNR Family Communication: plan of care discussed with son on the phone previously. Discussed nursing staff this morning.  Discussed with palliative team who has tried calling son but no answer Status is: Inpatient Remains inpatient appropriate because: Of ongoing management of severe hyponatremia, UTI.   Dispo: The patient is from: Home              Anticipated d/c is to: SNF per PTOT, if not feasible will return home  with home health.              Patient currently is not medically stable to d/c.   Difficult to place patient No  Consultants:see note  Procedures:see note  Unresulted Labs (From admission, onward)         None     Culture/Microbiology    Component Value Date/Time   SDES BLOOD FOOT 11/03/2020 1139   SDES IN/OUT CATH URINE 11/03/2020 1139   SPECREQUEST  11/03/2020 1139    BOTTLES DRAWN AEROBIC AND ANAEROBIC Blood Culture adequate volume   SPECREQUEST  11/03/2020 1139    NONE Performed at [facility], [address] [place], [place] [address]    CULT  11/03/2020 1139    NO GROWTH 5 DAYS Performed at [facility], [address] [place], [place] [address]    CULT >=100,000 COLONIES/mL PSEUDOMONAS AERUGINOSA (A) 11/03/2020 1139   REPTSTATUS 11/08/2020 FINAL 11/03/2020 1139   REPTSTATUS 11/05/2020 FINAL 11/03/2020 1139    Other culture-see note  Medications: Scheduled Meds: . acetaminophen  650 mg Oral Q6H  . apixaban  5 mg Per Tube BID  . bethanechol  50 mg Per Tube BID  . calcium-vitamin D  1 tablet Per Tube BID  . chlorhexidine  15 mL Mouth Rinse BID  . Chlorhexidine Gluconate Cloth  6 each Topical Daily  . clonazePAM  0.5 mg Per Tube QHS  . feeding supplement (PROSource TF)  45 mL Per Tube Daily  . folic acid  0.5 mg Per Tube Daily  . levothyroxine  50 mcg Per Tube QAC breakfast  . mouth rinse  15 mL Mouth Rinse q12n4p  . QUEtiapine  12.5 mg Per Tube QHS   Continuous Infusions: . sodium chloride 125 mL/hr at 11/08/20 1029  . ceFEPime (MAXIPIME) IV Stopped (11/07/20 1306)  . feeding supplement (OSMOLITE 1.5 CAL) 50 mL/hr at 11/05/20 0400    Antimicrobials: Anti-infectives (From admission, onward)   Start     Dose/Rate Route Frequency Ordered Stop   11/04/20 1300  ceFEPIme (MAXIPIME) 2 g in sodium chloride 0.9 % 100 mL IVPB        2 g 200 mL/hr over 30 Minutes Intravenous Every 24 hours 11/04/20 1138       Objective: Vitals: Today's Vitals    11/08/20 0138 11/08/20 0336 11/08/20 0800 11/08/20 0809  BP: (!) 106/58 (!) 111/51  (!) 99/51  Pulse: 65 67  60  Resp: 20 15  20   Temp: 98.2 F (36.8 C) 98.1 F (36.7 C)  98.4 F (36.9 C)  TempSrc: Axillary Oral  Oral  SpO2: 99% 100%  100%  Weight:      Height:      PainSc:   0-No pain     Intake/Output Summary (Last 24 hours) at 11/08/2020 1134 Last data filed at 11/08/2020 0130 Gross per 24 hour  Intake  790.71 ml  Output 980 ml  Net -189.29 ml   Filed Weights   11/03/20 1729  Weight: 83 kg   Weight change:   Intake/Output from previous day: 02/28 0701 - 03/01 0700 In: 790.7 [IV Piggyback:700.7] Out: 980 [Urine:980] Intake/Output this shift: No intake/output data recorded. Filed Weights   11/03/20 1729  Weight: 83 kg   Examination:  General exam: Somnolent but did wake up for me able to tell me her name and her birth month August on 2 L of cannula but able to come off  HEENT:Oral mucosa moist, Ear/Nose WNL grossly, dentition normal. Respiratory system: bilaterally diminished,no wheezing or crackles,no use of accessory muscle Cardiovascular system: S1 & S2 +, No JVD,. Gastrointestinal system: Abdomen soft, NT, PEG tube present and is blackish discolored, ND, BS+ Nervous System: Contractures present, sleepy -easily arousable Extremities: No edema, distal peripheral pulses palpable.  Skin: No rashes,no icterus. MSK: thin muscle bulk,tone, power  Data Reviewed: I have personally reviewed following labs and imaging studies CBC: Recent Labs  Lab 11/03/20 1150 11/03/20 1438 11/05/20 0439 11/06/20 0555 11/07/20 0119 11/08/20 0646  WBC 5.3  --  3.9* 5.4 7.7 6.7  NEUTROABS 4.4  --   --   --   --   --   HGB 10.5* 8.8* 8.8* 8.8* 9.1* 8.0*  HCT 29.9* 26.0* 24.8* 25.9* 25.7* 23.8*  MCV 88.5  --  89.2 90.9 90.5 94.4  PLT 122*  --  111* 125* 123* 101*   Basic Metabolic Panel: Recent Labs  Lab 11/03/20 1415 11/03/20 1438 11/03/20 1908 11/03/20 2131  11/04/20 0451 11/04/20 0738 11/05/20 0439 11/05/20 6606 11/05/20 1847 11/06/20 0021 11/06/20 0555 11/07/20 0119 11/08/20 0646  NA 111*   < > 113*   < > 118*   < > 123*   < > 128* 127* 130* 130* 130*  K 5.6*   < > 5.8*   < > 4.1  --  3.8  --   --   --  4.2 4.4 4.1  CL 84*   < > 87*   < > 89*  --  96*  --   --   --  101 101 101  CO2 20*  --  17*   < > 20*  --  20*  --   --   --  21* 20* 22  GLUCOSE 124*   < > 116*   < > 97  --  145*  --   --   --  131* 108* 85  BUN 26*   < > 27*   < > 23  --  16  --   --   --  12 10 12   CREATININE 0.58   < > 0.54   < > 0.52  --  0.43*  --   --   --  0.42* 0.39* 0.41*  CALCIUM 8.6*  --  9.1   < > 8.9  --  8.8*  --   --   --  8.9 8.9 8.3*  MG  --   --   --   --   --   --   --   --   --   --   --   --  1.2*  PHOS 3.4  --  3.3  --   --   --   --   --   --   --   --   --   --    < > = values  in this interval not displayed.   GFR: Estimated Creatinine Clearance: 58 mL/min (A) (by C-G formula based on SCr of 0.41 mg/dL (L)). Liver Function Tests: Recent Labs  Lab 11/03/20 1150 11/03/20 1415 11/03/20 1908 11/08/20 0646  AST 18  --   --  13*  ALT 10  --   --  10  ALKPHOS 70  --   --  49  BILITOT 1.2  --   --  1.0  PROT 5.3*  --   --  4.2*  ALBUMIN 3.0* 2.7* 2.9* 2.1*   Recent Labs  Lab 11/03/20 1150  LIPASE 40   Recent Labs  Lab 11/08/20 0839  AMMONIA 39*   Coagulation Profile: Recent Labs  Lab 11/03/20 1150  INR 1.5*   Cardiac Enzymes: No results for input(s): CKTOTAL, CKMB, CKMBINDEX, TROPONINI in the last 168 hours. BNP (last 3 results) No results for input(s): PROBNP in the last 8760 hours. HbA1C: No results for input(s): HGBA1C in the last 72 hours. CBG: Recent Labs  Lab 11/04/20 2350 11/05/20 0340 11/05/20 1215 11/07/20 0954 11/08/20 0542  GLUCAP 135* 129* 133* 112* 86   Lipid Profile: No results for input(s): CHOL, HDL, LDLCALC, TRIG, CHOLHDL, LDLDIRECT in the last 72 hours. Thyroid Function Tests: No results for  input(s): TSH, T4TOTAL, FREET4, T3FREE, THYROIDAB in the last 72 hours. Anemia Panel: No results for input(s): VITAMINB12, FOLATE, FERRITIN, TIBC, IRON, RETICCTPCT in the last 72 hours. Sepsis Labs: Recent Labs  Lab 11/04/20 1150 11/04/20 1440 11/04/20 1931 11/04/20 2259  LATICACIDVEN 0.8 0.9 0.8 1.1    Recent Results (from the past 240 hour(s))  Blood culture (routine single)     Status: None   Collection Time: 11/03/20 11:39 AM   Specimen: BLOOD  Result Value Ref Range Status   Specimen Description BLOOD FOOT  Final   Special Requests   Final    BOTTLES DRAWN AEROBIC AND ANAEROBIC Blood Culture adequate volume   Culture   Final    NO GROWTH 5 DAYS Performed at [facility], [address] [place], [place] [address]    Report Status 11/08/2020 FINAL  Final  Urine culture     Status: Abnormal   Collection Time: 11/03/20 11:39 AM   Specimen: In/Out Cath Urine  Result Value Ref Range Status   Specimen Description IN/OUT CATH URINE  Final   Special Requests   Final    NONE Performed at [facility], [address], [place], [place] [address]    Culture >=100,000 COLONIES/mL PSEUDOMONAS AERUGINOSA (A)  Final   Report Status 11/05/2020 FINAL  Final   Organism ID, Bacteria PSEUDOMONAS AERUGINOSA (A)  Final      Susceptibility   Pseudomonas aeruginosa - MIC*    CEFTAZIDIME 4 SENSITIVE Sensitive     CIPROFLOXACIN <=0.25 SENSITIVE Sensitive     GENTAMICIN <=1 SENSITIVE Sensitive     IMIPENEM 2 SENSITIVE Sensitive     PIP/TAZO 8 SENSITIVE Sensitive     CEFEPIME 2 SENSITIVE Sensitive     * >=100,000 COLONIES/mL PSEUDOMONAS AERUGINOSA  Resp Panel by RT-PCR (Flu A&B, Covid) Urine, Catheterized     Status: None   Collection Time: 11/03/20 11:28 PM   Specimen: Urine, Catheterized; Nasopharyngeal(NP) swabs in vial transport medium  Result Value Ref Range Status   SARS Coronavirus 2 by RT PCR NEGATIVE NEGATIVE Final    Comment: (NOTE) SARS-CoV-2 target nucleic acids  are NOT DETECTED.  The SARS-CoV-2 RNA is generally detectable in upper respiratory  specimens during the acute phase of infection. The lowest concentration of SARS-CoV-2 viral copies this assay can detect is 138 copies/mL. A negative result does not preclude SARS-Cov-2 infection and should not be used as the sole basis for treatment or other patient management decisions. A negative result may occur with  improper specimen collection/handling, submission of specimen other than nasopharyngeal swab, presence of viral mutation(s) within the areas targeted by this assay, and inadequate number of viral copies(<138 copies/mL). A negative result must be combined with clinical observations, patient history, and epidemiological information. The expected result is Negative.  Fact Sheet for Patients:  BloggerCourse.com  Fact Sheet for Healthcare Providers:  SeriousBroker.it  This test is no t yet approved or cleared by the [place] FDA and  has been authorized for detection and/or diagnosis of SARS-CoV-2 by FDA under an Emergency Use Authorization (EUA). This EUA will remain  in effect (meaning this test can be used) for the duration of the COVID-19 declaration under Section 564(b)(1) of the Act, 21 U.S.C.section 360bbb-3(b)(1), unless the authorization is terminated  or revoked sooner.       Influenza A by PCR NEGATIVE NEGATIVE Final   Influenza B by PCR NEGATIVE NEGATIVE Final    Comment: (NOTE) The Xpert Xpress SARS-CoV-2/FLU/RSV plus assay is intended as an aid in the diagnosis of influenza from Nasopharyngeal swab specimens and should not be used as a sole basis for treatment. Nasal washings and aspirates are unacceptable for Xpert Xpress SARS-CoV-2/FLU/RSV testing.  Fact Sheet for Patients: BloggerCourse.com  Fact Sheet for Healthcare Providers: SeriousBroker.it  This test is  not yet approved or cleared by the [place] FDA and has been authorized for detection and/or diagnosis of SARS-CoV-2 by FDA under an Emergency Use Authorization (EUA). This EUA will remain in effect (meaning this test can be used) for the duration of the COVID-19 declaration under Section 564(b)(1) of the Act, 21 U.S.C. section 360bbb-3(b)(1), unless the authorization is terminated or revoked.  Performed at [facility], [address], [place], [place] [address]   MRSA PCR Screening     Status: None   Collection Time: 11/03/20 11:35 PM   Specimen: Urine, Catheterized; Nasopharyngeal  Result Value Ref Range Status   MRSA by PCR NEGATIVE NEGATIVE Final    Comment:        The GeneXpert MRSA Assay (FDA approved for NASAL specimens only), is one component of a comprehensive MRSA colonization surveillance program. It is not intended to diagnose MRSA infection nor to guide or monitor treatment for MRSA infections. Performed at [facility], [address] [place] [place], [place] [address]      Radiology Studies: CT HEAD WO CONTRAST  Result Date: 11/08/2020 CLINICAL DATA:  Mental status change, unknown cause. EXAM: CT HEAD WITHOUT CONTRAST TECHNIQUE: Contiguous axial images were obtained from the base of the skull through the vertex without intravenous contrast. COMPARISON:  Head CT 11/03/2020. Head CT 07/03/2018. Brain MRI 01/19/2016 FINDINGS: Brain: Stable cerebral and cerebellar atrophy. Redemonstrated extensive chronic cortical/subcortical infarction changes within the right cerebral hemisphere affecting the right MCA and ACA territories. Unchanged curvilinear focus of calcification within the infarction territory within the right frontal lobe. Ex vacuo dilatation of the right lateral ventricle. Wallerian degeneration within the right midbrain and pons. Stable background mild ill-defined hypoattenuation within the cerebral white matter which is nonspecific, but compatible with  chronic small vessel ischemic disease. Redemonstrated chronic lacunar infarct within the right cerebellum. There is no acute intracranial hemorrhage. No acute  demarcated cortical infarct. No extra-axial fluid collection. No evidence of intracranial mass. No midline shift. Vascular: No hyperdense vessel.  Atherosclerotic calcifications Skull: Normal. Negative for fracture or focal lesion. Sinuses/Orbits: Visualized orbits show no acute finding. Minimal bilateral ethmoid sinus mucosal thickening. Small right mastoid effusion IMPRESSION: No evidence of acute intracranial abnormality. Redemonstrated extensive chronic infarction changes within the right cerebral hemisphere within the right MCA and ACA vascular territories. Stable background generalized parenchymal atrophy and chronic small vessel ischemic disease. Redemonstrated chronic lacunar infarct within the right cerebellum. Mild ethmoid sinus mucosal thickening. Right mastoid effusion. Electronically Signed   By: Jackey Loge DO   On: 11/08/2020 10:10   DG CHEST PORT 1 VIEW  Result Date: 11/08/2020 CLINICAL DATA:  Hypoxia EXAM: PORTABLE CHEST 1 VIEW COMPARISON:  November 03, 2020 FINDINGS: The heart size and mediastinal contours are mildly enlarged. Aortic knob calcifications are seen. No large airspace consolidation or pleural effusion. No acute osseous abnormality. IMPRESSION: No active disease. Electronically Signed   By: Jonna Clark M.D.   On: 11/08/2020 00:40     LOS: 5 days   Lanae Boast, MD [facility]  11/08/2020, 11:34 AM

## 2020-11-08 NOTE — Progress Notes (Signed)
Occupational Therapy Evaluation Patient Details Name: [name] MRN: [id number] DOB: 11/06/1933 Today's Date: 11/08/2020    History of Present Illness 87 y.o. female with medical history significant of stroke with residual left-sided weakness on Eliquis since, chronic dysphagia s/p G tube, HTN, chronic urinary retention status post Foley catheter (since November 2021), hypothyroidism, presented with change of mentation. In ED found to have sodium level of 110 and UTI Admitted 2/24/2 for treatment of symptomatic hyponatremia with acute metabolic encephalopathy and chronic urinary retention post Foley indwelling, 11/07/20 scheduled for G-tube replacment due to blockage.   Clinical Impression   Pt seen with caregiver present at bedside to provide social and PLOF as pt continues with flat affect and lethargy limiting ability to participate. Noted B UE with edema L>R, baseline pt with L hemi impairments form old stroke(increased tone). At baseline pt is indep with all ADL's, dep for transfers OOB with hoyer lift, with level of indep at grooming and self feeding with set up. Currently pt is heavy dep for repositioning at bed level, and demo's need for max-dep for grooming and self feeding (deferred d/t plan for PEG placement this date). Pt with discomfort and increased resistance to all ROM of UE's, and would benefit from education and ROM/strengtheing at bed level monitoring continued ability and appropriateness for participation. Pt's caregiver confirms that plan/preference is for d/c to home as son is home and 2 caregivers to provide 24hr s/a, with hoyer lift, mechanical bed, and w/c as needed. OT will continue to follow on trial basis, with recommendations listed below.     Follow Up Recommendations  Home health OT;Supervision/Assistance - 24 hour    Equipment Recommendations  None recommended by OT    Recommendations for Other Services       Precautions / Restrictions  Precautions Precautions: Other (comment) Precaution Comments: G tubed, L hemi weakness/tone Restrictions Weight Bearing Restrictions: No      Mobility Bed Mobility Overal bed mobility: Needs Assistance             General bed mobility comments: dep for rolling B directions for pillow placement    Transfers                 General transfer comment: deferred    Balance                                           ADL either performed or assessed with clinical judgement   ADL Overall ADL's : At baseline;Needs assistance/impaired Eating/Feeding: Total assistance                                           Vision Baseline Vision/History: Wears glasses Wears Glasses: At all times Patient Visual Report: Other (comment) (baseline wtih poor vision to L environment/field L eye>r eye)       Perception     Praxis      Pertinent Vitals/Pain Pain Assessment: Faces Faces Pain Scale: Hurts a little bit Pain Location: R UE>LUE with PROM Pain Descriptors / Indicators: Grimacing;Guarding;Moaning;Discomfort Pain Intervention(s): Limited activity within patient's tolerance     Hand Dominance Right   Extremity/Trunk Assessment Upper Extremity Assessment RUE Deficits / Details: edema throughout, limited tolerance for ROM at all digits, elbow and shouler; deferred full stretch.  increased tone present to LUE at all joints LUE Deficits / Details: edema present, limited ROM at all joints with increased tone (baseline)   Lower Extremity Assessment Lower Extremity Assessment: Defer to PT evaluation       Communication Communication Communication: Expressive difficulties   Cognition Arousal/Alertness: Lethargic Behavior During Therapy: Flat affect Overall Cognitive Status: Impaired/Different from baseline                                 General Comments: pt's family confirms that typically pt is able to carry on  conversation and is oriented x3   General Comments  edemitous to B UE's L>R, no pitting observed    Exercises     Shoulder Instructions      Home Living Family/patient expects to be discharged to:: Private residence Living Arrangements: Children;Other relatives;Other (Comment) (24hr caregivers available) Available Help at Discharge: Personal care attendant;Family;Available 24 hours/day Type of Home: House Home Access: Ramped entrance     Home Layout: One level     Bathroom Shower/Tub: Chief Strategy Officer: Handicapped height Bathroom Accessibility: Yes How Accessible: Accessible via wheelchair Home Equipment: Walker - 4 wheels;Bedside commode;Hospital bed (hoyer lift)   Additional Comments: pt's caregiver present to provide social and PLOF; pt is dep for all ADL's and uses hoyer lift for OOB to recliner/wc, in motorized bed, catheter.      Prior Functioning/Environment Level of Independence: Needs assistance  Gait / Transfers Assistance Needed: hoyer lift for transfers ADL's / Homemaking Assistance Needed: dep for bathing/dressing, recently requiring A for self feeding            OT Problem List: Decreased strength;Decreased range of motion;Decreased activity tolerance      OT Treatment/Interventions: Self-care/ADL training;Neuromuscular education;Therapeutic activities;Patient/family education    OT Goals(Current goals can be found in the care plan section) Acute Rehab OT Goals Patient Stated Goal: none stated Time For Goal Achievement: 11/22/20 Potential to Achieve Goals: Fair  OT Frequency: Min 1X/week   Barriers to D/C:            Co-evaluation              AM-PAC OT "6 Clicks" Daily Activity     Outcome Measure Help from another person eating meals?: Total Help from another person taking care of personal grooming?: Total Help from another person toileting, which includes using toliet, bedpan, or urinal?: Total Help from another  person bathing (including washing, rinsing, drying)?: Total Help from another person to put on and taking off regular upper body clothing?: Total Help from another person to put on and taking off regular lower body clothing?: Total 6 Click Score: 6   End of Session    Activity Tolerance: Patient limited by lethargy Patient left: in bed;with family/visitor present;with call bell/phone within reach  OT Visit Diagnosis: Muscle weakness (generalized) (M62.81)                Time: 4034-7425 OT Time Calculation (min): 24 min Charges:  OT General Charges $OT Visit: 1 Visit OT Evaluation $OT Eval Low Complexity: 1 Low  Alassane Kalafut OTR/L acute rehab services Office: [phone]  11/08/2020, 3:11 PM

## 2020-11-08 NOTE — Progress Notes (Signed)
SLP Cancellation Note  Patient Details Name: [name] MRN: [id number] DOB: 1933/09/17   Cancelled treatment:       Reason Eval/Treat Not Completed: Medical issues which prohibited therapy. Pt NPO awaiting PEG replacement today. Will f/u as able.    Osie Bond., M.A. [place] [facility] Pager [phone] Office [phone]  11/08/2020, 1:37 PM

## 2020-11-08 NOTE — Care Management Important Message (Signed)
Important Message  Patient Details  Name: [name] MRN: [id number] Date of Birth: 05-19-1934   Medicare Important Message Given:  Yes     Shelda Altes 11/08/2020, 12:08 PM

## 2020-11-08 NOTE — TOC Progression Note (Addendum)
Transition of Care [facility]) - Progression Note    Patient Details  Name: [name] MRN: [id number] Date of Birth: [birth year]  Transition of [facility] CM/SW [place], [place] Phone Number: 11/08/2020, 11:59 AM  Clinical Narrative:     Pt is not oriented. CSW called pt's son to discuss disposition; no answer, left voicemail requesting return call.        Expected Discharge Plan and Services                                                 Social Determinants of Health (SDOH) Interventions    Readmission Risk Interventions No flowsheet data found.

## 2020-11-08 NOTE — Progress Notes (Signed)
Called by RN that pt is lethargic and not able to arouse. Has shallow breathing and gasping for air per RN. Desat to mid 33's but O2 sat increased with nasal canula.  Pt seen at bedside. Opens eyes to sternal rub and will mumble which is her baseline per RN.  CXR ordered. Do not see infiltrate, pneumothorax or pulm edema--awaiting radiology read of xray.  ABG ordered and pending Continue to monitor

## 2020-11-08 NOTE — Progress Notes (Signed)
Daily Progress Note   Patient Name: [name]       Date: 11/08/2020 DOB: 1933-10-11  Age: 87 y.o. MRN#: [id number] Attending Physician: Lanae Boast, MD Primary Care Physician: Paulina Fusi, MD Admit Date: 11/03/2020  Reason for Consultation/Follow-up: Establishing goals of care  Subjective: PMT received request from attending for followup due to patient having episode of decreased BP and hypoxia last night that is now resolved.  Evaluated patient- she is sleeping, arouses to my voice. Tells me she feels fine.  Discussed with attending- unsure of cause of last night's episode.  Plan for IR to exchange PEG.  Previously- PMT met with family and requested ongoing intermediate level of care with IV fluids and artifical feedings.  SLP eval completed with continued recommendations for NPO status due to feeding tube procedure scheduled.   Review of Systems  Unable to perform ROS: Acuity of condition    Length of Stay: 5  Current Medications: Scheduled Meds:  . acetaminophen  650 mg Oral Q6H  . apixaban  5 mg Per Tube BID  . bethanechol  50 mg Per Tube BID  . calcium-vitamin D  1 tablet Per Tube BID  . chlorhexidine  15 mL Mouth Rinse BID  . Chlorhexidine Gluconate Cloth  6 each Topical Daily  . clonazePAM  0.5 mg Per Tube QHS  . feeding supplement (PROSource TF)  45 mL Per Tube Daily  . folic acid  0.5 mg Per Tube Daily  . levothyroxine  50 mcg Per Tube QAC breakfast  . mouth rinse  15 mL Mouth Rinse q12n4p  . QUEtiapine  12.5 mg Per Tube QHS    Continuous Infusions: . sodium chloride 125 mL/hr at 11/08/20 1029  . ceFEPime (MAXIPIME) IV Stopped (11/07/20 1306)  . feeding supplement (OSMOLITE 1.5 CAL) 50 mL/hr at 11/05/20 0400    PRN Meds: acetaminophen **OR**  acetaminophen, fentaNYL (SUBLIMAZE) injection, hydrALAZINE, ondansetron **OR** ondansetron (ZOFRAN) IV, polyethylene glycol  Physical Exam Vitals and nursing note reviewed.  Constitutional:      General: She is not in acute distress. Cardiovascular:     Rate and Rhythm: Normal rate.  Pulmonary:     Effort: Pulmonary effort is normal.  Neurological:     Mental Status: Mental status is at baseline.  Vital Signs: BP (!) 99/51 (BP Location: Left Arm)   Pulse 60   Temp 98.4 F (36.9 C) (Oral)   Resp 20   Ht 5\' 7"  (1.702 m)   Wt 83 kg   SpO2 100%   BMI 28.66 kg/m  SpO2: SpO2: 100 % O2 Device: O2 Device: Nasal Cannula O2 Flow Rate: O2 Flow Rate (L/min): 2 L/min  Intake/output summary:   Intake/Output Summary (Last 24 hours) at 11/08/2020 1246 Last data filed at 11/08/2020 0130 Gross per 24 hour  Intake 790.71 ml  Output 980 ml  Net -189.29 ml   LBM: Last BM Date: 11/04/20 Baseline Weight: Weight: 83 kg Most recent weight: Weight: 83 kg       Palliative Assessment/Data: PPS: 20%      Patient Active Problem List   Diagnosis Date Noted  . Pressure injury of skin 11/04/2020  . Protein-calorie malnutrition, severe 11/04/2020  . Hyponatremia 11/03/2020  . Acute ischemic right MCA stroke (HCC) 01/23/2016  . Gait disturbance, post-stroke   . Hemiparesis (HCC)   . Essential hypertension   . Dyslipidemia   . Thyroid activity decreased   . E-coli UTI   . Dyspepsia   . Dehydration   . Acute blood loss anemia   . Intracranial vascular stenosis   . Ischemic stroke (HCC) 01/19/2016  . Recurrent UTI 01/19/2016  . HLD (hyperlipidemia) 01/19/2016  . Intertrochanteric fracture of right hip (HCC) 03/23/2014  . HTN (hypertension) 03/23/2014  . Hypothyroidism 03/23/2014  . Anemia 03/23/2014  . Thrombocytopenia, unspecified (HCC) 03/23/2014  . Closed right hip fracture (HCC) 03/23/2014  . Pseudoaneurysm of right femoral artery (HCC) 01/31/2014  . Pseudoaneurysm of  femoral artery (HCC) 01/30/2014  . Splenic artery aneurysm (HCC) 12/04/2013    Palliative Care Assessment & Plan   Patient Profile: Lovette Pucci Breweris a 87 y.o.femalewith history of HTN, stroke with residual left-sided weakness, gastric feeding tube, chronic urinary retention on indwelling Foley catheter, hypothyroidism who was presenteddue tovomiting, decreased oral intake and increased coughing,presented with a mental status change.  Palliative care has been consulted for goals of care given Citlaly's chronically debilitated state.  Assessment/Recommendations/Plan   Called patient's son and left message for followup  Discussed with attending who shares she may be difficult to place- PMT will continue to follow and if patient declines further or is looking to be less than 2 weeks of anticipated life then could evaluate for residential Hospice  Goals of Care and Additional Recommendations:  Limitations on Scope of Treatment: Full Comfort Care and Full Scope Treatment  Code Status:  DNR  Prognosis:   Unable to determine  Discharge Planning:  To Be Determined  Care plan was discussed with patient's nurse and attending physician.  Thank you for allowing the Palliative Medicine Team to assist in the care of this patient.   Total time: 26 minutes Greater than 50%  of this time was spent counseling and coordinating care related to the above assessment and plan.  Ocie Bob, AGNP-C Palliative Medicine   Please contact Palliative Medicine Team phone at [phone] for questions and concerns.

## 2020-11-09 ENCOUNTER — Inpatient Hospital Stay (HOSPITAL_COMMUNITY): Payer: Medicare Other

## 2020-11-09 DIAGNOSIS — I959 Hypotension, unspecified: Secondary | ICD-10-CM | POA: Diagnosis not present

## 2020-11-09 DIAGNOSIS — Z431 Encounter for attention to gastrostomy: Secondary | ICD-10-CM | POA: Diagnosis not present

## 2020-11-09 DIAGNOSIS — E871 Hypo-osmolality and hyponatremia: Secondary | ICD-10-CM | POA: Diagnosis not present

## 2020-11-09 HISTORY — PX: IR GJ TUBE CHANGE: IMG1440

## 2020-11-09 LAB — GLUCOSE, CAPILLARY: Glucose-Capillary: 78 mg/dL (ref 70–99)

## 2020-11-09 MED ORDER — ACETAMINOPHEN 160 MG/5ML PO SOLN
650.0000 mg | Freq: Four times a day (QID) | ORAL | Status: DC
  Administered 2020-11-09 – 2020-11-11 (×7): 650 mg
  Filled 2020-11-09 (×6): qty 20.3

## 2020-11-09 MED ORDER — FREE WATER
200.0000 mL | Freq: Four times a day (QID) | Status: DC
  Administered 2020-11-09 – 2020-11-12 (×13): 200 mL

## 2020-11-09 MED ORDER — LIDOCAINE VISCOUS HCL 2 % MT SOLN
OROMUCOSAL | Status: DC | PRN
  Administered 2020-11-09: 15 mL via OROMUCOSAL

## 2020-11-09 MED ORDER — LIDOCAINE VISCOUS HCL 2 % MT SOLN
OROMUCOSAL | Status: AC
  Filled 2020-11-09: qty 15

## 2020-11-09 MED ORDER — IOHEXOL 300 MG/ML  SOLN
50.0000 mL | Freq: Once | INTRAMUSCULAR | Status: AC | PRN
  Administered 2020-11-09: 15 mL

## 2020-11-09 NOTE — Progress Notes (Signed)
Speech Language Pathology Treatment: Dysphagia  Patient Details Name: [name] MRN: [id number] DOB: 1934/01/10 Today's Date: 11/09/2020 Time:  -     Assessment / Plan / Recommendation Clinical Impression  Pt was more alert today, although still frequently closing her eyes and getting drowsier as session continued. She has much better oral control of her saliva, with no anterior spillage observed. Immediate coughing and throat clearing was noted fairly consistently with thin liquids, concerning for decreased airway protection and presenting more frequently than at her baseline as previously described by her son. No overt s/s of aspiration were observed with purees, although transit is mildly slowed and she needs intermittent cueing for awareness/arousal. Considering potential for fluctuating mentation, recommend starting with snacks of purees from the floor stock, which can be given whenever pt is alert. Will f/u for potential to resume PO diet.    HPI HPI: Pt is an 87 yo female presenting with AMS and recent vomiting. Pt was evaluated for swallowing in 2017 with mild oral weakness for which she compensated with Mod I. Regular solids and thin liquids were recommended. In 2019 she was seen at OSH with more severe oropharyngeal dysphagia and NPO was recommended.  PMH also includes: CVA with residual L-sided weakness, chronic dysphagia s/p G-tube (able to eat solid food but not enough liquids per MD H&P note), HTN, chronic urinary retention s/p foley catheter, hypothyroidism      SLP Plan  Continue with current plan of care       Recommendations  Diet recommendations: NPO (snacks of puree) Medication Administration: Via alternative means                Oral Care Recommendations: Oral care QID Follow up Recommendations: Home health SLP;24 hour supervision/assistance SLP Visit Diagnosis: Dysphagia, unspecified (R13.10) Plan: Continue with current plan of care       GO                 Mahala Menghini., M.A. CCC-SLP Acute Rehabilitation Services Pager [phone] Office [phone]  11/09/2020, 12:09 PM

## 2020-11-09 NOTE — Progress Notes (Signed)
Physical Therapy Treatment Patient Details Name: [name] MRN: [id number] DOB: 1934-02-27 Today's Date: 11/09/2020    History of Present Illness 87 y.o. female with medical history significant of stroke with residual left-sided weakness on Eliquis since, chronic dysphagia s/p G tube, HTN, chronic urinary retention status post Foley catheter (since November 2021), hypothyroidism, presented with change of mentation. In ED found to have sodium level of 110 and UTI Admitted 2/24/2 for treatment of symptomatic hyponatremia with acute metabolic encephalopathy and chronic urinary retention post Foley indwelling, 11/09/20 had G-tube replacment due to blockage.    PT Comments    Pt received in supine, oriented to self only and agreeable to therapy session, with fair participation and tolerance for supine exercises. Pt more alert than previous session, able to initiate AAROM therapeutic exercises with improved 1-step command following noted this date. She does need increased time to respond to commands and denies pain but seems to exhibit discomfort with RUE A/AAROM. Reviewed pressure offloading techniques with pt and encouraged rolling q2H with RN as well, heels floated at end of session and pt bed placed in chair position to work with SLP. Pt continues to benefit from PT services to progress toward functional mobility goals. Continue to recommend SNF, pt may benefit from long term care if family unable to provide level of care needed, will continue to assess.  Follow Up Recommendations  SNF     Equipment Recommendations  Wheelchair (measurements PT);Wheelchair cushion (measurements PT);Hospital bed    Recommendations for Other Services       Precautions / Restrictions Precautions Precautions: Other (comment) Precaution Comments: G tube, L hemi weakness/tone Restrictions Weight Bearing Restrictions: No    Mobility  Bed Mobility Overal bed mobility: Needs Assistance Bed Mobility:  Rolling Rolling: Total assist;+2 for physical assistance         General bed mobility comments: totalA for rolling B directions for repositioning, totalA for posterior supine scoot toward HOB but pt able to slightly raise head up when cued to tuck head for scooting    Transfers                 General transfer comment: deferred due to lack of +2 assist and totalA for rolling/repositioning this date  Ambulation/Gait                 Stairs             Wheelchair Mobility    Modified Rankin (Stroke Patients Only)       Balance                                            Cognition Arousal/Alertness: Awake/alert (drowsy but more alert today) Behavior During Therapy: Flat affect Overall Cognitive Status: Impaired/Different from baseline Area of Impairment: Orientation;Attention;Memory;Following commands;Safety/judgement;Awareness;Problem solving                 Orientation Level: Disoriented to;Place;Time;Situation   Memory: Decreased short-term memory Following Commands: Follows one step commands with increased time;Follows one step commands inconsistently Safety/Judgement: Decreased awareness of deficits   Problem Solving: Slow processing;Decreased initiation;Difficulty sequencing;Requires verbal cues;Requires tactile cues General Comments: pt's family confirms that typically pt is able to carry on conversation and is oriented x3; today able to state name and month/date of birth but not year, not oriented to current location/month/year even with hints x3 or to location/reason for  admission      Exercises General Exercises - Lower Extremity Ankle Circles/Pumps: AAROM;PROM;Both;10 reps;Supine (PROM on L, AA on R) Heel Slides: AAROM;Right;10 reps;Supine Hip ABduction/ADduction: AAROM;Right;Supine;15 reps Straight Leg Raises: AAROM;5 reps;Right;Supine Other Exercises Other Exercises: RUE AROM: gross grasp, finger extension; RUE  AAROM: elbow flexion/ext, shoulder flexion (PROM to AA) 1x10 reps    General Comments General comments (skin integrity, edema, etc.): BUE edema, RN encouraged to roll pt to semi-sidelying for pillow offloading on back after she is given meds (pt in high fowlers at end of session with bed in chair position and heels floated for pressure relief, RN administering meds through G-tube)      Pertinent Vitals/Pain Pain Assessment: Faces (Simultaneous filing. User may not have seen previous data.) Faces Pain Scale: Hurts little more (Simultaneous filing. User may not have seen previous data.) Pain Location: R UE with P/AAROM Pain Descriptors / Indicators: Grimacing;Guarding;Moaning;Discomfort Pain Intervention(s): Monitored during session;Repositioned;RN gave pain meds during session;Limited activity within patient's tolerance    Home Living                      Prior Function            PT Goals (current goals can now be found in the care plan section) Acute Rehab PT Goals Patient Stated Goal: none stated PT Goal Formulation: Patient unable to participate in goal setting Time For Goal Achievement: 11/21/20 Potential to Achieve Goals: Fair Additional Goals Additional Goal #1: Pt will tolerate hoyer lift from bed to [facility] or chair with total assist Progress towards PT goals: Progressing toward goals    Frequency    Min 2X/week      PT Plan Current plan remains appropriate    Co-evaluation              AM-PAC PT "6 Clicks" Mobility   Outcome Measure  Help needed turning from your back to your side while in a flat bed without using bedrails?: Total Help needed moving from lying on your back to sitting on the side of a flat bed without using bedrails?: Total Help needed moving to and from a bed to a chair (including a wheelchair)?: Total Help needed standing up from a chair using your arms (e.g., wheelchair or bedside chair)?: Total Help needed to walk in hospital  room?: Total Help needed climbing 3-5 steps with a railing? : Total 6 Click Score: 6    End of Session Equipment Utilized During Treatment: Other (comment) (transfer pads) Activity Tolerance: Patient limited by fatigue;Other (comment) (global deconditioning, drowsy post-OR) Patient left: in bed;with call bell/phone within reach;with bed alarm set (HOB >30 deg and bed in chair position while RN giving meds through G tube) Nurse Communication: Mobility status;Other (comment) PT Visit Diagnosis: Muscle weakness (generalized) (M62.81);Other abnormalities of gait and mobility (R26.89);Pain;Hemiplegia and hemiparesis Hemiplegia - Right/Left: Left Hemiplegia - dominant/non-dominant: Non-dominant Hemiplegia - caused by: Cerebral infarction Pain - Right/Left: Right Pain - part of body: Shoulder;Arm (moaning with AAROM but then pt denies pain -cog deficit)     Time: 9604-5409 PT Time Calculation (min) (ACUTE ONLY): 17 min  Charges:  $Therapeutic Exercise: 8-22 mins                     Hildagard Sobecki P., PTA Acute Rehabilitation Services Pager: [phone] Office: [phone]    Dorathy Kinsman Jenilyn Magana 11/09/2020, 11:56 AM

## 2020-11-09 NOTE — Progress Notes (Signed)
PROGRESS NOTE    NIHLA HOOS  ZOX:[id number] DOB: 08-11-1934 DOA: 11/03/2020 PCP: Paulina Fusi, MD   Chief Complaint  Patient presents with  . Cough   Brief Narrative: 87 year old female with history of stroke with left-sided weakness on Eliquis, chronic dysphasia with G-tube in place, hypertension, chronic urine retention status post Foley catheter since 2021 November, hypothyroidism, chronic debility patient able to communicate with family members uses peddled mini bike to ground, able to eat some solid food but usually not enough liquids which she consumes via G-tube, she was brought to the ED due to multiple episodes of vomiting, subsequently he also appeared more lethargic, has son switched her over to tube feeds eventually and then she developed worsening confusion. -She also has chronic urinary retention and had a Foley placed in November/21 which gets exchanged every 30 days recently treated with 5 days of Macrobid for presumed UTI 2 days prior to admission -In the ED she was noted to be hyponatremic with serum sodium of 110 she was admitted to the ICU, nephrology was consulted, on 2/24 she was started on hypertonic saline -Subsequently urine cultures grew Pseudomonas and was treated with cefepime -Seen by palliative care changed to DNR. BP stable patient transferred out of ICU Patient noted to have discoloration/food ?mold on PEG-IR consulted to eval for possible exchange  Subjective: -No events overnight, underwent PEG tube exchange in IR today  Assessment & Plan:  Subacute hyponatremia, with mild hypovolemia due to GI loss and possibly low solute intake:   -Urine sod <10,TSH okay osmol 108,  -patient was transferred to ICU on hypertonic saline.Off 3% saline 2/25 AM.Sodium improved and holding stable at 130.  -Discontinue IV fluids, will resume free water via G-tube -Patient is not to resume Lexapro or black elderberry per nephrology  Metabolic encephalopathy -At  baseline has memory deficits, likely component of vascular dementia from multiple prior strokes, exacerbated in the setting of hyponatremia and long hospitalization -More stable now, caution with sedatives  Hypotension due to hypovolemia and UTI:  -Blood pressure is more stable however she has profound 3 third spacing we will discontinue IV fluids today -Hold lisinopril  Pseudomonas UTI POA in the setting of chronic indwelling Foley catheter, continue on cefepime.recently completed Macrobid for UTI: Foley was exchanged 2/22  Diarrhea no recurrence of diarrhea, a Flexi-Seal/C Diff test cancelled.  Hyperkalemia resolved Non-anion gap metabolic acidosis resolved  Hx of ischemic Stroke with left-sided weakness on Eliquis.  At baseline able to tell her name and date of birth -at this time stable no new finding on CT head that also showed large chronic right MCA infarct, small chronic infarct in the right cerebellum and chronic small vessel ischemic changes.  Chronic dysphagia with G-tube in place since 01/03/2018, nursing noticed problem with G-tube question discoloration-mold/old food --IR consulted for possible PEG tube exchange -This was completed today 3/2, resume tube feeds .   Hypothyroidism: On Synthroid  Anemia likely from chronic disease ferritin high, iron normal.  H&H down to 8.0 monitor closely.  Chronic debility with multiple complex comorbidities.Appreciate palliative input  Severe protein calorie malnutrition Nutrition Problem: Severe Malnutrition Etiology: chronic illness (chronic dysphagia) Signs/Symptoms: severe fat depletion,severe muscle depletion Interventions: Tube feeding,Prostat  Goals of care: Seen by palliative care patient is debilitated elderly multiple complex comorbidities at risk of acute decompensation overnight again unresponsiveness labored breathing and hypoxic.palliative seeing patient this morning.  Patient is DNR.  At risk of decompensation.   -Very  poor prognosis -Needs palliative  care follow-up after discharge and transition to hospice as appropriate  Nutrition: Diet Order            Diet NPO time specified  Diet effective now               Patient's Body mass index is 28.66 kg/m.  Pressure Ulcer: POA Pressure Injury 11/03/20 Buttocks Right Stage 2 -  Partial thickness loss of dermis presenting as a shallow open injury with a red, pink wound bed without slough. (Active)  11/03/20 2330  Location: Buttocks  Location Orientation: Right  Staging: Stage 2 -  Partial thickness loss of dermis presenting as a shallow open injury with a red, pink wound bed without slough.  Wound Description (Comments):   Present on Admission: Yes     Pressure Injury 11/03/20 Buttocks Left Stage 2 -  Partial thickness loss of dermis presenting as a shallow open injury with a red, pink wound bed without slough. (Active)  11/03/20 2330  Location: Buttocks  Location Orientation: Left  Staging: Stage 2 -  Partial thickness loss of dermis presenting as a shallow open injury with a red, pink wound bed without slough.  Wound Description (Comments):   Present on Admission: Yes    DVT prophylaxis: eliquis Code Status:   Code Status: DNR Family Communication: No family at bedside, was unable to reach her son Status is: Inpatient Remains inpatient appropriate because: Of ongoing management of severe hyponatremia, UTI.   Dispo: The patient is from: Home              Anticipated d/c is to: SNF versus home with home health services              Patient currently is not medically stable to d/c.   Difficult to place patient No  Consultants:see note  Procedures:see note  Unresulted Labs (From admission, onward)         None     Culture/Microbiology    Component Value Date/Time   SDES BLOOD FOOT 11/03/2020 1139   SDES IN/OUT CATH URINE 11/03/2020 1139   SPECREQUEST  11/03/2020 1139    BOTTLES DRAWN AEROBIC AND ANAEROBIC Blood Culture adequate  volume   SPECREQUEST  11/03/2020 1139    NONE Performed at [facility], [address] [place], [place] [address]    CULT  11/03/2020 1139    NO GROWTH 5 DAYS Performed at [facility], [address], [place], [place] [address]    CULT >=100,000 COLONIES/mL PSEUDOMONAS AERUGINOSA (A) 11/03/2020 1139   REPTSTATUS 11/08/2020 FINAL 11/03/2020 1139   REPTSTATUS 11/05/2020 FINAL 11/03/2020 1139    Other culture-see note  Medications: Scheduled Meds: . acetaminophen  650 mg Oral Q6H  . apixaban  5 mg Per Tube BID  . bethanechol  50 mg Per Tube BID  . calcium-vitamin D  1 tablet Per Tube BID  . chlorhexidine  15 mL Mouth Rinse BID  . Chlorhexidine Gluconate Cloth  6 each Topical Daily  . clonazePAM  0.5 mg Per Tube QHS  . feeding supplement (PROSource TF)  45 mL Per Tube Daily  . folic acid  0.5 mg Per Tube Daily  . levothyroxine  50 mcg Per Tube QAC breakfast  . lidocaine      . mouth rinse  15 mL Mouth Rinse q12n4p  . QUEtiapine  12.5 mg Per Tube QHS   Continuous Infusions: . sodium chloride Stopped (11/09/20 1246)  . ceFEPime (MAXIPIME) IV 2 g (11/09/20 1242)  .  feeding supplement (OSMOLITE 1.5 CAL) 1,000 mL (11/09/20 1135)    Antimicrobials: Anti-infectives (From admission, onward)   Start     Dose/Rate Route Frequency Ordered Stop   11/04/20 1300  ceFEPIme (MAXIPIME) 2 g in sodium chloride 0.9 % 100 mL IVPB        2 g 200 mL/hr over 30 Minutes Intravenous Every 24 hours 11/04/20 1138       Objective: Vitals: Today's Vitals   11/08/20 2315 11/09/20 0409 11/09/20 0855 11/09/20 1210  BP: 136/63 133/73 (!) 148/69 (!) 144/73  Pulse: 68 80 68 (!) 59  Resp: 19 20 18 14   Temp: 98.2 F (36.8 C) 98.5 F (36.9 C) 97.9 F (36.6 C) (!) 97.4 F (36.3 C)  TempSrc: Oral Oral Oral Oral  SpO2: 98% 99% 99% 100%  Weight:      Height:      PainSc: Asleep 0-No pain      Intake/Output Summary (Last 24 hours) at 11/09/2020 1530 Last data filed at 11/09/2020  1214 Gross per 24 hour  Intake 0 ml  Output 1000 ml  Net -1000 ml   Filed Weights   11/03/20 1729  Weight: 83 kg   Weight change:   Intake/Output from previous day: 03/01 0701 - 03/02 0700 In: 564.6 [I.V.:564.6] Out: 500 [Urine:500] Intake/Output this shift: Total I/O In: -  Out: 500 [Urine:500] Filed Weights   11/03/20 1729  Weight: 83 kg   Examination:  General exam: Chronically ill elderly female in bed, awake alert oriented to self and partly to place only, significant cognitive deficits noted CVS: S1-S2, regular rate rhythm Lungs: Decreased breath sounds bilaterally Abdomen: Soft, nontender, PEG tube noted, bowel sounds present Extremities: Trace edema, contracted bilateral extremities   Data Reviewed: I have personally reviewed following labs and imaging studies CBC: Recent Labs  Lab 11/03/20 1150 11/03/20 1438 11/05/20 0439 11/06/20 0555 11/07/20 0119 11/08/20 0646  WBC 5.3  --  3.9* 5.4 7.7 6.7  NEUTROABS 4.4  --   --   --   --   --   HGB 10.5* 8.8* 8.8* 8.8* 9.1* 8.0*  HCT 29.9* 26.0* 24.8* 25.9* 25.7* 23.8*  MCV 88.5  --  89.2 90.9 90.5 94.4  PLT 122*  --  111* 125* 123* 101*   Basic Metabolic Panel: Recent Labs  Lab 11/03/20 1415 11/03/20 1438 11/03/20 1908 11/03/20 2131 11/04/20 0451 11/04/20 0738 11/05/20 0439 11/05/20 5621 11/05/20 1847 11/06/20 0021 11/06/20 0555 11/07/20 0119 11/08/20 0646  NA 111*   < > 113*   < > 118*   < > 123*   < > 128* 127* 130* 130* 130*  K 5.6*   < > 5.8*   < > 4.1  --  3.8  --   --   --  4.2 4.4 4.1  CL 84*   < > 87*   < > 89*  --  96*  --   --   --  101 101 101  CO2 20*  --  17*   < > 20*  --  20*  --   --   --  21* 20* 22  GLUCOSE 124*   < > 116*   < > 97  --  145*  --   --   --  131* 108* 85  BUN 26*   < > 27*   < > 23  --  16  --   --   --  12 10 12   CREATININE 0.58   < >  0.54   < > 0.52  --  0.43*  --   --   --  0.42* 0.39* 0.41*  CALCIUM 8.6*  --  9.1   < > 8.9  --  8.8*  --   --   --  8.9 8.9 8.3*   MG  --   --   --   --   --   --   --   --   --   --   --   --  1.2*  PHOS 3.4  --  3.3  --   --   --   --   --   --   --   --   --   --    < > = values in this interval not displayed.   GFR: Estimated Creatinine Clearance: 58 mL/min (A) (by C-G formula based on SCr of 0.41 mg/dL (L)). Liver Function Tests: Recent Labs  Lab 11/03/20 1150 11/03/20 1415 11/03/20 1908 11/08/20 0646  AST 18  --   --  13*  ALT 10  --   --  10  ALKPHOS 70  --   --  49  BILITOT 1.2  --   --  1.0  PROT 5.3*  --   --  4.2*  ALBUMIN 3.0* 2.7* 2.9* 2.1*   Recent Labs  Lab 11/03/20 1150  LIPASE 40   Recent Labs  Lab 11/08/20 0839  AMMONIA 39*   Coagulation Profile: Recent Labs  Lab 11/03/20 1150  INR 1.5*   Cardiac Enzymes: No results for input(s): CKTOTAL, CKMB, CKMBINDEX, TROPONINI in the last 168 hours. BNP (last 3 results) No results for input(s): PROBNP in the last 8760 hours. HbA1C: No results for input(s): HGBA1C in the last 72 hours. CBG: Recent Labs  Lab 11/05/20 0340 11/05/20 1215 11/07/20 0954 11/08/20 0542 11/09/20 0542  GLUCAP 129* 133* 112* 86 78   Lipid Profile: No results for input(s): CHOL, HDL, LDLCALC, TRIG, CHOLHDL, LDLDIRECT in the last 72 hours. Thyroid Function Tests: No results for input(s): TSH, T4TOTAL, FREET4, T3FREE, THYROIDAB in the last 72 hours. Anemia Panel: No results for input(s): VITAMINB12, FOLATE, FERRITIN, TIBC, IRON, RETICCTPCT in the last 72 hours. Sepsis Labs: Recent Labs  Lab 11/04/20 1150 11/04/20 1440 11/04/20 1931 11/04/20 2259  LATICACIDVEN 0.8 0.9 0.8 1.1    Recent Results (from the past 240 hour(s))  Blood culture (routine single)     Status: None   Collection Time: 11/03/20 11:39 AM   Specimen: BLOOD  Result Value Ref Range Status   Specimen Description BLOOD FOOT  Final   Special Requests   Final    BOTTLES DRAWN AEROBIC AND ANAEROBIC Blood Culture adequate volume   Culture   Final    NO GROWTH 5 DAYS Performed at  [facility], [address], [place], [place] [address]    Report Status 11/08/2020 FINAL  Final  Urine culture     Status: Abnormal   Collection Time: 11/03/20 11:39 AM   Specimen: In/Out Cath Urine  Result Value Ref Range Status   Specimen Description IN/OUT CATH URINE  Final   Special Requests   Final    NONE Performed at [facility], [address], [place], [place] [address]    Culture >=100,000 COLONIES/mL PSEUDOMONAS AERUGINOSA (A)  Final   Report Status 11/05/2020 FINAL  Final   Organism ID, Bacteria PSEUDOMONAS AERUGINOSA (A)  Final      Susceptibility   Pseudomonas  aeruginosa - MIC*    CEFTAZIDIME 4 SENSITIVE Sensitive     CIPROFLOXACIN <=0.25 SENSITIVE Sensitive     GENTAMICIN <=1 SENSITIVE Sensitive     IMIPENEM 2 SENSITIVE Sensitive     PIP/TAZO 8 SENSITIVE Sensitive     CEFEPIME 2 SENSITIVE Sensitive     * >=100,000 COLONIES/mL PSEUDOMONAS AERUGINOSA  Resp Panel by RT-PCR (Flu A&B, Covid) Urine, Catheterized     Status: None   Collection Time: 11/03/20 11:28 PM   Specimen: Urine, Catheterized; Nasopharyngeal(NP) swabs in vial transport medium  Result Value Ref Range Status   SARS Coronavirus 2 by RT PCR NEGATIVE NEGATIVE Final    Comment: (NOTE) SARS-CoV-2 target nucleic acids are NOT DETECTED.  The SARS-CoV-2 RNA is generally detectable in upper respiratory specimens during the acute phase of infection. The lowest concentration of SARS-CoV-2 viral copies this assay can detect is 138 copies/mL. A negative result does not preclude SARS-Cov-2 infection and should not be used as the sole basis for treatment or other patient management decisions. A negative result may occur with  improper specimen collection/handling, submission of specimen other than nasopharyngeal swab, presence of viral mutation(s) within the areas targeted by this assay, and inadequate number of viral copies(<138 copies/mL). A negative result must be combined with clinical  observations, patient history, and epidemiological information. The expected result is Negative.  Fact Sheet for Patients:  BloggerCourse.com  Fact Sheet for Healthcare Providers:  SeriousBroker.it  This test is no t yet approved or cleared by the [place] FDA and  has been authorized for detection and/or diagnosis of SARS-CoV-2 by FDA under an Emergency Use Authorization (EUA). This EUA will remain  in effect (meaning this test can be used) for the duration of the COVID-19 declaration under Section 564(b)(1) of the Act, 21 U.S.C.section 360bbb-3(b)(1), unless the authorization is terminated  or revoked sooner.       Influenza A by PCR NEGATIVE NEGATIVE Final   Influenza B by PCR NEGATIVE NEGATIVE Final    Comment: (NOTE) The Xpert Xpress SARS-CoV-2/FLU/RSV plus assay is intended as an aid in the diagnosis of influenza from Nasopharyngeal swab specimens and should not be used as a sole basis for treatment. Nasal washings and aspirates are unacceptable for Xpert Xpress SARS-CoV-2/FLU/RSV testing.  Fact Sheet for Patients: BloggerCourse.com  Fact Sheet for Healthcare Providers: SeriousBroker.it  This test is not yet approved or cleared by the [place] FDA and has been authorized for detection and/or diagnosis of SARS-CoV-2 by FDA under an Emergency Use Authorization (EUA). This EUA will remain in effect (meaning this test can be used) for the duration of the COVID-19 declaration under Section 564(b)(1) of the Act, 21 U.S.C. section 360bbb-3(b)(1), unless the authorization is terminated or revoked.  Performed at [facility], [address], [place], [place] [address]   MRSA PCR Screening     Status: None   Collection Time: 11/03/20 11:35 PM   Specimen: Urine, Catheterized; Nasopharyngeal  Result Value Ref Range Status   MRSA by PCR NEGATIVE NEGATIVE Final     Comment:        The GeneXpert MRSA Assay (FDA approved for NASAL specimens only), is one component of a comprehensive MRSA colonization surveillance program. It is not intended to diagnose MRSA infection nor to guide or monitor treatment for MRSA infections. Performed at [facility], [address] [place], [place] [address]      Radiology Studies: CT HEAD WO CONTRAST  Result Date: 11/08/2020 CLINICAL DATA:  Mental status change, unknown cause. EXAM: CT HEAD WITHOUT CONTRAST TECHNIQUE: Contiguous axial images were obtained from the base of the skull through the vertex without intravenous contrast. COMPARISON:  Head CT 11/03/2020. Head CT 07/03/2018. Brain MRI 01/19/2016 FINDINGS: Brain: Stable cerebral and cerebellar atrophy. Redemonstrated extensive chronic cortical/subcortical infarction changes within the right cerebral hemisphere affecting the right MCA and ACA territories. Unchanged curvilinear focus of calcification within the infarction territory within the right frontal lobe. Ex vacuo dilatation of the right lateral ventricle. Wallerian degeneration within the right midbrain and pons. Stable background mild ill-defined hypoattenuation within the cerebral white matter which is nonspecific, but compatible with chronic small vessel ischemic disease. Redemonstrated chronic lacunar infarct within the right cerebellum. There is no acute intracranial hemorrhage. No acute demarcated cortical infarct. No extra-axial fluid collection. No evidence of intracranial mass. No midline shift. Vascular: No hyperdense vessel.  Atherosclerotic calcifications Skull: Normal. Negative for fracture or focal lesion. Sinuses/Orbits: Visualized orbits show no acute finding. Minimal bilateral ethmoid sinus mucosal thickening. Small right mastoid effusion IMPRESSION: No evidence of acute intracranial abnormality. Redemonstrated extensive chronic infarction changes within the right cerebral hemisphere within the  right MCA and ACA vascular territories. Stable background generalized parenchymal atrophy and chronic small vessel ischemic disease. Redemonstrated chronic lacunar infarct within the right cerebellum. Mild ethmoid sinus mucosal thickening. Right mastoid effusion. Electronically Signed   By: Jackey Loge DO   On: 11/08/2020 10:10   IR GJ Tube Change  Result Date: 11/09/2020 INDICATION: Chronic pull-through gastrostomy tube, now with concern for malfunctioning. Please perform fluoroscopic guided exchange. EXAM: FLUOROSCOPIC GUIDED REPLACEMENT OF GASTROSTOMY TUBE COMPARISON:  None. MEDICATIONS: None. CONTRAST:  15mL OMNIPAQUE IOHEXOL 300 MG/ML SOLN administered into the gastric lumen FLUOROSCOPY TIME:  12 seconds COMPLICATIONS: None immediate. PROCEDURE: Informed written consent was obtained from the patient after a discussion of the risks, benefits and alternatives to treatment. Questions regarding the procedure were encouraged and answered. A timeout was performed prior to the initiation of the procedure. The upper abdomen and external portion of the existing gastrostomy tube was prepped and draped in the usual sterile fashion, and a sterile drape was applied covering the operative field. Maximum barrier sterile technique with sterile gowns and gloves were used for the procedure. A timeout was performed prior to the initiation of the procedure. The existing gastrostomy tube was injected with a small amount of contrast confirming appropriate positioning within the gastric lumen. Utilizing manual contraction, the chronic pull-through gastrostomy tube was removed intact. Next, a 74 French balloon inflatable gastrostomy tube was inserted through the chronic gastrostomy track. Balloon was insufflated with approximately 10 cc of saline and dilute contrast and pulled against the anterior inner aspect of the gastric lumen. The external disc was cinched. Contrast was injected and a postprocedural spot fluoroscopic image  was obtained demonstrating appropriate position functionality. A dressing was applied. The patient tolerated the procedure well without immediate postprocedural complication. IMPRESSION: Successful fluoroscopic guided replacement of a new 18-French balloon retention gastrostomy tube. The new gastrostomy tube is ready for immediate use. Electronically Signed   By: Simonne Come M.D.   On: 11/09/2020 08:55   DG CHEST PORT 1 VIEW  Result Date: 11/08/2020 CLINICAL DATA:  Hypoxia EXAM: PORTABLE CHEST 1 VIEW COMPARISON:  November 03, 2020 FINDINGS: The heart size and mediastinal contours are mildly enlarged. Aortic knob calcifications are seen. No large airspace consolidation or pleural effusion. No acute osseous abnormality. IMPRESSION: No active disease. Electronically Signed   By: Heywood Bene.D.  On: 11/08/2020 00:40     LOS: 6 days   Zannie Cove, MD [facility]  11/09/2020, 3:30 PM

## 2020-11-09 NOTE — Procedures (Signed)
Pre procedural Dx: Dysphagia, poorly functioning feeding tube. Post procedural Dx: Same  Successful fluoroscopic guided replacement of 18 Fr now balloon retention gastrostomy tube.   The feeding tube is ready for immediate use.  EBL: None Complications: None immediate.  Ronny Bacon, MD Pager #: [phone]

## 2020-11-10 DIAGNOSIS — E871 Hypo-osmolality and hyponatremia: Secondary | ICD-10-CM | POA: Diagnosis not present

## 2020-11-10 DIAGNOSIS — Z431 Encounter for attention to gastrostomy: Secondary | ICD-10-CM

## 2020-11-10 DIAGNOSIS — I959 Hypotension, unspecified: Secondary | ICD-10-CM

## 2020-11-10 LAB — CBC
HCT: 24 % — ABNORMAL LOW (ref 36.0–46.0)
Hemoglobin: 8 g/dL — ABNORMAL LOW (ref 12.0–15.0)
MCH: 31.3 pg (ref 26.0–34.0)
MCHC: 33.3 g/dL (ref 30.0–36.0)
MCV: 93.8 fL (ref 80.0–100.0)
Platelets: 107 10*3/uL — ABNORMAL LOW (ref 150–400)
RBC: 2.56 MIL/uL — ABNORMAL LOW (ref 3.87–5.11)
RDW: 12.9 % (ref 11.5–15.5)
WBC: 5.1 10*3/uL (ref 4.0–10.5)
nRBC: 0 % (ref 0.0–0.2)

## 2020-11-10 LAB — BASIC METABOLIC PANEL
Anion gap: 7 (ref 5–15)
BUN: 10 mg/dL (ref 8–23)
CO2: 20 mmol/L — ABNORMAL LOW (ref 22–32)
Calcium: 8.3 mg/dL — ABNORMAL LOW (ref 8.9–10.3)
Chloride: 107 mmol/L (ref 98–111)
Creatinine, Ser: 0.4 mg/dL — ABNORMAL LOW (ref 0.44–1.00)
GFR, Estimated: >60 mL/min (ref >60)
Glucose, Bld: 140 mg/dL — ABNORMAL HIGH (ref 70–99)
Potassium: 3.6 mmol/L (ref 3.5–5.1)
Sodium: 134 mmol/L — ABNORMAL LOW (ref 135–145)

## 2020-11-10 LAB — GLUCOSE, CAPILLARY
Glucose-Capillary: 130 mg/dL — ABNORMAL HIGH (ref 70–99)
Glucose-Capillary: 124 mg/dL — ABNORMAL HIGH (ref 70–99)

## 2020-11-10 MED ORDER — MAGNESIUM SULFATE 2 GM/50ML IV SOLN
2.0000 g | Freq: Once | INTRAVENOUS | Status: AC
  Administered 2020-11-10: 2 g via INTRAVENOUS
  Filled 2020-11-10: qty 50

## 2020-11-10 NOTE — Progress Notes (Signed)
Nutrition Follow Up  DOCUMENTATION CODES:   Severe malnutrition in context of chronic illness  INTERVENTION:   Continue tube feeds via G-tube: - Osmolite 1.5 @ 50 ml/hr (1200 ml/day) - ProSource TF 45 ml daily - Free water flushes 200 ml Q6  Tube feeding regimen at goal provides 1840 kcal, 86 grams of protein, and 914 ml of H2O (1714 ml with flushes)  NUTRITION DIAGNOSIS:   Severe Malnutrition related to chronic illness (chronic dysphagia) as evidenced by severe fat depletion,severe muscle depletion.  Ongoing  GOAL:   Patient will meet greater than or equal to 90% of their needs   Meeting with TF  MONITOR:   Diet advancement,Labs,Weight trends,TF tolerance,Skin  REASON FOR ASSESSMENT:   Consult Enteral/tube feeding initiation and management  ASSESSMENT:   87 year old female who presented to the ED on 2/24 with vomiting. PMH of stroke with residual left-sided weakness, chronic dysphagia s/p G-tube, HTN, chronic urinary retention s/p Foley catheter, hypothyroidism. Pt admitted with symptomatic hyponatremia with acute metabolic encephalopathy.   Per SLP patient able to have pureed snacks from the floor. Plan MBS in near future. Tolerating Osmolite 1.5 @ 50 ml/hr + ProSource. Follow for diet advancement. May be able to transition to nocturnal or bolus feedings if she starts eating during the day.   Admission weight: 83 kg (stated) No current weight obtained   UOP: 650 ml x 24 hrs   Medications: calcium-vitamin D, folic acid Labs: Na 025 (L) Mg 1.2 (L) CBG 76-130  Diet Order:   Diet Order            Diet NPO time specified  Diet effective now                 EDUCATION NEEDS:   No education needs have been identified at this time  Skin:  Skin Assessment: Skin Integrity Issues: Stage II: right buttocks, left buttocks  Last BM:  2/25  Height:   Ht Readings from Last 1 Encounters:  11/03/20 5\' 7"  (1.702 m)    Weight:   Wt Readings from Last 1  Encounters:  11/03/20 83 kg    BMI:  Body mass index is 28.66 kg/m.  Estimated Nutritional Needs:   Kcal:  1650-1850  Protein:  80-95 grams  Fluid:  1.6-1.8 L  Mariana Single RD, LDN Clinical Nutrition Pager listed in [place]

## 2020-11-10 NOTE — Progress Notes (Signed)
Occupational Therapy Treatment Patient Details Name: [name] MRN: [id number] DOB: 03-25-1934 Today's Date: 11/10/2020    History of present illness 87 y.o. female with medical history significant of stroke with residual left-sided weakness on Eliquis since, chronic dysphagia s/p G tube, HTN, chronic urinary retention status post Foley catheter (since November 2021), hypothyroidism, presented with change of mentation. In ED found to have sodium level of 110 and UTI Admitted 2/24/2 for treatment of symptomatic hyponatremia with acute metabolic encephalopathy and chronic urinary retention post Foley indwelling, 11/09/20 had G-tube replacment due to blockage.   OT comments  Pt making steady progress towards OT goals this session. Session focus on positioning needs and light ROM stretches to BUEs. Applied palm guard to L hand to protect skin integrity of palm as pt with flexion contracture in L wrist and digits ( from CVA in 2017). OT continue to follow to assess splint needs ( attempted to call pts family member to inquire if pt already has L resting hand splint with no response). Pt would continue to benefit from acute OT to facilitate family education and address positioning needs. Will continue to follow acutely per POC.    Follow Up Recommendations  Home health OT;Supervision/Assistance - 24 hour    Equipment Recommendations  None recommended by OT    Recommendations for Other Services      Precautions / Restrictions Precautions Precautions: Other (comment) Precaution Comments: G tube, L hemi weakness/tone Required Braces or Orthoses:  (palm guard to L hand) Restrictions Weight Bearing Restrictions: No       Mobility Bed Mobility Overal bed mobility: Needs Assistance Bed Mobility: Rolling Rolling: Total assist;+2 for physical assistance         General bed mobility comments: totalA +2 for rolling B directions for repositioning    Transfers                       Balance                                           ADL either performed or assessed with clinical judgement   ADL Overall ADL's : At baseline;Needs assistance/impaired                                     Functional mobility during ADLs: Total assistance;+2 for physical assistance (rolling for repositioning) General ADL Comments: pt currently total A for all ADLS     Vision Baseline Vision/History: Wears glasses Wears Glasses: At all times Patient Visual Report: Other (comment) Additional Comments: L neglect noted   Perception     Praxis      Cognition Arousal/Alertness: Awake/alert Behavior During Therapy: Flat affect Overall Cognitive Status: Impaired/Different from baseline Area of Impairment: Orientation;Attention;Memory;Following commands;Safety/judgement;Awareness                 Orientation Level: Disoriented to;Place;Time;Situation (when options provided pt states shes at "school") Current Attention Level: Selective Memory: Decreased short-term memory Following Commands: Follows one step commands with increased time;Follows one step commands inconsistently Safety/Judgement: Decreased awareness of deficits Awareness: Intellectual Problem Solving: Slow processing;Decreased initiation;Difficulty sequencing;Requires verbal cues;Requires tactile cues General Comments: pt seems more confused this session stating she is at school when  3 options provided ( school, hospital or church), speaking mostly nonsensically but  when pt does respond appropriate repsonses are delayed        Exercises General Exercises - Upper Extremity Elbow Flexion: AAROM;Both;10 reps;Supine;PROM (PROM on L, AA on R) Elbow Extension: AAROM;Both;10 reps;Supine;PROM (PROM on L, AA on R) Wrist Flexion: AAROM;Both;10 reps;Supine;PROM (PROM on L, AA on R) Wrist Extension: AAROM;Both;10 reps;Supine;PROM (PROM on L, AA on R) Digit Composite Flexion:  AAROM;Both;10 reps;Supine;PROM (PROM on L, AA on R) Composite Extension: AAROM;Both;10 reps;Supine;PROM (PROM on L, AA on R) General Exercises - Lower Extremity Ankle Circles/Pumps: AAROM;PROM;Both;10 reps;Supine (PROM on L, AA on R)   Shoulder Instructions       General Comments donned L palm protector to prevent skin breakdown( Rn aware to leave on during day and doff once a shift to check for skin breakdown), called pts son ( with no response) to try to determine if pt already has a L resting hand splint as pt would really benefit from prefab splint for positioning needs. Also ordered Prevalons for positioning in bed.    Pertinent Vitals/ Pain       Pain Assessment: Faces Faces Pain Scale: Hurts even more Pain Location: R UE with P/AAROM Pain Descriptors / Indicators: Grimacing;Guarding;Moaning;Discomfort Pain Intervention(s): Limited activity within patient's tolerance;Monitored during session;Repositioned  Home Living                                          Prior Functioning/Environment              Frequency  Min 1X/week        Progress Toward Goals  OT Goals(current goals can now be found in the care plan section)  Progress towards OT goals: Progressing toward goals  Acute Rehab OT Goals Patient Stated Goal: none stated Time For Goal Achievement: 11/22/20 Potential to Achieve Goals: Fair ADL Goals Pt/caregiver will Perform Home Exercise Program: Both right and left upper extremity;With minimal assist;With written HEP provided Additional ADL Goal #1: Pt will demonstrate activity tolerance for 3 UB grooming activities, implementing energy conservation strategies as needed  Plan Discharge plan remains appropriate;Frequency remains appropriate    Co-evaluation                 AM-PAC OT "6 Clicks" Daily Activity     Outcome Measure   Help from another person eating meals?: Total Help from another person taking care of personal  grooming?: Total Help from another person toileting, which includes using toliet, bedpan, or urinal?: Total Help from another person bathing (including washing, rinsing, drying)?: Total Help from another person to put on and taking off regular upper body clothing?: Total Help from another person to put on and taking off regular lower body clothing?: Total 6 Click Score: 6    End of Session Equipment Utilized During Treatment: Other (comment) (palm guard for L hand)  OT Visit Diagnosis: Muscle weakness (generalized) (M62.81)   Activity Tolerance Patient limited by lethargy   Patient Left in bed;with call bell/phone within reach;with bed alarm set   Nurse Communication Mobility status        Time: 1610-9604 OT Time Calculation (min): 42 min  Charges: OT General Charges $OT Visit: 1 Visit OT Treatments $Therapeutic Activity: 38-52 mins  Lenor Derrick., COTA/L Acute Rehabilitation Services [phone]    Barron Schmid 11/10/2020, 11:43 AM

## 2020-11-10 NOTE — Progress Notes (Signed)
Speech Language Pathology Treatment: Dysphagia  Patient Details Name: [name] MRN: [id number] DOB: Apr 05, 1934 Today's Date: 11/10/2020 Time: 4098-1191 SLP Time Calculation (min) (ACUTE ONLY): 14 min  Assessment / Plan / Recommendation Clinical Impression  Pt is very alert again this morning, but continues to present with consistent, immediate coughing that follows all liquid consistencies. Pt says that this happens "all the time" when drinking liquids at home. A single, delayed throat clear was noted with purees as well as mildly delayed oral transit, but otherwise without incidence. Recommend continuing snacks of puree from the floor stock when alert and proceeding with MBS as able to be scheduled. Pt does also have PEG tube for use in the interim.    HPI HPI: Pt is an 87 yo female presenting with AMS and recent vomiting. Pt was evaluated for swallowing in 2017 with mild oral weakness for which she compensated with Mod I. Regular solids and thin liquids were recommended. In 2019 she was seen at OSH with more severe oropharyngeal dysphagia and NPO was recommended.  PMH also includes: CVA with residual L-sided weakness, chronic dysphagia s/p G-tube (able to eat solid food but not enough liquids per MD H&P note), HTN, chronic urinary retention s/p foley catheter, hypothyroidism      SLP Plan  MBS       Recommendations  Diet recommendations: NPO;Other(comment) (snacks of puree from floor stock) Medication Administration: Via alternative means                Oral Care Recommendations: Oral care QID Follow up Recommendations: Home health SLP;24 hour supervision/assistance SLP Visit Diagnosis: Dysphagia, unspecified (R13.10) Plan: MBS       GO                Mahala Menghini., M.A. CCC-SLP Acute Rehabilitation Services Pager [phone] Office [phone]  11/10/2020, 9:37 AM

## 2020-11-10 NOTE — Discharge Instructions (Signed)

## 2020-11-10 NOTE — TOC Initial Note (Signed)
Transition of Care (TOC) - Initial/Assessment Note  Donn Pierini RN, BSN Transitions of Care Unit 4E- RN Case Manager See Treatment Team for direct phone #    Patient Details  Name: [name] MRN: [id number] Date of Birth: 31-Oct-1933  Transition of Care [facility]) CM/SW Contact:    Darrold Span, RN Phone Number: 11/10/2020, 4:08 PM  Clinical Narrative:                 Pt admitted from home, lives with son. Bed bound, has PEG. IR has exchanged PEG tube on this admit.  Spoke with son via TC- who confirmed that pt had outpt PC services with [facility])- per son he has all needed DME- only thing pt would need is feeding pump if plan is for continuous feeds at home. Son reports she was not on TF prior to admit. Will f/u with MD regarding plans for TF on discharge.  Discussed with son HH needs and choice for [facility] agencies- noted pt has been with [facility] in the past. Per son he would prefer to use [facility] for any [facility] needs at this time. Will request HH orders from MD and make referral to [facility] as per son request.   Son requesting non emergent EMS/stretcher transport home. Will need GOLD DNR for transport.   Msg left for [facility] to confirm outpt PC services.   Expected Discharge Plan: Skilled Nursing Facility Barriers to Discharge: Continued Medical Work up   Patient Goals and CMS Choice Patient states their goals for this hospitalization and ongoing recovery are:: return home CMS Medicare.gov Compare Post Acute Care list provided to:: Patient Represenative (must comment) Choice offered to / list presented to : Adult Children  Expected Discharge Plan and Services Expected Discharge Plan: Skilled Nursing Facility In-house Referral: Clinical Social Work Discharge Planning Services: CM Consult Post Acute Care Choice: Durable Medical Equipment,Home Health Living arrangements for the past 2 months:  Single Family Home                             HH Agency: [facility]        Prior Living Arrangements/Services Living arrangements for the past 2 months: Single Family Home Lives with:: Adult Children Patient language and need for interpreter reviewed:: Yes Do you feel safe going back to the place where you live?: Yes      Need for Family Participation in Patient Care: Yes (Comment) Care giver support system in place?: Yes (comment) Current home services: DME,Homehealth aide,Other (comment) (Private Duty caregivers) Criminal Activity/Legal Involvement Pertinent to Current Situation/Hospitalization: No - Comment as needed  Activities of Daily Living      Permission Sought/Granted Permission sought to share information with : Facility Industrial/product designer granted to share information with : Yes, Verbal Permission Granted     Permission granted to share info w AGENCY: HH/PC/DME        Emotional Assessment Appearance:: Appears stated age Attitude/Demeanor/Rapport: Unable to Assess Affect (typically observed): Unable to Assess Orientation: : Oriented to Self Alcohol / Substance Use: Not Applicable Psych Involvement: No (comment)  Admission diagnosis:  Hyponatremia [E87.1] Patient Active Problem List   Diagnosis Date Noted  . Pressure injury of skin 11/04/2020  . Protein-calorie malnutrition, severe 11/04/2020  . Hyponatremia 11/03/2020  . Acute ischemic right MCA stroke (HCC) 01/23/2016  . Gait disturbance, post-stroke   .  Hemiparesis (HCC)   . Essential hypertension   . Dyslipidemia   . Thyroid activity decreased   . E-coli UTI   . Dyspepsia   . Dehydration   . Acute blood loss anemia   . Intracranial vascular stenosis   . Ischemic stroke (HCC) 01/19/2016  . Recurrent UTI 01/19/2016  . HLD (hyperlipidemia) 01/19/2016  . Intertrochanteric fracture of right hip (HCC) 03/23/2014  . HTN (hypertension) 03/23/2014  .  Hypothyroidism 03/23/2014  . Anemia 03/23/2014  . Thrombocytopenia, unspecified (HCC) 03/23/2014  . Closed right hip fracture (HCC) 03/23/2014  . Pseudoaneurysm of right femoral artery (HCC) 01/31/2014  . Pseudoaneurysm of femoral artery (HCC) 01/30/2014  . Splenic artery aneurysm (HCC) 12/04/2013   PCP:  Paulina Fusi, MD Pharmacy:   [facility] [phone] Rosalita Levan,  - [address] AT [facility] [address] [place] [address] Phone: [phone] Fax: [phone]     Social Determinants of Health (SDOH) Interventions    Readmission Risk Interventions No flowsheet data found.

## 2020-11-10 NOTE — Progress Notes (Signed)
Daily Progress Note   Patient Name: [name]       Date: 11/10/2020 DOB: [birth year]  Age: 87 y.o. MRN#: [id number] Attending Physician: Zannie Cove, MD Primary Care Physician: Paulina Fusi, MD Admit Date: 11/03/2020  Reason for Consultation/Follow-up: Establishing goals of care  Subjective: Received message from attending that patient's son had requested Hospice care at home. This was not in line with previous discussions and patient's expressed goals of care. (Please see previous Palliative notes and advanced care planning documents).  Wilfrid Lund for followup.  Patient was having visits from [facility] and they were doing her foley catheter exchanges. He was informed by [facility] that they would no longer do catheter exchanges and they were going to refer her to Hospice for catheter care.  I discussed Hospice philosophy with Tresa Endo. Tresa Endo expressed that patient's wishes and decisions regarding future care planning were not in line with Hospice philosophy.   ROS  Length of Stay: 7  Current Medications: Scheduled Meds:  . acetaminophen (TYLENOL) oral liquid 160 mg/5 mL  650 mg Per Tube Q6H  . apixaban  5 mg Per Tube BID  . bethanechol  50 mg Per Tube BID  . calcium-vitamin D  1 tablet Per Tube BID  . chlorhexidine  15 mL Mouth Rinse BID  . Chlorhexidine Gluconate Cloth  6 each Topical Daily  . clonazePAM  0.5 mg Per Tube QHS  . feeding supplement (PROSource TF)  45 mL Per Tube Daily  . folic acid  0.5 mg Per Tube Daily  . free water  200 mL Per Tube Q6H  . levothyroxine  50 mcg Per Tube QAC breakfast  . mouth rinse  15 mL Mouth Rinse q12n4p  . QUEtiapine  12.5 mg Per Tube QHS    Continuous Infusions: . feeding supplement (OSMOLITE 1.5 CAL) 1,000 mL  (11/09/20 1800)    PRN Meds: acetaminophen **OR** acetaminophen, hydrALAZINE, lidocaine, ondansetron **OR** ondansetron (ZOFRAN) IV, polyethylene glycol  Physical Exam Neurological:     Mental Status: She is alert. Mental status is at baseline.             Vital Signs: BP 134/66 (BP Location: Left Arm)   Pulse 69   Temp 98.7 F (37.1 C) (Axillary)   Resp 14   Ht 5\' 7"  (1.702 m)  Wt 83 kg   SpO2 95%   BMI 28.66 kg/m  SpO2: SpO2: 95 % O2 Device: O2 Device: Nasal Cannula O2 Flow Rate: O2 Flow Rate (L/min): 2 L/min  Intake/output summary:   Intake/Output Summary (Last 24 hours) at 11/10/2020 1354 Last data filed at 11/09/2020 2157 Gross per 24 hour  Intake 200 ml  Output 150 ml  Net 50 ml   LBM: Last BM Date: 11/04/20 Baseline Weight: Weight: 83 kg Most recent weight: Weight: 83 kg       Palliative Assessment/Data: PPS: 30%      Patient Active Problem List   Diagnosis Date Noted  . Pressure injury of skin 11/04/2020  . Protein-calorie malnutrition, severe 11/04/2020  . Hyponatremia 11/03/2020  . Acute ischemic right MCA stroke (HCC) 01/23/2016  . Gait disturbance, post-stroke   . Hemiparesis (HCC)   . Essential hypertension   . Dyslipidemia   . Thyroid activity decreased   . E-coli UTI   . Dyspepsia   . Dehydration   . Acute blood loss anemia   . Intracranial vascular stenosis   . Ischemic stroke (HCC) 01/19/2016  . Recurrent UTI 01/19/2016  . HLD (hyperlipidemia) 01/19/2016  . Intertrochanteric fracture of right hip (HCC) 03/23/2014  . HTN (hypertension) 03/23/2014  . Hypothyroidism 03/23/2014  . Anemia 03/23/2014  . Thrombocytopenia, unspecified (HCC) 03/23/2014  . Closed right hip fracture (HCC) 03/23/2014  . Pseudoaneurysm of right femoral artery (HCC) 01/31/2014  . Pseudoaneurysm of femoral artery (HCC) 01/30/2014  . Splenic artery aneurysm (HCC) 12/04/2013    Palliative Care Assessment & Plan   Patient Profile: [name] a 87  y.o.femalewith history of HTN, stroke with residual left-sided weakness, gastric feeding tube, chronic urinary retention on indwelling Foley catheter, hypothyroidism who was presenteddue tovomiting, decreased oral intake and increased coughing,presented with a mental status change.  Palliative care has been consulted for goals of care given [name]'s chronically debilitated state.  Assessment/Recommendations/Plan   Referral to [facility] for outpatient Palliative- she will also need home health referral for foley catheter changes  GOC continue to be as previously expressed- continue life sustaining measures, treat reversible illnesses- IV fluids, rehospitalization and other necessary IV medications as needed  Goals of Care and Additional Recommendations:  Limitations on Scope of Treatment: Full Scope Treatment  Code Status:  DNR  Prognosis:   Unable to determine  Discharge Planning:  Home with Palliative Services  Care plan was discussed with patient's son.   Thank you for allowing the Palliative Medicine Team to assist in the care of this patient.   Total time: 38 minutes Greater than 50%  of this time was spent counseling and coordinating care related to the above assessment and plan.  Ocie Bob, AGNP-C Palliative Medicine   Please contact Palliative Medicine Team phone at [phone] for questions and concerns.

## 2020-11-10 NOTE — Progress Notes (Addendum)
PROGRESS NOTE    [name]  ZOX:[id number] DOB: Nov 28, 1933 DOA: 11/03/2020 PCP: Paulina Fusi, MD   Chief Complaint  Patient presents with  . Cough   Brief Narrative: 87 year old female with history of dementia, multiple strokes with left-sided weakness on Eliquis, chronic dysphagia with G-tube in place, hypertension, chronic urine retention status post Foley catheter since 2021 November, hypothyroidism, chronic debility patient able to communicate with family members uses peddled mini bike to ground, able to eat some solid food but usually not enough liquids which she consumes via G-tube, she was brought to the ED due to multiple episodes of vomiting, subsequently he also appeared more lethargic, has son switched her over to tube feeds eventually and then she developed worsening confusion. -She also has chronic urinary retention and had a Foley placed in November/21 which gets exchanged every 30 days recently treated with 5 days of Macrobid for presumed UTI 2 days prior to admission -In the ED she was noted to be hyponatremic with serum sodium of 110 she was admitted to the ICU, nephrology was consulted, on 2/24 she was started on hypertonic saline -Subsequently urine cultures grew Pseudomonas and was treated with cefepime -Seen by palliative care changed to DNR. BP stable patient transferred out of ICU Patient noted to have discoloration/food ?mold on PEG-IR consulted to eval for possible exchange, PEG tube was exchanged 3/2  Subjective: -Tolerating tube feeds, remains confused  Assessment & Plan:  Subacute hyponatremia, with mild hypovolemia due to GI loss and possibly low solute intake:   -Urine sod <10,TSH okay osmol 108,  -patient was transferred to ICU on hypertonic saline.Off 3% saline 2/25 AM.Sodium improved and holding stable at 130.  -Off IV fluids now, started on tube feeds and free water via NG tube -Patient is not to resume Lexapro or black elderberry per  nephrology -Sodium is stable at this time  Metabolic encephalopathy -At baseline has memory deficits, likely component of vascular dementia from multiple prior strokes, exacerbated in the setting of hyponatremia and long hospitalization -More stable now, caution with sedatives  Hypotension due to hypovolemia and UTI:  -Blood pressure is more stable however she has profound 3 third spacing, discontinued IV fluids and lisinopril   Pseudomonas UTI  - in the setting of chronic indwelling Foley catheter, -Foley was exchanged 2/22 -Completed 7 days of cefepime, will discontinue antibiotics today  Hyperkalemia resolved Non-anion gap metabolic acidosis resolved  Hx of ischemic Stroke with left-sided weakness on Eliquis.  At baseline able to tell her name and date of birth -at this time stable no new finding on CT head that also showed large chronic right MCA infarct, small chronic infarct in the right cerebellum and chronic small vessel ischemic changes.  Chronic dysphagia with G-tube in place since 01/03/2018, nursing noticed problem with G-tube question discoloration-mold/old food --IR consulted for possible PEG tube exchange -This was completed 3/2, resume tube feeds  -SLP following, plan for MBS   Hypothyroidism: On Synthroid  Anemia likely from chronic disease ferritin high, iron normal.  -Trend hemoglobin  Chronic debility with multiple complex comorbidities.Appreciate palliative input  Severe protein calorie malnutrition Nutrition Problem: Severe Malnutrition Etiology: chronic illness (chronic dysphagia) Signs/Symptoms: severe fat depletion,severe muscle depletion Interventions: Tube feeding,Prostat  Goals of care: Seen by palliative care patient is debilitated elderly multiple complex comorbidities, fluctuating mental status, intermittent periods of hypoxia and aspiration -Appreciate palliative care input, now DNR -Very poor prognosis -plan for hospice at home according to  son  Nutrition: Diet  Order            Diet NPO time specified  Diet effective now               Patient's Body mass index is 28.66 kg/m.  Pressure Ulcer: POA Pressure Injury 11/03/20 Buttocks Right Stage 2 -  Partial thickness loss of dermis presenting as a shallow open injury with a red, pink wound bed without slough. (Active)  11/03/20 2330  Location: Buttocks  Location Orientation: Right  Staging: Stage 2 -  Partial thickness loss of dermis presenting as a shallow open injury with a red, pink wound bed without slough.  Wound Description (Comments):   Present on Admission: Yes     Pressure Injury 11/03/20 Buttocks Left Stage 2 -  Partial thickness loss of dermis presenting as a shallow open injury with a red, pink wound bed without slough. (Active)  11/03/20 2330  Location: Buttocks  Location Orientation: Left  Staging: Stage 2 -  Partial thickness loss of dermis presenting as a shallow open injury with a red, pink wound bed without slough.  Wound Description (Comments):   Present on Admission: Yes    DVT prophylaxis: eliquis Code Status:   Code Status: DNR Family Communication: No family at bedside, was unable to reach her son Status is: Inpatient Remains inpatient appropriate because: Of ongoing management of severe hyponatremia, UTI.   Dispo: The patient is from: Home              Anticipated d/c is to: SNF versus home with home health services              Patient currently is not medically stable to d/c.   Difficult to place patient No  Consultants:see note  Procedures:see note  Unresulted Labs (From admission, onward)         None     Culture/Microbiology    Component Value Date/Time   SDES BLOOD FOOT 11/03/2020 1139   SDES IN/OUT CATH URINE 11/03/2020 1139   SPECREQUEST  11/03/2020 1139    BOTTLES DRAWN AEROBIC AND ANAEROBIC Blood Culture adequate volume   SPECREQUEST  11/03/2020 1139    NONE Performed at [facility], [address]  [place], [place] [address]    CULT  11/03/2020 1139    NO GROWTH 5 DAYS Performed at [facility], [address], [place], [place] [address]    CULT >=100,000 COLONIES/mL PSEUDOMONAS AERUGINOSA (A) 11/03/2020 1139   REPTSTATUS 11/08/2020 FINAL 11/03/2020 1139   REPTSTATUS 11/05/2020 FINAL 11/03/2020 1139    Other culture-see note  Medications: Scheduled Meds: . acetaminophen (TYLENOL) oral liquid 160 mg/5 mL  650 mg Per Tube Q6H  . apixaban  5 mg Per Tube BID  . bethanechol  50 mg Per Tube BID  . calcium-vitamin D  1 tablet Per Tube BID  . chlorhexidine  15 mL Mouth Rinse BID  . Chlorhexidine Gluconate Cloth  6 each Topical Daily  . clonazePAM  0.5 mg Per Tube QHS  . feeding supplement (PROSource TF)  45 mL Per Tube Daily  . folic acid  0.5 mg Per Tube Daily  . free water  200 mL Per Tube Q6H  . levothyroxine  50 mcg Per Tube QAC breakfast  . mouth rinse  15 mL Mouth Rinse q12n4p  . QUEtiapine  12.5 mg Per Tube QHS   Continuous Infusions: . ceFEPime (MAXIPIME) IV Stopped (11/09/20 1315)  . feeding supplement (OSMOLITE 1.5 CAL) 1,000 mL (11/09/20 1800)  Antimicrobials: Anti-infectives (From admission, onward)   Start     Dose/Rate Route Frequency Ordered Stop   11/04/20 1300  ceFEPIme (MAXIPIME) 2 g in sodium chloride 0.9 % 100 mL IVPB        2 g 200 mL/hr over 30 Minutes Intravenous Every 24 hours 11/04/20 1138 11/10/20 2359     Objective: Vitals: Today's Vitals   11/10/20 0509 11/10/20 0800 11/10/20 0940 11/10/20 1129  BP:  (!) 148/71  134/66  Pulse:  64  69  Resp:  18  14  Temp:  (!) 97.5 F (36.4 C)  98.7 F (37.1 C)  TempSrc: (P) Oral Oral  Axillary  SpO2:  100%  95%  Weight:      Height:      PainSc:   2      Intake/Output Summary (Last 24 hours) at 11/10/2020 1234 Last data filed at 11/09/2020 2157 Gross per 24 hour  Intake 200 ml  Output 150 ml  Net 50 ml   Filed Weights   11/03/20 1729  Weight: 83 kg   Weight change:   Intake/Output from  previous day: 03/02 0701 - 03/03 0700 In: 200 [IV Piggyback:200] Out: 650 [Urine:650] Intake/Output this shift: No intake/output data recorded. Filed Weights   11/03/20 1729  Weight: 83 kg   Examination:  General exam: Chronically ill elderly female laying in bed, awake alert oriented to self only, pleasantly confused CVS: S1-S2, regular rate rhythm Lungs: Decreased breath sounds bilaterally Abdomen: Soft, nontender, PEG tube noted, bowel sounds present Extremities: 1+ edema, contracted lower extremities   Data Reviewed: I have personally reviewed following labs and imaging studies CBC: Recent Labs  Lab 11/05/20 0439 11/06/20 0555 11/07/20 0119 11/08/20 0646 11/10/20 0221  WBC 3.9* 5.4 7.7 6.7 5.1  HGB 8.8* 8.8* 9.1* 8.0* 8.0*  HCT 24.8* 25.9* 25.7* 23.8* 24.0*  MCV 89.2 90.9 90.5 94.4 93.8  PLT 111* 125* 123* 101* 107*   Basic Metabolic Panel: Recent Labs  Lab 11/03/20 1415 11/03/20 1438 11/03/20 1908 11/03/20 2131 11/05/20 0439 11/05/20 1610 11/06/20 0021 11/06/20 0555 11/07/20 0119 11/08/20 0646 11/10/20 0221  NA 111*   < > 113*   < > 123*   < > 127* 130* 130* 130* 134*  K 5.6*   < > 5.8*   < > 3.8  --   --  4.2 4.4 4.1 3.6  CL 84*   < > 87*   < > 96*  --   --  101 101 101 107  CO2 20*  --  17*   < > 20*  --   --  21* 20* 22 20*  GLUCOSE 124*   < > 116*   < > 145*  --   --  131* 108* 85 140*  BUN 26*   < > 27*   < > 16  --   --  12 10 12 10   CREATININE 0.58   < > 0.54   < > 0.43*  --   --  0.42* 0.39* 0.41* 0.40*  CALCIUM 8.6*  --  9.1   < > 8.8*  --   --  8.9 8.9 8.3* 8.3*  MG  --   --   --   --   --   --   --   --   --  1.2*  --   PHOS 3.4  --  3.3  --   --   --   --   --   --   --   --    < > =  values in this interval not displayed.   GFR: Estimated Creatinine Clearance: 58 mL/min (A) (by C-G formula based on SCr of 0.4 mg/dL (L)). Liver Function Tests: Recent Labs  Lab 11/03/20 1415 11/03/20 1908 11/08/20 0646  AST  --   --  13*  ALT  --   --   10  ALKPHOS  --   --  49  BILITOT  --   --  1.0  PROT  --   --  4.2*  ALBUMIN 2.7* 2.9* 2.1*   No results for input(s): LIPASE, AMYLASE in the last 168 hours. Recent Labs  Lab 11/08/20 0839  AMMONIA 39*   Coagulation Profile: No results for input(s): INR, PROTIME in the last 168 hours. Cardiac Enzymes: No results for input(s): CKTOTAL, CKMB, CKMBINDEX, TROPONINI in the last 168 hours. BNP (last 3 results) No results for input(s): PROBNP in the last 8760 hours. HbA1C: No results for input(s): HGBA1C in the last 72 hours. CBG: Recent Labs  Lab 11/05/20 1215 11/07/20 0954 11/08/20 0542 11/09/20 0542 11/10/20 0641  GLUCAP 133* 112* 86 78 130*   Lipid Profile: No results for input(s): CHOL, HDL, LDLCALC, TRIG, CHOLHDL, LDLDIRECT in the last 72 hours. Thyroid Function Tests: No results for input(s): TSH, T4TOTAL, FREET4, T3FREE, THYROIDAB in the last 72 hours. Anemia Panel: No results for input(s): VITAMINB12, FOLATE, FERRITIN, TIBC, IRON, RETICCTPCT in the last 72 hours. Sepsis Labs: Recent Labs  Lab 11/04/20 1150 11/04/20 1440 11/04/20 1931 11/04/20 2259  LATICACIDVEN 0.8 0.9 0.8 1.1    Recent Results (from the past 240 hour(s))  Blood culture (routine single)     Status: None   Collection Time: 11/03/20 11:39 AM   Specimen: BLOOD  Result Value Ref Range Status   Specimen Description BLOOD FOOT  Final   Special Requests   Final    BOTTLES DRAWN AEROBIC AND ANAEROBIC Blood Culture adequate volume   Culture   Final    NO GROWTH 5 DAYS Performed at [facility], [address] [place], [place] [address]    Report Status 11/08/2020 FINAL  Final  Urine culture     Status: Abnormal   Collection Time: 11/03/20 11:39 AM   Specimen: In/Out Cath Urine  Result Value Ref Range Status   Specimen Description IN/OUT CATH URINE  Final   Special Requests   Final    NONE Performed at [facility], [address], [place], [place] [address]    Culture  >=100,000 COLONIES/mL PSEUDOMONAS AERUGINOSA (A)  Final   Report Status 11/05/2020 FINAL  Final   Organism ID, Bacteria PSEUDOMONAS AERUGINOSA (A)  Final      Susceptibility   Pseudomonas aeruginosa - MIC*    CEFTAZIDIME 4 SENSITIVE Sensitive     CIPROFLOXACIN <=0.25 SENSITIVE Sensitive     GENTAMICIN <=1 SENSITIVE Sensitive     IMIPENEM 2 SENSITIVE Sensitive     PIP/TAZO 8 SENSITIVE Sensitive     CEFEPIME 2 SENSITIVE Sensitive     * >=100,000 COLONIES/mL PSEUDOMONAS AERUGINOSA  Resp Panel by RT-PCR (Flu A&B, Covid) Urine, Catheterized     Status: None   Collection Time: 11/03/20 11:28 PM   Specimen: Urine, Catheterized; Nasopharyngeal(NP) swabs in vial transport medium  Result Value Ref Range Status   SARS Coronavirus 2 by RT PCR NEGATIVE NEGATIVE Final    Comment: (NOTE) SARS-CoV-2 target nucleic acids are NOT DETECTED.  The SARS-CoV-2 RNA is generally detectable in upper respiratory specimens during the acute phase of infection.  The lowest concentration of SARS-CoV-2 viral copies this assay can detect is 138 copies/mL. A negative result does not preclude SARS-Cov-2 infection and should not be used as the sole basis for treatment or other patient management decisions. A negative result may occur with  improper specimen collection/handling, submission of specimen other than nasopharyngeal swab, presence of viral mutation(s) within the areas targeted by this assay, and inadequate number of viral copies(<138 copies/mL). A negative result must be combined with clinical observations, patient history, and epidemiological information. The expected result is Negative.  Fact Sheet for Patients:  BloggerCourse.com  Fact Sheet for Healthcare Providers:  SeriousBroker.it  This test is no t yet approved or cleared by the [place] FDA and  has been authorized for detection and/or diagnosis of SARS-CoV-2 by FDA under an Emergency Use  Authorization (EUA). This EUA will remain  in effect (meaning this test can be used) for the duration of the COVID-19 declaration under Section 564(b)(1) of the Act, 21 U.S.C.section 360bbb-3(b)(1), unless the authorization is terminated  or revoked sooner.       Influenza A by PCR NEGATIVE NEGATIVE Final   Influenza B by PCR NEGATIVE NEGATIVE Final    Comment: (NOTE) The Xpert Xpress SARS-CoV-2/FLU/RSV plus assay is intended as an aid in the diagnosis of influenza from Nasopharyngeal swab specimens and should not be used as a sole basis for treatment. Nasal washings and aspirates are unacceptable for Xpert Xpress SARS-CoV-2/FLU/RSV testing.  Fact Sheet for Patients: BloggerCourse.com  Fact Sheet for Healthcare Providers: SeriousBroker.it  This test is not yet approved or cleared by the [place] FDA and has been authorized for detection and/or diagnosis of SARS-CoV-2 by FDA under an Emergency Use Authorization (EUA). This EUA will remain in effect (meaning this test can be used) for the duration of the COVID-19 declaration under Section 564(b)(1) of the Act, 21 U.S.C. section 360bbb-3(b)(1), unless the authorization is terminated or revoked.  Performed at [facility], [address], [place], [place] [address]   MRSA PCR Screening     Status: None   Collection Time: 11/03/20 11:35 PM   Specimen: Urine, Catheterized; Nasopharyngeal  Result Value Ref Range Status   MRSA by PCR NEGATIVE NEGATIVE Final    Comment:        The GeneXpert MRSA Assay (FDA approved for NASAL specimens only), is one component of a comprehensive MRSA colonization surveillance program. It is not intended to diagnose MRSA infection nor to guide or monitor treatment for MRSA infections. Performed at [facility], [address] [place], [place] [address]      Radiology Studies: IR GJ Tube Change  Result Date:  11/09/2020 INDICATION: Chronic pull-through gastrostomy tube, now with concern for malfunctioning. Please perform fluoroscopic guided exchange. EXAM: FLUOROSCOPIC GUIDED REPLACEMENT OF GASTROSTOMY TUBE COMPARISON:  None. MEDICATIONS: None. CONTRAST:  15mL OMNIPAQUE IOHEXOL 300 MG/ML SOLN administered into the gastric lumen FLUOROSCOPY TIME:  12 seconds COMPLICATIONS: None immediate. PROCEDURE: Informed written consent was obtained from the patient after a discussion of the risks, benefits and alternatives to treatment. Questions regarding the procedure were encouraged and answered. A timeout was performed prior to the initiation of the procedure. The upper abdomen and external portion of the existing gastrostomy tube was prepped and draped in the usual sterile fashion, and a sterile drape was applied covering the operative field. Maximum barrier sterile technique with sterile gowns and gloves were used for the procedure. A timeout was performed prior to the initiation of the  procedure. The existing gastrostomy tube was injected with a small amount of contrast confirming appropriate positioning within the gastric lumen. Utilizing manual contraction, the chronic pull-through gastrostomy tube was removed intact. Next, a 62 French balloon inflatable gastrostomy tube was inserted through the chronic gastrostomy track. Balloon was insufflated with approximately 10 cc of saline and dilute contrast and pulled against the anterior inner aspect of the gastric lumen. The external disc was cinched. Contrast was injected and a postprocedural spot fluoroscopic image was obtained demonstrating appropriate position functionality. A dressing was applied. The patient tolerated the procedure well without immediate postprocedural complication. IMPRESSION: Successful fluoroscopic guided replacement of a new 18-French balloon retention gastrostomy tube. The new gastrostomy tube is ready for immediate use. Electronically Signed   By: Simonne Come M.D.   On: 11/09/2020 08:55     LOS: 7 days   Zannie Cove, MD [facility]  11/10/2020, 12:34 PM

## 2020-11-11 ENCOUNTER — Inpatient Hospital Stay (HOSPITAL_COMMUNITY): Payer: Medicare Other

## 2020-11-11 DIAGNOSIS — I959 Hypotension, unspecified: Secondary | ICD-10-CM | POA: Diagnosis not present

## 2020-11-11 DIAGNOSIS — Z431 Encounter for attention to gastrostomy: Secondary | ICD-10-CM | POA: Diagnosis not present

## 2020-11-11 DIAGNOSIS — E871 Hypo-osmolality and hyponatremia: Secondary | ICD-10-CM | POA: Diagnosis not present

## 2020-11-11 DIAGNOSIS — R54 Age-related physical debility: Secondary | ICD-10-CM | POA: Diagnosis not present

## 2020-11-11 DIAGNOSIS — Z7189 Other specified counseling: Secondary | ICD-10-CM | POA: Diagnosis not present

## 2020-11-11 LAB — GLUCOSE, CAPILLARY
Glucose-Capillary: 123 mg/dL — ABNORMAL HIGH (ref 70–99)
Glucose-Capillary: 108 mg/dL — ABNORMAL HIGH (ref 70–99)
Glucose-Capillary: 106 mg/dL — ABNORMAL HIGH (ref 70–99)

## 2020-11-11 MED ORDER — ACETAMINOPHEN 160 MG/5ML PO SOLN
1000.0000 mg | Freq: Three times a day (TID) | ORAL | Status: DC
  Administered 2020-11-11 – 2020-11-12 (×4): 1000 mg
  Filled 2020-11-11 (×4): qty 40.6

## 2020-11-11 MED ORDER — CLONAZEPAM 0.25 MG PO TBDP
0.2500 mg | ORAL_TABLET | Freq: Every day | ORAL | Status: DC
  Administered 2020-11-12: 0.25 mg
  Filled 2020-11-11: qty 1

## 2020-11-11 MED ORDER — HYDRALAZINE HCL 25 MG PO TABS
25.0000 mg | ORAL_TABLET | Freq: Three times a day (TID) | ORAL | Status: DC
  Administered 2020-11-11 – 2020-11-12 (×4): 25 mg
  Filled 2020-11-11 (×4): qty 1

## 2020-11-11 MED ORDER — FUROSEMIDE 20 MG PO TABS
20.0000 mg | ORAL_TABLET | Freq: Every day | ORAL | Status: DC
  Administered 2020-11-11 – 2020-11-12 (×2): 20 mg
  Filled 2020-11-11 (×2): qty 1

## 2020-11-11 NOTE — Progress Notes (Signed)
Daily Progress Note   Patient Name: [name]       Date: 11/11/2020 DOB: 12-28-1933  Age: 87 y.o. MRN#: [id number] Attending Physician: Zannie Cove, MD Primary Care Physician: Paulina Fusi, MD Admit Date: 11/03/2020  Reason for Consultation/Follow-up: Establishing goals of care  Subjective: Met with patient and her son, [name] who was at the bedside.  Patient is unable to tell me why she is sick and in the hospital. She is not oriented to time. She is oriented to herself and to place.  She is lethargic. [name] notes that she tends to sleep more during the day and then be more awake in the evenings. She shares that she enjoys watching Wheel of  [address] and the [employer].  I attempted to again discuss advanced care planning with her and [name].  She continues to want to discharge home with her home health and private caregivers.  She is able to say that if she were dying she would not want to be in the hospital- she would want to be at home.  We discussed worries that she may be dying sooner than she would prefer.  She continues to endorse if she were at home and fell ill again she would want for return hospitalization.  [name] and I discussed her hyponatremia. He was giving her 64 oz of water three times a day. I reviewed with him the dieticians recommendations for 200 mL (a little less than 8 oz) of water every 6 hours or 4 times a day. We are hopeful this will help improve her hyponatremia.  We also discussed the likelihood of recurrent sepsis and UTI's with her indwelling foley catheter. For now they both do wish to continue to treat if this recurs. However, they are open to continued discussions with Palliative providers in their home regarding these goals of care.   Patient has had significant diffuse body aches. She was on scheduled tylenol at home.   Review of Systems  Constitutional: Positive for malaise/fatigue.  Respiratory: Positive for cough.   Genitourinary: Negative.     Length of Stay: 8  Current Medications: Scheduled Meds:  . acetaminophen (TYLENOL) oral liquid 160 mg/5 mL  1,000 mg Per Tube TID  . apixaban  5 mg Per Tube BID  . bethanechol  50 mg Per  Tube BID  . calcium-vitamin D  1 tablet Per Tube BID  . chlorhexidine  15 mL Mouth Rinse BID  . Chlorhexidine Gluconate Cloth  6 each Topical Daily  . clonazePAM  0.25 mg Per Tube QHS  . feeding supplement (PROSource TF)  45 mL Per Tube Daily  . folic acid  0.5 mg Per Tube Daily  . free water  200 mL Per Tube Q6H  . furosemide  20 mg Per Tube Daily  . levothyroxine  50 mcg Per Tube QAC breakfast  . mouth rinse  15 mL Mouth Rinse q12n4p  . QUEtiapine  12.5 mg Per Tube QHS    Continuous Infusions: . feeding supplement (OSMOLITE 1.5 CAL) 1,000 mL (11/10/20 1453)    PRN Meds: hydrALAZINE, lidocaine, ondansetron **OR** ondansetron (ZOFRAN) IV, polyethylene glycol  Physical Exam Vitals and nursing note reviewed.  Cardiovascular:     Comments: Anasarca in upper extremities Neurological:     Mental Status: Mental status is at baseline.  Psychiatric:        Mood and Affect: Mood normal.        Behavior: Behavior normal.     Comments: Very slow to process and answer questions, however answers are mostly appropriate             Vital Signs: BP (!) 150/79 (BP Location: Right Arm)   Pulse 65   Temp (!) 97.4 F (36.3 C) (Oral)   Resp 18   Ht 5\' 7"  (1.702 m)   Wt 83 kg   SpO2 99%   BMI 28.66 kg/m  SpO2: SpO2: 99 % O2 Device: O2 Device: Nasal Cannula O2 Flow Rate: O2 Flow Rate (L/min): 2 L/min  Intake/output summary:   Intake/Output Summary (Last 24 hours) at 11/11/2020 1212 Last data filed at 11/11/2020 1610 Gross per 24 hour  Intake 4026.83 ml  Output 400 ml  Net  3626.83 ml   LBM: Last BM Date: 11/04/20 Baseline Weight: Weight: 83 kg Most recent weight: Weight: 83 kg       Palliative Assessment/Data: PPS: 30%    Flowsheet Rows   Flowsheet Row Most Recent Value  Intake Tab   Referral Department Hospitalist  Unit at Time of Referral ICU  Date Notified 11/04/20  Palliative Care Type New Palliative care  Reason for referral Clarify Goals of Care  Date of Admission 11/03/20  Date first seen by Palliative Care 11/04/20  # of days Palliative referral response time 0 Day(s)  # of days IP prior to Palliative referral 1  Clinical Assessment   Psychosocial & Spiritual Assessment   Palliative Care Outcomes       Patient Active Problem List   Diagnosis Date Noted  . Pressure injury of skin 11/04/2020  . Protein-calorie malnutrition, severe 11/04/2020  . Hyponatremia 11/03/2020  . Acute ischemic right MCA stroke (HCC) 01/23/2016  . Gait disturbance, post-stroke   . Hemiparesis (HCC)   . Essential hypertension   . Dyslipidemia   . Thyroid activity decreased   . E-coli UTI   . Dyspepsia   . Dehydration   . Acute blood loss anemia   . Intracranial vascular stenosis   . Ischemic stroke (HCC) 01/19/2016  . Recurrent UTI 01/19/2016  . HLD (hyperlipidemia) 01/19/2016  . Intertrochanteric fracture of right hip (HCC) 03/23/2014  . HTN (hypertension) 03/23/2014  . Hypothyroidism 03/23/2014  . Anemia 03/23/2014  . Thrombocytopenia, unspecified (HCC) 03/23/2014  . Closed right hip fracture (HCC) 03/23/2014  . Pseudoaneurysm of right femoral artery (HCC)  01/31/2014  . Pseudoaneurysm of femoral artery (HCC) 01/30/2014  . Splenic artery aneurysm (HCC) 12/04/2013    Palliative Care Assessment & Plan   Patient Profile: [name] a 87 y.o.femalewith history of HTN, stroke with residual left-sided weakness, gastric feeding tube, chronic urinary retention on indwelling Foley catheter, hypothyroidism who was presenteddue tovomiting,  decreased oral intake and increased coughing,presented with a mental status change.  Palliative care has been consulted for goals of care given [name]'s chronically debilitated state.   Assessment/Recommendations/Plan   Continue current scope of care  Acetaminophen 1000mg  TID for body aches, d/c prn acetaminophen  Plan for home with home health, Palliative, and private caregivers  Goals of Care and Additional Recommendations:  Limitations on Scope of Treatment: Full Comfort Care and Full Scope Treatment  Code Status:  DNR  Prognosis:   Unable to determine  Discharge Planning:  Home with Palliative Services  Care plan was discussed with patient, son, and care team.   Thank you for allowing the Palliative Medicine Team to assist in the care of this patient.   Total time: 62  minutes Greater than 50%  of this time was spent counseling and coordinating care related to the above assessment and plan.  Ocie Bob, AGNP-C Palliative Medicine   Please contact Palliative Medicine Team phone at [phone] for questions and concerns.

## 2020-11-11 NOTE — Progress Notes (Signed)
Occupational Therapy Treatment Patient Details Name: [name] MRN: [id number] DOB: 02/16/1934 Today's Date: 11/11/2020    History of present illness 87 y.o. female with medical history significant of stroke with residual left-sided weakness on Eliquis since, chronic dysphagia s/p G tube, HTN, chronic urinary retention status post Foley catheter (since November 2021), hypothyroidism, presented with change of mentation. In ED found to have sodium level of 110 and UTI Admitted 2/24/2 for treatment of symptomatic hyponatremia with acute metabolic encephalopathy and chronic urinary retention post Foley indwelling, 11/09/20 had G-tube replacment due to blockage.   OT comments  Pt making gradual progress towards OT goals this session. Pt asleep upon arrival with son present.  Spent some time wit pts son Tresa Endo) discussing goals of therapy. Tresa Endo reports that he does want therapy to continue efforts to lift pt OOB with maximove and work on ROM and positioning as pt was completing grooming tasks and self feeding prior to admission. Tresa Endo also reports that pt does not have L rest hand splint- reached out to MD for order ( with OTR approving change change in POC) as pt would benefit to from L resting hand splint to be worn at night to prevent further contracture and to protect skin integrity- palm guard can continue to be worn during the day with staff doffing once per shift to check for skin breakdown. Demo'ed light PROM to pts LUE with Tresa Endo present and provided education on repositioning pt every 2-4 hours with education and ROM to be carried over at home . Tresa Endo appreciative of education and recommendation of HHOT. Will continue to follow pt acutely per POC.    Follow Up Recommendations  Home health OT;Supervision/Assistance - 24 hour    Equipment Recommendations  None recommended by OT    Recommendations for Other Services      Precautions / Restrictions Precautions Precautions: Other  (comment) Precaution Comments: G tube, L hemi weakness/tone Required Braces or Orthoses:  (palm guard to L hand) Restrictions Weight Bearing Restrictions: No       Mobility Bed Mobility               General bed mobility comments: deferred mobility d/t fatigue and level of arousal    Transfers                 General transfer comment: deferred mobility d/t fatigue and level of arousal    Balance                                           ADL either performed or assessed with clinical judgement   ADL Overall ADL's : At baseline;Needs assistance/impaired                                       General ADL Comments: pt currently total A for all ADLS. session focus on discussion with son about goals of therapy and to check palm guard. son reports they do not have L resting hand splint- reached out to MD for order     Vision       Perception     Praxis      Cognition Arousal/Alertness: Lethargic (sleeping most of session) Behavior During Therapy: Flat affect (noted to start crying suddenly during session with little explaination as to  where emotions were coming from) Overall Cognitive Status: Impaired/Different from baseline Area of Impairment: Attention;Memory;Following commands;Safety/judgement;Awareness;Problem solving                   Current Attention Level: Selective Memory: Decreased short-term memory Following Commands: Follows one step commands inconsistently Safety/Judgement: Decreased awareness of deficits Awareness: Intellectual Problem Solving: Slow processing;Decreased initiation;Difficulty sequencing;Requires verbal cues;Requires tactile cues General Comments: pt asleep at start of session but did arouse more as session continued, pt liable at one point became tearful that pt did know this COTA was speaking to her, very flat and following one step commands inconsistently        Exercises General  Exercises - Upper Extremity Elbow Flexion: PROM;Left;5 reps;Supine Elbow Extension: PROM;Left;Supine;5 reps Wrist Flexion: PROM;Left;5 reps;Supine Wrist Extension: PROM;Left;5 reps;Supine Digit Composite Flexion: PROM;Left;5 reps;Supine Composite Extension: PROM;Left;Supine;5 reps   Shoulder Instructions       General Comments doffed palm protector to check skin with no signs of redness or skin breakdown. Lengthy discussion with pts son about goals of therapy with son stating he does want therapy to continue aggressive measures to work with pt including lifting pt OOB and working on ROM as son reprots at baseline family was lifting pt OOB and pt was self feeding and completing grooming tasks- son would like for pt to be able to get back to doing those things. son also stated they do not have L resting hand splint from previous CVA- reached out to MD for order( with approval from OTR). education provided on repositioning pt every 2-4 hrs and completed ROM to all extremities. L prafo donned and additonally floated R heel to decrease risk for pressure injuries.    Pertinent Vitals/ Pain       Pain Assessment: Faces Faces Pain Scale: Hurts even more Pain Location: LUE with light ROM Pain Descriptors / Indicators: Discomfort;Crying;Heaviness Pain Intervention(s): Limited activity within patient's tolerance;Monitored during session;Repositioned  Home Living                                          Prior Functioning/Environment              Frequency  Min 1X/week        Progress Toward Goals  OT Goals(current goals can now be found in the care plan section)  Progress towards OT goals: Not progressing toward goals - comment (limited by lethargy)  Acute Rehab OT Goals Patient Stated Goal: none stated Time For Goal Achievement: 11/22/20 Potential to Achieve Goals: Fair  Plan Discharge plan remains appropriate;Frequency remains appropriate    Co-evaluation                  AM-PAC OT "6 Clicks" Daily Activity     Outcome Measure   Help from another person eating meals?: Total Help from another person taking care of personal grooming?: Total Help from another person toileting, which includes using toliet, bedpan, or urinal?: Total Help from another person bathing (including washing, rinsing, drying)?: Total Help from another person to put on and taking off regular upper body clothing?: Total Help from another person to put on and taking off regular lower body clothing?: Total 6 Click Score: 6    End of Session Equipment Utilized During Treatment: Other (comment) (palm guard for L hand)  OT Visit Diagnosis: Muscle weakness (generalized) (M62.81)   Activity Tolerance Patient limited by  lethargy   Patient Left in bed;with call bell/phone within reach;with bed alarm set;with family/visitor present   Nurse Communication Mobility status        Time: 1610-9604 OT Time Calculation (min): 16 min  Charges: OT General Charges $OT Visit: 1 Visit OT Treatments $Therapeutic Activity: 8-22 mins  Lenor Derrick., COTA/L Acute Rehabilitation Services [phone]    Barron Schmid 11/11/2020, 4:35 PM

## 2020-11-11 NOTE — Progress Notes (Signed)
Modified Barium Swallow Progress Note  Patient Details  Name: [name] MRN: [id number] Date of Birth: 1934-03-16  Today's Date: 11/11/2020  Modified Barium Swallow completed.  Full report located under Chart Review in the Imaging Section.  Brief recommendations include the following:  Clinical Impression  Today's MBS was limited due to pt's level of alertness despite Max multimodal cues and encouragement from SLP. Pt presents with an oropharyngeal dysphagia in which her oral phase is characterized by reduced lip seal and lingual control, resulting in anterior loss of saliva and liquids as well as a mildly delayed oral transit and decreased bolus cohesion. Her pharyngeal stage is characterized by a delayed swallow initiation in which boluses were noted mainly in the valleculae. The pt's impaired oral stage as well has her delayed swallow initiation led to boluses reaching her airway prior to her swallow (PAS 7) with nectar thick consistencies. Pt's pharyngeal swallow appears WFL with small boluses of honey thick liquids and purees; however, due to pt's lethargic state, SLP was unable to challenge pt with these consistencies to more thorougly assess airway protection. Recommend that the pt remain NPO except for purees from the floor stock until she can be better challenged when she is more alert. Pt and son also previsouly reported that they would like to hold off on PO intake if risk for aspiration were high. SLP will continue to follow up for an appropriate diet recommendation based on the results of this study when her mentation is more clear - pt has a PEG tube for nutrition in the interim.    Swallow Evaluation Recommendations       SLP Diet Recommendations: NPO (pt can have snacks of puree from the floor stock)       Medication Administration: Via alternative means               Oral Care Recommendations: Oral care QID        Jeanine Luz., SLP Student 11/11/2020,9:59 AM

## 2020-11-11 NOTE — Progress Notes (Addendum)
copies/mL). A negative result must be combined with clinical observations, patient history, and epidemiological information. The expected result is Negative.  Fact Sheet for Patients:  EntrepreneurPulse.com.au  Fact Sheet for Healthcare Providers:   IncredibleEmployment.be  This test is no t yet approved or cleared by the [place] FDA and  has been authorized for detection and/or diagnosis of SARS-CoV-2 by FDA under an Emergency Use Authorization (EUA). This EUA will remain  in effect (meaning this test can be used) for the duration of the COVID-19 declaration under Section 564(b)(1) of the Act, 21 U.S.C.section 360bbb-3(b)(1), unless the authorization is terminated  or revoked sooner.       Influenza A by PCR NEGATIVE NEGATIVE Final   Influenza B by PCR NEGATIVE NEGATIVE Final    Comment: (NOTE) The Xpert Xpress SARS-CoV-2/FLU/RSV plus assay is intended as an aid in the diagnosis of influenza from Nasopharyngeal swab specimens and should not be used as a sole basis for treatment. Nasal washings and aspirates are unacceptable for Xpert Xpress SARS-CoV-2/FLU/RSV testing.  Fact Sheet for Patients: EntrepreneurPulse.com.au  Fact Sheet for Healthcare Providers: IncredibleEmployment.be  This test is not yet approved or cleared by the [place] FDA and has been authorized for detection and/or diagnosis of SARS-CoV-2 by FDA under an Emergency Use Authorization (EUA). This EUA will remain in effect (meaning this test can be used) for the duration of the COVID-19 declaration under Section 564(b)(1) of the Act, 21 U.S.C. section 360bbb-3(b)(1), unless the authorization is terminated or revoked.  Performed at [facility], [place] [address] [place], [place] [address]   MRSA PCR Screening     Status: None   Collection Time: 11/03/20 11:35 PM   Specimen: Urine, Catheterized; Nasopharyngeal  Result Value Ref Range Status   MRSA by PCR NEGATIVE NEGATIVE Final    Comment:        The GeneXpert MRSA Assay (FDA approved for NASAL specimens only), is one component of a comprehensive MRSA colonization surveillance program. It is not intended to diagnose  MRSA infection nor to guide or monitor treatment for MRSA infections. Performed at [facility], [place] [address] [place], [place] [address]      Radiology Studies: DG Swallowing Func-Speech Pathology  Result Date: 11/11/2020 Objective Swallowing Evaluation: Type of Study: MBS-Modified Barium Swallow Study  Patient Details Name: [name] MRN: [id number] Date of Birth: 06/22/1934 Today's Date: 11/11/2020 Time: SLP Start Time (ACUTE ONLY): 0836 -SLP Stop Time (ACUTE ONLY): 0859 SLP Time Calculation (min) (ACUTE ONLY): 23 min Past Medical History: Past Medical History: Diagnosis Date . Anemia  . Arthritis   "was in my knees" . High cholesterol  . History of blood transfusion   "S/P knee OR" . Hypertension  . Hypothyroidism  . Pneumonia   "maybe in my teens" . Recurrent UTI (urinary tract infection)  . Stroke [facility]) 1990's  "mild", denies residual on 01/28/2014 Past Surgical History: Past Surgical History: Procedure Laterality Date . BLADDER SUSPENSION  2012 . EMBOLIZATION N/A 01/28/2014  Procedure: EMBOLIZATION;  Surgeon: Conrad Rensselaer, MD;  Location: [facility] CATH LAB;  Service: Cardiovascular;  Laterality: N/A;  Splenic Artery . FEMUR IM NAIL Right 03/23/2014  Procedure: INTRAMEDULLARY (IM) NAIL FEMORAL;  Surgeon: Mauri Pole, MD;  Location: [facility] ORS;  Service: Orthopedics;  Laterality: Right; . IR [place] TUBE CHANGE  11/09/2020 . JOINT REPLACEMENT   . MUSCLE BIOPSY Left 1990's  "knot biopsy from carrying mail bag for years" . REVISON OF ARTERIOVENOUS FISTULA Right 01/31/2014  PROGRESS NOTE    [name]  IRS:[id number] DOB: 29-Mar-1934 DOA: 11/03/2020 PCP: Nicoletta Dress, MD   Chief Complaint  Patient presents with  . Cough   Brief Narrative: 87 year old female with history of dementia, multiple strokes with left-sided weakness on Eliquis, chronic dysphagia with G-tube in place, hypertension, chronic urine retention status post Foley catheter since 2021 November, hypothyroidism, chronic debility patient able to communicate with family members uses peddled mini bike to ground, able to eat some solid food but usually not enough liquids which she consumes via G-tube, she was brought to the ED due to multiple episodes of vomiting, subsequently he also appeared more lethargic, has son switched her over to tube feeds eventually and then she developed worsening confusion. -She also has chronic urinary retention and had a Foley placed in November/21 which gets exchanged every 30 days recently treated with 5 days of Macrobid for presumed UTI 2 days prior to admission -In the ED she was noted to be hyponatremic with serum sodium of 110 she was admitted to the ICU, nephrology was consulted, on 2/24 she was started on hypertonic saline -Subsequently urine cultures grew Pseudomonas and was treated with cefepime -Seen by palliative care changed to DNR. BP stable patient transferred out of ICU Patient noted to have discoloration/food ?mold on PEG-IR consulted to eval for possible exchange, PEG tube was exchanged 3/2  Subjective: -A little lethargic this morning, tolerating tube feeds, about to go down for swallow eval  Assessment & Plan:  Subacute hyponatremia, with mild hypovolemia due to GI loss and possibly low solute intake:   -Urine sod <10,TSH okay osmol 108,  -patient was transferred to ICU on hypertonic saline.Off 3% saline 2/25 AM.Sodium improved and holding stable at 130.  -Off IV fluids now, started on tube feeds and free water via NG tube -Patient is not  to resume Lexapro or black elderberry per nephrology -Sodium improved and stable now  Metabolic encephalopathy Suspected vascular dementia Dysphagia -At baseline has memory deficits, likely component of vascular dementia from multiple prior strokes, exacerbated in the setting of hyponatremia and long hospitalization -Fluctuating mental status, appears somewhat lethargic today, will cut down her p.m. dose of Klonopin -Had a PEG tube prior to admission, was only using this for supplemental fluids prior to admission, however now with fluctuating mental status she is mostly tube feed dependent -SLP following, plan for MBS today  Hypotension due to hypovolemia and UTI:  -Blood pressure is more stable however she has profound 3 third spacing, discontinued IV fluids and lisinopril  -Start low-dose Lasix, monitor sodium  Pseudomonas UTI  - in the setting of chronic indwelling Foley catheter, -Foley was exchanged 2/22 -Completed 7 days of cefepime, antibiotics discontinued 3/3  Hyperkalemia resolved Non-anion gap metabolic acidosis resolved  Hx of ischemic Stroke with left-sided weakness - At baseline knows her name and date of birth - Predominantly total care at home - CT head that also showed large chronic right MCA infarct, small chronic infarct in the right cerebellum and chronic small vessel ischemic changes. -Continue Eliquis  Chronic dysphagia with G-tube in place since 01/03/2018, nursing noticed problem with G-tube question discoloration-mold/old food --IR consulted for possible PEG tube exchange -This was completed 3/2, resume tube feeds  -SLP following, plan for MBS   Hypothyroidism: On Synthroid  Anemia likely from chronic disease ferritin high, iron normal.  -Trend hemoglobin  Chronic debility with multiple complex comorbidities.Appreciate palliative input  Severe protein calorie malnutrition Nutrition Problem: Severe Malnutrition Etiology:  PROGRESS NOTE    [name]  IRS:[id number] DOB: 29-Mar-1934 DOA: 11/03/2020 PCP: Nicoletta Dress, MD   Chief Complaint  Patient presents with  . Cough   Brief Narrative: 87 year old female with history of dementia, multiple strokes with left-sided weakness on Eliquis, chronic dysphagia with G-tube in place, hypertension, chronic urine retention status post Foley catheter since 2021 November, hypothyroidism, chronic debility patient able to communicate with family members uses peddled mini bike to ground, able to eat some solid food but usually not enough liquids which she consumes via G-tube, she was brought to the ED due to multiple episodes of vomiting, subsequently he also appeared more lethargic, has son switched her over to tube feeds eventually and then she developed worsening confusion. -She also has chronic urinary retention and had a Foley placed in November/21 which gets exchanged every 30 days recently treated with 5 days of Macrobid for presumed UTI 2 days prior to admission -In the ED she was noted to be hyponatremic with serum sodium of 110 she was admitted to the ICU, nephrology was consulted, on 2/24 she was started on hypertonic saline -Subsequently urine cultures grew Pseudomonas and was treated with cefepime -Seen by palliative care changed to DNR. BP stable patient transferred out of ICU Patient noted to have discoloration/food ?mold on PEG-IR consulted to eval for possible exchange, PEG tube was exchanged 3/2  Subjective: -A little lethargic this morning, tolerating tube feeds, about to go down for swallow eval  Assessment & Plan:  Subacute hyponatremia, with mild hypovolemia due to GI loss and possibly low solute intake:   -Urine sod <10,TSH okay osmol 108,  -patient was transferred to ICU on hypertonic saline.Off 3% saline 2/25 AM.Sodium improved and holding stable at 130.  -Off IV fluids now, started on tube feeds and free water via NG tube -Patient is not  to resume Lexapro or black elderberry per nephrology -Sodium improved and stable now  Metabolic encephalopathy Suspected vascular dementia Dysphagia -At baseline has memory deficits, likely component of vascular dementia from multiple prior strokes, exacerbated in the setting of hyponatremia and long hospitalization -Fluctuating mental status, appears somewhat lethargic today, will cut down her p.m. dose of Klonopin -Had a PEG tube prior to admission, was only using this for supplemental fluids prior to admission, however now with fluctuating mental status she is mostly tube feed dependent -SLP following, plan for MBS today  Hypotension due to hypovolemia and UTI:  -Blood pressure is more stable however she has profound 3 third spacing, discontinued IV fluids and lisinopril  -Start low-dose Lasix, monitor sodium  Pseudomonas UTI  - in the setting of chronic indwelling Foley catheter, -Foley was exchanged 2/22 -Completed 7 days of cefepime, antibiotics discontinued 3/3  Hyperkalemia resolved Non-anion gap metabolic acidosis resolved  Hx of ischemic Stroke with left-sided weakness - At baseline knows her name and date of birth - Predominantly total care at home - CT head that also showed large chronic right MCA infarct, small chronic infarct in the right cerebellum and chronic small vessel ischemic changes. -Continue Eliquis  Chronic dysphagia with G-tube in place since 01/03/2018, nursing noticed problem with G-tube question discoloration-mold/old food --IR consulted for possible PEG tube exchange -This was completed 3/2, resume tube feeds  -SLP following, plan for MBS   Hypothyroidism: On Synthroid  Anemia likely from chronic disease ferritin high, iron normal.  -Trend hemoglobin  Chronic debility with multiple complex comorbidities.Appreciate palliative input  Severe protein calorie malnutrition Nutrition Problem: Severe Malnutrition Etiology:  copies/mL). A negative result must be combined with clinical observations, patient history, and epidemiological information. The expected result is Negative.  Fact Sheet for Patients:  EntrepreneurPulse.com.au  Fact Sheet for Healthcare Providers:   IncredibleEmployment.be  This test is no t yet approved or cleared by the [place] FDA and  has been authorized for detection and/or diagnosis of SARS-CoV-2 by FDA under an Emergency Use Authorization (EUA). This EUA will remain  in effect (meaning this test can be used) for the duration of the COVID-19 declaration under Section 564(b)(1) of the Act, 21 U.S.C.section 360bbb-3(b)(1), unless the authorization is terminated  or revoked sooner.       Influenza A by PCR NEGATIVE NEGATIVE Final   Influenza B by PCR NEGATIVE NEGATIVE Final    Comment: (NOTE) The Xpert Xpress SARS-CoV-2/FLU/RSV plus assay is intended as an aid in the diagnosis of influenza from Nasopharyngeal swab specimens and should not be used as a sole basis for treatment. Nasal washings and aspirates are unacceptable for Xpert Xpress SARS-CoV-2/FLU/RSV testing.  Fact Sheet for Patients: EntrepreneurPulse.com.au  Fact Sheet for Healthcare Providers: IncredibleEmployment.be  This test is not yet approved or cleared by the [place] FDA and has been authorized for detection and/or diagnosis of SARS-CoV-2 by FDA under an Emergency Use Authorization (EUA). This EUA will remain in effect (meaning this test can be used) for the duration of the COVID-19 declaration under Section 564(b)(1) of the Act, 21 U.S.C. section 360bbb-3(b)(1), unless the authorization is terminated or revoked.  Performed at [facility], [place] [address] [place], [place] [address]   MRSA PCR Screening     Status: None   Collection Time: 11/03/20 11:35 PM   Specimen: Urine, Catheterized; Nasopharyngeal  Result Value Ref Range Status   MRSA by PCR NEGATIVE NEGATIVE Final    Comment:        The GeneXpert MRSA Assay (FDA approved for NASAL specimens only), is one component of a comprehensive MRSA colonization surveillance program. It is not intended to diagnose  MRSA infection nor to guide or monitor treatment for MRSA infections. Performed at [facility], [place] [address] [place], [place] [address]      Radiology Studies: DG Swallowing Func-Speech Pathology  Result Date: 11/11/2020 Objective Swallowing Evaluation: Type of Study: MBS-Modified Barium Swallow Study  Patient Details Name: [name] MRN: [id number] Date of Birth: 06/22/1934 Today's Date: 11/11/2020 Time: SLP Start Time (ACUTE ONLY): 0836 -SLP Stop Time (ACUTE ONLY): 0859 SLP Time Calculation (min) (ACUTE ONLY): 23 min Past Medical History: Past Medical History: Diagnosis Date . Anemia  . Arthritis   "was in my knees" . High cholesterol  . History of blood transfusion   "S/P knee OR" . Hypertension  . Hypothyroidism  . Pneumonia   "maybe in my teens" . Recurrent UTI (urinary tract infection)  . Stroke [facility]) 1990's  "mild", denies residual on 01/28/2014 Past Surgical History: Past Surgical History: Procedure Laterality Date . BLADDER SUSPENSION  2012 . EMBOLIZATION N/A 01/28/2014  Procedure: EMBOLIZATION;  Surgeon: Conrad Rensselaer, MD;  Location: [facility] CATH LAB;  Service: Cardiovascular;  Laterality: N/A;  Splenic Artery . FEMUR IM NAIL Right 03/23/2014  Procedure: INTRAMEDULLARY (IM) NAIL FEMORAL;  Surgeon: Mauri Pole, MD;  Location: [facility] ORS;  Service: Orthopedics;  Laterality: Right; . IR [place] TUBE CHANGE  11/09/2020 . JOINT REPLACEMENT   . MUSCLE BIOPSY Left 1990's  "knot biopsy from carrying mail bag for years" . REVISON OF ARTERIOVENOUS FISTULA Right 01/31/2014  copies/mL). A negative result must be combined with clinical observations, patient history, and epidemiological information. The expected result is Negative.  Fact Sheet for Patients:  EntrepreneurPulse.com.au  Fact Sheet for Healthcare Providers:   IncredibleEmployment.be  This test is no t yet approved or cleared by the [place] FDA and  has been authorized for detection and/or diagnosis of SARS-CoV-2 by FDA under an Emergency Use Authorization (EUA). This EUA will remain  in effect (meaning this test can be used) for the duration of the COVID-19 declaration under Section 564(b)(1) of the Act, 21 U.S.C.section 360bbb-3(b)(1), unless the authorization is terminated  or revoked sooner.       Influenza A by PCR NEGATIVE NEGATIVE Final   Influenza B by PCR NEGATIVE NEGATIVE Final    Comment: (NOTE) The Xpert Xpress SARS-CoV-2/FLU/RSV plus assay is intended as an aid in the diagnosis of influenza from Nasopharyngeal swab specimens and should not be used as a sole basis for treatment. Nasal washings and aspirates are unacceptable for Xpert Xpress SARS-CoV-2/FLU/RSV testing.  Fact Sheet for Patients: EntrepreneurPulse.com.au  Fact Sheet for Healthcare Providers: IncredibleEmployment.be  This test is not yet approved or cleared by the [place] FDA and has been authorized for detection and/or diagnosis of SARS-CoV-2 by FDA under an Emergency Use Authorization (EUA). This EUA will remain in effect (meaning this test can be used) for the duration of the COVID-19 declaration under Section 564(b)(1) of the Act, 21 U.S.C. section 360bbb-3(b)(1), unless the authorization is terminated or revoked.  Performed at [facility], [place] [address] [place], [place] [address]   MRSA PCR Screening     Status: None   Collection Time: 11/03/20 11:35 PM   Specimen: Urine, Catheterized; Nasopharyngeal  Result Value Ref Range Status   MRSA by PCR NEGATIVE NEGATIVE Final    Comment:        The GeneXpert MRSA Assay (FDA approved for NASAL specimens only), is one component of a comprehensive MRSA colonization surveillance program. It is not intended to diagnose  MRSA infection nor to guide or monitor treatment for MRSA infections. Performed at [facility], [place] [address] [place], [place] [address]      Radiology Studies: DG Swallowing Func-Speech Pathology  Result Date: 11/11/2020 Objective Swallowing Evaluation: Type of Study: MBS-Modified Barium Swallow Study  Patient Details Name: [name] MRN: [id number] Date of Birth: 06/22/1934 Today's Date: 11/11/2020 Time: SLP Start Time (ACUTE ONLY): 0836 -SLP Stop Time (ACUTE ONLY): 0859 SLP Time Calculation (min) (ACUTE ONLY): 23 min Past Medical History: Past Medical History: Diagnosis Date . Anemia  . Arthritis   "was in my knees" . High cholesterol  . History of blood transfusion   "S/P knee OR" . Hypertension  . Hypothyroidism  . Pneumonia   "maybe in my teens" . Recurrent UTI (urinary tract infection)  . Stroke [facility]) 1990's  "mild", denies residual on 01/28/2014 Past Surgical History: Past Surgical History: Procedure Laterality Date . BLADDER SUSPENSION  2012 . EMBOLIZATION N/A 01/28/2014  Procedure: EMBOLIZATION;  Surgeon: Conrad Rensselaer, MD;  Location: [facility] CATH LAB;  Service: Cardiovascular;  Laterality: N/A;  Splenic Artery . FEMUR IM NAIL Right 03/23/2014  Procedure: INTRAMEDULLARY (IM) NAIL FEMORAL;  Surgeon: Mauri Pole, MD;  Location: [facility] ORS;  Service: Orthopedics;  Laterality: Right; . IR [place] TUBE CHANGE  11/09/2020 . JOINT REPLACEMENT   . MUSCLE BIOPSY Left 1990's  "knot biopsy from carrying mail bag for years" . REVISON OF ARTERIOVENOUS FISTULA Right 01/31/2014  PROGRESS NOTE    [name]  IRS:[id number] DOB: 29-Mar-1934 DOA: 11/03/2020 PCP: Nicoletta Dress, MD   Chief Complaint  Patient presents with  . Cough   Brief Narrative: 87 year old female with history of dementia, multiple strokes with left-sided weakness on Eliquis, chronic dysphagia with G-tube in place, hypertension, chronic urine retention status post Foley catheter since 2021 November, hypothyroidism, chronic debility patient able to communicate with family members uses peddled mini bike to ground, able to eat some solid food but usually not enough liquids which she consumes via G-tube, she was brought to the ED due to multiple episodes of vomiting, subsequently he also appeared more lethargic, has son switched her over to tube feeds eventually and then she developed worsening confusion. -She also has chronic urinary retention and had a Foley placed in November/21 which gets exchanged every 30 days recently treated with 5 days of Macrobid for presumed UTI 2 days prior to admission -In the ED she was noted to be hyponatremic with serum sodium of 110 she was admitted to the ICU, nephrology was consulted, on 2/24 she was started on hypertonic saline -Subsequently urine cultures grew Pseudomonas and was treated with cefepime -Seen by palliative care changed to DNR. BP stable patient transferred out of ICU Patient noted to have discoloration/food ?mold on PEG-IR consulted to eval for possible exchange, PEG tube was exchanged 3/2  Subjective: -A little lethargic this morning, tolerating tube feeds, about to go down for swallow eval  Assessment & Plan:  Subacute hyponatremia, with mild hypovolemia due to GI loss and possibly low solute intake:   -Urine sod <10,TSH okay osmol 108,  -patient was transferred to ICU on hypertonic saline.Off 3% saline 2/25 AM.Sodium improved and holding stable at 130.  -Off IV fluids now, started on tube feeds and free water via NG tube -Patient is not  to resume Lexapro or black elderberry per nephrology -Sodium improved and stable now  Metabolic encephalopathy Suspected vascular dementia Dysphagia -At baseline has memory deficits, likely component of vascular dementia from multiple prior strokes, exacerbated in the setting of hyponatremia and long hospitalization -Fluctuating mental status, appears somewhat lethargic today, will cut down her p.m. dose of Klonopin -Had a PEG tube prior to admission, was only using this for supplemental fluids prior to admission, however now with fluctuating mental status she is mostly tube feed dependent -SLP following, plan for MBS today  Hypotension due to hypovolemia and UTI:  -Blood pressure is more stable however she has profound 3 third spacing, discontinued IV fluids and lisinopril  -Start low-dose Lasix, monitor sodium  Pseudomonas UTI  - in the setting of chronic indwelling Foley catheter, -Foley was exchanged 2/22 -Completed 7 days of cefepime, antibiotics discontinued 3/3  Hyperkalemia resolved Non-anion gap metabolic acidosis resolved  Hx of ischemic Stroke with left-sided weakness - At baseline knows her name and date of birth - Predominantly total care at home - CT head that also showed large chronic right MCA infarct, small chronic infarct in the right cerebellum and chronic small vessel ischemic changes. -Continue Eliquis  Chronic dysphagia with G-tube in place since 01/03/2018, nursing noticed problem with G-tube question discoloration-mold/old food --IR consulted for possible PEG tube exchange -This was completed 3/2, resume tube feeds  -SLP following, plan for MBS   Hypothyroidism: On Synthroid  Anemia likely from chronic disease ferritin high, iron normal.  -Trend hemoglobin  Chronic debility with multiple complex comorbidities.Appreciate palliative input  Severe protein calorie malnutrition Nutrition Problem: Severe Malnutrition Etiology:  PROGRESS NOTE    [name]  IRS:[id number] DOB: 29-Mar-1934 DOA: 11/03/2020 PCP: Nicoletta Dress, MD   Chief Complaint  Patient presents with  . Cough   Brief Narrative: 87 year old female with history of dementia, multiple strokes with left-sided weakness on Eliquis, chronic dysphagia with G-tube in place, hypertension, chronic urine retention status post Foley catheter since 2021 November, hypothyroidism, chronic debility patient able to communicate with family members uses peddled mini bike to ground, able to eat some solid food but usually not enough liquids which she consumes via G-tube, she was brought to the ED due to multiple episodes of vomiting, subsequently he also appeared more lethargic, has son switched her over to tube feeds eventually and then she developed worsening confusion. -She also has chronic urinary retention and had a Foley placed in November/21 which gets exchanged every 30 days recently treated with 5 days of Macrobid for presumed UTI 2 days prior to admission -In the ED she was noted to be hyponatremic with serum sodium of 110 she was admitted to the ICU, nephrology was consulted, on 2/24 she was started on hypertonic saline -Subsequently urine cultures grew Pseudomonas and was treated with cefepime -Seen by palliative care changed to DNR. BP stable patient transferred out of ICU Patient noted to have discoloration/food ?mold on PEG-IR consulted to eval for possible exchange, PEG tube was exchanged 3/2  Subjective: -A little lethargic this morning, tolerating tube feeds, about to go down for swallow eval  Assessment & Plan:  Subacute hyponatremia, with mild hypovolemia due to GI loss and possibly low solute intake:   -Urine sod <10,TSH okay osmol 108,  -patient was transferred to ICU on hypertonic saline.Off 3% saline 2/25 AM.Sodium improved and holding stable at 130.  -Off IV fluids now, started on tube feeds and free water via NG tube -Patient is not  to resume Lexapro or black elderberry per nephrology -Sodium improved and stable now  Metabolic encephalopathy Suspected vascular dementia Dysphagia -At baseline has memory deficits, likely component of vascular dementia from multiple prior strokes, exacerbated in the setting of hyponatremia and long hospitalization -Fluctuating mental status, appears somewhat lethargic today, will cut down her p.m. dose of Klonopin -Had a PEG tube prior to admission, was only using this for supplemental fluids prior to admission, however now with fluctuating mental status she is mostly tube feed dependent -SLP following, plan for MBS today  Hypotension due to hypovolemia and UTI:  -Blood pressure is more stable however she has profound 3 third spacing, discontinued IV fluids and lisinopril  -Start low-dose Lasix, monitor sodium  Pseudomonas UTI  - in the setting of chronic indwelling Foley catheter, -Foley was exchanged 2/22 -Completed 7 days of cefepime, antibiotics discontinued 3/3  Hyperkalemia resolved Non-anion gap metabolic acidosis resolved  Hx of ischemic Stroke with left-sided weakness - At baseline knows her name and date of birth - Predominantly total care at home - CT head that also showed large chronic right MCA infarct, small chronic infarct in the right cerebellum and chronic small vessel ischemic changes. -Continue Eliquis  Chronic dysphagia with G-tube in place since 01/03/2018, nursing noticed problem with G-tube question discoloration-mold/old food --IR consulted for possible PEG tube exchange -This was completed 3/2, resume tube feeds  -SLP following, plan for MBS   Hypothyroidism: On Synthroid  Anemia likely from chronic disease ferritin high, iron normal.  -Trend hemoglobin  Chronic debility with multiple complex comorbidities.Appreciate palliative input  Severe protein calorie malnutrition Nutrition Problem: Severe Malnutrition Etiology:

## 2020-11-11 NOTE — Care Management Important Message (Signed)
Important Message  Patient Details  Name: [name] MRN: [id number] Date of Birth: Sep 06, 1934   Medicare Important Message Given:  Yes     Shelda Altes 11/11/2020, 10:58 AM

## 2020-11-12 DIAGNOSIS — I959 Hypotension, unspecified: Secondary | ICD-10-CM | POA: Diagnosis not present

## 2020-11-12 DIAGNOSIS — E875 Hyperkalemia: Secondary | ICD-10-CM

## 2020-11-12 DIAGNOSIS — E871 Hypo-osmolality and hyponatremia: Secondary | ICD-10-CM | POA: Diagnosis not present

## 2020-11-12 LAB — GLUCOSE, CAPILLARY
Glucose-Capillary: 117 mg/dL — ABNORMAL HIGH (ref 70–99)
Glucose-Capillary: 94 mg/dL (ref 70–99)
Glucose-Capillary: 117 mg/dL — ABNORMAL HIGH (ref 70–99)

## 2020-11-12 LAB — BASIC METABOLIC PANEL
Anion gap: 7 (ref 5–15)
BUN: 14 mg/dL (ref 8–23)
CO2: 24 mmol/L (ref 22–32)
Calcium: 8.8 mg/dL — ABNORMAL LOW (ref 8.9–10.3)
Chloride: 103 mmol/L (ref 98–111)
Creatinine, Ser: 0.4 mg/dL — ABNORMAL LOW (ref 0.44–1.00)
GFR, Estimated: >60 mL/min (ref >60)
Glucose, Bld: 94 mg/dL (ref 70–99)
Potassium: 4.3 mmol/L (ref 3.5–5.1)
Sodium: 134 mmol/L — ABNORMAL LOW (ref 135–145)

## 2020-11-12 LAB — CBC
HCT: 26.1 % — ABNORMAL LOW (ref 36.0–46.0)
Hemoglobin: 8.6 g/dL — ABNORMAL LOW (ref 12.0–15.0)
MCH: 31.4 pg (ref 26.0–34.0)
MCHC: 33 g/dL (ref 30.0–36.0)
MCV: 95.3 fL (ref 80.0–100.0)
Platelets: 129 10*3/uL — ABNORMAL LOW (ref 150–400)
RBC: 2.74 MIL/uL — ABNORMAL LOW (ref 3.87–5.11)
RDW: 12.7 % (ref 11.5–15.5)
WBC: 5.3 10*3/uL (ref 4.0–10.5)
nRBC: 0 % (ref 0.0–0.2)

## 2020-11-12 MED ORDER — FREE WATER: 200.0000 mL | Freq: Four times a day (QID) | Status: AC

## 2020-11-12 MED ORDER — FUROSEMIDE 20 MG PO TABS: 20.0000 mg | ORAL_TABLET | Freq: Every day | ORAL | 0 refills | Status: AC

## 2020-11-12 MED ORDER — APIXABAN 5 MG PO TABS: 5.0000 mg | ORAL_TABLET | Freq: Two times a day (BID) | ORAL | Status: AC

## 2020-11-12 MED ORDER — LEVOTHYROXINE SODIUM 50 MCG PO TABS: 50.0000 ug | ORAL_TABLET | Freq: Every day | ORAL | Status: AC

## 2020-11-12 MED ORDER — FOLIC ACID 800 MCG PO TABS: 400.0000 ug | ORAL_TABLET | Freq: Every day | ORAL | Status: AC

## 2020-11-12 MED ORDER — BETHANECHOL CHLORIDE 50 MG PO TABS: 50.0000 mg | ORAL_TABLET | Freq: Two times a day (BID) | ORAL | Status: AC

## 2020-11-12 MED ORDER — ACETAMINOPHEN 500 MG PO TABS: 500.0000 mg | ORAL_TABLET | Freq: Four times a day (QID) | ORAL | 0 refills | Status: AC | PRN

## 2020-11-12 MED ORDER — CARVEDILOL 6.25 MG PO TABS: 6.2500 mg | ORAL_TABLET | Freq: Two times a day (BID) | ORAL | Status: AC

## 2020-11-12 MED ORDER — HYDRALAZINE HCL 10 MG PO TABS: 10.0000 mg | ORAL_TABLET | Freq: Three times a day (TID) | ORAL | 0 refills | Status: AC

## 2020-11-12 MED ORDER — DOXAZOSIN MESYLATE 4 MG PO TABS: 4.0000 mg | ORAL_TABLET | Freq: Every day | ORAL | Status: AC

## 2020-11-12 MED ORDER — OSMOLITE 1.5 CAL PO LIQD: 1000.0000 mL | ORAL | 5 refills | Status: AC

## 2020-11-12 MED ORDER — PROSOURCE TF PO LIQD: 45.0000 mL | Freq: Every day | ORAL | 2 refills | Status: AC

## 2020-11-12 MED ORDER — QUETIAPINE FUMARATE 25 MG PO TABS: 12.5000 mg | ORAL_TABLET | Freq: Every day | ORAL | Status: AC

## 2020-11-12 MED ORDER — CLONAZEPAM 0.5 MG PO TABS: 0.2500 mg | ORAL_TABLET | Freq: Two times a day (BID) | ORAL | 0 refills | Status: AC

## 2020-11-12 MED ORDER — AMLODIPINE BESYLATE 5 MG PO TABS: 5.0000 mg | ORAL_TABLET | Freq: Two times a day (BID) | ORAL | Status: AC

## 2020-11-12 NOTE — Progress Notes (Signed)
Discharge paperwork printed and placed up front to give to transport for patient. Left a voicemail for [name], patients son, to call unit if he had any questions regarding discharge instructions. Transport on the way.  Daymon Larsen, RN

## 2020-11-12 NOTE — Progress Notes (Addendum)
Received referral for [facility] PT/OT/RN and DME for home O2 and a tube feeding pump. Per previous CM note, son prefers to use [facility]. Contacted Jason with [facility] for referral. Eloise Harman with [facility] for DME referral.   DME to be delivered home. Son will notify us after the DME has been delivered. RN to arrange transportation after DME has been delivered.

## 2020-11-12 NOTE — Progress Notes (Addendum)
Occupational Therapy Treatment Patient Details Name: [name] MRN: [id number] DOB: 09-05-1934 Today's Date: 11/12/2020    History of present illness 87 y.o. female with medical history significant of stroke with residual left-sided weakness on Eliquis since, chronic dysphagia s/p G tube, HTN, chronic urinary retention status post Foley catheter (since November 2021), hypothyroidism, presented with change of mentation. In ED found to have sodium level of 110 and UTI Admitted 2/24/2 for treatment of symptomatic hyponatremia with acute metabolic encephalopathy and chronic urinary retention post Foley indwelling, 11/09/20 had G-tube replacment due to blockage.   OT comments  Pt seen for splint check. Tolerating palm guard without any problems. Given current functional state, would continue with palm guard, removing it for ADL task and to complete hand hygiene. Recommend daily ROM for L hand/UE as tolerated rather than using a static resting hand splint at this time to reduce risk of complications from pressure. Keep LUE supported on 2 pillows with hand elevated to reduce dependent edema. Plan is to DC home with palliative and caregivers. Son would like for HHOT/PT. HHOT can address self care issues and positioning/splinting needs to decrease burden of care.   Follow Up Recommendations  Home health OT;Supervision/Assistance - 24 hour    Equipment Recommendations  None recommended by OT    Recommendations for Other Services      Precautions / Restrictions Precautions Precautions: Fall Precaution Comments: G tube, L hemi weakness/tone Required Braces or Orthoses: Splint/Cast;Other Brace Splint/Cast: L palm guard; would benefit fomr L resting hand splint at night Restrictions Weight Bearing Restrictions: No       Mobility Bed Mobility                    Transfers                      Balance                                           ADL either  performed or assessed with clinical judgement   ADL                                               Vision       Perception     Praxis      Cognition Arousal/Alertness: Lethargic Behavior During Therapy: Flat affect Overall Cognitive Status: No family/caregiver present to determine baseline cognitive functioning                                          Exercises     Shoulder Instructions       General Comments seen for splint check    Pertinent Vitals/ Pain       Pain Assessment: Faces Faces Pain Scale: Hurts a little bit Pain Location: LUE with light ROM Pain Descriptors / Indicators: Discomfort Pain Intervention(s): Limited activity within patient's tolerance  Home Living  Prior Functioning/Environment              Frequency  Min 1X/week        Progress Toward Goals  OT Goals(current goals can now be found in the care plan section)  Progress towards OT goals: Progressing toward goals  Acute Rehab OT Goals Patient Stated Goal: unable to state Time For Goal Achievement: 11/22/20 Potential to Achieve Goals: Fair ADL Goals Pt/caregiver will Perform Home Exercise Program: Both right and left upper extremity;With minimal assist;With written HEP provided Additional ADL Goal #1: Pt will demonstrate activity tolerance for 3 UB grooming activities, implementing energy conservation strategies as needed Additional ADL Goal #2: Pt will tolerate palm guard per set schedule to improve hand hygiene Additional ADL Goal #3: Caregiver will verbalize understanding of proper positioning and ROM to reduce risk of skin breakdown and contracture management  Plan Discharge plan remains appropriate;Frequency remains appropriate    Co-evaluation                 AM-PAC OT "6 Clicks" Daily Activity     Outcome Measure   Help from another person eating meals?: Total Help  from another person taking care of personal grooming?: Total Help from another person toileting, which includes using toliet, bedpan, or urinal?: Total Help from another person bathing (including washing, rinsing, drying)?: Total Help from another person to put on and taking off regular upper body clothing?: Total Help from another person to put on and taking off regular lower body clothing?: Total 6 Click Score: 6    End of Session    OT Visit Diagnosis: Muscle weakness (generalized) (M62.81)   Activity Tolerance Patient tolerated treatment well   Patient Left in bed;with call bell/phone within reach;with bed alarm set   Nurse Communication          Time: 1030-1041 OT Time Calculation (min): 11 min  Charges: OT General Charges $OT Visit: 1 Visit OT Treatments $Orthotics/Prosthetics Check: 8-22 mins  Luisa Dago, OT/L   Acute OT Clinical Specialist Acute Rehabilitation Services Pager [phone] Office [phone]    [facility] 11/12/2020, 10:48 AM

## 2020-11-12 NOTE — Discharge Summary (Signed)
By: Sandi Mariscal M.D.   On: 11/09/2020 08:55   DG CHEST PORT 1 VIEW  Result Date: 11/08/2020 CLINICAL DATA:  Hypoxia EXAM: PORTABLE CHEST 1 VIEW COMPARISON:  November 03, 2020 FINDINGS: The heart size and mediastinal contours are mildly enlarged. Aortic knob calcifications are seen. No large airspace consolidation or pleural effusion. No acute osseous abnormality. IMPRESSION: No active disease. Electronically Signed   By: Prudencio Pair M.D.   On: 11/08/2020 00:40   DG Chest Port 1 View  Result Date: 11/03/2020 CLINICAL DATA:  Questionable sepsis. EXAM: PORTABLE CHEST 1 VIEW COMPARISON:  Chest x-ray 07/03/2018. FINDINGS: Mediastinum is stable. Heart size normal. No focal infiltrate. Biapical pleural-parenchymal thickening again noted consistent scarring. No pleural effusion or  pneumothorax. Prominent skin fold on the right. Degenerative change thoracic spine. IMPRESSION: No acute cardiopulmonary disease. Electronically Signed   By: Marcello Moores  Register   On: 11/03/2020 12:09   DG Swallowing Func-Speech Pathology  Result Date: 11/11/2020 Objective Swallowing Evaluation: Type of Study: MBS-Modified Barium Swallow Study  Patient Details Name: [name] MRN: [id number] Date of Birth: 1934/04/23 Today's Date: 11/11/2020 Time: SLP Start Time (ACUTE ONLY): 0836 -SLP Stop Time (ACUTE ONLY): 0859 SLP Time Calculation (min) (ACUTE ONLY): 23 min Past Medical History: Past Medical History: Diagnosis Date . Anemia  . Arthritis   "was in my knees" . High cholesterol  . History of blood transfusion   "S/P knee OR" . Hypertension  . Hypothyroidism  . Pneumonia   "maybe in my teens" . Recurrent UTI (urinary tract infection)  . Stroke [facility]) 1990's  "mild", denies residual on 01/28/2014 Past Surgical History: Past Surgical History: Procedure Laterality Date . BLADDER SUSPENSION  2012 . EMBOLIZATION N/A 01/28/2014  Procedure: EMBOLIZATION;  Surgeon: Conrad Danville, MD;  Location: [facility] CATH LAB;  Service: Cardiovascular;  Laterality: N/A;  Splenic Artery . FEMUR IM NAIL Right 03/23/2014  Procedure: INTRAMEDULLARY (IM) NAIL FEMORAL;  Surgeon: Mauri Pole, MD;  Location: [facility] ORS;  Service: Orthopedics;  Laterality: Right; . IR Ona TUBE CHANGE  11/09/2020 . JOINT REPLACEMENT   . MUSCLE BIOPSY Left 1990's  "knot biopsy from carrying mail bag for years" . REVISON OF ARTERIOVENOUS FISTULA Right 01/31/2014  Procedure: REPAIR PSEUDOANEURYSM;  Surgeon: Serafina Mitchell, MD;  Location: [facility] OR;  Service: Vascular;  Laterality: Right;  Repair of Femoral Pseudoaneurysm. . SPLENIC ARTERY EMBOLIZATION  01/28/2014 . TOTAL KNEE ARTHROPLASTY Bilateral 2012 . VAGINAL HYSTERECTOMY   . VISCERAL ANGIOGRAM N/A 01/28/2014  Procedure: VISCERAL ANGIOGRAM;  Surgeon: Conrad Dixon, MD;  Location: [facility] CATH LAB;  Service: Cardiovascular;   Laterality: N/A; HPI: Pt is an 87 yo female presenting with AMS and recent vomiting. Pt was evaluated for swallowing in 2017 with mild oral weakness for which she compensated with Mod I. Regular solids and thin liquids were recommended. In 2019 she was seen at OSH with more severe oropharyngeal dysphagia and NPO was recommended.  PMH also includes: CVA with residual L-sided weakness, chronic dysphagia s/p G-tube (able to eat solid food but not enough liquids per MD H&P note), HTN, chronic urinary retention s/p foley catheter, hypothyroidism  Subjective: Pt drowsy and required cues Assessment / Plan / Recommendation CHL IP CLINICAL IMPRESSIONS 11/11/2020 Clinical Impression  Today's MBS was limited due to pt's level of alertness despite Max multimodal cues and encouragement from SLP. Pt presents with an oropharyngeal dysphagia in which her oral phase is characterized by reduced lip seal and lingual control, resulting in anterior loss of saliva  By: Sandi Mariscal M.D.   On: 11/09/2020 08:55   DG CHEST PORT 1 VIEW  Result Date: 11/08/2020 CLINICAL DATA:  Hypoxia EXAM: PORTABLE CHEST 1 VIEW COMPARISON:  November 03, 2020 FINDINGS: The heart size and mediastinal contours are mildly enlarged. Aortic knob calcifications are seen. No large airspace consolidation or pleural effusion. No acute osseous abnormality. IMPRESSION: No active disease. Electronically Signed   By: Prudencio Pair M.D.   On: 11/08/2020 00:40   DG Chest Port 1 View  Result Date: 11/03/2020 CLINICAL DATA:  Questionable sepsis. EXAM: PORTABLE CHEST 1 VIEW COMPARISON:  Chest x-ray 07/03/2018. FINDINGS: Mediastinum is stable. Heart size normal. No focal infiltrate. Biapical pleural-parenchymal thickening again noted consistent scarring. No pleural effusion or  pneumothorax. Prominent skin fold on the right. Degenerative change thoracic spine. IMPRESSION: No acute cardiopulmonary disease. Electronically Signed   By: Marcello Moores  Register   On: 11/03/2020 12:09   DG Swallowing Func-Speech Pathology  Result Date: 11/11/2020 Objective Swallowing Evaluation: Type of Study: MBS-Modified Barium Swallow Study  Patient Details Name: [name] MRN: [id number] Date of Birth: 1934/04/23 Today's Date: 11/11/2020 Time: SLP Start Time (ACUTE ONLY): 0836 -SLP Stop Time (ACUTE ONLY): 0859 SLP Time Calculation (min) (ACUTE ONLY): 23 min Past Medical History: Past Medical History: Diagnosis Date . Anemia  . Arthritis   "was in my knees" . High cholesterol  . History of blood transfusion   "S/P knee OR" . Hypertension  . Hypothyroidism  . Pneumonia   "maybe in my teens" . Recurrent UTI (urinary tract infection)  . Stroke [facility]) 1990's  "mild", denies residual on 01/28/2014 Past Surgical History: Past Surgical History: Procedure Laterality Date . BLADDER SUSPENSION  2012 . EMBOLIZATION N/A 01/28/2014  Procedure: EMBOLIZATION;  Surgeon: Conrad Danville, MD;  Location: [facility] CATH LAB;  Service: Cardiovascular;  Laterality: N/A;  Splenic Artery . FEMUR IM NAIL Right 03/23/2014  Procedure: INTRAMEDULLARY (IM) NAIL FEMORAL;  Surgeon: Mauri Pole, MD;  Location: [facility] ORS;  Service: Orthopedics;  Laterality: Right; . IR Ona TUBE CHANGE  11/09/2020 . JOINT REPLACEMENT   . MUSCLE BIOPSY Left 1990's  "knot biopsy from carrying mail bag for years" . REVISON OF ARTERIOVENOUS FISTULA Right 01/31/2014  Procedure: REPAIR PSEUDOANEURYSM;  Surgeon: Serafina Mitchell, MD;  Location: [facility] OR;  Service: Vascular;  Laterality: Right;  Repair of Femoral Pseudoaneurysm. . SPLENIC ARTERY EMBOLIZATION  01/28/2014 . TOTAL KNEE ARTHROPLASTY Bilateral 2012 . VAGINAL HYSTERECTOMY   . VISCERAL ANGIOGRAM N/A 01/28/2014  Procedure: VISCERAL ANGIOGRAM;  Surgeon: Conrad Dixon, MD;  Location: [facility] CATH LAB;  Service: Cardiovascular;   Laterality: N/A; HPI: Pt is an 87 yo female presenting with AMS and recent vomiting. Pt was evaluated for swallowing in 2017 with mild oral weakness for which she compensated with Mod I. Regular solids and thin liquids were recommended. In 2019 she was seen at OSH with more severe oropharyngeal dysphagia and NPO was recommended.  PMH also includes: CVA with residual L-sided weakness, chronic dysphagia s/p G-tube (able to eat solid food but not enough liquids per MD H&P note), HTN, chronic urinary retention s/p foley catheter, hypothyroidism  Subjective: Pt drowsy and required cues Assessment / Plan / Recommendation CHL IP CLINICAL IMPRESSIONS 11/11/2020 Clinical Impression  Today's MBS was limited due to pt's level of alertness despite Max multimodal cues and encouragement from SLP. Pt presents with an oropharyngeal dysphagia in which her oral phase is characterized by reduced lip seal and lingual control, resulting in anterior loss of saliva  By: Sandi Mariscal M.D.   On: 11/09/2020 08:55   DG CHEST PORT 1 VIEW  Result Date: 11/08/2020 CLINICAL DATA:  Hypoxia EXAM: PORTABLE CHEST 1 VIEW COMPARISON:  November 03, 2020 FINDINGS: The heart size and mediastinal contours are mildly enlarged. Aortic knob calcifications are seen. No large airspace consolidation or pleural effusion. No acute osseous abnormality. IMPRESSION: No active disease. Electronically Signed   By: Prudencio Pair M.D.   On: 11/08/2020 00:40   DG Chest Port 1 View  Result Date: 11/03/2020 CLINICAL DATA:  Questionable sepsis. EXAM: PORTABLE CHEST 1 VIEW COMPARISON:  Chest x-ray 07/03/2018. FINDINGS: Mediastinum is stable. Heart size normal. No focal infiltrate. Biapical pleural-parenchymal thickening again noted consistent scarring. No pleural effusion or  pneumothorax. Prominent skin fold on the right. Degenerative change thoracic spine. IMPRESSION: No acute cardiopulmonary disease. Electronically Signed   By: Marcello Moores  Register   On: 11/03/2020 12:09   DG Swallowing Func-Speech Pathology  Result Date: 11/11/2020 Objective Swallowing Evaluation: Type of Study: MBS-Modified Barium Swallow Study  Patient Details Name: [name] MRN: [id number] Date of Birth: 1934/04/23 Today's Date: 11/11/2020 Time: SLP Start Time (ACUTE ONLY): 0836 -SLP Stop Time (ACUTE ONLY): 0859 SLP Time Calculation (min) (ACUTE ONLY): 23 min Past Medical History: Past Medical History: Diagnosis Date . Anemia  . Arthritis   "was in my knees" . High cholesterol  . History of blood transfusion   "S/P knee OR" . Hypertension  . Hypothyroidism  . Pneumonia   "maybe in my teens" . Recurrent UTI (urinary tract infection)  . Stroke [facility]) 1990's  "mild", denies residual on 01/28/2014 Past Surgical History: Past Surgical History: Procedure Laterality Date . BLADDER SUSPENSION  2012 . EMBOLIZATION N/A 01/28/2014  Procedure: EMBOLIZATION;  Surgeon: Conrad Danville, MD;  Location: [facility] CATH LAB;  Service: Cardiovascular;  Laterality: N/A;  Splenic Artery . FEMUR IM NAIL Right 03/23/2014  Procedure: INTRAMEDULLARY (IM) NAIL FEMORAL;  Surgeon: Mauri Pole, MD;  Location: [facility] ORS;  Service: Orthopedics;  Laterality: Right; . IR Ona TUBE CHANGE  11/09/2020 . JOINT REPLACEMENT   . MUSCLE BIOPSY Left 1990's  "knot biopsy from carrying mail bag for years" . REVISON OF ARTERIOVENOUS FISTULA Right 01/31/2014  Procedure: REPAIR PSEUDOANEURYSM;  Surgeon: Serafina Mitchell, MD;  Location: [facility] OR;  Service: Vascular;  Laterality: Right;  Repair of Femoral Pseudoaneurysm. . SPLENIC ARTERY EMBOLIZATION  01/28/2014 . TOTAL KNEE ARTHROPLASTY Bilateral 2012 . VAGINAL HYSTERECTOMY   . VISCERAL ANGIOGRAM N/A 01/28/2014  Procedure: VISCERAL ANGIOGRAM;  Surgeon: Conrad Dixon, MD;  Location: [facility] CATH LAB;  Service: Cardiovascular;   Laterality: N/A; HPI: Pt is an 87 yo female presenting with AMS and recent vomiting. Pt was evaluated for swallowing in 2017 with mild oral weakness for which she compensated with Mod I. Regular solids and thin liquids were recommended. In 2019 she was seen at OSH with more severe oropharyngeal dysphagia and NPO was recommended.  PMH also includes: CVA with residual L-sided weakness, chronic dysphagia s/p G-tube (able to eat solid food but not enough liquids per MD H&P note), HTN, chronic urinary retention s/p foley catheter, hypothyroidism  Subjective: Pt drowsy and required cues Assessment / Plan / Recommendation CHL IP CLINICAL IMPRESSIONS 11/11/2020 Clinical Impression  Today's MBS was limited due to pt's level of alertness despite Max multimodal cues and encouragement from SLP. Pt presents with an oropharyngeal dysphagia in which her oral phase is characterized by reduced lip seal and lingual control, resulting in anterior loss of saliva  Physician Discharge Summary  [name] QPY:[id number] DOB: 1933-11-03 DOA: 11/03/2020  PCP: Nicoletta Dress, MD  Admit date: 11/03/2020 Discharge date: 11/12/2020  Time spent: 35 minutes  Recommendations for Outpatient Follow-up:  Resume home health PT OT RN, continue tube feeds and meds via PEG tube Palliative care follow-up, continue goals of care discussions, prognosis is very poor Routine catheter care, change Foley every 30 days PCP in 1 week   Discharge Diagnoses:  Vascular dementia Dysphagia Severe hyponatremia Pseudomonas UTI Chronic Foley Urinary retention History of ischemic stroke with left-sided weakness Adult failure to thrive Moderate protein calorie malnutrition   Hyponatremia   Hypotension   Hyperkalemia   Discharge Condition: Stable  Diet recommendation: N.p.o., tube feeds, free water  [employer]   11/03/20 1729  Weight: 83 kg    History of present illness:  87 year old female with history of dementia, multiple strokes with left-sided weakness on Eliquis, chronic dysphagia with G-tube in place, hypertension, chronic urine retention status post Foley catheter since 2021 November, hypothyroidism, chronic debility patient able to communicate with family members uses peddled mini bike to ground, able to eat some solid food but usually not enough liquids which she consumes via G-tube, she was brought to the ED due to multiple episodes of vomiting, subsequently he also appeared more lethargic, has son switched her over to tube feeds eventually and then she developed worsening confusion. -She also has chronic urinary retention and had a Foley placed in November/21 which gets exchanged every 30 days recently treated with 5 days of Macrobid for presumed UTI 2 days prior to admission -In the ED she was noted to be hyponatremic with serum sodium of 110 she was admitted to the ICU, nephrology was consulted, on 2/24 she was started on hypertonic  saline  Hospital Course:   Subacute hyponatremia, with mild hypovolemia due to GI loss and possibly low solute intake:   -Urine sod <10,TSH okay osmol 108,  -patient was transferred to ICU on hypertonic saline.Off 3% saline 2/25 AM.Sodium improved and holding stable at 130.  -Off IV fluids now, started on tube feeds and free water via NG tube -Patient is not to resume Lexapro or black elderberry per nephrology -Sodium improved and stable now  Metabolic encephalopathy Suspected vascular dementia Dysphagia -At baseline has memory deficits, likely component of vascular dementia from multiple prior strokes, exacerbated in the setting of hyponatremia and long hospitalization -Had a PEG tube prior to admission,  son was only using this for supplemental fluids prior to admission, however now with fluctuating mental status she is mostly tube feed dependent  Chronic dysphagia with G-tube in place since 01/03/2018, nursing noticed problem with G-tube question discoloration-mold/old food --IR consulted for possible PEG tube exchange -This was completed 3/2, resume tube feeds  -Failed swallow evaluation, since she already had a PEG tube from 2 years ago, she was started on tube feeds and meds via PEG tube  Edema Third spacing, hypoalbuminemia -Blood pressure is more stable however she has profound 3 third spacing, discontinued IV fluids and lisinopril  -Start low-dose Lasix  Pseudomonas UTI  - in the setting of chronic indwelling Foley catheter, -Foley was exchanged 2/22 -Completed 7 days of cefepime, antibiotics discontinued 3/3  Hyperkalemia resolved  Hx of ischemic Stroke with left-sided weakness - At baseline knows her name and date of birth - Predominantly total care at home - CT head that also showed large chronic right MCA infarct, small chronic infarct in the right cerebellum and chronic  Physician Discharge Summary  [name] QPY:[id number] DOB: 1933-11-03 DOA: 11/03/2020  PCP: Nicoletta Dress, MD  Admit date: 11/03/2020 Discharge date: 11/12/2020  Time spent: 35 minutes  Recommendations for Outpatient Follow-up:  Resume home health PT OT RN, continue tube feeds and meds via PEG tube Palliative care follow-up, continue goals of care discussions, prognosis is very poor Routine catheter care, change Foley every 30 days PCP in 1 week   Discharge Diagnoses:  Vascular dementia Dysphagia Severe hyponatremia Pseudomonas UTI Chronic Foley Urinary retention History of ischemic stroke with left-sided weakness Adult failure to thrive Moderate protein calorie malnutrition   Hyponatremia   Hypotension   Hyperkalemia   Discharge Condition: Stable  Diet recommendation: N.p.o., tube feeds, free water  [employer]   11/03/20 1729  Weight: 83 kg    History of present illness:  87 year old female with history of dementia, multiple strokes with left-sided weakness on Eliquis, chronic dysphagia with G-tube in place, hypertension, chronic urine retention status post Foley catheter since 2021 November, hypothyroidism, chronic debility patient able to communicate with family members uses peddled mini bike to ground, able to eat some solid food but usually not enough liquids which she consumes via G-tube, she was brought to the ED due to multiple episodes of vomiting, subsequently he also appeared more lethargic, has son switched her over to tube feeds eventually and then she developed worsening confusion. -She also has chronic urinary retention and had a Foley placed in November/21 which gets exchanged every 30 days recently treated with 5 days of Macrobid for presumed UTI 2 days prior to admission -In the ED she was noted to be hyponatremic with serum sodium of 110 she was admitted to the ICU, nephrology was consulted, on 2/24 she was started on hypertonic  saline  Hospital Course:   Subacute hyponatremia, with mild hypovolemia due to GI loss and possibly low solute intake:   -Urine sod <10,TSH okay osmol 108,  -patient was transferred to ICU on hypertonic saline.Off 3% saline 2/25 AM.Sodium improved and holding stable at 130.  -Off IV fluids now, started on tube feeds and free water via NG tube -Patient is not to resume Lexapro or black elderberry per nephrology -Sodium improved and stable now  Metabolic encephalopathy Suspected vascular dementia Dysphagia -At baseline has memory deficits, likely component of vascular dementia from multiple prior strokes, exacerbated in the setting of hyponatremia and long hospitalization -Had a PEG tube prior to admission,  son was only using this for supplemental fluids prior to admission, however now with fluctuating mental status she is mostly tube feed dependent  Chronic dysphagia with G-tube in place since 01/03/2018, nursing noticed problem with G-tube question discoloration-mold/old food --IR consulted for possible PEG tube exchange -This was completed 3/2, resume tube feeds  -Failed swallow evaluation, since she already had a PEG tube from 2 years ago, she was started on tube feeds and meds via PEG tube  Edema Third spacing, hypoalbuminemia -Blood pressure is more stable however she has profound 3 third spacing, discontinued IV fluids and lisinopril  -Start low-dose Lasix  Pseudomonas UTI  - in the setting of chronic indwelling Foley catheter, -Foley was exchanged 2/22 -Completed 7 days of cefepime, antibiotics discontinued 3/3  Hyperkalemia resolved  Hx of ischemic Stroke with left-sided weakness - At baseline knows her name and date of birth - Predominantly total care at home - CT head that also showed large chronic right MCA infarct, small chronic infarct in the right cerebellum and chronic  Physician Discharge Summary  [name] [id number] DOB: 1933-11-03 DOA: 11/03/2020  PCP: Nicoletta Dress, MD  Admit date: 11/03/2020 Discharge date: 11/12/2020  Time spent: 35 minutes  Recommendations for Outpatient Follow-up:  Resume home health PT OT RN, continue tube feeds and meds via PEG tube Palliative care follow-up, continue goals of care discussions, prognosis is very poor Routine catheter care, change Foley every 30 days PCP in 1 week   Discharge Diagnoses:  Vascular dementia Dysphagia Severe hyponatremia Pseudomonas UTI Chronic Foley Urinary retention History of ischemic stroke with left-sided weakness Adult failure to thrive Moderate protein calorie malnutrition   Hyponatremia   Hypotension   Hyperkalemia   Discharge Condition: Stable  Diet recommendation: N.p.o., tube feeds, free water  [employer]   11/03/20 1729  Weight: 83 kg    History of present illness:  87 year old female with history of dementia, multiple strokes with left-sided weakness on Eliquis, chronic dysphagia with G-tube in place, hypertension, chronic urine retention status post Foley catheter since 2021 November, hypothyroidism, chronic debility patient able to communicate with family members uses peddled mini bike to ground, able to eat some solid food but usually not enough liquids which she consumes via G-tube, she was brought to the ED due to multiple episodes of vomiting, subsequently he also appeared more lethargic, has son switched her over to tube feeds eventually and then she developed worsening confusion. -She also has chronic urinary retention and had a Foley placed in November/21 which gets exchanged every 30 days recently treated with 5 days of Macrobid for presumed UTI 2 days prior to admission -In the ED she was noted to be hyponatremic with serum sodium of 110 she was admitted to the ICU, nephrology was consulted, on 2/24 she was started on hypertonic  saline  Hospital Course:   Subacute hyponatremia, with mild hypovolemia due to GI loss and possibly low solute intake:   -Urine sod <10,TSH okay osmol 108,  -patient was transferred to ICU on hypertonic saline.Off 3% saline 2/25 AM.Sodium improved and holding stable at 130.  -Off IV fluids now, started on tube feeds and free water via NG tube -Patient is not to resume Lexapro or black elderberry per nephrology -Sodium improved and stable now  Metabolic encephalopathy Suspected vascular dementia Dysphagia -At baseline has memory deficits, likely component of vascular dementia from multiple prior strokes, exacerbated in the setting of hyponatremia and long hospitalization -Had a PEG tube prior to admission,  son was only using this for supplemental fluids prior to admission, however now with fluctuating mental status she is mostly tube feed dependent  Chronic dysphagia with G-tube in place since 01/03/2018, nursing noticed problem with G-tube question discoloration-mold/old food --IR consulted for possible PEG tube exchange -This was completed 3/2, resume tube feeds  -Failed swallow evaluation, since she already had a PEG tube from 2 years ago, she was started on tube feeds and meds via PEG tube  Edema Third spacing, hypoalbuminemia -Blood pressure is more stable however she has profound 3 third spacing, discontinued IV fluids and lisinopril  -Start low-dose Lasix  Pseudomonas UTI  - in the setting of chronic indwelling Foley catheter, -Foley was exchanged 2/22 -Completed 7 days of cefepime, antibiotics discontinued 3/3  Hyperkalemia resolved  Hx of ischemic Stroke with left-sided weakness - At baseline knows her name and date of birth - Predominantly total care at home - CT head that also showed large chronic right MCA infarct, small chronic infarct in the right cerebellum and chronic  By: Sandi Mariscal M.D.   On: 11/09/2020 08:55   DG CHEST PORT 1 VIEW  Result Date: 11/08/2020 CLINICAL DATA:  Hypoxia EXAM: PORTABLE CHEST 1 VIEW COMPARISON:  November 03, 2020 FINDINGS: The heart size and mediastinal contours are mildly enlarged. Aortic knob calcifications are seen. No large airspace consolidation or pleural effusion. No acute osseous abnormality. IMPRESSION: No active disease. Electronically Signed   By: Prudencio Pair M.D.   On: 11/08/2020 00:40   DG Chest Port 1 View  Result Date: 11/03/2020 CLINICAL DATA:  Questionable sepsis. EXAM: PORTABLE CHEST 1 VIEW COMPARISON:  Chest x-ray 07/03/2018. FINDINGS: Mediastinum is stable. Heart size normal. No focal infiltrate. Biapical pleural-parenchymal thickening again noted consistent scarring. No pleural effusion or  pneumothorax. Prominent skin fold on the right. Degenerative change thoracic spine. IMPRESSION: No acute cardiopulmonary disease. Electronically Signed   By: Marcello Moores  Register   On: 11/03/2020 12:09   DG Swallowing Func-Speech Pathology  Result Date: 11/11/2020 Objective Swallowing Evaluation: Type of Study: MBS-Modified Barium Swallow Study  Patient Details Name: [name] MRN: [id number] Date of Birth: 1934/04/23 Today's Date: 11/11/2020 Time: SLP Start Time (ACUTE ONLY): 0836 -SLP Stop Time (ACUTE ONLY): 0859 SLP Time Calculation (min) (ACUTE ONLY): 23 min Past Medical History: Past Medical History: Diagnosis Date . Anemia  . Arthritis   "was in my knees" . High cholesterol  . History of blood transfusion   "S/P knee OR" . Hypertension  . Hypothyroidism  . Pneumonia   "maybe in my teens" . Recurrent UTI (urinary tract infection)  . Stroke [facility]) 1990's  "mild", denies residual on 01/28/2014 Past Surgical History: Past Surgical History: Procedure Laterality Date . BLADDER SUSPENSION  2012 . EMBOLIZATION N/A 01/28/2014  Procedure: EMBOLIZATION;  Surgeon: Conrad Danville, MD;  Location: [facility] CATH LAB;  Service: Cardiovascular;  Laterality: N/A;  Splenic Artery . FEMUR IM NAIL Right 03/23/2014  Procedure: INTRAMEDULLARY (IM) NAIL FEMORAL;  Surgeon: Mauri Pole, MD;  Location: [facility] ORS;  Service: Orthopedics;  Laterality: Right; . IR [place] TUBE CHANGE  11/09/2020 . JOINT REPLACEMENT   . MUSCLE BIOPSY Left 1990's  "knot biopsy from carrying mail bag for years" . REVISON OF ARTERIOVENOUS FISTULA Right 01/31/2014  Procedure: REPAIR PSEUDOANEURYSM;  Surgeon: Serafina Mitchell, MD;  Location: [facility] OR;  Service: Vascular;  Laterality: Right;  Repair of Femoral Pseudoaneurysm. . SPLENIC ARTERY EMBOLIZATION  01/28/2014 . TOTAL KNEE ARTHROPLASTY Bilateral 2012 . VAGINAL HYSTERECTOMY   . VISCERAL ANGIOGRAM N/A 01/28/2014  Procedure: VISCERAL ANGIOGRAM;  Surgeon: Conrad Dixon, MD;  Location: [facility] CATH LAB;  Service: Cardiovascular;   Laterality: N/A; HPI: Pt is an 87 yo female presenting with AMS and recent vomiting. Pt was evaluated for swallowing in 2017 with mild oral weakness for which she compensated with Mod I. Regular solids and thin liquids were recommended. In 2019 she was seen at OSH with more severe oropharyngeal dysphagia and NPO was recommended.  PMH also includes: CVA with residual L-sided weakness, chronic dysphagia s/p G-tube (able to eat solid food but not enough liquids per MD H&P note), HTN, chronic urinary retention s/p foley catheter, hypothyroidism  Subjective: Pt drowsy and required cues Assessment / Plan / Recommendation CHL IP CLINICAL IMPRESSIONS 11/11/2020 Clinical Impression  Today's MBS was limited due to pt's level of alertness despite Max multimodal cues and encouragement from SLP. Pt presents with an oropharyngeal dysphagia in which her oral phase is characterized by reduced lip seal and lingual control, resulting in anterior loss of saliva  Physician Discharge Summary  [name] QPY:[id number] DOB: 1933-11-03 DOA: 11/03/2020  PCP: Nicoletta Dress, MD  Admit date: 11/03/2020 Discharge date: 11/12/2020  Time spent: 35 minutes  Recommendations for Outpatient Follow-up:  Resume home health PT OT RN, continue tube feeds and meds via PEG tube Palliative care follow-up, continue goals of care discussions, prognosis is very poor Routine catheter care, change Foley every 30 days PCP in 1 week   Discharge Diagnoses:  Vascular dementia Dysphagia Severe hyponatremia Pseudomonas UTI Chronic Foley Urinary retention History of ischemic stroke with left-sided weakness Adult failure to thrive Moderate protein calorie malnutrition   Hyponatremia   Hypotension   Hyperkalemia   Discharge Condition: Stable  Diet recommendation: N.p.o., tube feeds, free water  [employer]   11/03/20 1729  Weight: 83 kg    History of present illness:  87 year old female with history of dementia, multiple strokes with left-sided weakness on Eliquis, chronic dysphagia with G-tube in place, hypertension, chronic urine retention status post Foley catheter since 2021 November, hypothyroidism, chronic debility patient able to communicate with family members uses peddled mini bike to ground, able to eat some solid food but usually not enough liquids which she consumes via G-tube, she was brought to the ED due to multiple episodes of vomiting, subsequently he also appeared more lethargic, has son switched her over to tube feeds eventually and then she developed worsening confusion. -She also has chronic urinary retention and had a Foley placed in November/21 which gets exchanged every 30 days recently treated with 5 days of Macrobid for presumed UTI 2 days prior to admission -In the ED she was noted to be hyponatremic with serum sodium of 110 she was admitted to the ICU, nephrology was consulted, on 2/24 she was started on hypertonic  saline  Hospital Course:   Subacute hyponatremia, with mild hypovolemia due to GI loss and possibly low solute intake:   -Urine sod <10,TSH okay osmol 108,  -patient was transferred to ICU on hypertonic saline.Off 3% saline 2/25 AM.Sodium improved and holding stable at 130.  -Off IV fluids now, started on tube feeds and free water via NG tube -Patient is not to resume Lexapro or black elderberry per nephrology -Sodium improved and stable now  Metabolic encephalopathy Suspected vascular dementia Dysphagia -At baseline has memory deficits, likely component of vascular dementia from multiple prior strokes, exacerbated in the setting of hyponatremia and long hospitalization -Had a PEG tube prior to admission,  son was only using this for supplemental fluids prior to admission, however now with fluctuating mental status she is mostly tube feed dependent  Chronic dysphagia with G-tube in place since 01/03/2018, nursing noticed problem with G-tube question discoloration-mold/old food --IR consulted for possible PEG tube exchange -This was completed 3/2, resume tube feeds  -Failed swallow evaluation, since she already had a PEG tube from 2 years ago, she was started on tube feeds and meds via PEG tube  Edema Third spacing, hypoalbuminemia -Blood pressure is more stable however she has profound 3 third spacing, discontinued IV fluids and lisinopril  -Start low-dose Lasix  Pseudomonas UTI  - in the setting of chronic indwelling Foley catheter, -Foley was exchanged 2/22 -Completed 7 days of cefepime, antibiotics discontinued 3/3  Hyperkalemia resolved  Hx of ischemic Stroke with left-sided weakness - At baseline knows her name and date of birth - Predominantly total care at home - CT head that also showed large chronic right MCA infarct, small chronic infarct in the right cerebellum and chronic

## 2020-11-13 DIAGNOSIS — Z8701 Personal history of pneumonia (recurrent): Secondary | ICD-10-CM | POA: Diagnosis not present

## 2020-11-13 DIAGNOSIS — E871 Hypo-osmolality and hyponatremia: Secondary | ICD-10-CM | POA: Diagnosis not present

## 2020-11-13 DIAGNOSIS — I69311 Memory deficit following cerebral infarction: Secondary | ICD-10-CM | POA: Diagnosis not present

## 2020-11-13 DIAGNOSIS — I69354 Hemiplegia and hemiparesis following cerebral infarction affecting left non-dominant side: Secondary | ICD-10-CM | POA: Diagnosis not present

## 2020-11-13 DIAGNOSIS — Z9181 History of falling: Secondary | ICD-10-CM | POA: Diagnosis not present

## 2020-11-13 DIAGNOSIS — L89322 Pressure ulcer of left buttock, stage 2: Secondary | ICD-10-CM | POA: Diagnosis not present

## 2020-11-13 DIAGNOSIS — M1990 Unspecified osteoarthritis, unspecified site: Secondary | ICD-10-CM | POA: Diagnosis not present

## 2020-11-13 DIAGNOSIS — E785 Hyperlipidemia, unspecified: Secondary | ICD-10-CM | POA: Diagnosis not present

## 2020-11-13 DIAGNOSIS — R011 Cardiac murmur, unspecified: Secondary | ICD-10-CM | POA: Diagnosis not present

## 2020-11-13 DIAGNOSIS — Z7901 Long term (current) use of anticoagulants: Secondary | ICD-10-CM | POA: Diagnosis not present

## 2020-11-13 DIAGNOSIS — Z431 Encounter for attention to gastrostomy: Secondary | ICD-10-CM | POA: Diagnosis not present

## 2020-11-13 DIAGNOSIS — D638 Anemia in other chronic diseases classified elsewhere: Secondary | ICD-10-CM | POA: Diagnosis not present

## 2020-11-13 DIAGNOSIS — Z87440 Personal history of urinary (tract) infections: Secondary | ICD-10-CM | POA: Diagnosis not present

## 2020-11-13 DIAGNOSIS — I10 Essential (primary) hypertension: Secondary | ICD-10-CM | POA: Diagnosis not present

## 2020-11-13 DIAGNOSIS — Z7401 Bed confinement status: Secondary | ICD-10-CM | POA: Diagnosis not present

## 2020-11-13 DIAGNOSIS — E441 Mild protein-calorie malnutrition: Secondary | ICD-10-CM | POA: Diagnosis not present

## 2020-11-13 DIAGNOSIS — Z90710 Acquired absence of both cervix and uterus: Secondary | ICD-10-CM | POA: Diagnosis not present

## 2020-11-13 DIAGNOSIS — R627 Adult failure to thrive: Secondary | ICD-10-CM | POA: Diagnosis not present

## 2020-11-13 DIAGNOSIS — I69391 Dysphagia following cerebral infarction: Secondary | ICD-10-CM | POA: Diagnosis not present

## 2020-11-13 DIAGNOSIS — R339 Retention of urine, unspecified: Secondary | ICD-10-CM | POA: Diagnosis not present

## 2020-11-13 DIAGNOSIS — Z466 Encounter for fitting and adjustment of urinary device: Secondary | ICD-10-CM | POA: Diagnosis not present

## 2020-11-13 DIAGNOSIS — E039 Hypothyroidism, unspecified: Secondary | ICD-10-CM | POA: Diagnosis not present

## 2020-11-16 DIAGNOSIS — I69354 Hemiplegia and hemiparesis following cerebral infarction affecting left non-dominant side: Secondary | ICD-10-CM | POA: Diagnosis not present

## 2020-11-16 DIAGNOSIS — Z431 Encounter for attention to gastrostomy: Secondary | ICD-10-CM | POA: Diagnosis not present

## 2020-11-16 DIAGNOSIS — E871 Hypo-osmolality and hyponatremia: Secondary | ICD-10-CM | POA: Diagnosis not present

## 2020-11-16 DIAGNOSIS — Z466 Encounter for fitting and adjustment of urinary device: Secondary | ICD-10-CM | POA: Diagnosis not present

## 2020-11-16 DIAGNOSIS — I69391 Dysphagia following cerebral infarction: Secondary | ICD-10-CM | POA: Diagnosis not present

## 2020-11-16 DIAGNOSIS — I69311 Memory deficit following cerebral infarction: Secondary | ICD-10-CM | POA: Diagnosis not present

## 2020-11-22 DIAGNOSIS — I69311 Memory deficit following cerebral infarction: Secondary | ICD-10-CM | POA: Diagnosis not present

## 2020-11-22 DIAGNOSIS — I69391 Dysphagia following cerebral infarction: Secondary | ICD-10-CM | POA: Diagnosis not present

## 2020-11-22 DIAGNOSIS — E871 Hypo-osmolality and hyponatremia: Secondary | ICD-10-CM | POA: Diagnosis not present

## 2020-11-22 DIAGNOSIS — I69354 Hemiplegia and hemiparesis following cerebral infarction affecting left non-dominant side: Secondary | ICD-10-CM | POA: Diagnosis not present

## 2020-11-22 DIAGNOSIS — Z431 Encounter for attention to gastrostomy: Secondary | ICD-10-CM | POA: Diagnosis not present

## 2020-11-22 DIAGNOSIS — Z466 Encounter for fitting and adjustment of urinary device: Secondary | ICD-10-CM | POA: Diagnosis not present

## 2020-11-24 DIAGNOSIS — I69311 Memory deficit following cerebral infarction: Secondary | ICD-10-CM | POA: Diagnosis not present

## 2020-11-24 DIAGNOSIS — Z466 Encounter for fitting and adjustment of urinary device: Secondary | ICD-10-CM | POA: Diagnosis not present

## 2020-11-24 DIAGNOSIS — Z431 Encounter for attention to gastrostomy: Secondary | ICD-10-CM | POA: Diagnosis not present

## 2020-11-24 DIAGNOSIS — E871 Hypo-osmolality and hyponatremia: Secondary | ICD-10-CM | POA: Diagnosis not present

## 2020-11-24 DIAGNOSIS — I69354 Hemiplegia and hemiparesis following cerebral infarction affecting left non-dominant side: Secondary | ICD-10-CM | POA: Diagnosis not present

## 2020-11-24 DIAGNOSIS — I69391 Dysphagia following cerebral infarction: Secondary | ICD-10-CM | POA: Diagnosis not present

## 2020-11-28 DIAGNOSIS — Z466 Encounter for fitting and adjustment of urinary device: Secondary | ICD-10-CM | POA: Diagnosis not present

## 2020-11-28 DIAGNOSIS — I69311 Memory deficit following cerebral infarction: Secondary | ICD-10-CM | POA: Diagnosis not present

## 2020-11-28 DIAGNOSIS — I69354 Hemiplegia and hemiparesis following cerebral infarction affecting left non-dominant side: Secondary | ICD-10-CM | POA: Diagnosis not present

## 2020-11-28 DIAGNOSIS — Z431 Encounter for attention to gastrostomy: Secondary | ICD-10-CM | POA: Diagnosis not present

## 2020-11-28 DIAGNOSIS — E871 Hypo-osmolality and hyponatremia: Secondary | ICD-10-CM | POA: Diagnosis not present

## 2020-11-28 DIAGNOSIS — I69391 Dysphagia following cerebral infarction: Secondary | ICD-10-CM | POA: Diagnosis not present

## 2020-11-29 DIAGNOSIS — E871 Hypo-osmolality and hyponatremia: Secondary | ICD-10-CM | POA: Diagnosis not present

## 2020-11-29 DIAGNOSIS — I69311 Memory deficit following cerebral infarction: Secondary | ICD-10-CM | POA: Diagnosis not present

## 2020-11-29 DIAGNOSIS — Z431 Encounter for attention to gastrostomy: Secondary | ICD-10-CM | POA: Diagnosis not present

## 2020-11-29 DIAGNOSIS — I69391 Dysphagia following cerebral infarction: Secondary | ICD-10-CM | POA: Diagnosis not present

## 2020-11-29 DIAGNOSIS — I69354 Hemiplegia and hemiparesis following cerebral infarction affecting left non-dominant side: Secondary | ICD-10-CM | POA: Diagnosis not present

## 2020-11-29 DIAGNOSIS — Z466 Encounter for fitting and adjustment of urinary device: Secondary | ICD-10-CM | POA: Diagnosis not present

## 2020-12-02 DIAGNOSIS — I69311 Memory deficit following cerebral infarction: Secondary | ICD-10-CM | POA: Diagnosis not present

## 2020-12-02 DIAGNOSIS — Z431 Encounter for attention to gastrostomy: Secondary | ICD-10-CM | POA: Diagnosis not present

## 2020-12-02 DIAGNOSIS — Z466 Encounter for fitting and adjustment of urinary device: Secondary | ICD-10-CM | POA: Diagnosis not present

## 2020-12-02 DIAGNOSIS — I69391 Dysphagia following cerebral infarction: Secondary | ICD-10-CM | POA: Diagnosis not present

## 2020-12-02 DIAGNOSIS — E871 Hypo-osmolality and hyponatremia: Secondary | ICD-10-CM | POA: Diagnosis not present

## 2020-12-02 DIAGNOSIS — I69354 Hemiplegia and hemiparesis following cerebral infarction affecting left non-dominant side: Secondary | ICD-10-CM | POA: Diagnosis not present

## 2020-12-06 DIAGNOSIS — I69354 Hemiplegia and hemiparesis following cerebral infarction affecting left non-dominant side: Secondary | ICD-10-CM | POA: Diagnosis not present

## 2020-12-06 DIAGNOSIS — E871 Hypo-osmolality and hyponatremia: Secondary | ICD-10-CM | POA: Diagnosis not present

## 2020-12-06 DIAGNOSIS — Z431 Encounter for attention to gastrostomy: Secondary | ICD-10-CM | POA: Diagnosis not present

## 2020-12-06 DIAGNOSIS — I69311 Memory deficit following cerebral infarction: Secondary | ICD-10-CM | POA: Diagnosis not present

## 2020-12-06 DIAGNOSIS — I69391 Dysphagia following cerebral infarction: Secondary | ICD-10-CM | POA: Diagnosis not present

## 2020-12-06 DIAGNOSIS — Z466 Encounter for fitting and adjustment of urinary device: Secondary | ICD-10-CM | POA: Diagnosis not present

## 2020-12-09 DIAGNOSIS — I69391 Dysphagia following cerebral infarction: Secondary | ICD-10-CM | POA: Diagnosis not present

## 2020-12-09 DIAGNOSIS — Z431 Encounter for attention to gastrostomy: Secondary | ICD-10-CM | POA: Diagnosis not present

## 2020-12-09 DIAGNOSIS — I69354 Hemiplegia and hemiparesis following cerebral infarction affecting left non-dominant side: Secondary | ICD-10-CM | POA: Diagnosis not present

## 2020-12-09 DIAGNOSIS — Z466 Encounter for fitting and adjustment of urinary device: Secondary | ICD-10-CM | POA: Diagnosis not present

## 2020-12-09 DIAGNOSIS — I69311 Memory deficit following cerebral infarction: Secondary | ICD-10-CM | POA: Diagnosis not present

## 2020-12-09 DIAGNOSIS — E871 Hypo-osmolality and hyponatremia: Secondary | ICD-10-CM | POA: Diagnosis not present

## 2020-12-12 DIAGNOSIS — I69311 Memory deficit following cerebral infarction: Secondary | ICD-10-CM | POA: Diagnosis not present

## 2020-12-12 DIAGNOSIS — E871 Hypo-osmolality and hyponatremia: Secondary | ICD-10-CM | POA: Diagnosis not present

## 2020-12-12 DIAGNOSIS — I69391 Dysphagia following cerebral infarction: Secondary | ICD-10-CM | POA: Diagnosis not present

## 2020-12-12 DIAGNOSIS — Z466 Encounter for fitting and adjustment of urinary device: Secondary | ICD-10-CM | POA: Diagnosis not present

## 2020-12-12 DIAGNOSIS — Z431 Encounter for attention to gastrostomy: Secondary | ICD-10-CM | POA: Diagnosis not present

## 2020-12-12 DIAGNOSIS — I69354 Hemiplegia and hemiparesis following cerebral infarction affecting left non-dominant side: Secondary | ICD-10-CM | POA: Diagnosis not present

## 2020-12-13 DIAGNOSIS — Z7401 Bed confinement status: Secondary | ICD-10-CM | POA: Diagnosis not present

## 2020-12-13 DIAGNOSIS — Z87440 Personal history of urinary (tract) infections: Secondary | ICD-10-CM | POA: Diagnosis not present

## 2020-12-13 DIAGNOSIS — I69391 Dysphagia following cerebral infarction: Secondary | ICD-10-CM | POA: Diagnosis not present

## 2020-12-13 DIAGNOSIS — I69354 Hemiplegia and hemiparesis following cerebral infarction affecting left non-dominant side: Secondary | ICD-10-CM | POA: Diagnosis not present

## 2020-12-13 DIAGNOSIS — R627 Adult failure to thrive: Secondary | ICD-10-CM | POA: Diagnosis not present

## 2020-12-13 DIAGNOSIS — I10 Essential (primary) hypertension: Secondary | ICD-10-CM | POA: Diagnosis not present

## 2020-12-13 DIAGNOSIS — L89322 Pressure ulcer of left buttock, stage 2: Secondary | ICD-10-CM | POA: Diagnosis not present

## 2020-12-13 DIAGNOSIS — R339 Retention of urine, unspecified: Secondary | ICD-10-CM | POA: Diagnosis not present

## 2020-12-13 DIAGNOSIS — R011 Cardiac murmur, unspecified: Secondary | ICD-10-CM | POA: Diagnosis not present

## 2020-12-13 DIAGNOSIS — Z466 Encounter for fitting and adjustment of urinary device: Secondary | ICD-10-CM | POA: Diagnosis not present

## 2020-12-13 DIAGNOSIS — Z8701 Personal history of pneumonia (recurrent): Secondary | ICD-10-CM | POA: Diagnosis not present

## 2020-12-13 DIAGNOSIS — I69311 Memory deficit following cerebral infarction: Secondary | ICD-10-CM | POA: Diagnosis not present

## 2020-12-13 DIAGNOSIS — E871 Hypo-osmolality and hyponatremia: Secondary | ICD-10-CM | POA: Diagnosis not present

## 2020-12-13 DIAGNOSIS — Z431 Encounter for attention to gastrostomy: Secondary | ICD-10-CM | POA: Diagnosis not present

## 2020-12-13 DIAGNOSIS — E039 Hypothyroidism, unspecified: Secondary | ICD-10-CM | POA: Diagnosis not present

## 2020-12-13 DIAGNOSIS — Z7901 Long term (current) use of anticoagulants: Secondary | ICD-10-CM | POA: Diagnosis not present

## 2020-12-13 DIAGNOSIS — E785 Hyperlipidemia, unspecified: Secondary | ICD-10-CM | POA: Diagnosis not present

## 2020-12-13 DIAGNOSIS — D638 Anemia in other chronic diseases classified elsewhere: Secondary | ICD-10-CM | POA: Diagnosis not present

## 2020-12-13 DIAGNOSIS — Z90710 Acquired absence of both cervix and uterus: Secondary | ICD-10-CM | POA: Diagnosis not present

## 2020-12-13 DIAGNOSIS — Z9181 History of falling: Secondary | ICD-10-CM | POA: Diagnosis not present

## 2020-12-13 DIAGNOSIS — E441 Mild protein-calorie malnutrition: Secondary | ICD-10-CM | POA: Diagnosis not present

## 2020-12-13 DIAGNOSIS — M1990 Unspecified osteoarthritis, unspecified site: Secondary | ICD-10-CM | POA: Diagnosis not present

## 2020-12-14 DIAGNOSIS — I69391 Dysphagia following cerebral infarction: Secondary | ICD-10-CM | POA: Diagnosis not present

## 2020-12-14 DIAGNOSIS — I69311 Memory deficit following cerebral infarction: Secondary | ICD-10-CM | POA: Diagnosis not present

## 2020-12-14 DIAGNOSIS — Z431 Encounter for attention to gastrostomy: Secondary | ICD-10-CM | POA: Diagnosis not present

## 2020-12-14 DIAGNOSIS — E871 Hypo-osmolality and hyponatremia: Secondary | ICD-10-CM | POA: Diagnosis not present

## 2020-12-14 DIAGNOSIS — Z466 Encounter for fitting and adjustment of urinary device: Secondary | ICD-10-CM | POA: Diagnosis not present

## 2020-12-14 DIAGNOSIS — I69354 Hemiplegia and hemiparesis following cerebral infarction affecting left non-dominant side: Secondary | ICD-10-CM | POA: Diagnosis not present

## 2020-12-16 DIAGNOSIS — Z431 Encounter for attention to gastrostomy: Secondary | ICD-10-CM | POA: Diagnosis not present

## 2020-12-16 DIAGNOSIS — E871 Hypo-osmolality and hyponatremia: Secondary | ICD-10-CM | POA: Diagnosis not present

## 2020-12-16 DIAGNOSIS — I69311 Memory deficit following cerebral infarction: Secondary | ICD-10-CM | POA: Diagnosis not present

## 2020-12-16 DIAGNOSIS — Z466 Encounter for fitting and adjustment of urinary device: Secondary | ICD-10-CM | POA: Diagnosis not present

## 2020-12-16 DIAGNOSIS — I69391 Dysphagia following cerebral infarction: Secondary | ICD-10-CM | POA: Diagnosis not present

## 2020-12-16 DIAGNOSIS — I69354 Hemiplegia and hemiparesis following cerebral infarction affecting left non-dominant side: Secondary | ICD-10-CM | POA: Diagnosis not present

## 2020-12-20 DIAGNOSIS — Z431 Encounter for attention to gastrostomy: Secondary | ICD-10-CM | POA: Diagnosis not present

## 2020-12-20 DIAGNOSIS — E871 Hypo-osmolality and hyponatremia: Secondary | ICD-10-CM | POA: Diagnosis not present

## 2020-12-20 DIAGNOSIS — I69354 Hemiplegia and hemiparesis following cerebral infarction affecting left non-dominant side: Secondary | ICD-10-CM | POA: Diagnosis not present

## 2020-12-20 DIAGNOSIS — Z466 Encounter for fitting and adjustment of urinary device: Secondary | ICD-10-CM | POA: Diagnosis not present

## 2020-12-20 DIAGNOSIS — I69311 Memory deficit following cerebral infarction: Secondary | ICD-10-CM | POA: Diagnosis not present

## 2020-12-20 DIAGNOSIS — I69391 Dysphagia following cerebral infarction: Secondary | ICD-10-CM | POA: Diagnosis not present

## 2020-12-22 DIAGNOSIS — Z431 Encounter for attention to gastrostomy: Secondary | ICD-10-CM | POA: Diagnosis not present

## 2020-12-22 DIAGNOSIS — E871 Hypo-osmolality and hyponatremia: Secondary | ICD-10-CM | POA: Diagnosis not present

## 2020-12-22 DIAGNOSIS — I69354 Hemiplegia and hemiparesis following cerebral infarction affecting left non-dominant side: Secondary | ICD-10-CM | POA: Diagnosis not present

## 2020-12-22 DIAGNOSIS — Z466 Encounter for fitting and adjustment of urinary device: Secondary | ICD-10-CM | POA: Diagnosis not present

## 2020-12-22 DIAGNOSIS — I69391 Dysphagia following cerebral infarction: Secondary | ICD-10-CM | POA: Diagnosis not present

## 2020-12-22 DIAGNOSIS — I69311 Memory deficit following cerebral infarction: Secondary | ICD-10-CM | POA: Diagnosis not present

## 2020-12-23 DIAGNOSIS — E871 Hypo-osmolality and hyponatremia: Secondary | ICD-10-CM | POA: Diagnosis not present

## 2020-12-23 DIAGNOSIS — I69354 Hemiplegia and hemiparesis following cerebral infarction affecting left non-dominant side: Secondary | ICD-10-CM | POA: Diagnosis not present

## 2020-12-23 DIAGNOSIS — I69311 Memory deficit following cerebral infarction: Secondary | ICD-10-CM | POA: Diagnosis not present

## 2020-12-23 DIAGNOSIS — Z466 Encounter for fitting and adjustment of urinary device: Secondary | ICD-10-CM | POA: Diagnosis not present

## 2020-12-23 DIAGNOSIS — Z431 Encounter for attention to gastrostomy: Secondary | ICD-10-CM | POA: Diagnosis not present

## 2020-12-23 DIAGNOSIS — I69391 Dysphagia following cerebral infarction: Secondary | ICD-10-CM | POA: Diagnosis not present

## 2020-12-27 DIAGNOSIS — I69391 Dysphagia following cerebral infarction: Secondary | ICD-10-CM | POA: Diagnosis not present

## 2020-12-27 DIAGNOSIS — E871 Hypo-osmolality and hyponatremia: Secondary | ICD-10-CM | POA: Diagnosis not present

## 2020-12-27 DIAGNOSIS — I69354 Hemiplegia and hemiparesis following cerebral infarction affecting left non-dominant side: Secondary | ICD-10-CM | POA: Diagnosis not present

## 2020-12-27 DIAGNOSIS — Z466 Encounter for fitting and adjustment of urinary device: Secondary | ICD-10-CM | POA: Diagnosis not present

## 2020-12-27 DIAGNOSIS — Z431 Encounter for attention to gastrostomy: Secondary | ICD-10-CM | POA: Diagnosis not present

## 2020-12-27 DIAGNOSIS — I69311 Memory deficit following cerebral infarction: Secondary | ICD-10-CM | POA: Diagnosis not present

## 2021-01-05 DIAGNOSIS — I69391 Dysphagia following cerebral infarction: Secondary | ICD-10-CM | POA: Diagnosis not present

## 2021-01-05 DIAGNOSIS — I69354 Hemiplegia and hemiparesis following cerebral infarction affecting left non-dominant side: Secondary | ICD-10-CM | POA: Diagnosis not present

## 2021-01-05 DIAGNOSIS — I69311 Memory deficit following cerebral infarction: Secondary | ICD-10-CM | POA: Diagnosis not present

## 2021-01-05 DIAGNOSIS — Z466 Encounter for fitting and adjustment of urinary device: Secondary | ICD-10-CM | POA: Diagnosis not present

## 2021-01-05 DIAGNOSIS — Z431 Encounter for attention to gastrostomy: Secondary | ICD-10-CM | POA: Diagnosis not present

## 2021-01-05 DIAGNOSIS — E871 Hypo-osmolality and hyponatremia: Secondary | ICD-10-CM | POA: Diagnosis not present

## 2021-01-06 DIAGNOSIS — I69311 Memory deficit following cerebral infarction: Secondary | ICD-10-CM | POA: Diagnosis not present

## 2021-01-06 DIAGNOSIS — I69391 Dysphagia following cerebral infarction: Secondary | ICD-10-CM | POA: Diagnosis not present

## 2021-01-06 DIAGNOSIS — I69354 Hemiplegia and hemiparesis following cerebral infarction affecting left non-dominant side: Secondary | ICD-10-CM | POA: Diagnosis not present

## 2021-01-06 DIAGNOSIS — Z466 Encounter for fitting and adjustment of urinary device: Secondary | ICD-10-CM | POA: Diagnosis not present

## 2021-01-06 DIAGNOSIS — Z431 Encounter for attention to gastrostomy: Secondary | ICD-10-CM | POA: Diagnosis not present

## 2021-01-06 DIAGNOSIS — E871 Hypo-osmolality and hyponatremia: Secondary | ICD-10-CM | POA: Diagnosis not present

## 2021-01-10 DIAGNOSIS — E871 Hypo-osmolality and hyponatremia: Secondary | ICD-10-CM | POA: Diagnosis not present

## 2021-01-10 DIAGNOSIS — I69311 Memory deficit following cerebral infarction: Secondary | ICD-10-CM | POA: Diagnosis not present

## 2021-01-10 DIAGNOSIS — Z466 Encounter for fitting and adjustment of urinary device: Secondary | ICD-10-CM | POA: Diagnosis not present

## 2021-01-10 DIAGNOSIS — I69391 Dysphagia following cerebral infarction: Secondary | ICD-10-CM | POA: Diagnosis not present

## 2021-01-10 DIAGNOSIS — I69354 Hemiplegia and hemiparesis following cerebral infarction affecting left non-dominant side: Secondary | ICD-10-CM | POA: Diagnosis not present

## 2021-01-10 DIAGNOSIS — Z431 Encounter for attention to gastrostomy: Secondary | ICD-10-CM | POA: Diagnosis not present

## 2021-01-11 DIAGNOSIS — Z431 Encounter for attention to gastrostomy: Secondary | ICD-10-CM | POA: Diagnosis not present

## 2021-01-11 DIAGNOSIS — I69391 Dysphagia following cerebral infarction: Secondary | ICD-10-CM | POA: Diagnosis not present

## 2021-01-11 DIAGNOSIS — I69311 Memory deficit following cerebral infarction: Secondary | ICD-10-CM | POA: Diagnosis not present

## 2021-01-11 DIAGNOSIS — I69354 Hemiplegia and hemiparesis following cerebral infarction affecting left non-dominant side: Secondary | ICD-10-CM | POA: Diagnosis not present

## 2021-01-11 DIAGNOSIS — E871 Hypo-osmolality and hyponatremia: Secondary | ICD-10-CM | POA: Diagnosis not present

## 2021-01-11 DIAGNOSIS — Z466 Encounter for fitting and adjustment of urinary device: Secondary | ICD-10-CM | POA: Diagnosis not present

## 2021-01-12 DIAGNOSIS — Z431 Encounter for attention to gastrostomy: Secondary | ICD-10-CM | POA: Diagnosis not present

## 2021-01-12 DIAGNOSIS — I10 Essential (primary) hypertension: Secondary | ICD-10-CM | POA: Diagnosis not present

## 2021-01-12 DIAGNOSIS — R627 Adult failure to thrive: Secondary | ICD-10-CM | POA: Diagnosis not present

## 2021-01-12 DIAGNOSIS — Z8701 Personal history of pneumonia (recurrent): Secondary | ICD-10-CM | POA: Diagnosis not present

## 2021-01-12 DIAGNOSIS — Z9181 History of falling: Secondary | ICD-10-CM | POA: Diagnosis not present

## 2021-01-12 DIAGNOSIS — I69391 Dysphagia following cerebral infarction: Secondary | ICD-10-CM | POA: Diagnosis not present

## 2021-01-12 DIAGNOSIS — F0280 Dementia in other diseases classified elsewhere without behavioral disturbance: Secondary | ICD-10-CM | POA: Diagnosis not present

## 2021-01-12 DIAGNOSIS — Z466 Encounter for fitting and adjustment of urinary device: Secondary | ICD-10-CM | POA: Diagnosis not present

## 2021-01-12 DIAGNOSIS — R011 Cardiac murmur, unspecified: Secondary | ICD-10-CM | POA: Diagnosis not present

## 2021-01-12 DIAGNOSIS — E785 Hyperlipidemia, unspecified: Secondary | ICD-10-CM | POA: Diagnosis not present

## 2021-01-12 DIAGNOSIS — Z6828 Body mass index (BMI) 28.0-28.9, adult: Secondary | ICD-10-CM | POA: Diagnosis not present

## 2021-01-12 DIAGNOSIS — E43 Unspecified severe protein-calorie malnutrition: Secondary | ICD-10-CM | POA: Diagnosis not present

## 2021-01-12 DIAGNOSIS — Z87440 Personal history of urinary (tract) infections: Secondary | ICD-10-CM | POA: Diagnosis not present

## 2021-01-12 DIAGNOSIS — E039 Hypothyroidism, unspecified: Secondary | ICD-10-CM | POA: Diagnosis not present

## 2021-01-12 DIAGNOSIS — E871 Hypo-osmolality and hyponatremia: Secondary | ICD-10-CM | POA: Diagnosis not present

## 2021-01-12 DIAGNOSIS — I69311 Memory deficit following cerebral infarction: Secondary | ICD-10-CM | POA: Diagnosis not present

## 2021-01-12 DIAGNOSIS — R339 Retention of urine, unspecified: Secondary | ICD-10-CM | POA: Diagnosis not present

## 2021-01-12 DIAGNOSIS — Z90710 Acquired absence of both cervix and uterus: Secondary | ICD-10-CM | POA: Diagnosis not present

## 2021-01-12 DIAGNOSIS — I69354 Hemiplegia and hemiparesis following cerebral infarction affecting left non-dominant side: Secondary | ICD-10-CM | POA: Diagnosis not present

## 2021-01-12 DIAGNOSIS — D638 Anemia in other chronic diseases classified elsewhere: Secondary | ICD-10-CM | POA: Diagnosis not present

## 2021-01-12 DIAGNOSIS — M1990 Unspecified osteoarthritis, unspecified site: Secondary | ICD-10-CM | POA: Diagnosis not present

## 2021-01-12 DIAGNOSIS — Z7901 Long term (current) use of anticoagulants: Secondary | ICD-10-CM | POA: Diagnosis not present

## 2021-01-12 DIAGNOSIS — Z7401 Bed confinement status: Secondary | ICD-10-CM | POA: Diagnosis not present

## 2021-01-12 DIAGNOSIS — E7800 Pure hypercholesterolemia, unspecified: Secondary | ICD-10-CM | POA: Diagnosis not present

## 2021-01-16 DIAGNOSIS — I69311 Memory deficit following cerebral infarction: Secondary | ICD-10-CM | POA: Diagnosis not present

## 2021-01-16 DIAGNOSIS — I69391 Dysphagia following cerebral infarction: Secondary | ICD-10-CM | POA: Diagnosis not present

## 2021-01-16 DIAGNOSIS — Z466 Encounter for fitting and adjustment of urinary device: Secondary | ICD-10-CM | POA: Diagnosis not present

## 2021-01-16 DIAGNOSIS — I69354 Hemiplegia and hemiparesis following cerebral infarction affecting left non-dominant side: Secondary | ICD-10-CM | POA: Diagnosis not present

## 2021-01-16 DIAGNOSIS — E871 Hypo-osmolality and hyponatremia: Secondary | ICD-10-CM | POA: Diagnosis not present

## 2021-01-16 DIAGNOSIS — R339 Retention of urine, unspecified: Secondary | ICD-10-CM | POA: Diagnosis not present

## 2021-01-24 DIAGNOSIS — E871 Hypo-osmolality and hyponatremia: Secondary | ICD-10-CM | POA: Diagnosis not present

## 2021-01-24 DIAGNOSIS — I69311 Memory deficit following cerebral infarction: Secondary | ICD-10-CM | POA: Diagnosis not present

## 2021-01-24 DIAGNOSIS — I69391 Dysphagia following cerebral infarction: Secondary | ICD-10-CM | POA: Diagnosis not present

## 2021-01-24 DIAGNOSIS — R339 Retention of urine, unspecified: Secondary | ICD-10-CM | POA: Diagnosis not present

## 2021-01-24 DIAGNOSIS — Z466 Encounter for fitting and adjustment of urinary device: Secondary | ICD-10-CM | POA: Diagnosis not present

## 2021-01-24 DIAGNOSIS — I69354 Hemiplegia and hemiparesis following cerebral infarction affecting left non-dominant side: Secondary | ICD-10-CM | POA: Diagnosis not present

## 2021-01-31 DIAGNOSIS — I69311 Memory deficit following cerebral infarction: Secondary | ICD-10-CM | POA: Diagnosis not present

## 2021-01-31 DIAGNOSIS — I69354 Hemiplegia and hemiparesis following cerebral infarction affecting left non-dominant side: Secondary | ICD-10-CM | POA: Diagnosis not present

## 2021-01-31 DIAGNOSIS — Z466 Encounter for fitting and adjustment of urinary device: Secondary | ICD-10-CM | POA: Diagnosis not present

## 2021-01-31 DIAGNOSIS — E871 Hypo-osmolality and hyponatremia: Secondary | ICD-10-CM | POA: Diagnosis not present

## 2021-01-31 DIAGNOSIS — I69391 Dysphagia following cerebral infarction: Secondary | ICD-10-CM | POA: Diagnosis not present

## 2021-01-31 DIAGNOSIS — R339 Retention of urine, unspecified: Secondary | ICD-10-CM | POA: Diagnosis not present

## 2021-02-07 DIAGNOSIS — E871 Hypo-osmolality and hyponatremia: Secondary | ICD-10-CM | POA: Diagnosis not present

## 2021-02-07 DIAGNOSIS — I69391 Dysphagia following cerebral infarction: Secondary | ICD-10-CM | POA: Diagnosis not present

## 2021-02-07 DIAGNOSIS — I69354 Hemiplegia and hemiparesis following cerebral infarction affecting left non-dominant side: Secondary | ICD-10-CM | POA: Diagnosis not present

## 2021-02-07 DIAGNOSIS — R339 Retention of urine, unspecified: Secondary | ICD-10-CM | POA: Diagnosis not present

## 2021-02-07 DIAGNOSIS — Z466 Encounter for fitting and adjustment of urinary device: Secondary | ICD-10-CM | POA: Diagnosis not present

## 2021-02-07 DIAGNOSIS — I69311 Memory deficit following cerebral infarction: Secondary | ICD-10-CM | POA: Diagnosis not present

## 2021-02-11 DIAGNOSIS — E43 Unspecified severe protein-calorie malnutrition: Secondary | ICD-10-CM | POA: Diagnosis not present

## 2021-02-11 DIAGNOSIS — Z6828 Body mass index (BMI) 28.0-28.9, adult: Secondary | ICD-10-CM | POA: Diagnosis not present

## 2021-02-11 DIAGNOSIS — Z9181 History of falling: Secondary | ICD-10-CM | POA: Diagnosis not present

## 2021-02-11 DIAGNOSIS — E785 Hyperlipidemia, unspecified: Secondary | ICD-10-CM | POA: Diagnosis not present

## 2021-02-11 DIAGNOSIS — Z7901 Long term (current) use of anticoagulants: Secondary | ICD-10-CM | POA: Diagnosis not present

## 2021-02-11 DIAGNOSIS — I69354 Hemiplegia and hemiparesis following cerebral infarction affecting left non-dominant side: Secondary | ICD-10-CM | POA: Diagnosis not present

## 2021-02-11 DIAGNOSIS — I10 Essential (primary) hypertension: Secondary | ICD-10-CM | POA: Diagnosis not present

## 2021-02-11 DIAGNOSIS — E871 Hypo-osmolality and hyponatremia: Secondary | ICD-10-CM | POA: Diagnosis not present

## 2021-02-11 DIAGNOSIS — I69311 Memory deficit following cerebral infarction: Secondary | ICD-10-CM | POA: Diagnosis not present

## 2021-02-11 DIAGNOSIS — M1990 Unspecified osteoarthritis, unspecified site: Secondary | ICD-10-CM | POA: Diagnosis not present

## 2021-02-11 DIAGNOSIS — Z87440 Personal history of urinary (tract) infections: Secondary | ICD-10-CM | POA: Diagnosis not present

## 2021-02-11 DIAGNOSIS — E7800 Pure hypercholesterolemia, unspecified: Secondary | ICD-10-CM | POA: Diagnosis not present

## 2021-02-11 DIAGNOSIS — R339 Retention of urine, unspecified: Secondary | ICD-10-CM | POA: Diagnosis not present

## 2021-02-11 DIAGNOSIS — D638 Anemia in other chronic diseases classified elsewhere: Secondary | ICD-10-CM | POA: Diagnosis not present

## 2021-02-11 DIAGNOSIS — F0280 Dementia in other diseases classified elsewhere without behavioral disturbance: Secondary | ICD-10-CM | POA: Diagnosis not present

## 2021-02-11 DIAGNOSIS — E039 Hypothyroidism, unspecified: Secondary | ICD-10-CM | POA: Diagnosis not present

## 2021-02-11 DIAGNOSIS — Z466 Encounter for fitting and adjustment of urinary device: Secondary | ICD-10-CM | POA: Diagnosis not present

## 2021-02-11 DIAGNOSIS — Z431 Encounter for attention to gastrostomy: Secondary | ICD-10-CM | POA: Diagnosis not present

## 2021-02-11 DIAGNOSIS — Z90710 Acquired absence of both cervix and uterus: Secondary | ICD-10-CM | POA: Diagnosis not present

## 2021-02-11 DIAGNOSIS — I69391 Dysphagia following cerebral infarction: Secondary | ICD-10-CM | POA: Diagnosis not present

## 2021-02-11 DIAGNOSIS — Z7401 Bed confinement status: Secondary | ICD-10-CM | POA: Diagnosis not present

## 2021-02-11 DIAGNOSIS — Z8701 Personal history of pneumonia (recurrent): Secondary | ICD-10-CM | POA: Diagnosis not present

## 2021-02-11 DIAGNOSIS — R011 Cardiac murmur, unspecified: Secondary | ICD-10-CM | POA: Diagnosis not present

## 2021-02-11 DIAGNOSIS — R627 Adult failure to thrive: Secondary | ICD-10-CM | POA: Diagnosis not present

## 2021-02-14 DIAGNOSIS — E871 Hypo-osmolality and hyponatremia: Secondary | ICD-10-CM | POA: Diagnosis not present

## 2021-02-14 DIAGNOSIS — R339 Retention of urine, unspecified: Secondary | ICD-10-CM | POA: Diagnosis not present

## 2021-02-14 DIAGNOSIS — I69311 Memory deficit following cerebral infarction: Secondary | ICD-10-CM | POA: Diagnosis not present

## 2021-02-14 DIAGNOSIS — I69354 Hemiplegia and hemiparesis following cerebral infarction affecting left non-dominant side: Secondary | ICD-10-CM | POA: Diagnosis not present

## 2021-02-14 DIAGNOSIS — Z466 Encounter for fitting and adjustment of urinary device: Secondary | ICD-10-CM | POA: Diagnosis not present

## 2021-02-14 DIAGNOSIS — I69391 Dysphagia following cerebral infarction: Secondary | ICD-10-CM | POA: Diagnosis not present

## 2021-02-20 DIAGNOSIS — I69391 Dysphagia following cerebral infarction: Secondary | ICD-10-CM | POA: Diagnosis not present

## 2021-02-20 DIAGNOSIS — Z466 Encounter for fitting and adjustment of urinary device: Secondary | ICD-10-CM | POA: Diagnosis not present

## 2021-02-20 DIAGNOSIS — R339 Retention of urine, unspecified: Secondary | ICD-10-CM | POA: Diagnosis not present

## 2021-02-20 DIAGNOSIS — E871 Hypo-osmolality and hyponatremia: Secondary | ICD-10-CM | POA: Diagnosis not present

## 2021-02-20 DIAGNOSIS — I69311 Memory deficit following cerebral infarction: Secondary | ICD-10-CM | POA: Diagnosis not present

## 2021-02-20 DIAGNOSIS — I69354 Hemiplegia and hemiparesis following cerebral infarction affecting left non-dominant side: Secondary | ICD-10-CM | POA: Diagnosis not present

## 2021-03-11 DIAGNOSIS — I69354 Hemiplegia and hemiparesis following cerebral infarction affecting left non-dominant side: Secondary | ICD-10-CM | POA: Diagnosis not present

## 2021-03-11 DIAGNOSIS — R339 Retention of urine, unspecified: Secondary | ICD-10-CM | POA: Diagnosis not present

## 2021-03-11 DIAGNOSIS — I69391 Dysphagia following cerebral infarction: Secondary | ICD-10-CM | POA: Diagnosis not present

## 2021-03-11 DIAGNOSIS — I69311 Memory deficit following cerebral infarction: Secondary | ICD-10-CM | POA: Diagnosis not present

## 2021-03-11 DIAGNOSIS — E871 Hypo-osmolality and hyponatremia: Secondary | ICD-10-CM | POA: Diagnosis not present

## 2021-03-11 DIAGNOSIS — Z466 Encounter for fitting and adjustment of urinary device: Secondary | ICD-10-CM | POA: Diagnosis not present

## 2021-03-13 DIAGNOSIS — Z431 Encounter for attention to gastrostomy: Secondary | ICD-10-CM | POA: Diagnosis not present

## 2021-03-13 DIAGNOSIS — D638 Anemia in other chronic diseases classified elsewhere: Secondary | ICD-10-CM | POA: Diagnosis not present

## 2021-03-13 DIAGNOSIS — Z8701 Personal history of pneumonia (recurrent): Secondary | ICD-10-CM | POA: Diagnosis not present

## 2021-03-13 DIAGNOSIS — E871 Hypo-osmolality and hyponatremia: Secondary | ICD-10-CM | POA: Diagnosis not present

## 2021-03-13 DIAGNOSIS — I69311 Memory deficit following cerebral infarction: Secondary | ICD-10-CM | POA: Diagnosis not present

## 2021-03-13 DIAGNOSIS — E7800 Pure hypercholesterolemia, unspecified: Secondary | ICD-10-CM | POA: Diagnosis not present

## 2021-03-13 DIAGNOSIS — M1990 Unspecified osteoarthritis, unspecified site: Secondary | ICD-10-CM | POA: Diagnosis not present

## 2021-03-13 DIAGNOSIS — Z90710 Acquired absence of both cervix and uterus: Secondary | ICD-10-CM | POA: Diagnosis not present

## 2021-03-13 DIAGNOSIS — I69354 Hemiplegia and hemiparesis following cerebral infarction affecting left non-dominant side: Secondary | ICD-10-CM | POA: Diagnosis not present

## 2021-03-13 DIAGNOSIS — I69391 Dysphagia following cerebral infarction: Secondary | ICD-10-CM | POA: Diagnosis not present

## 2021-03-13 DIAGNOSIS — R339 Retention of urine, unspecified: Secondary | ICD-10-CM | POA: Diagnosis not present

## 2021-03-13 DIAGNOSIS — I10 Essential (primary) hypertension: Secondary | ICD-10-CM | POA: Diagnosis not present

## 2021-03-13 DIAGNOSIS — Z9981 Dependence on supplemental oxygen: Secondary | ICD-10-CM | POA: Diagnosis not present

## 2021-03-13 DIAGNOSIS — F0280 Dementia in other diseases classified elsewhere without behavioral disturbance: Secondary | ICD-10-CM | POA: Diagnosis not present

## 2021-03-13 DIAGNOSIS — Z87440 Personal history of urinary (tract) infections: Secondary | ICD-10-CM | POA: Diagnosis not present

## 2021-03-13 DIAGNOSIS — E039 Hypothyroidism, unspecified: Secondary | ICD-10-CM | POA: Diagnosis not present

## 2021-03-13 DIAGNOSIS — Z466 Encounter for fitting and adjustment of urinary device: Secondary | ICD-10-CM | POA: Diagnosis not present

## 2021-03-13 DIAGNOSIS — R011 Cardiac murmur, unspecified: Secondary | ICD-10-CM | POA: Diagnosis not present

## 2021-03-13 DIAGNOSIS — E43 Unspecified severe protein-calorie malnutrition: Secondary | ICD-10-CM | POA: Diagnosis not present

## 2021-03-13 DIAGNOSIS — Z7901 Long term (current) use of anticoagulants: Secondary | ICD-10-CM | POA: Diagnosis not present

## 2021-03-13 DIAGNOSIS — R627 Adult failure to thrive: Secondary | ICD-10-CM | POA: Diagnosis not present

## 2021-03-13 DIAGNOSIS — Z7401 Bed confinement status: Secondary | ICD-10-CM | POA: Diagnosis not present

## 2021-03-13 DIAGNOSIS — Z6828 Body mass index (BMI) 28.0-28.9, adult: Secondary | ICD-10-CM | POA: Diagnosis not present

## 2021-03-13 DIAGNOSIS — Z9181 History of falling: Secondary | ICD-10-CM | POA: Diagnosis not present

## 2021-03-17 DIAGNOSIS — R339 Retention of urine, unspecified: Secondary | ICD-10-CM | POA: Diagnosis not present

## 2021-03-17 DIAGNOSIS — Z466 Encounter for fitting and adjustment of urinary device: Secondary | ICD-10-CM | POA: Diagnosis not present

## 2021-03-17 DIAGNOSIS — E039 Hypothyroidism, unspecified: Secondary | ICD-10-CM | POA: Diagnosis not present

## 2021-03-17 DIAGNOSIS — F0280 Dementia in other diseases classified elsewhere without behavioral disturbance: Secondary | ICD-10-CM | POA: Diagnosis not present

## 2021-03-17 DIAGNOSIS — I69311 Memory deficit following cerebral infarction: Secondary | ICD-10-CM | POA: Diagnosis not present

## 2021-03-17 DIAGNOSIS — I69354 Hemiplegia and hemiparesis following cerebral infarction affecting left non-dominant side: Secondary | ICD-10-CM | POA: Diagnosis not present

## 2021-03-27 DIAGNOSIS — Z466 Encounter for fitting and adjustment of urinary device: Secondary | ICD-10-CM | POA: Diagnosis not present

## 2021-03-27 DIAGNOSIS — R339 Retention of urine, unspecified: Secondary | ICD-10-CM | POA: Diagnosis not present

## 2021-03-27 DIAGNOSIS — E039 Hypothyroidism, unspecified: Secondary | ICD-10-CM | POA: Diagnosis not present

## 2021-03-27 DIAGNOSIS — F0280 Dementia in other diseases classified elsewhere without behavioral disturbance: Secondary | ICD-10-CM | POA: Diagnosis not present

## 2021-03-27 DIAGNOSIS — I69354 Hemiplegia and hemiparesis following cerebral infarction affecting left non-dominant side: Secondary | ICD-10-CM | POA: Diagnosis not present

## 2021-03-27 DIAGNOSIS — I69311 Memory deficit following cerebral infarction: Secondary | ICD-10-CM | POA: Diagnosis not present

## 2021-04-12 DIAGNOSIS — E871 Hypo-osmolality and hyponatremia: Secondary | ICD-10-CM | POA: Diagnosis not present

## 2021-04-12 DIAGNOSIS — M1990 Unspecified osteoarthritis, unspecified site: Secondary | ICD-10-CM | POA: Diagnosis not present

## 2021-04-12 DIAGNOSIS — R011 Cardiac murmur, unspecified: Secondary | ICD-10-CM | POA: Diagnosis not present

## 2021-04-12 DIAGNOSIS — I10 Essential (primary) hypertension: Secondary | ICD-10-CM | POA: Diagnosis not present

## 2021-04-12 DIAGNOSIS — Z7401 Bed confinement status: Secondary | ICD-10-CM | POA: Diagnosis not present

## 2021-04-12 DIAGNOSIS — R627 Adult failure to thrive: Secondary | ICD-10-CM | POA: Diagnosis not present

## 2021-04-12 DIAGNOSIS — F0280 Dementia in other diseases classified elsewhere without behavioral disturbance: Secondary | ICD-10-CM | POA: Diagnosis not present

## 2021-04-12 DIAGNOSIS — Z7901 Long term (current) use of anticoagulants: Secondary | ICD-10-CM | POA: Diagnosis not present

## 2021-04-12 DIAGNOSIS — I69354 Hemiplegia and hemiparesis following cerebral infarction affecting left non-dominant side: Secondary | ICD-10-CM | POA: Diagnosis not present

## 2021-04-12 DIAGNOSIS — Z9181 History of falling: Secondary | ICD-10-CM | POA: Diagnosis not present

## 2021-04-12 DIAGNOSIS — Z9981 Dependence on supplemental oxygen: Secondary | ICD-10-CM | POA: Diagnosis not present

## 2021-04-12 DIAGNOSIS — Z6828 Body mass index (BMI) 28.0-28.9, adult: Secondary | ICD-10-CM | POA: Diagnosis not present

## 2021-04-12 DIAGNOSIS — E7800 Pure hypercholesterolemia, unspecified: Secondary | ICD-10-CM | POA: Diagnosis not present

## 2021-04-12 DIAGNOSIS — R339 Retention of urine, unspecified: Secondary | ICD-10-CM | POA: Diagnosis not present

## 2021-04-12 DIAGNOSIS — Z90710 Acquired absence of both cervix and uterus: Secondary | ICD-10-CM | POA: Diagnosis not present

## 2021-04-12 DIAGNOSIS — E43 Unspecified severe protein-calorie malnutrition: Secondary | ICD-10-CM | POA: Diagnosis not present

## 2021-04-12 DIAGNOSIS — Z8701 Personal history of pneumonia (recurrent): Secondary | ICD-10-CM | POA: Diagnosis not present

## 2021-04-12 DIAGNOSIS — Z466 Encounter for fitting and adjustment of urinary device: Secondary | ICD-10-CM | POA: Diagnosis not present

## 2021-04-12 DIAGNOSIS — Z87440 Personal history of urinary (tract) infections: Secondary | ICD-10-CM | POA: Diagnosis not present

## 2021-04-12 DIAGNOSIS — E039 Hypothyroidism, unspecified: Secondary | ICD-10-CM | POA: Diagnosis not present

## 2021-04-12 DIAGNOSIS — Z431 Encounter for attention to gastrostomy: Secondary | ICD-10-CM | POA: Diagnosis not present

## 2021-04-12 DIAGNOSIS — I69391 Dysphagia following cerebral infarction: Secondary | ICD-10-CM | POA: Diagnosis not present

## 2021-04-12 DIAGNOSIS — I69311 Memory deficit following cerebral infarction: Secondary | ICD-10-CM | POA: Diagnosis not present

## 2021-04-12 DIAGNOSIS — D638 Anemia in other chronic diseases classified elsewhere: Secondary | ICD-10-CM | POA: Diagnosis not present

## 2021-04-19 DIAGNOSIS — E039 Hypothyroidism, unspecified: Secondary | ICD-10-CM | POA: Diagnosis not present

## 2021-04-19 DIAGNOSIS — I69311 Memory deficit following cerebral infarction: Secondary | ICD-10-CM | POA: Diagnosis not present

## 2021-04-19 DIAGNOSIS — I69354 Hemiplegia and hemiparesis following cerebral infarction affecting left non-dominant side: Secondary | ICD-10-CM | POA: Diagnosis not present

## 2021-04-19 DIAGNOSIS — F0280 Dementia in other diseases classified elsewhere without behavioral disturbance: Secondary | ICD-10-CM | POA: Diagnosis not present

## 2021-04-19 DIAGNOSIS — Z466 Encounter for fitting and adjustment of urinary device: Secondary | ICD-10-CM | POA: Diagnosis not present

## 2021-04-19 DIAGNOSIS — R339 Retention of urine, unspecified: Secondary | ICD-10-CM | POA: Diagnosis not present

## 2021-05-08 DIAGNOSIS — E039 Hypothyroidism, unspecified: Secondary | ICD-10-CM | POA: Diagnosis not present

## 2021-05-08 DIAGNOSIS — I69311 Memory deficit following cerebral infarction: Secondary | ICD-10-CM | POA: Diagnosis not present

## 2021-05-08 DIAGNOSIS — F0280 Dementia in other diseases classified elsewhere without behavioral disturbance: Secondary | ICD-10-CM | POA: Diagnosis not present

## 2021-05-08 DIAGNOSIS — I69354 Hemiplegia and hemiparesis following cerebral infarction affecting left non-dominant side: Secondary | ICD-10-CM | POA: Diagnosis not present

## 2021-05-08 DIAGNOSIS — R339 Retention of urine, unspecified: Secondary | ICD-10-CM | POA: Diagnosis not present

## 2021-05-08 DIAGNOSIS — Z466 Encounter for fitting and adjustment of urinary device: Secondary | ICD-10-CM | POA: Diagnosis not present

## 2021-05-12 DIAGNOSIS — M1990 Unspecified osteoarthritis, unspecified site: Secondary | ICD-10-CM | POA: Diagnosis not present

## 2021-05-12 DIAGNOSIS — I10 Essential (primary) hypertension: Secondary | ICD-10-CM | POA: Diagnosis not present

## 2021-05-12 DIAGNOSIS — E039 Hypothyroidism, unspecified: Secondary | ICD-10-CM | POA: Diagnosis not present

## 2021-05-12 DIAGNOSIS — R627 Adult failure to thrive: Secondary | ICD-10-CM | POA: Diagnosis not present

## 2021-05-12 DIAGNOSIS — Z9981 Dependence on supplemental oxygen: Secondary | ICD-10-CM | POA: Diagnosis not present

## 2021-05-12 DIAGNOSIS — I69391 Dysphagia following cerebral infarction: Secondary | ICD-10-CM | POA: Diagnosis not present

## 2021-05-12 DIAGNOSIS — F0280 Dementia in other diseases classified elsewhere without behavioral disturbance: Secondary | ICD-10-CM | POA: Diagnosis not present

## 2021-05-12 DIAGNOSIS — Z931 Gastrostomy status: Secondary | ICD-10-CM | POA: Diagnosis not present

## 2021-05-12 DIAGNOSIS — Z9181 History of falling: Secondary | ICD-10-CM | POA: Diagnosis not present

## 2021-05-12 DIAGNOSIS — I69354 Hemiplegia and hemiparesis following cerebral infarction affecting left non-dominant side: Secondary | ICD-10-CM | POA: Diagnosis not present

## 2021-05-12 DIAGNOSIS — Z7401 Bed confinement status: Secondary | ICD-10-CM | POA: Diagnosis not present

## 2021-05-12 DIAGNOSIS — R339 Retention of urine, unspecified: Secondary | ICD-10-CM | POA: Diagnosis not present

## 2021-05-12 DIAGNOSIS — Z79899 Other long term (current) drug therapy: Secondary | ICD-10-CM | POA: Diagnosis not present

## 2021-05-12 DIAGNOSIS — Z7901 Long term (current) use of anticoagulants: Secondary | ICD-10-CM | POA: Diagnosis not present

## 2021-05-12 DIAGNOSIS — E7800 Pure hypercholesterolemia, unspecified: Secondary | ICD-10-CM | POA: Diagnosis not present

## 2021-05-12 DIAGNOSIS — E871 Hypo-osmolality and hyponatremia: Secondary | ICD-10-CM | POA: Diagnosis not present

## 2021-05-12 DIAGNOSIS — Z466 Encounter for fitting and adjustment of urinary device: Secondary | ICD-10-CM | POA: Diagnosis not present

## 2021-05-12 DIAGNOSIS — E43 Unspecified severe protein-calorie malnutrition: Secondary | ICD-10-CM | POA: Diagnosis not present

## 2021-05-16 DIAGNOSIS — E039 Hypothyroidism, unspecified: Secondary | ICD-10-CM | POA: Diagnosis not present

## 2021-05-16 DIAGNOSIS — Z466 Encounter for fitting and adjustment of urinary device: Secondary | ICD-10-CM | POA: Diagnosis not present

## 2021-05-16 DIAGNOSIS — I69354 Hemiplegia and hemiparesis following cerebral infarction affecting left non-dominant side: Secondary | ICD-10-CM | POA: Diagnosis not present

## 2021-05-16 DIAGNOSIS — F0280 Dementia in other diseases classified elsewhere without behavioral disturbance: Secondary | ICD-10-CM | POA: Diagnosis not present

## 2021-05-16 DIAGNOSIS — R339 Retention of urine, unspecified: Secondary | ICD-10-CM | POA: Diagnosis not present

## 2021-05-16 DIAGNOSIS — I69391 Dysphagia following cerebral infarction: Secondary | ICD-10-CM | POA: Diagnosis not present

## 2021-06-04 DIAGNOSIS — E039 Hypothyroidism, unspecified: Secondary | ICD-10-CM | POA: Diagnosis not present

## 2021-06-04 DIAGNOSIS — Z466 Encounter for fitting and adjustment of urinary device: Secondary | ICD-10-CM | POA: Diagnosis not present

## 2021-06-04 DIAGNOSIS — F0280 Dementia in other diseases classified elsewhere without behavioral disturbance: Secondary | ICD-10-CM | POA: Diagnosis not present

## 2021-06-04 DIAGNOSIS — I69391 Dysphagia following cerebral infarction: Secondary | ICD-10-CM | POA: Diagnosis not present

## 2021-06-04 DIAGNOSIS — I69354 Hemiplegia and hemiparesis following cerebral infarction affecting left non-dominant side: Secondary | ICD-10-CM | POA: Diagnosis not present

## 2021-06-04 DIAGNOSIS — R339 Retention of urine, unspecified: Secondary | ICD-10-CM | POA: Diagnosis not present

## 2021-06-11 DIAGNOSIS — I10 Essential (primary) hypertension: Secondary | ICD-10-CM | POA: Diagnosis not present

## 2021-06-11 DIAGNOSIS — Z466 Encounter for fitting and adjustment of urinary device: Secondary | ICD-10-CM | POA: Diagnosis not present

## 2021-06-11 DIAGNOSIS — Z9181 History of falling: Secondary | ICD-10-CM | POA: Diagnosis not present

## 2021-06-11 DIAGNOSIS — Z9981 Dependence on supplemental oxygen: Secondary | ICD-10-CM | POA: Diagnosis not present

## 2021-06-11 DIAGNOSIS — E871 Hypo-osmolality and hyponatremia: Secondary | ICD-10-CM | POA: Diagnosis not present

## 2021-06-11 DIAGNOSIS — Z931 Gastrostomy status: Secondary | ICD-10-CM | POA: Diagnosis not present

## 2021-06-11 DIAGNOSIS — E43 Unspecified severe protein-calorie malnutrition: Secondary | ICD-10-CM | POA: Diagnosis not present

## 2021-06-11 DIAGNOSIS — M1990 Unspecified osteoarthritis, unspecified site: Secondary | ICD-10-CM | POA: Diagnosis not present

## 2021-06-11 DIAGNOSIS — Z7401 Bed confinement status: Secondary | ICD-10-CM | POA: Diagnosis not present

## 2021-06-11 DIAGNOSIS — R627 Adult failure to thrive: Secondary | ICD-10-CM | POA: Diagnosis not present

## 2021-06-11 DIAGNOSIS — I69391 Dysphagia following cerebral infarction: Secondary | ICD-10-CM | POA: Diagnosis not present

## 2021-06-11 DIAGNOSIS — F0280 Dementia in other diseases classified elsewhere without behavioral disturbance: Secondary | ICD-10-CM | POA: Diagnosis not present

## 2021-06-11 DIAGNOSIS — E039 Hypothyroidism, unspecified: Secondary | ICD-10-CM | POA: Diagnosis not present

## 2021-06-11 DIAGNOSIS — R339 Retention of urine, unspecified: Secondary | ICD-10-CM | POA: Diagnosis not present

## 2021-06-11 DIAGNOSIS — E7800 Pure hypercholesterolemia, unspecified: Secondary | ICD-10-CM | POA: Diagnosis not present

## 2021-06-11 DIAGNOSIS — Z79899 Other long term (current) drug therapy: Secondary | ICD-10-CM | POA: Diagnosis not present

## 2021-06-11 DIAGNOSIS — Z7901 Long term (current) use of anticoagulants: Secondary | ICD-10-CM | POA: Diagnosis not present

## 2021-06-11 DIAGNOSIS — I69354 Hemiplegia and hemiparesis following cerebral infarction affecting left non-dominant side: Secondary | ICD-10-CM | POA: Diagnosis not present

## 2021-06-15 DIAGNOSIS — I69391 Dysphagia following cerebral infarction: Secondary | ICD-10-CM | POA: Diagnosis not present

## 2021-06-15 DIAGNOSIS — I480 Paroxysmal atrial fibrillation: Secondary | ICD-10-CM | POA: Diagnosis not present

## 2021-06-15 DIAGNOSIS — Z79899 Other long term (current) drug therapy: Secondary | ICD-10-CM | POA: Diagnosis not present

## 2021-06-15 DIAGNOSIS — E039 Hypothyroidism, unspecified: Secondary | ICD-10-CM | POA: Diagnosis not present

## 2021-06-15 DIAGNOSIS — Z23 Encounter for immunization: Secondary | ICD-10-CM | POA: Diagnosis not present

## 2021-06-15 DIAGNOSIS — I69354 Hemiplegia and hemiparesis following cerebral infarction affecting left non-dominant side: Secondary | ICD-10-CM | POA: Diagnosis not present

## 2021-06-15 DIAGNOSIS — I10 Essential (primary) hypertension: Secondary | ICD-10-CM | POA: Diagnosis not present

## 2021-06-15 DIAGNOSIS — F419 Anxiety disorder, unspecified: Secondary | ICD-10-CM | POA: Diagnosis not present

## 2021-06-15 DIAGNOSIS — F0280 Dementia in other diseases classified elsewhere without behavioral disturbance: Secondary | ICD-10-CM | POA: Diagnosis not present

## 2021-06-15 DIAGNOSIS — D509 Iron deficiency anemia, unspecified: Secondary | ICD-10-CM | POA: Diagnosis not present

## 2021-06-15 DIAGNOSIS — F32A Depression, unspecified: Secondary | ICD-10-CM | POA: Diagnosis not present

## 2021-06-15 DIAGNOSIS — Z466 Encounter for fitting and adjustment of urinary device: Secondary | ICD-10-CM | POA: Diagnosis not present

## 2021-06-15 DIAGNOSIS — M25561 Pain in right knee: Secondary | ICD-10-CM | POA: Diagnosis not present

## 2021-06-15 DIAGNOSIS — R339 Retention of urine, unspecified: Secondary | ICD-10-CM | POA: Diagnosis not present

## 2021-06-15 DIAGNOSIS — E785 Hyperlipidemia, unspecified: Secondary | ICD-10-CM | POA: Diagnosis not present

## 2021-07-03 DIAGNOSIS — I69391 Dysphagia following cerebral infarction: Secondary | ICD-10-CM | POA: Diagnosis not present

## 2021-07-03 DIAGNOSIS — I69354 Hemiplegia and hemiparesis following cerebral infarction affecting left non-dominant side: Secondary | ICD-10-CM | POA: Diagnosis not present

## 2021-07-03 DIAGNOSIS — E039 Hypothyroidism, unspecified: Secondary | ICD-10-CM | POA: Diagnosis not present

## 2021-07-03 DIAGNOSIS — Z466 Encounter for fitting and adjustment of urinary device: Secondary | ICD-10-CM | POA: Diagnosis not present

## 2021-07-03 DIAGNOSIS — F0280 Dementia in other diseases classified elsewhere without behavioral disturbance: Secondary | ICD-10-CM | POA: Diagnosis not present

## 2021-07-03 DIAGNOSIS — R339 Retention of urine, unspecified: Secondary | ICD-10-CM | POA: Diagnosis not present

## 2021-07-10 DIAGNOSIS — Z466 Encounter for fitting and adjustment of urinary device: Secondary | ICD-10-CM | POA: Diagnosis not present

## 2021-07-10 DIAGNOSIS — R339 Retention of urine, unspecified: Secondary | ICD-10-CM | POA: Diagnosis not present

## 2021-07-10 DIAGNOSIS — I69391 Dysphagia following cerebral infarction: Secondary | ICD-10-CM | POA: Diagnosis not present

## 2021-07-10 DIAGNOSIS — E039 Hypothyroidism, unspecified: Secondary | ICD-10-CM | POA: Diagnosis not present

## 2021-07-10 DIAGNOSIS — F0280 Dementia in other diseases classified elsewhere without behavioral disturbance: Secondary | ICD-10-CM | POA: Diagnosis not present

## 2021-07-10 DIAGNOSIS — I69354 Hemiplegia and hemiparesis following cerebral infarction affecting left non-dominant side: Secondary | ICD-10-CM | POA: Diagnosis not present

## 2021-07-11 DIAGNOSIS — E039 Hypothyroidism, unspecified: Secondary | ICD-10-CM | POA: Diagnosis not present

## 2021-07-11 DIAGNOSIS — M1990 Unspecified osteoarthritis, unspecified site: Secondary | ICD-10-CM | POA: Diagnosis not present

## 2021-07-11 DIAGNOSIS — Z7401 Bed confinement status: Secondary | ICD-10-CM | POA: Diagnosis not present

## 2021-07-11 DIAGNOSIS — Z79899 Other long term (current) drug therapy: Secondary | ICD-10-CM | POA: Diagnosis not present

## 2021-07-11 DIAGNOSIS — I69354 Hemiplegia and hemiparesis following cerebral infarction affecting left non-dominant side: Secondary | ICD-10-CM | POA: Diagnosis not present

## 2021-07-11 DIAGNOSIS — Z9981 Dependence on supplemental oxygen: Secondary | ICD-10-CM | POA: Diagnosis not present

## 2021-07-11 DIAGNOSIS — Z931 Gastrostomy status: Secondary | ICD-10-CM | POA: Diagnosis not present

## 2021-07-11 DIAGNOSIS — E871 Hypo-osmolality and hyponatremia: Secondary | ICD-10-CM | POA: Diagnosis not present

## 2021-07-11 DIAGNOSIS — Z466 Encounter for fitting and adjustment of urinary device: Secondary | ICD-10-CM | POA: Diagnosis not present

## 2021-07-11 DIAGNOSIS — Z9181 History of falling: Secondary | ICD-10-CM | POA: Diagnosis not present

## 2021-07-11 DIAGNOSIS — I69391 Dysphagia following cerebral infarction: Secondary | ICD-10-CM | POA: Diagnosis not present

## 2021-07-11 DIAGNOSIS — R339 Retention of urine, unspecified: Secondary | ICD-10-CM | POA: Diagnosis not present

## 2021-07-11 DIAGNOSIS — F0280 Dementia in other diseases classified elsewhere without behavioral disturbance: Secondary | ICD-10-CM | POA: Diagnosis not present

## 2021-07-11 DIAGNOSIS — I10 Essential (primary) hypertension: Secondary | ICD-10-CM | POA: Diagnosis not present

## 2021-07-11 DIAGNOSIS — E43 Unspecified severe protein-calorie malnutrition: Secondary | ICD-10-CM | POA: Diagnosis not present

## 2021-07-11 DIAGNOSIS — R627 Adult failure to thrive: Secondary | ICD-10-CM | POA: Diagnosis not present

## 2021-07-11 DIAGNOSIS — Z7901 Long term (current) use of anticoagulants: Secondary | ICD-10-CM | POA: Diagnosis not present

## 2021-07-11 DIAGNOSIS — E7800 Pure hypercholesterolemia, unspecified: Secondary | ICD-10-CM | POA: Diagnosis not present

## 2021-07-14 ENCOUNTER — Inpatient Hospital Stay (HOSPITAL_COMMUNITY)
Admission: EM | Admit: 2021-07-14 | Discharge: 2021-07-19 | DRG: 698 | Disposition: A | Discharge status: Home Health | Payer: Medicare Other | Attending: Internal Medicine | Admitting: Internal Medicine

## 2021-07-14 ENCOUNTER — Other Ambulatory Visit: Payer: Self-pay

## 2021-07-14 ENCOUNTER — Emergency Department (HOSPITAL_COMMUNITY): Payer: Medicare Other

## 2021-07-14 ENCOUNTER — Encounter (HOSPITAL_COMMUNITY): Payer: Self-pay | Admitting: Emergency Medicine

## 2021-07-14 DIAGNOSIS — R000 Tachycardia, unspecified: Secondary | ICD-10-CM | POA: Diagnosis not present

## 2021-07-14 DIAGNOSIS — J069 Acute upper respiratory infection, unspecified: Secondary | ICD-10-CM | POA: Diagnosis present

## 2021-07-14 DIAGNOSIS — Z79899 Other long term (current) drug therapy: Secondary | ICD-10-CM | POA: Diagnosis not present

## 2021-07-14 DIAGNOSIS — F0280 Dementia in other diseases classified elsewhere without behavioral disturbance: Secondary | ICD-10-CM | POA: Diagnosis not present

## 2021-07-14 DIAGNOSIS — R059 Cough, unspecified: Secondary | ICD-10-CM | POA: Diagnosis not present

## 2021-07-14 DIAGNOSIS — N3001 Acute cystitis with hematuria: Principal | ICD-10-CM | POA: Diagnosis present

## 2021-07-14 DIAGNOSIS — Z8249 Family history of ischemic heart disease and other diseases of the circulatory system: Secondary | ICD-10-CM

## 2021-07-14 DIAGNOSIS — T83518A Infection and inflammatory reaction due to other urinary catheter, initial encounter: Secondary | ICD-10-CM | POA: Diagnosis present

## 2021-07-14 DIAGNOSIS — R6520 Severe sepsis without septic shock: Secondary | ICD-10-CM | POA: Diagnosis not present

## 2021-07-14 DIAGNOSIS — I10 Essential (primary) hypertension: Secondary | ICD-10-CM | POA: Diagnosis present

## 2021-07-14 DIAGNOSIS — E039 Hypothyroidism, unspecified: Secondary | ICD-10-CM | POA: Diagnosis present

## 2021-07-14 DIAGNOSIS — Z87440 Personal history of urinary (tract) infections: Secondary | ICD-10-CM | POA: Diagnosis not present

## 2021-07-14 DIAGNOSIS — Z931 Gastrostomy status: Secondary | ICD-10-CM

## 2021-07-14 DIAGNOSIS — R1310 Dysphagia, unspecified: Secondary | ICD-10-CM | POA: Diagnosis present

## 2021-07-14 DIAGNOSIS — J9811 Atelectasis: Secondary | ICD-10-CM | POA: Diagnosis not present

## 2021-07-14 DIAGNOSIS — I69391 Dysphagia following cerebral infarction: Secondary | ICD-10-CM

## 2021-07-14 DIAGNOSIS — Y846 Urinary catheterization as the cause of abnormal reaction of the patient, or of later complication, without mention of misadventure at the time of the procedure: Secondary | ICD-10-CM | POA: Diagnosis present

## 2021-07-14 DIAGNOSIS — A419 Sepsis, unspecified organism: Secondary | ICD-10-CM | POA: Diagnosis present

## 2021-07-14 DIAGNOSIS — E7800 Pure hypercholesterolemia, unspecified: Secondary | ICD-10-CM | POA: Diagnosis present

## 2021-07-14 DIAGNOSIS — D649 Anemia, unspecified: Secondary | ICD-10-CM | POA: Diagnosis present

## 2021-07-14 DIAGNOSIS — E43 Unspecified severe protein-calorie malnutrition: Secondary | ICD-10-CM | POA: Diagnosis present

## 2021-07-14 DIAGNOSIS — I69354 Hemiplegia and hemiparesis following cerebral infarction affecting left non-dominant side: Secondary | ICD-10-CM

## 2021-07-14 DIAGNOSIS — J9601 Acute respiratory failure with hypoxia: Secondary | ICD-10-CM | POA: Diagnosis present

## 2021-07-14 DIAGNOSIS — I728 Aneurysm of other specified arteries: Secondary | ICD-10-CM | POA: Diagnosis not present

## 2021-07-14 DIAGNOSIS — Z96653 Presence of artificial knee joint, bilateral: Secondary | ICD-10-CM | POA: Diagnosis present

## 2021-07-14 DIAGNOSIS — R739 Hyperglycemia, unspecified: Secondary | ICD-10-CM | POA: Diagnosis present

## 2021-07-14 DIAGNOSIS — J90 Pleural effusion, not elsewhere classified: Secondary | ICD-10-CM | POA: Diagnosis not present

## 2021-07-14 DIAGNOSIS — R0902 Hypoxemia: Secondary | ICD-10-CM | POA: Diagnosis not present

## 2021-07-14 DIAGNOSIS — Z886 Allergy status to analgesic agent status: Secondary | ICD-10-CM

## 2021-07-14 DIAGNOSIS — J189 Pneumonia, unspecified organism: Secondary | ICD-10-CM | POA: Diagnosis present

## 2021-07-14 DIAGNOSIS — Z466 Encounter for fitting and adjustment of urinary device: Secondary | ICD-10-CM | POA: Diagnosis not present

## 2021-07-14 DIAGNOSIS — Z7401 Bed confinement status: Secondary | ICD-10-CM

## 2021-07-14 DIAGNOSIS — N390 Urinary tract infection, site not specified: Secondary | ICD-10-CM | POA: Diagnosis present

## 2021-07-14 DIAGNOSIS — I4891 Unspecified atrial fibrillation: Secondary | ICD-10-CM | POA: Diagnosis present

## 2021-07-14 DIAGNOSIS — J9600 Acute respiratory failure, unspecified whether with hypoxia or hypercapnia: Secondary | ICD-10-CM | POA: Diagnosis not present

## 2021-07-14 DIAGNOSIS — Z7901 Long term (current) use of anticoagulants: Secondary | ICD-10-CM | POA: Diagnosis not present

## 2021-07-14 DIAGNOSIS — Z90710 Acquired absence of both cervix and uterus: Secondary | ICD-10-CM

## 2021-07-14 DIAGNOSIS — J80 Acute respiratory distress syndrome: Secondary | ICD-10-CM | POA: Diagnosis not present

## 2021-07-14 DIAGNOSIS — R339 Retention of urine, unspecified: Secondary | ICD-10-CM | POA: Diagnosis not present

## 2021-07-14 DIAGNOSIS — Z79890 Hormone replacement therapy (postmenopausal): Secondary | ICD-10-CM | POA: Diagnosis not present

## 2021-07-14 DIAGNOSIS — Z20822 Contact with and (suspected) exposure to covid-19: Secondary | ICD-10-CM | POA: Diagnosis present

## 2021-07-14 DIAGNOSIS — I517 Cardiomegaly: Secondary | ICD-10-CM | POA: Diagnosis not present

## 2021-07-14 DIAGNOSIS — R319 Hematuria, unspecified: Secondary | ICD-10-CM | POA: Diagnosis not present

## 2021-07-14 LAB — CBC WITH DIFFERENTIAL/PLATELET
Abs Immature Granulocytes: 0.05 10*3/uL (ref 0.00–0.07)
Basophils Absolute: 0 10*3/uL (ref 0.0–0.1)
Basophils Relative: 0 %
Eosinophils Absolute: 0.2 10*3/uL (ref 0.0–0.5)
Eosinophils Relative: 1 %
HCT: 32.7 % — ABNORMAL LOW (ref 36.0–46.0)
Hemoglobin: 10.3 g/dL — ABNORMAL LOW (ref 12.0–15.0)
Immature Granulocytes: 0 %
Lymphocytes Relative: 14 %
Lymphs Abs: 2 10*3/uL (ref 0.7–4.0)
MCH: 29 pg (ref 26.0–34.0)
MCHC: 31.5 g/dL (ref 30.0–36.0)
MCV: 92.1 fL (ref 80.0–100.0)
Monocytes Absolute: 1.8 10*3/uL — ABNORMAL HIGH (ref 0.1–1.0)
Monocytes Relative: 12 %
Neutro Abs: 10.2 10*3/uL — ABNORMAL HIGH (ref 1.7–7.7)
Neutrophils Relative %: 73 %
Platelets: 175 10*3/uL (ref 150–400)
RBC: 3.55 MIL/uL — ABNORMAL LOW (ref 3.87–5.11)
RDW: 13.9 % (ref 11.5–15.5)
WBC: 14.1 10*3/uL — ABNORMAL HIGH (ref 4.0–10.5)
nRBC: 0 % (ref 0.0–0.2)

## 2021-07-14 LAB — RESP PANEL BY RT-PCR (FLU A&B, COVID) ARPGX2
Influenza A by PCR: NEGATIVE
Influenza B by PCR: NEGATIVE
SARS Coronavirus 2 by RT PCR: NEGATIVE

## 2021-07-14 LAB — URINALYSIS, ROUTINE W REFLEX MICROSCOPIC
Bilirubin Urine: NEGATIVE
Glucose, UA: NEGATIVE mg/dL
Hgb urine dipstick: NEGATIVE
Ketones, ur: NEGATIVE mg/dL
Nitrite: NEGATIVE
Protein, ur: NEGATIVE mg/dL
Specific Gravity, Urine: 1.021 (ref 1.005–1.030)
WBC, UA: >50 WBC/hpf — ABNORMAL HIGH (ref 0–5)
pH: 5 (ref 5.0–8.0)

## 2021-07-14 LAB — COMPREHENSIVE METABOLIC PANEL
ALT: 18 U/L (ref 0–44)
AST: 17 U/L (ref 15–41)
Albumin: 2.9 g/dL — ABNORMAL LOW (ref 3.5–5.0)
Alkaline Phosphatase: 90 U/L (ref 38–126)
Anion gap: 10 (ref 5–15)
BUN: 86 mg/dL — ABNORMAL HIGH (ref 8–23)
CO2: 22 mmol/L (ref 22–32)
Calcium: 9.1 mg/dL (ref 8.9–10.3)
Chloride: 104 mmol/L (ref 98–111)
Creatinine, Ser: 0.77 mg/dL (ref 0.44–1.00)
GFR, Estimated: >60 mL/min (ref >60)
Glucose, Bld: 249 mg/dL — ABNORMAL HIGH (ref 70–99)
Potassium: 3.9 mmol/L (ref 3.5–5.1)
Sodium: 136 mmol/L (ref 135–145)
Total Bilirubin: 1.3 mg/dL — ABNORMAL HIGH (ref 0.3–1.2)
Total Protein: 6.5 g/dL (ref 6.5–8.1)

## 2021-07-14 LAB — APTT: aPTT: 36 seconds (ref 24–36)

## 2021-07-14 LAB — LACTIC ACID, PLASMA: Lactic Acid, Venous: 1.6 mmol/L (ref 0.5–1.9)

## 2021-07-14 LAB — PROTIME-INR
INR: 1.9 — ABNORMAL HIGH (ref 0.8–1.2)
Prothrombin Time: 22 seconds — ABNORMAL HIGH (ref 11.4–15.2)

## 2021-07-14 MED ORDER — FOLIC ACID 1 MG PO TABS
500.0000 ug | ORAL_TABLET | Freq: Every day | ORAL | Status: DC
  Administered 2021-07-15 – 2021-07-19 (×5): 0.5 mg
  Filled 2021-07-14 (×5): qty 1

## 2021-07-14 MED ORDER — LEVOTHYROXINE SODIUM 50 MCG PO TABS
50.0000 ug | ORAL_TABLET | Freq: Every day | ORAL | Status: DC
  Administered 2021-07-15 – 2021-07-19 (×5): 50 ug
  Filled 2021-07-14 (×5): qty 1

## 2021-07-14 MED ORDER — POLYETHYLENE GLYCOL 3350 17 G PO PACK: 17.0000 g | PACK | Freq: Every day | ORAL | Status: DC | PRN

## 2021-07-14 MED ORDER — ACETAMINOPHEN 650 MG RE SUPP: 650.0000 mg | Freq: Four times a day (QID) | RECTAL | Status: DC | PRN

## 2021-07-14 MED ORDER — INSULIN ASPART 100 UNIT/ML IJ SOLN
0.0000 [IU] | Freq: Three times a day (TID) | INTRAMUSCULAR | Status: DC
  Administered 2021-07-15: 5 [IU] via SUBCUTANEOUS
  Administered 2021-07-15 (×2): 3 [IU] via SUBCUTANEOUS
  Administered 2021-07-16: 8 [IU] via SUBCUTANEOUS

## 2021-07-14 MED ORDER — SODIUM CHLORIDE 0.9 % IV SOLN
2.0000 g | Freq: Once | INTRAVENOUS | Status: AC
  Administered 2021-07-15: 2 g via INTRAVENOUS
  Filled 2021-07-14: qty 2

## 2021-07-14 MED ORDER — LACTATED RINGERS IV BOLUS
1000.0000 mL | Freq: Once | INTRAVENOUS | Status: AC
  Administered 2021-07-14: 1000 mL via INTRAVENOUS

## 2021-07-14 MED ORDER — LACTATED RINGERS IV BOLUS
850.0000 mL | Freq: Once | INTRAVENOUS | Status: AC
  Administered 2021-07-14: 850 mL via INTRAVENOUS

## 2021-07-14 MED ORDER — APIXABAN 5 MG PO TABS
5.0000 mg | ORAL_TABLET | Freq: Two times a day (BID) | ORAL | Status: DC
  Administered 2021-07-15 – 2021-07-19 (×10): 5 mg
  Filled 2021-07-14 (×10): qty 1

## 2021-07-14 MED ORDER — ACETAMINOPHEN 325 MG PO TABS
650.0000 mg | ORAL_TABLET | Freq: Four times a day (QID) | ORAL | Status: DC | PRN
  Administered 2021-07-14: 650 mg via ORAL
  Filled 2021-07-14: qty 2

## 2021-07-14 MED ORDER — VITAMIN D 25 MCG (1000 UNIT) PO TABS
2000.0000 [IU] | ORAL_TABLET | Freq: Every day | ORAL | Status: DC
  Administered 2021-07-15: 2000 [IU] via ORAL
  Filled 2021-07-14: qty 2

## 2021-07-14 MED ORDER — SODIUM CHLORIDE 0.9 % IV SOLN
1.0000 g | Freq: Once | INTRAVENOUS | Status: AC
  Administered 2021-07-14: 1 g via INTRAVENOUS
  Filled 2021-07-14: qty 10

## 2021-07-14 MED ORDER — FREE WATER
200.0000 mL | Freq: Four times a day (QID) | Status: DC
  Administered 2021-07-15 – 2021-07-19 (×18): 200 mL

## 2021-07-14 MED ORDER — BETHANECHOL CHLORIDE 25 MG PO TABS
50.0000 mg | ORAL_TABLET | Freq: Two times a day (BID) | ORAL | Status: DC
  Filled 2021-07-14: qty 2

## 2021-07-14 MED ORDER — VITAMIN B-12 1000 MCG PO TABS
1000.0000 ug | ORAL_TABLET | Freq: Every day | ORAL | Status: DC
  Administered 2021-07-15 – 2021-07-19 (×5): 1000 ug
  Filled 2021-07-14 (×5): qty 1

## 2021-07-14 MED ORDER — PROSOURCE TF PO LIQD
45.0000 mL | Freq: Every day | ORAL | Status: DC
  Administered 2021-07-15 – 2021-07-19 (×5): 45 mL
  Filled 2021-07-14 (×5): qty 45

## 2021-07-14 MED ORDER — ACETAMINOPHEN 325 MG PO TABS
650.0000 mg | ORAL_TABLET | Freq: Four times a day (QID) | ORAL | Status: DC | PRN
  Administered 2021-07-15 – 2021-07-17 (×4): 650 mg
  Filled 2021-07-14 (×3): qty 2

## 2021-07-14 MED ORDER — LACTATED RINGERS IV SOLN
INTRAVENOUS | Status: AC
Start: 2021-07-14 — End: 2021-07-15

## 2021-07-14 MED ORDER — GUAIFENESIN ER 600 MG PO TB12
600.0000 mg | ORAL_TABLET | Freq: Two times a day (BID) | ORAL | Status: DC
  Administered 2021-07-15 (×2): 600 mg via ORAL
  Filled 2021-07-14 (×2): qty 1

## 2021-07-14 MED ORDER — ADULT MULTIVITAMIN W/MINERALS CH
1.0000 | ORAL_TABLET | Freq: Every day | ORAL | Status: DC
  Administered 2021-07-15 – 2021-07-19 (×5): 1
  Filled 2021-07-14 (×5): qty 1

## 2021-07-14 MED ORDER — SODIUM CHLORIDE 0.9 % IV SOLN
2.0000 g | Freq: Two times a day (BID) | INTRAVENOUS | Status: AC
  Administered 2021-07-15 – 2021-07-17 (×5): 2 g via INTRAVENOUS
  Filled 2021-07-14 (×5): qty 2

## 2021-07-14 NOTE — ED Notes (Signed)
Labs collected at 1509, charted 1309 in error by this RN.

## 2021-07-14 NOTE — Progress Notes (Signed)
Pharmacy Antibiotic Note  [name] is a 87 y.o. female admitted on 07/14/2021 with UTI.  Pharmacy has been consulted for cefepime dosing.  Patient with a history of HTN, HLD, prior CVA with residual left sided weakness, a fib on Eliquis, chronic anemia, chronic dysphagia with G-tube on tube feeds, urinary retention with chronic foley catheter and hypothyroidism.  Patient with history of pseudomonas in urine.  SCr 0.77 - above baseline WBC 14.1; LA 1.6; T 101.4 F  Plan: Cefepime 2g q12h unless change in renal function Trend WBC, Fever, Renal function, & Clinical course F/u cultures Levels at steady state De-escalate when able     Temp (24hrs), Avg:100.3 F (37.9 C), Min:99.2 F (37.3 C), Max:101.4 F (38.6 C)  Recent Labs  Lab 07/14/21 1509 07/14/21 1522  WBC 14.1*  --   CREATININE 0.77  --   LATICACIDVEN  --  1.6    CrCl cannot be calculated (Unknown ideal weight.).    Allergies  Allergen Reactions   Aspirin Other (See Comments)    Causes blood in urine    Antimicrobials this admission: ceftriaxone 11/4 x 1 in ED cefepime 11/4 >>  Microbiology results: Pending  Thank you for allowing pharmacy to be a part of this patient's care.  Lorelei Pont, PharmD, BCPS 07/14/2021 9:46 PM ED Clinical Pharmacist -  [phone]

## 2021-07-14 NOTE — ED Triage Notes (Signed)
Pt BIB [facility] from home, lives with family. Family endorses chest congestion, mild cough, and fever. Pt given 1g tylenol by family. Wears 3L at baseline, placed on 4L O2. Pt took 2 covid tests at home, both negative. Pt confused at baseline. A/o x2 at this time. EMS VS- 98.2 oral, 110/60, HR 110

## 2021-07-14 NOTE — ED Provider Notes (Signed)
[facility] EMERGENCY DEPARTMENT Provider Note   CSN: [id number] Arrival date & time: 07/14/21  1445     History Chief Complaint  Patient presents with   Cough   Fever    [name] is a 87 y.o. female.  HPI  Patient is an 87 year old female with a history of hyperlipidemia, hypertension, hypothyroidism, CVA, who presents to the emergency department due to worsening cough and congestion.  Per EMS notes, patient lives with her family and her family endorsed chest congestion with mild cough and fever.  She was given 1 g of Tylenol by her family.  She wears 3 L of O2 via nasal cannula at baseline was placed on 4 L of O2 via nasal cannula.  Patient was given 2 home COVID test which were both negative.  They state patient is confused at baseline and is A&O x2 at this time.  Patient speaking and whispers.  Difficult to understand.  Endorses cough but otherwise denies any complaints.  Appears extremely fatigued.  Rhonchorous breath sounds.  Tachycardic.  Patient's son is now at bedside.  States that she has been having a productive cough for the past few days.  States she has more fatigued and more altered than normal.  Level 5 caveat due to acuity of condition     Past Medical History:  Diagnosis Date   Anemia    Arthritis    "was in my knees"   High cholesterol    History of blood transfusion    "S/P knee OR"   Hypertension    Hypothyroidism    Pneumonia    "maybe in my teens"   Recurrent UTI (urinary tract infection)    Stroke ([place]) 1990's   "mild", denies residual on 01/28/2014    Patient Active Problem List   Diagnosis Date Noted   Hypotension    Hyperkalemia    Pressure injury of skin 11/04/2020   Protein-calorie malnutrition, severe 11/04/2020   Hyponatremia 11/03/2020   Acute ischemic right MCA stroke ([place]) 01/23/2016   Gait disturbance, post-stroke    Hemiparesis ([place])    Essential hypertension    Dyslipidemia    Thyroid activity  decreased    E-coli UTI    Dyspepsia    Dehydration    Acute blood loss anemia    Intracranial vascular stenosis    Ischemic stroke ([place]) 01/19/2016   Recurrent UTI 01/19/2016   HLD (hyperlipidemia) 01/19/2016   Intertrochanteric fracture of right hip ([place]) 03/23/2014   HTN (hypertension) 03/23/2014   Hypothyroidism 03/23/2014   Anemia 03/23/2014   Thrombocytopenia, unspecified ([place]) 03/23/2014   Closed right hip fracture ([place]) 03/23/2014   Pseudoaneurysm of right femoral artery ([place]) 01/31/2014   Pseudoaneurysm of femoral artery ([place]) 01/30/2014   Splenic artery aneurysm ([place]) 12/04/2013    Past Surgical History:  Procedure Laterality Date   BLADDER SUSPENSION  2012   EMBOLIZATION N/A 01/28/2014   Procedure: EMBOLIZATION;  Surgeon: Conrad [place], MD;  Location: [facility] CATH LAB;  Service: Cardiovascular;  Laterality: N/A;  Splenic Artery   FEMUR IM NAIL Right 03/23/2014   Procedure: INTRAMEDULLARY (IM) NAIL FEMORAL;  Surgeon: Mauri Pole, MD;  Location: [facility] ORS;  Service: Orthopedics;  Laterality: Right;   IR GJ TUBE CHANGE  11/09/2020   JOINT REPLACEMENT     MUSCLE BIOPSY Left 1990's   "knot biopsy from carrying mail bag for years"   REVISON OF ARTERIOVENOUS FISTULA Right 01/31/2014   Procedure: REPAIR PSEUDOANEURYSM;  Surgeon: Butch Penny  Trula Slade, MD;  Location: [facility] OR;  Service: Vascular;  Laterality: Right;  Repair of Femoral Pseudoaneurysm.   SPLENIC ARTERY EMBOLIZATION  01/28/2014   TOTAL KNEE ARTHROPLASTY Bilateral 2012   VAGINAL HYSTERECTOMY     VISCERAL ANGIOGRAM N/A 01/28/2014   Procedure: VISCERAL ANGIOGRAM;  Surgeon: Conrad [place], MD;  Location: [facility] CATH LAB;  Service: Cardiovascular;  Laterality: N/A;     OB History   No obstetric history on file.     Family History  Problem Relation Age of Onset   Hypertension Son    Heart attack Neg Hx     Social History   Tobacco Use   Smoking status: Never   Smokeless tobacco: Never  Substance Use Topics   Alcohol use: No     Alcohol/week: 0.0 standard drinks   Drug use: No    Home Medications Prior to Admission medications   Medication Sig Start Date End Date Taking? Authorizing Provider  acetaminophen (TYLENOL) 500 MG tablet Place 1 tablet (500 mg total) into feeding tube every 6 (six) hours as needed for mild pain, moderate pain or headache. 11/12/20   Domenic Polite, MD  amLODipine (NORVASC) 5 MG tablet Place 1 tablet (5 mg total) into feeding tube in the morning and at bedtime. 11/12/20   Domenic Polite, MD  apixaban (ELIQUIS) 5 MG TABS tablet Place 1 tablet (5 mg total) into feeding tube 2 (two) times daily. 11/12/20   Domenic Polite, MD  bethanechol (URECHOLINE) 50 MG tablet Place 1 tablet (50 mg total) into feeding tube 2 (two) times daily. 11/12/20   Domenic Polite, MD  carvedilol (COREG) 6.25 MG tablet Place 1 tablet (6.25 mg total) into feeding tube 2 (two) times daily with a meal. 11/12/20   Domenic Polite, MD  cholecalciferol (VITAMIN D) 1000 UNITS tablet Take 2,000 Units by mouth daily.    [provider]  clonazePAM (KLONOPIN) 0.5 MG tablet Place 0.5 tablets (0.25 mg total) into feeding tube in the morning and at bedtime. 11/12/20   Domenic Polite, MD  doxazosin (CARDURA) 4 MG tablet Place 1 tablet (4 mg total) into feeding tube daily. 11/12/20   Domenic Polite, MD  folic acid (FOLVITE) 782 MCG tablet Place 0.5 tablets (400 mcg total) into feeding tube daily. 11/12/20   Domenic Polite, MD  furosemide (LASIX) 20 MG tablet Place 1 tablet (20 mg total) into feeding tube daily. 11/13/20   Domenic Polite, MD  hydrALAZINE (APRESOLINE) 10 MG tablet Place 1 tablet (10 mg total) into feeding tube 3 (three) times daily. 11/12/20   Domenic Polite, MD  levothyroxine (SYNTHROID) 50 MCG tablet Place 1 tablet (50 mcg total) into feeding tube daily before breakfast. 11/12/20   Domenic Polite, MD  Multiple Minerals-Vitamins (CITRACAL MAXIMUM PLUS PO) Take 1 tablet by mouth in the morning and at bedtime.    [provider]  Nutritional Supplements (FEEDING SUPPLEMENT, PROSOURCE TF,) liquid Place 45 mLs into feeding tube daily. 11/13/20   Domenic Polite, MD  OVER THE COUNTER MEDICATION Take 1 tablet by mouth daily. Medication: Prunelax Maxium laxative    [provider]  QUEtiapine (SEROQUEL) 25 MG tablet Place 0.5 tablets (12.5 mg total) into feeding tube at bedtime. 11/12/20   Domenic Polite, MD  vitamin B-12 (CYANOCOBALAMIN) 1000 MCG tablet Take 1,000 mcg by mouth daily.    [provider]  vitamin C (ASCORBIC ACID) 500 MG tablet Take 500 mg by mouth daily.    [provider]  Water For Irrigation, Sterile (FREE  WATER) SOLN Place 200 mLs into feeding tube every 6 (six) hours. 11/12/20   Domenic Polite, MD  zinc gluconate 50 MG tablet Take 50 mg by mouth daily.    [provider]    Allergies    Aspirin  Review of Systems   Review of Systems  All other systems reviewed and are negative. Ten systems reviewed and are negative for acute change, except as noted in the HPI.   Physical Exam Updated Vital Signs BP (!) 98/59   Pulse 97   Temp 99.2 F (37.3 C) (Oral)   Resp (!) 32   SpO2 97%   Physical Exam Vitals and nursing note reviewed.  Constitutional:      General: She is not in acute distress.    Appearance: Normal appearance. She is normal weight. She is not ill-appearing, toxic-appearing or diaphoretic.  HENT:     Head: Normocephalic and atraumatic.     Right Ear: External ear normal.     Left Ear: External ear normal.     Nose: Nose normal.     Mouth/Throat:     Mouth: Mucous membranes are moist.     Pharynx: Oropharynx is clear. No oropharyngeal exudate or posterior oropharyngeal erythema.  Eyes:     General: No scleral icterus.       Right eye: No discharge.        Left eye: No discharge.     Extraocular Movements: Extraocular movements intact.     Conjunctiva/sclera: Conjunctivae normal.  Cardiovascular:     Rate and Rhythm: Regular  rhythm. Tachycardia present.     Pulses: Normal pulses.     Heart sounds: Normal heart sounds. No murmur heard.   No friction rub. No gallop.  Pulmonary:     Effort: Respiratory distress present.     Breath sounds: No stridor. Rhonchi present. No wheezing or rales.  Abdominal:     General: Abdomen is flat.     Palpations: Abdomen is soft.     Tenderness: There is no abdominal tenderness.  Musculoskeletal:        General: Normal range of motion.     Cervical back: Normal range of motion and neck supple. No tenderness.     Right lower leg: No edema.     Left lower leg: No edema.  Skin:    General: Skin is warm and dry.  Neurological:     General: No focal deficit present.     Mental Status: She is alert.  Psychiatric:        Mood and Affect: Mood normal.        Behavior: Behavior normal.   ED Results / Procedures / Treatments   Labs (all labs ordered are listed, but only abnormal results are displayed) Labs Reviewed  COMPREHENSIVE METABOLIC PANEL - Abnormal; Notable for the following components:      Result Value   Glucose, Bld 249 (*)    BUN 86 (*)    Albumin 2.9 (*)    Total Bilirubin 1.3 (*)    All other components within normal limits  CBC WITH DIFFERENTIAL/PLATELET - Abnormal; Notable for the following components:   WBC 14.1 (*)    RBC 3.55 (*)    Hemoglobin 10.3 (*)    HCT 32.7 (*)    Neutro Abs 10.2 (*)    Monocytes Absolute 1.8 (*)    All other components within normal limits  PROTIME-INR - Abnormal; Notable for the following components:   Prothrombin Time 22.0 (*)  INR 1.9 (*)    All other components within normal limits  URINALYSIS, ROUTINE W REFLEX MICROSCOPIC - Abnormal; Notable for the following components:   APPearance CLOUDY (*)    Leukocytes,Ua LARGE (*)    WBC, UA >50 (*)    Bacteria, UA RARE (*)    Non Squamous Epithelial 0-5 (*)    All other components within normal limits  CULTURE, BLOOD (ROUTINE X 2)  RESP PANEL BY RT-PCR (FLU A&B, COVID)  ARPGX2  CULTURE, BLOOD (ROUTINE X 2)  URINE CULTURE  LACTIC ACID, PLASMA  APTT   EKG EKG Interpretation  Date/Time:  Friday July 14 2021 14:55:33 EDT Ventricular Rate:  110 PR Interval:  189 QRS Duration: 102 QT Interval:  330 QTC Calculation: 447 R Axis:   -15 Text Interpretation: Sinus tachycardia Borderline left axis deviation Probable anteroseptal infarct, recent Confirmed by Lavenia Atlas [phone]) on 07/14/2021 6:54:23 PM  Radiology DG Chest Port 1 View  Result Date: 07/14/2021 CLINICAL DATA:  Sepsis EXAM: PORTABLE CHEST 1 VIEW COMPARISON:  Chest x-ray 11/08/2020 FINDINGS: Heart is mildly enlarged. Tortuous thoracic aorta with calcified plaques in the arch. Mediastinum appears stable. Mildly prominent interstitial lung markings and biapical pleural thickening similar to previous. No focal consolidation visualized. No significant pleural effusion. No pneumothorax identified. IMPRESSION: Chronic pain changes as described with no focal consolidation identified. Electronically Signed   By: Ofilia Neas M.D.   On: 07/14/2021 15:52    Procedures .Critical Care Performed by: Rayna Sexton, PA-C Authorized by: Rayna Sexton, PA-C   Critical care provider statement:    Critical care time (minutes):  45   Critical care was necessary to treat or prevent imminent or life-threatening deterioration of the following conditions:  Sepsis   Critical care was time spent personally by me on the following activities:  Development of treatment plan with patient or surrogate, evaluation of patient's response to treatment, examination of patient, ordering and performing treatments and interventions, ordering and review of laboratory studies, ordering and review of radiographic studies and re-evaluation of patient's condition   Medications Ordered in ED Medications  acetaminophen (TYLENOL) tablet 650 mg (650 mg Oral Given 07/14/21 1712)  cefTRIAXone (ROCEPHIN) 1 g in sodium chloride 0.9  % 100 mL IVPB (has no administration in time range)  lactated ringers bolus 850 mL (has no administration in time range)  lactated ringers bolus 1,000 mL (1,000 mLs Intravenous New Bag/Given 07/14/21 1711)   ED Course  I have reviewed the triage vital signs and the nursing notes.  Pertinent labs & imaging results that were available during my care of the patient were reviewed by me and considered in my medical decision making (see chart for details).  Clinical Course as of 07/14/21 2006  Fri Jul 14, 2021  1949 UA has resulted.  Shows large leukocytes, greater than 50 white blood cells, rare bacteria.  Culture obtained.  Appears to be the likely source of the patient's infection.  We will give an additional 850 cc of IV fluids for a 30 cc/kg bolus based on ideal body weight.  We will start on Rocephin.  Will admit to medicine. [LJ]    Clinical Course User Index [LJ] Rayna Sexton, PA-C   MDM Rules/Calculators/A&P                          Pt is a 87 y.o. female who presents to the emergency department with what appears to be a likely urosepsis.  Labs:  CBC with a white count of 14.1, hemoglobin of 10.3, neutrophils of 10.2, monocytes of 1.8. CMP with a glucose of 249, BUN of 86, albumin of 2.9, total bilirubin of 1.3. Prothrombin time of 22 with an INR of 1.9. UA with large leukocytes, greater than 50 white blood cells, rare bacteria. Lactic acid 1.6. Blood cultures obtained.  Imaging: Chest x-ray shows chronic pain changes as described with no focal consolidation identified.  I, Rayna Sexton, PA-C, personally reviewed and evaluated these images and lab results as part of my medical decision-making.  Patient presented febrile as well as tachycardic.  Her son is at bedside and feels that she has been having a more productive cough for the past 2 to 3 days.  He states that she was given oxygen after a prior admission but she actually does not wear oxygen regularly.  She was placed  on oxygen 2 to 3 days ago by her home health nurse because she was found to be desaturating.  Patient on 4 L currently via nasal cannula with no evidence of hypoxia since arrival.  Chest x-ray shows no focal consolidation.  Respiratory panel is negative.  CBC with leukocytosis.  UA concerning for acute infection.  We will give additional IV fluids and treat with Rocephin.  Given patient's age, presentation, as well as significant comorbidities, feel that she will require admission for further management.  Will discuss with the medicine team.  Note: Portions of this report may have been transcribed using voice recognition software. Every effort was made to ensure accuracy; however, inadvertent computerized transcription errors may be present.   Final Clinical Impression(s) / ED Diagnoses Final diagnoses:  Acute cystitis with hematuria  Sepsis, due to unspecified organism, unspecified whether acute organ dysfunction present [facility])   Rx / DC Orders ED Discharge Orders     None        Rayna Sexton, PA-C 07/14/21 2012    Lorelle Gibbs, DO 07/15/21 1844

## 2021-07-14 NOTE — H&P (Signed)
Date: 07/14/2021               Patient Name:  [name] MRN: [id number]  DOB: 1934/09/07 Age / Sex: 87 y.o., female   PCP: Paulina Fusi, MD         Medical Service: Internal Medicine Teaching Service         Attending Physician: Dr. Earl Lagos, MD    First Contact: Dr. Park Pope Pager: [phone]  Second Contact: Dr. Doran Stabler Pager: [phone]       After Hours (After 5p/  First Contact Pager: [phone]  weekends / holidays): Second Contact Pager: [phone]   Chief Complaint: Cough  History of Present Illness: [name] is an 30 yoF with a PMHx of HTN, HLD, prior CVA with residual left sided weakness, a fib on Eliquis, chronic anemia, chronic dysphagia with G-tube on tube feeds, urinary retention with chronic foley catheter and hypothyroidism who presented to [facility] ED with worsening cough and congestion for the last several days.  Most of the history provided by patient's son who is at bedside.  Son reports patient started having more upper respiratory secretions on Monday, with cough, lethargic.  Today patient's oxygen level dropped to low 90s with shallow breathing, and home aide restarted patient on home oxygen that was provided after admission in February. Unclear why patient was started on oxygen at last admission, home oxygen order in on dc summary though no documentation of why it was ordered . Prior to today patient had not been using the home oxygen. Son reports patient tested negative for COVID in the ED.  Patient and son denying abdominal pain, dysuria, foul odor to urine.  Urine appears more cloudy. She has a chronic indwelling Foley in place since 2021, last exchanged October 24th.  Patient has Foley exchanged every month.  She has a history of urinary retention for which she takes Bethanechol and son reports frequent UTIs.  Urine cultures previously grew Pseudomonas and E. coli. Unclear who is managing chronic indwelling foley and G tube. G tube last  exchanged Feb 2022. Son reports patient sees PCP though documentation not in our system.  Patient denies chest pain, abdominal pain. Is endorsing new shoulder pain which son reports is new complaint for her. Has not had any recent falls. Is bedbound. Has residual LHB weakness from prior CVA.  ED Course: Patient febrile to 101.4, and tachycardic to 111, with RR 30s satting upper 90s on 4L [place].  Blood pressure soft 90s-100s systolic on arrival. CBC with leukocytosis 14.1, normocytic anemia with hemoglobin 10.3 (baseline 8-11).  BMP notable for mild hyperglycemia 249, albumin low 2.9.  Infectious workup notable for UA with large leukocytes, WBC >50, rare bacteria.  COVID-negative, flu negative.  ED gave patient dose of Rocephin and 1850cc fluid bolus for presumed urosepsis.  Lactic acid normal 1.6.  Medicine contacted for admission.  Meds:  Current Outpatient Medications  Medication Instructions   acetaminophen (TYLENOL) 500 mg, Per Tube, Every 6 hours PRN   amLODipine (NORVASC) 5 mg, Per Tube, 2 times daily   apixaban (ELIQUIS) 5 mg, Per Tube, 2 times daily   bethanechol (URECHOLINE) 50 mg, Per Tube, 2 times daily   carvedilol (COREG) 6.25 mg, Per Tube, 2 times daily with meals   cholecalciferol (VITAMIN D) 1,000 Units, Oral, Daily   clonazePAM (KLONOPIN) 0.25 mg, Per Tube, 2 times daily   doxazosin (CARDURA) 4 mg, Per Tube, Daily   folic acid (FOLVITE) 400  mcg, Per Tube, Daily   furosemide (LASIX) 20 mg, Per Tube, Daily   hydrALAZINE (APRESOLINE) 10 mg, Per Tube, 3 times daily   iron polysaccharides (NIFEREX) 150 mg, Oral, Daily   levothyroxine (SYNTHROID) 50 mcg, Per Tube, Daily before breakfast   Multiple Minerals-Vitamins (CITRACAL MAXIMUM PLUS PO) 2 tablets, Oral, Daily   Nutritional Supplements (FEEDING SUPPLEMENT, PROSOURCE TF,) liquid 45 mLs, Per Tube, Daily   OVER THE COUNTER MEDICATION 1 tablet, Oral, Daily PRN, Medication: Prunelax Maxium laxative   QUEtiapine (SEROQUEL) 12.5 mg, Per  Tube, Daily at bedtime   vitamin B-12 (CYANOCOBALAMIN) 1,000 mcg, Oral, Daily   vitamin C (ASCORBIC ACID) 500 mg, Oral, Daily   Vitamins C E (CRANBERRY URINARY COMFORT) 100-3 MG-UNIT CAPS 2,000 Units, Oral, Daily   Water For Irrigation, Sterile (FREE WATER) SOLN 200 mLs, Per Tube, Every 6 hours   zinc gluconate 50 mg, Oral, Daily    Allergies: Allergies as of 07/14/2021 - Review Complete 07/14/2021  Allergen Reaction Noted   Aspirin Other (See Comments) 12/04/2013   Past Medical History:  Diagnosis Date   Anemia    Arthritis    "was in my knees"   High cholesterol    History of blood transfusion    "S/P knee OR"   Hypertension    Hypothyroidism    Pneumonia    "maybe in my teens"   Recurrent UTI (urinary tract infection)    Stroke [facility]) 1990's   "mild", denies residual on 01/28/2014    Family History:  Family History  Problem Relation Age of Onset   Hypertension Son    Heart attack Neg Hx      Social History: Patient lives with son.  Previously employed as post Film/video editor.  No history of alcohol, tobacco or illicit drug use.  Currently bedbound.  Requiring help with all ADLs. Son helps manage medications.    Review of Systems: A complete ROS was negative except as per HPI.   Physical Exam: Blood pressure (!) 125/57, pulse 96, temperature 99.2 F (37.3 C), temperature source Oral, resp. rate (!) 24, SpO2 96 %. Physical Exam: General: Lethargic elderly female, NAD HENT: normocephalic, atraumatic, MMM EYES: conjunctiva non-erythematous, no scleral icterus CV: regular rate, irregular rhythm, no murmurs, rubs, gallops.Trace lower extremity edema. Pulmonary: normal work of breathing on 4L [place], diminished breath sounds right lower lobe, crackles left lower lobe, intermittent rhonchi, transmitted upper airway sounds. Abdominal: non-distended, soft, mild discomfort to palpation lower abdomen, normal BS Skin: Warm and dry, no rashes or lesions Neurological: MS: awake,  lethargic, roused to voice, normal speech  Motor: moves all extremities antigravity Tone: Muscle atrophy bilateral lower extremities and left hemibody. Psych: normal affect  CBC    Component Value Date/Time   WBC 14.1 (H) 07/14/2021 1509   RBC 3.55 (L) 07/14/2021 1509   HGB 10.3 (L) 07/14/2021 1509   HCT 32.7 (L) 07/14/2021 1509   PLT 175 07/14/2021 1509   MCV 92.1 07/14/2021 1509   MCH 29.0 07/14/2021 1509   MCHC 31.5 07/14/2021 1509   RDW 13.9 07/14/2021 1509   LYMPHSABS 2.0 07/14/2021 1509   MONOABS 1.8 (H) 07/14/2021 1509   EOSABS 0.2 07/14/2021 1509   BASOSABS 0.0 07/14/2021 1509   CMP     Component Value Date/Time   NA 136 07/14/2021 1509   K 3.9 07/14/2021 1509   CL 104 07/14/2021 1509   CO2 22 07/14/2021 1509   GLUCOSE 249 (H) 07/14/2021 1509   BUN 86 (H) 07/14/2021 1509  CREATININE 0.77 07/14/2021 1509   CALCIUM 9.1 07/14/2021 1509   PROT 6.5 07/14/2021 1509   ALBUMIN 2.9 (L) 07/14/2021 1509   AST 17 07/14/2021 1509   ALT 18 07/14/2021 1509   ALKPHOS 90 07/14/2021 1509   BILITOT 1.3 (H) 07/14/2021 1509   GFRNONAA >60 07/14/2021 1509   GFRAA >60 01/30/2016 0601    EKG: personally reviewed my interpretation is sinus tachycardia, the telemetry at bedside showing A. Fib HR 90s.  CXR: personally reviewed my interpretation is tortuous thoracic aorta, mildly prominent interstitial lung markings and biapical pleural thickening.  No focal consolidation.  No significant pleural effusion.  Assessment & Plan by Problem: Principal Problem:   Sepsis (HCC) Active Problems:   Recurrent UTI   Protein-calorie malnutrition, severe   Acute hypoxemic respiratory failure (HCC)  History of Present Illness: Ms. Samarrah Vanhyning is an 11 yoF with a PMHx of HTN, HLD, prior CVA with residual left sided weakness, a fib on Eliquis, chronic anemia, chronic dysphagia with G-tube on tube feeds, urinary retention with chronic foley catheter and hypothyroidism who presented to [facility] ED  with cough and congestion and new O2 requirement, and found to have sepsis secondary to CAUTI with possible concurrent viral upper respiratory infection.  #Sepsis 2/2 CAUTI #Chronic indwelling Foley 2/2 recurrent UTIs and urinary retention Fulfills 4/4 SIRS criteria with likely source identified.  Febrile 101.4, tacky 111, leukocytosis 14, RR 30s and UA suggestive of UTI in patient with chronic indwelling Foley.  Lactic acid normal.  Foley last exchanged 10/24.  Patient currently does not follow with urology.  Takes bethanechol for urinary retention.  In the ED, patient received 1850 cc fluid bolus and dose of Rocephin. Plan: -Foley catheter exchange for source control -Urine culture pending -Blood culture pending -Broaden ABX to cefepime given previous history of Pseudomonas UTI  -Plan to narrow ABX following culture results and susceptibilities -LR maintenance fluids 100 mL/hour for 10 hours -Holding home bethanechol given increased secretions and chronic dysphagia, concern for aspiration risk  #Suspected viral upper respiratory infection Patient has 5-day history of worsening congestion, upper respiratory secretions, and cough which prompted her presentation to the ED.  COVID and flu negative. CXR without consolidations to suggest PNA. Patient does have chronic dysphagia with increased upper respiratory secretions which puts her at risk for aspiration PNA/pneumonitis though not seen on CXR on arrival.  Plan: - Respiratory panel - Mucinex - Holding home bethanecol since this can worsen respiratory secretions and patient has foley in place - Tylenol as needed  #Chronic dysphagia 2/2 hx prior strokes #Severe protein malnutrition #Right MCA stroke 2017 #Right MCA stroke, basal ganglia infarct, with hemorrhagic conversion 2019 PEG tube placed 01/03/2018 during admission for large MCA stroke.  On tube feeds though does take some medications p.o. at home.  Son and home aide assist with tube  feeds.  Patient currently not following with surgery.  PEG tube last exchanged during admission for 2022 when nursing staff noticed discoloration of tube. Failed swallow eval that admission.  Albumin low 2.9. Plan: - consult to dietitian for tube feeds and nutritional needs - home feeding supplement  - multivitamin, vitamin B12 supplement, folic acid supplement, vitamin D3 supplement.  #Mild hyperglycemia in the setting of sepsis BMP with glucose 249 on arrival, in the setting of urosepsis and suspected viral upper respiratory infection. Plan: - SSI and CBGs  #Atrial fibrillation Patient has history of A. fib for which she takes Eliquis 5 mg per 2 twice daily -Continue home  Eliquis -Holding Coreg in the setting of sepsis with low normotensive pressures  #HTN Holding home antihypertensives (amlodipine 5 mg daily per tube, Coreg 6.25 mg per tube twice daily, hydralazine 10 mg per tube 3 times daily) in the setting of with low normotensive pressures.  Son reports patient not taking doxazosin or furosemide recently.  #Hypothyroidism Last TSH 10/30/2020 normal at 1.4.  Continued on home Synthroid.  Diet: Tube feeds VTE: Eliquis IVF: 100cc/hr LR for 10hrs Code: Full   Dispo: Admit patient to Inpatient with expected length of stay greater than 2 midnights.  Signed: Ellison Carwin, MD 07/14/2021, 10:23 PM  Pager: [phone] After 5pm on weekdays and 1pm on weekends: On Call pager: [phone]

## 2021-07-14 NOTE — ED Notes (Signed)
Pt c/o butt pain but wasn't sure where. This tech placed a pillow under each side if the pt buttocks and pt stated it felt [facility] better

## 2021-07-15 ENCOUNTER — Inpatient Hospital Stay (HOSPITAL_COMMUNITY): Payer: Medicare Other

## 2021-07-15 DIAGNOSIS — A419 Sepsis, unspecified organism: Secondary | ICD-10-CM | POA: Diagnosis not present

## 2021-07-15 LAB — CBC
HCT: 29.8 % — ABNORMAL LOW (ref 36.0–46.0)
Hemoglobin: 9.3 g/dL — ABNORMAL LOW (ref 12.0–15.0)
MCH: 28.9 pg (ref 26.0–34.0)
MCHC: 31.2 g/dL (ref 30.0–36.0)
MCV: 92.5 fL (ref 80.0–100.0)
Platelets: 139 10*3/uL — ABNORMAL LOW (ref 150–400)
RBC: 3.22 MIL/uL — ABNORMAL LOW (ref 3.87–5.11)
RDW: 13.8 % (ref 11.5–15.5)
WBC: 10.9 10*3/uL — ABNORMAL HIGH (ref 4.0–10.5)
nRBC: 0 % (ref 0.0–0.2)

## 2021-07-15 LAB — RESPIRATORY PANEL BY PCR

## 2021-07-15 LAB — BASIC METABOLIC PANEL
Anion gap: 9 (ref 5–15)
BUN: 77 mg/dL — ABNORMAL HIGH (ref 8–23)
CO2: 21 mmol/L — ABNORMAL LOW (ref 22–32)
Calcium: 8.8 mg/dL — ABNORMAL LOW (ref 8.9–10.3)
Chloride: 106 mmol/L (ref 98–111)
Creatinine, Ser: 0.69 mg/dL (ref 0.44–1.00)
GFR, Estimated: >60 mL/min (ref >60)
Glucose, Bld: 176 mg/dL — ABNORMAL HIGH (ref 70–99)
Potassium: 3.7 mmol/L (ref 3.5–5.1)
Sodium: 136 mmol/L (ref 135–145)

## 2021-07-15 LAB — GLUCOSE, CAPILLARY
Glucose-Capillary: 177 mg/dL — ABNORMAL HIGH (ref 70–99)
Glucose-Capillary: 186 mg/dL — ABNORMAL HIGH (ref 70–99)
Glucose-Capillary: 182 mg/dL — ABNORMAL HIGH (ref 70–99)
Glucose-Capillary: 210 mg/dL — ABNORMAL HIGH (ref 70–99)
Glucose-Capillary: 243 mg/dL — ABNORMAL HIGH (ref 70–99)

## 2021-07-15 LAB — D-DIMER, QUANTITATIVE: D-Dimer, Quant: 0.43 ug/mL-FEU (ref 0.00–0.50)

## 2021-07-15 LAB — TSH: TSH: 1.756 u[IU]/mL (ref 0.350–4.500)

## 2021-07-15 MED ORDER — IOHEXOL 350 MG/ML SOLN
100.0000 mL | Freq: Once | INTRAVENOUS | Status: AC | PRN
  Administered 2021-07-15: 100 mL via INTRAVENOUS

## 2021-07-15 MED ORDER — CHLORHEXIDINE GLUCONATE CLOTH 2 % EX PADS
6.0000 | MEDICATED_PAD | Freq: Every day | CUTANEOUS | Status: DC
  Administered 2021-07-15 – 2021-07-19 (×5): 6 via TOPICAL

## 2021-07-15 MED ORDER — GUAIFENESIN 100 MG/5ML PO LIQD
15.0000 mL | Freq: Four times a day (QID) | ORAL | Status: DC
  Administered 2021-07-15 – 2021-07-19 (×15): 15 mL
  Filled 2021-07-15 (×14): qty 15

## 2021-07-15 MED ORDER — VITAMIN D 25 MCG (1000 UNIT) PO TABS
2000.0000 [IU] | ORAL_TABLET | Freq: Every day | ORAL | Status: DC
  Administered 2021-07-16 – 2021-07-19 (×4): 2000 [IU]
  Filled 2021-07-15 (×3): qty 2

## 2021-07-15 MED ORDER — OSMOLITE 1.5 CAL PO LIQD
1000.0000 mL | ORAL | Status: DC
  Administered 2021-07-15 – 2021-07-18 (×5): 1000 mL
  Filled 2021-07-15 (×6): qty 1000

## 2021-07-15 MED ORDER — AZITHROMYCIN 500 MG PO TABS
500.0000 mg | ORAL_TABLET | Freq: Every day | ORAL | Status: AC
  Administered 2021-07-15 – 2021-07-19 (×5): 500 mg
  Filled 2021-07-15: qty 2
  Filled 2021-07-15 (×3): qty 1
  Filled 2021-07-15: qty 2

## 2021-07-15 NOTE — Hospital Course (Addendum)
No trouble breathing  No pain  Sassy this AM lol "I'm gonna charge you for that" "3 charges"  #Sepsis 2/2 CAUTI #Chronic indwelling Foley 2/2 recurrent UTIs and urinary retention Fulfills 4/4 SIRS criteria with likely source identified.  During admission, patient was febrile 101.4, tachycardic 111, leukocytosis 14, RR 30s and UA suggestive of UTI in patient with chronic indwelling Foley.  Patient's lactic acid normal.  Foley last exchanged 10/24.  In the ED, patient received 1850 cc fluid bolus and dose of Rocephin.  Patient had a Foley catheter exchange for source control.  Patient's urine culture showed >100,000 Pseudomonal colonies and 30,000 E. Coli colonies.  Patient's blood culture showed no growth for 4 days.  Patient was started on cefepime given previous history of pseudomonal UTI.  Patient completed 3 out of 3 days of cefepime for UTI.  Patient received LR maintenance fluid 100 mL/h for 10 hours.  Patient's bethanechol was held due to it causing increased secretions and chronic dysphagia.   #Suspected viral upper respiratory infection (r/o PE) Patient has 5-day history of worsening congestion, upper respiratory secretions, and cough which prompted her presentation to the ED.  COVID and flu negative. CXR without consolidations to suggest PNA. Patient does have chronic dysphagia with increased upper respiratory secretions which puts her at risk for aspiration PNA/pneumonitis though not seen on CXR on arrival.  Patient's viral respiratory panel was negative.  Given increased shortness of breath, CT angio was performed to assess lung parenchyma as well as rule out pulmonary embolism.  CT angio was negative for acute PE; however, scan did show multilobar respiratory infection affecting most of the right lung and involving the left upper lobe, suggestive of viral/atypical etiology.  Patient was started on azithromycin which was continued through hospitalization.  Patient was started on 2 additional days  of cefdinir for CAP coverage.   #Chronic dysphagia 2/2 hx prior strokes #Severe protein malnutrition #Right MCA stroke 2017 #Right MCA stroke, basal ganglia infarct, with hemorrhagic conversion 2019 PEG tube placed 01/03/2018 during admission for large MCA stroke.  On tube feeds though does take some medications p.o. at home.  Dietitian was consulted for tube feeds and nutritional needs.  Patient was continued on multivitamin, vitamin O59, folic acid, vitamin D3 supplements   #Mild hyperglycemia in the setting of sepsis BMP with glucose 249 on arrival, in the setting of urosepsis and suspected viral upper respiratory infection.  Patient was placed on sliding scale insulin and CBGs were monitored.   #Atrial fibrillation Patient has history of A. fib for which she takes Eliquis 5 mg per 2 twice daily.  Patient was continued on Eliquis.  Coreg was held in the setting of sepsis with normotensive pressures.   #HTN Holding home antihypertensives (amlodipine 5 mg daily per tube, Coreg 6.25 mg per tube twice daily, hydralazine 10 mg per tube 3 times daily) in the setting of with low normotensive pressures.  Son reports patient not taking doxazosin or furosemide recently.   #Hypothyroidism Last TSH 10/30/2020 normal at 1.4.  Patient was continued on her home Synthroid.

## 2021-07-15 NOTE — Progress Notes (Addendum)
HD#1 Subjective:  Overnight Events: None  Patient seen and assessed in the morning.  Patient was laying in bed. Patient states that she has "no complaints".  Patient endorses feeling better and less short of breath.  Patient denies any dysuria, urinary frequency.  Patient's goal is to "just get really well".  Patient was alert and oriented to name, date of birth, and able to appropriately compute math (Nickel, dime, quarter make $0.40).  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  Discussed plan with patient to do a CT angio to assess for pulmonary embolism that may have caused sudden shortness of breath.  Discussed with patient that we are also treating her UTI that is likely the cause of her sepsis at the time of admission.  Patient verbalized understanding agreement.  Objective:  Vital signs in last 24 hours: Vitals:   07/14/21 2100 07/15/21 0000 07/15/21 0015 07/15/21 0137  BP: (!) 125/57 107/61  115/66  Pulse: 96 95  (!) 102  Resp: (!) 24 20  20   Temp:   98.6 F (37 C) 99 F (37.2 C)  TempSrc:   Oral Axillary  SpO2: 96% 97%  98%   Supplemental O2: Nasal Cannula SpO2: 98 % O2 Flow Rate (L/min): 4 L/min   Physical Exam:  Physical Exam  There were no vitals filed for this visit.   Intake/Output Summary (Last 24 hours) at 07/15/2021 1035 Last data filed at 07/15/2021 0300 Gross per 24 hour  Intake 2351.67 ml  Output 600 ml  Net 1751.67 ml   Net IO Since Admission: 1,751.67 mL [07/15/21 1035]  Pertinent Labs: CBC Latest Ref Rng & Units 07/15/2021 07/14/2021 11/12/2020  WBC 4.0 - 10.5 K/uL 10.9(H) 14.1(H) 5.3  Hemoglobin 12.0 - 15.0 g/dL 9.3(L) 10.3(L) 8.6(L)  Hematocrit 36.0 - 46.0 % 29.8(L) 32.7(L) 26.1(L)  Platelets 150 - 400 K/uL 139(L) 175 129(L)    CMP Latest Ref Rng & Units 07/15/2021 07/14/2021 11/12/2020  Glucose 70 - 99 mg/dL 176(H) 249(H) 94  BUN 8 - 23 mg/dL 77(H) 86(H) 14  Creatinine 0.44 - 1.00 mg/dL 0.69 0.77 0.40(L)   Sodium 135 - 145 mmol/L 136 136 134(L)  Potassium 3.5 - 5.1 mmol/L 3.7 3.9 4.3  Chloride 98 - 111 mmol/L 106 104 103  CO2 22 - 32 mmol/L 21(L) 22 24  Calcium 8.9 - 10.3 mg/dL 8.8(L) 9.1 8.8(L)  Total Protein 6.5 - 8.1 g/dL - 6.5 -  Total Bilirubin 0.3 - 1.2 mg/dL - 1.3(H) -  Alkaline Phos 38 - 126 U/L - 90 -  AST 15 - 41 U/L - 17 -  ALT 0 - 44 U/L - 18 -    Imaging: DG Chest Port 1 View  Result Date: 07/14/2021 CLINICAL DATA:  Sepsis EXAM: PORTABLE CHEST 1 VIEW COMPARISON:  Chest x-ray 11/08/2020 FINDINGS: Heart is mildly enlarged. Tortuous thoracic aorta with calcified plaques in the arch. Mediastinum appears stable. Mildly prominent interstitial lung markings and biapical pleural thickening similar to previous. No focal consolidation visualized. No significant pleural effusion. No pneumothorax identified. IMPRESSION: Chronic pain changes as described with no focal consolidation identified. Electronically Signed   By: Ofilia Neas M.D.   On: 07/14/2021 15:52    Assessment/Plan:   Principal Problem:   Sepsis ([place]) Active Problems:   Recurrent UTI   Protein-calorie malnutrition, severe   Acute hypoxemic respiratory failure [facility])   Patient Summary: History of Present Illness: [name] is an 63  yoF with a PMHx of HTN, HLD, prior CVA with residual left sided weakness, a fib on Eliquis, chronic anemia, chronic dysphagia with G-tube on tube feeds, urinary retention with chronic foley catheter and hypothyroidism who presented to [facility] ED with cough and congestion and new O2 requirement, and found to have sepsis secondary to CAUTI with possible concurrent viral upper respiratory infection.   #Sepsis 2/2 CAUTI #Chronic indwelling Foley 2/2 recurrent UTIs and urinary retention Fulfills 4/4 SIRS criteria with likely source identified.  Febrile 101.4, tacky 111, leukocytosis 14, RR 30s and UA suggestive of UTI in patient with chronic indwelling Foley.  Lactic acid normal.  Foley  last exchanged 10/24.  Patient currently does not follow with urology.  Takes bethanechol for urinary retention.  In the ED, patient received 1850 cc fluid bolus and dose of Rocephin. Plan: -Foley catheter exchange for source control -Urine culture pending -Blood culture pending -Broaden ABX to cefepime given previous history of Pseudomonas UTI  -Plan to narrow ABX following culture results and susceptibilities -LR maintenance fluids 100 mL/hour for 10 hours -Holding home bethanechol given increased secretions and chronic dysphagia, concern for aspiration risk   #Suspected viral upper respiratory infection (r/o PE) Patient has 5-day history of worsening congestion, upper respiratory secretions, and cough which prompted her presentation to the ED.  COVID and flu negative. CXR without consolidations to suggest PNA. Patient does have chronic dysphagia with increased upper respiratory secretions which puts her at risk for aspiration PNA/pneumonitis though not seen on CXR on arrival.  Plan: - Respiratory panel negative - Mucinex - Holding home bethanecol since this can worsen respiratory secretions and patient has foley in place - Tylenol as needed -CT angio today to rule out PE and assess lung parenchyma   #Chronic dysphagia 2/2 hx prior strokes #Severe protein malnutrition #Right MCA stroke 2017 #Right MCA stroke, basal ganglia infarct, with hemorrhagic conversion 2019 PEG tube placed 01/03/2018 during admission for large MCA stroke.  On tube feeds though does take some medications p.o. at home.  Son and home aide assist with tube feeds.  Patient currently not following with surgery.  PEG tube last exchanged during admission for 2022 when nursing staff noticed discoloration of tube. Failed swallow eval that admission.  Albumin low 2.9. Plan: - consult to dietitian for tube feeds and nutritional needs - home feeding supplement  - multivitamin, vitamin W29 supplement, folic acid supplement,  vitamin D3 supplement.   #Mild hyperglycemia in the setting of sepsis BMP with glucose 249 on arrival, in the setting of urosepsis and suspected viral upper respiratory infection. Plan: - SSI and CBGs   #Atrial fibrillation Patient has history of A. fib for which she takes Eliquis 5 mg per 2 twice daily -Continue home Eliquis -Holding Coreg in the setting of sepsis with low normotensive pressures   #HTN Holding home antihypertensives (amlodipine 5 mg daily per tube, Coreg 6.25 mg per tube twice daily, hydralazine 10 mg per tube 3 times daily) in the setting of with low normotensive pressures.  Son reports patient not taking doxazosin or furosemide recently.   #Hypothyroidism Last TSH 10/30/2020 normal at 1.4.  Continued on home Synthroid.  Diet: Tube Feeds IVF:  100 cc/hr LR for 10 hr VTE:  Eliquis Code: Full PT/OT recs: Pending TOC recs: pending   Dispo: Anticipated discharge it is unknown at this time as uncertain what is causing shortness of breath.  Anticipate 4 to 5 days of hospitalization.  France Ravens, MD 07/15/2021, 10:35 AM Pager: [phone]  Please contact the on call pager after 5 pm and on weekends at [phone].

## 2021-07-15 NOTE — Progress Notes (Signed)
Initial Nutrition Assessment  DOCUMENTATION CODES:   Not applicable  INTERVENTION:   - Please obtain admission weight  Continue home tube feeding regimen via G-tube: - Osmolite 1.5 @ 50 ml/hr (1200 ml/day) - ProSource TF 45 ml daily - Free water flushes of 200 ml q 6 hours  Tube feeding regimen provides 1840 kcal, 86 grams of protein, and 914 ml of H2O.   Total free water with flushes: 1714 ml   If appropriate and feasible for patient, recommend transitioning to bolus tube feeding regimen: - 1 carton (237 ml) Osmolite 1.5 cal formula 5 times daily via G-tube - ProSource TF 45 ml daily - Free water flushes of 20 ml q 6 hours  Recommended bolus tube feeding regimen would provide 1815 kcal, 86 grams of protein, and 1705 ml of H2O.   NUTRITION DIAGNOSIS:   Inadequate oral intake related to dysphagia as evidenced by NPO status.  GOAL:   Patient will meet greater than or equal to 90% of their needs  MONITOR:   Labs, Weight trends, TF tolerance, I & O's  REASON FOR ASSESSMENT:   Consult Enteral/tube feeding initiation and management, Assessment of nutrition requirement/status  ASSESSMENT:   87 year old female who presented to the ED on 11/04 with cough, fever. PMH of HTN, HLD, prior CVA with residual left-sided weakness, atrial fibrillation, anemia, chronic dysphagia s/p G-tube (01/03/18) on tube feeds, urinary retention with chronic foley. Pt admitted with sepsis 2/2 CAUTI.  RD was unable to reach pt via phone call to room. Also attempted to reach pt's son Aniah Pauli via phone number listed in chart; however, no answer. Unable to obtain more details at this time regarding pt's PO intake at home and home TF regimen.  No weight or height available this admission. Spoke with RN via phone call who will try to obtain weight when able.  Current TF: Osmolite 1.5 @ 50 ml/hr, ProSource TF 45 ml daily, free water flushes 200 ml q 6 hours  Medications reviewed and include:  cholecalciferol, folic acid, SSI, MVI with minerals daily, vitamin B-12, IV abx IVF: LR @ 100 ml/hr  Labs reviewed: BUN 77, hemoglobin 9.3 CBG's: 177-186  UOP: 600 ml x 12 hours I/O's: +1.7 L since admit  NUTRITION - FOCUSED PHYSICAL EXAM:  Unable to complete at this time. RD working remotely.  Diet Order:   Diet Order             Diet NPO time specified  Diet effective now                   EDUCATION NEEDS:   Not appropriate for education at this time  Skin:  Skin Assessment: Reviewed RN Assessment  Last BM:  07/15/21  Height:   Ht Readings from Last 1 Encounters:  11/03/20 5\' 7"  (1.702 m)    Weight:   Wt Readings from Last 1 Encounters:  11/03/20 83 kg    BMI:  28.65 (using height and weight from 11/03/20)  Estimated Nutritional Needs:   Kcal:  1650-1850  Protein:  80-95 grams  Fluid:  1.6-1.8 L    Gustavus Bryant, MS, RD, LDN Inpatient Clinical Dietitian Please see AMiON for contact information.

## 2021-07-16 LAB — CBC
HCT: 28.8 % — ABNORMAL LOW (ref 36.0–46.0)
Hemoglobin: 9 g/dL — ABNORMAL LOW (ref 12.0–15.0)
MCH: 29.4 pg (ref 26.0–34.0)
MCHC: 31.3 g/dL (ref 30.0–36.0)
MCV: 94.1 fL (ref 80.0–100.0)
Platelets: 146 10*3/uL — ABNORMAL LOW (ref 150–400)
RBC: 3.06 MIL/uL — ABNORMAL LOW (ref 3.87–5.11)
RDW: 13.8 % (ref 11.5–15.5)
WBC: 8.7 10*3/uL (ref 4.0–10.5)
nRBC: 0 % (ref 0.0–0.2)

## 2021-07-16 LAB — HEMOGLOBIN A1C
Hgb A1c MFr Bld: 6.6 % — ABNORMAL HIGH (ref 4.8–5.6)
Mean Plasma Glucose: 142.72 mg/dL

## 2021-07-16 LAB — BASIC METABOLIC PANEL
Anion gap: 7 (ref 5–15)
BUN: 52 mg/dL — ABNORMAL HIGH (ref 8–23)
CO2: 21 mmol/L — ABNORMAL LOW (ref 22–32)
Calcium: 8.6 mg/dL — ABNORMAL LOW (ref 8.9–10.3)
Chloride: 108 mmol/L (ref 98–111)
Creatinine, Ser: 0.54 mg/dL (ref 0.44–1.00)
GFR, Estimated: >60 mL/min (ref >60)
Glucose, Bld: 279 mg/dL — ABNORMAL HIGH (ref 70–99)
Potassium: 3.9 mmol/L (ref 3.5–5.1)
Sodium: 136 mmol/L (ref 135–145)

## 2021-07-16 LAB — GLUCOSE, CAPILLARY
Glucose-Capillary: 265 mg/dL — ABNORMAL HIGH (ref 70–99)
Glucose-Capillary: 246 mg/dL — ABNORMAL HIGH (ref 70–99)
Glucose-Capillary: 229 mg/dL — ABNORMAL HIGH (ref 70–99)
Glucose-Capillary: 218 mg/dL — ABNORMAL HIGH (ref 70–99)

## 2021-07-16 MED ORDER — CARVEDILOL 6.25 MG PO TABS
6.2500 mg | ORAL_TABLET | Freq: Two times a day (BID) | ORAL | Status: DC
  Administered 2021-07-16 – 2021-07-19 (×6): 6.25 mg
  Filled 2021-07-16 (×6): qty 1

## 2021-07-16 MED ORDER — IPRATROPIUM-ALBUTEROL 0.5-2.5 (3) MG/3ML IN SOLN: 3.0000 mL | Freq: Four times a day (QID) | RESPIRATORY_TRACT | Status: DC | PRN

## 2021-07-16 MED ORDER — INSULIN ASPART 100 UNIT/ML IJ SOLN
0.0000 [IU] | INTRAMUSCULAR | Status: DC
  Administered 2021-07-16 – 2021-07-17 (×4): 5 [IU] via SUBCUTANEOUS
  Administered 2021-07-17: 3 [IU] via SUBCUTANEOUS
  Administered 2021-07-17: 5 [IU] via SUBCUTANEOUS
  Administered 2021-07-17: 8 [IU] via SUBCUTANEOUS
  Administered 2021-07-17: 5 [IU] via SUBCUTANEOUS
  Administered 2021-07-17: 2 [IU] via SUBCUTANEOUS
  Administered 2021-07-18 (×2): 5 [IU] via SUBCUTANEOUS
  Administered 2021-07-18: 3 [IU] via SUBCUTANEOUS
  Administered 2021-07-18: 5 [IU] via SUBCUTANEOUS
  Administered 2021-07-18: 3 [IU] via SUBCUTANEOUS
  Administered 2021-07-18: 5 [IU] via SUBCUTANEOUS
  Administered 2021-07-18: 3 [IU] via SUBCUTANEOUS
  Administered 2021-07-19: 5 [IU] via SUBCUTANEOUS
  Administered 2021-07-19: 3 [IU] via SUBCUTANEOUS

## 2021-07-16 NOTE — Progress Notes (Addendum)
HD#2 Subjective:  Overnight Events: No overnight events  Patient is seen at bedside.  [name] appears comfortable in no acute distress.  Her oxygen nasal cannula was out of her nose.  [name] endorses mild epigastric pain.  Said that her breathing is fine.  Objective:  Vital signs in last 24 hours: Vitals:   07/15/21 1201 07/15/21 1400 07/15/21 2127 07/16/21 0547  BP: 114/66  (!) 124/54 110/65  Pulse: 98  96 (!) 104  Resp: 20     Temp: 99.2 F (37.3 C)  99.5 F (37.5 C) 98.8 F (37.1 C)  TempSrc:   Axillary Oral  SpO2: 99%  94% 100%  Weight:  90.8 kg     Supplemental O2: Nasal Cannula SpO2: 100 % O2 Flow Rate (L/min): 2 L/min   Physical Exam:  Physical Exam Constitutional:      General: [name] is not in acute distress.    Appearance: [name] is not ill-appearing.  HENT:     Head: Normocephalic.  Eyes:     General:        Right eye: No discharge.        Left eye: No discharge.     Conjunctiva/sclera: Conjunctivae normal.  Cardiovascular:     Rate and Rhythm: Normal rate and regular rhythm.     Heart sounds: Normal heart sounds.     Comments: No LE edema Pulmonary:     Effort: Pulmonary effort is normal. No respiratory distress.     Comments: Mild wheezes Abdominal:     General: Bowel sounds are normal. There is no distension.     Tenderness: There is abdominal tenderness (Mild tenderness to palpation epigastric region).  Musculoskeletal:        General: Normal range of motion.  Skin:    General: Skin is warm and dry.  Neurological:     General: No focal deficit present.     Mental Status: [name] is alert and oriented to person, place, and time.  Psychiatric:        Mood and Affect: Mood normal.        Behavior: Behavior normal.    Filed Weights   07/15/21 1400  Weight: 90.8 kg     Intake/Output Summary (Last 24 hours) at 07/16/2021 0944 Last data filed at 07/15/2021 1700 Gross per 24 hour  Intake 700 ml  Output 1300 ml  Net -600 ml   Net IO Since Admission:  1,151.67 mL [07/16/21 0944]  Pertinent Labs: CBC Latest Ref Rng & Units 07/16/2021 07/15/2021 07/14/2021  WBC 4.0 - 10.5 K/uL 8.7 10.9(H) 14.1(H)  Hemoglobin 12.0 - 15.0 g/dL 9.0(L) 9.3(L) 10.3(L)  Hematocrit 36.0 - 46.0 % 28.8(L) 29.8(L) 32.7(L)  Platelets 150 - 400 K/uL 146(L) 139(L) 175    CMP Latest Ref Rng & Units 07/16/2021 07/15/2021 07/14/2021  Glucose 70 - 99 mg/dL 279(H) 176(H) 249(H)  BUN 8 - 23 mg/dL 52(H) 77(H) 86(H)  Creatinine 0.44 - 1.00 mg/dL 0.54 0.69 0.77  Sodium 135 - 145 mmol/L 136 136 136  Potassium 3.5 - 5.1 mmol/L 3.9 3.7 3.9  Chloride 98 - 111 mmol/L 108 106 104  CO2 22 - 32 mmol/L 21(L) 21(L) 22  Calcium 8.9 - 10.3 mg/dL 8.6(L) 8.8(L) 9.1  Total Protein 6.5 - 8.1 g/dL - - 6.5  Total Bilirubin 0.3 - 1.2 mg/dL - - 1.3(H)  Alkaline Phos 38 - 126 U/L - - 90  AST 15 - 41 U/L - - 17  ALT 0 - 44 U/L - -  18    Imaging: CT Angio Chest Pulmonary Embolism (PE) W or WO Contrast  Result Date: 07/15/2021 CLINICAL DATA:  87 year old female with sepsis.  Cough. EXAM: CT ANGIOGRAPHY CHEST WITH CONTRAST TECHNIQUE: Multidetector CT imaging of the chest was performed using the standard protocol during bolus administration of intravenous contrast. Multiplanar CT image reconstructions and MIPs were obtained to evaluate the vascular anatomy. CONTRAST:  116mL OMNIPAQUE IOHEXOL 350 MG/ML SOLN COMPARISON:  Portable chest yesterday. [facility] CTA chest 02/23/2010. FINDINGS: Cardiovascular: Good contrast bolus timing in the pulmonary arterial tree. Mild respiratory motion. No central or hilar pulmonary artery filling defect. Upper and middle lobe branches also appear patent. But bilateral lower lobe branch detail is degraded by motion. Coronary artery atherosclerosis and stent. Borderline cardiomegaly. No pericardial effusion. Negative visible aorta aside from atherosclerosis. Mediastinum/Nodes: Small mediastinal lymph nodes appear reactive. Similar hilar lymph nodes bilaterally.  Lungs/Pleura: Atelectatic changes to the major airways, especially at the hila. Mild respiratory motion. Upper lobe tree-in-bud nodular opacity greater on the right and with more confluent peribronchial and peripheral nodular right upper lobe opacity, confluent dependently. There is also patchy peripheral opacity in both segments of the right middle lobe. Superimposed mild atelectasis in both lungs. Trace right pleural effusion. Possible additional mild peribronchial opacity in the right lower lobe although motion artifact there. Upper Abdomen: Negative visible liver, adrenal glands and bowel in the upper abdomen. Dense probable embolization coils at the splenic hilum, although the visible splenic parenchyma is normal. The patient had a history of splenic artery aneurysm. Evidence of pancreatic dystrophic calcifications. Somewhat atrophic appearing visible right kidney. Musculoskeletal: Degenerative changes in the spine. No acute or suspicious osseous lesion identified. Review of the MIP images confirms the above findings. IMPRESSION: 1. Lower lobe pulmonary artery detail degraded by motion. Otherwise negative for acute PE. 2. Multilobar respiratory infection, affecting most of the right lung and also involving the left upper lobe. Favor viral/atypical etiology. Trace right pleural effusion. Reactive appearing mediastinal and hilar lymph nodes. 3. Coronary artery and Aortic Atherosclerosis (ICD10-I70.0). 4. Partially visible sequelae of splenic artery aneurysm embolization. Electronically Signed   By: Genevie Ann M.D.   On: 07/15/2021 12:01    Assessment/Plan:   Principal Problem:   Acute hypoxemic respiratory failure (HCC) Active Problems:   Recurrent UTI   Protein-calorie malnutrition, severe   Sepsis [facility])   Patient Summary: [name] is an 87 yoF with a PMHx of HTN, HLD, prior CVA with residual left sided weakness, a fib on Eliquis, chronic anemia, chronic dysphagia with G-tube on tube feeds,  urinary retention with chronic foley catheter and hypothyroidism who was admitted for acute hypoxic respiratory failure secondary to atypical pneumonia.  Also treating for UTI in the setting of chronic Foley catheter.  Acute hypoxic respiratory failure Atypical pneumonia CTA was negative for PE but showed viral/atypical multilobar infection.  This is likely a viral infection but given respiratory panel was negative will need coverage for atypical organisms.  [name] is already on IV cefepime for UTI.  Azithromycin is added for atypical coverage.  Her oxygen is reduce to 2 L -Continue cefepime (day 2) and azithromycin (day 2) -Added DuoNebs PRN for wheezing -Holding home bethanecol since this can worsen respiratory secretions and patient has foley in place  UTI in the setting of chronic indwelling Foley catheter  UA showed pyuria and bacteriuria.  Pending urine culture. -Continue cefepime (day 2) -Follow-up on urine culture   Chronic dysphagia 2/2 hx prior strokes Severe protein  malnutrition Right MCA stroke 2017 Right MCA stroke, basal ganglia infarct, with hemorrhagic conversion 2019 PEG tube last exchanged during admission for 2022  -Appreciate dietitian recommendation -Continue home feeding supplements with multivitamin, vitamin S97 supplement, folic acid supplement, vitamin D3 supplement.   Hyperglycemia  CBG remains elevated.  -Will change sliding scale to every 4 hours to monitor closely with the tube feed. -Can add long-acting if needed.   Atrial fibrillation Patient has history of A. fib for which [name] takes Eliquis 5 mg per 2 twice daily -Continue home Eliquis -Will resume Coreg.  Her heart rate has been trending up.  HTN Holding home antihypertensives (amlodipine 5 mg daily per tube, Coreg 6.25 mg per tube twice daily, hydralazine 10 mg per tube 3 times daily) in the setting of with low normotensive pressures.  Son reports patient not taking doxazosin or furosemide recently.    Hypothyroidism Last TSH 10/30/2020 normal at 1.4.  Continued on home Synthroid.   Diet: Tube Feeds IVF:  100 cc/hr LR for 10 hr VTE:  Eliquis Code: Full PT/OT recs: Pending TOC recs: pending    Gaylan Gerold, DO 07/16/2021, 9:44 AM Pager: [phone]  Please contact the on call pager after 5 pm and on weekends at [phone].

## 2021-07-17 DIAGNOSIS — N3001 Acute cystitis with hematuria: Secondary | ICD-10-CM | POA: Diagnosis not present

## 2021-07-17 DIAGNOSIS — J9601 Acute respiratory failure with hypoxia: Secondary | ICD-10-CM | POA: Diagnosis not present

## 2021-07-17 LAB — BASIC METABOLIC PANEL
Anion gap: 8 (ref 5–15)
BUN: 40 mg/dL — ABNORMAL HIGH (ref 8–23)
CO2: 21 mmol/L — ABNORMAL LOW (ref 22–32)
Calcium: 8.7 mg/dL — ABNORMAL LOW (ref 8.9–10.3)
Chloride: 108 mmol/L (ref 98–111)
Creatinine, Ser: 0.53 mg/dL (ref 0.44–1.00)
GFR, Estimated: >60 mL/min (ref >60)
Glucose, Bld: 226 mg/dL — ABNORMAL HIGH (ref 70–99)
Potassium: 4.1 mmol/L (ref 3.5–5.1)
Sodium: 137 mmol/L (ref 135–145)

## 2021-07-17 LAB — CBC
HCT: 25.5 % — ABNORMAL LOW (ref 36.0–46.0)
Hemoglobin: 8 g/dL — ABNORMAL LOW (ref 12.0–15.0)
MCH: 28.8 pg (ref 26.0–34.0)
MCHC: 31.4 g/dL (ref 30.0–36.0)
MCV: 91.7 fL (ref 80.0–100.0)
Platelets: 165 10*3/uL (ref 150–400)
RBC: 2.78 MIL/uL — ABNORMAL LOW (ref 3.87–5.11)
RDW: 13.6 % (ref 11.5–15.5)
WBC: 9.2 10*3/uL (ref 4.0–10.5)
nRBC: 0 % (ref 0.0–0.2)

## 2021-07-17 LAB — GLUCOSE, CAPILLARY
Glucose-Capillary: 145 mg/dL — ABNORMAL HIGH (ref 70–99)
Glucose-Capillary: 177 mg/dL — ABNORMAL HIGH (ref 70–99)
Glucose-Capillary: 204 mg/dL — ABNORMAL HIGH (ref 70–99)
Glucose-Capillary: 219 mg/dL — ABNORMAL HIGH (ref 70–99)
Glucose-Capillary: 242 mg/dL — ABNORMAL HIGH (ref 70–99)
Glucose-Capillary: 254 mg/dL — ABNORMAL HIGH (ref 70–99)

## 2021-07-17 LAB — URINE CULTURE: Culture: >=100000 — AB

## 2021-07-17 MED ORDER — INSULIN ASPART 100 UNIT/ML IJ SOLN: 4.0000 [IU] | INTRAMUSCULAR | Status: DC

## 2021-07-17 MED ORDER — INSULIN ASPART 100 UNIT/ML IJ SOLN
3.0000 [IU] | INTRAMUSCULAR | Status: DC
  Administered 2021-07-17 – 2021-07-19 (×10): 3 [IU] via SUBCUTANEOUS

## 2021-07-17 MED ORDER — CEFDINIR 300 MG PO CAPS
300.0000 mg | ORAL_CAPSULE | Freq: Two times a day (BID) | ORAL | Status: DC
  Administered 2021-07-18 – 2021-07-19 (×2): 300 mg
  Filled 2021-07-17 (×3): qty 1

## 2021-07-17 NOTE — Progress Notes (Addendum)
HD#3 Subjective:  Overnight Events: No overnight events  Patient is seen at bedside.  Patient still has productive cough.  Patient denies shortness of breath and denies abdominal pain.  Patient denies urinary symptoms at this time.  Discussed plan with patient plan to transition to oral cefdinir for 2 days to finish 5 days for atypical pneumonia.  Discussed that since she has completed 3 out of 3 days of cefepime for UTI, plan to discontinue this today.  Also discussed plan to continue to wean patient off oxygen as she does not regularly use home oxygen.  Patient verbalized agreement and understanding.  Objective:  Vital signs in last 24 hours: Vitals:   07/16/21 1306 07/16/21 1550 07/16/21 2003 07/17/21 0338  BP: 127/66 123/66 139/75 129/65  Pulse: 99 100 95 92  Resp: 18 18 18 20   Temp:   98.8 F (37.1 C) 98.4 F (36.9 C)  TempSrc:   Axillary Oral  SpO2:   98% 97%  Weight:       Supplemental O2: Nasal Cannula SpO2: 97 % O2 Flow Rate (L/min): 2 L/min   Physical Exam:  Physical Exam Constitutional:      General: She is not in acute distress.    Appearance: She is not ill-appearing.  HENT:     Head: Normocephalic.  Eyes:     General:        Right eye: No discharge.        Left eye: No discharge.     Conjunctiva/sclera: Conjunctivae normal.  Cardiovascular:     Rate and Rhythm: Normal rate and regular rhythm.     Heart sounds: Normal heart sounds.     Comments: No LE edema Pulmonary:     Effort: Pulmonary effort is normal. No respiratory distress.     Comments: Mild wheezes Abdominal:     General: Bowel sounds are normal. There is no distension.     Tenderness: There is no abdominal tenderness (Mild tenderness to palpation epigastric region).  Musculoskeletal:        General: Normal range of motion.  Skin:    General: Skin is warm and dry.  Neurological:     General: No focal deficit present.     Mental Status: She is alert and oriented to person, place, and  time.  Psychiatric:        Mood and Affect: Mood normal.        Behavior: Behavior normal.    Filed Weights   07/15/21 1400  Weight: 90.8 kg     Intake/Output Summary (Last 24 hours) at 07/17/2021 0638 Last data filed at 07/17/2021 0051 Gross per 24 hour  Intake 100 ml  Output 1751 ml  Net -1651 ml    Net IO Since Admission: -499.33 mL [07/17/21 0638]  Pertinent Labs: CBC Latest Ref Rng & Units 07/17/2021 07/16/2021 07/15/2021  WBC 4.0 - 10.5 K/uL 9.2 8.7 10.9(H)  Hemoglobin 12.0 - 15.0 g/dL 8.0(L) 9.0(L) 9.3(L)  Hematocrit 36.0 - 46.0 % 25.5(L) 28.8(L) 29.8(L)  Platelets 150 - 400 K/uL 165 146(L) 139(L)    CMP Latest Ref Rng & Units 07/17/2021 07/16/2021 07/15/2021  Glucose 70 - 99 mg/dL 226(H) 279(H) 176(H)  BUN 8 - 23 mg/dL 40(H) 52(H) 77(H)  Creatinine 0.44 - 1.00 mg/dL 0.53 0.54 0.69  Sodium 135 - 145 mmol/L 137 136 136  Potassium 3.5 - 5.1 mmol/L 4.1 3.9 3.7  Chloride 98 - 111 mmol/L 108 108 106  CO2 22 - 32 mmol/L 21(L) 21(L)  21(L)  Calcium 8.9 - 10.3 mg/dL 8.7(L) 8.6(L) 8.8(L)  Total Protein 6.5 - 8.1 g/dL - - -  Total Bilirubin 0.3 - 1.2 mg/dL - - -  Alkaline Phos 38 - 126 U/L - - -  AST 15 - 41 U/L - - -  ALT 0 - 44 U/L - - -    Imaging: No results found.  Assessment/Plan:   Principal Problem:   Acute hypoxemic respiratory failure (HCC) Active Problems:   Recurrent UTI   Protein-calorie malnutrition, severe   Sepsis [facility])   Patient Summary: [name] is an 58 yoF with a PMHx of HTN, HLD, prior CVA with residual left sided weakness, a fib on Eliquis, chronic anemia, chronic dysphagia with G-tube on tube feeds, urinary retention with chronic foley catheter and hypothyroidism who was admitted for acute hypoxic respiratory failure secondary to atypical pneumonia.  Patient has completed 3 out of 3 days of cefepime for UTI.  Acute hypoxic respiratory failure Atypical pneumonia CTA was negative for PE but showed viral/atypical multilobar  infection.  This is likely a viral infection but given respiratory panel was negative will need coverage for atypical organisms.  She is already on IV cefepime for UTI.  Azithromycin is added for atypical coverage.  Patient still on 2 L of oxygen. -Continue azithromycin to complete a 5 day course - Change cefepime to cefdinir for 2 more days to complete 5 days for CAP coverage  - Wean oxygen as able  -DuoNebs PRN for wheezing -Holding home bethanecol since this can worsen respiratory secretions and patient has foley in place  UTI in the setting of chronic indwelling Foley catheter  UA showed pyuria and bacteriuria. Foley catheter exchanged on admission.  -Urine culture showed greater than 100,000 pseudomonal colonies and 30,000 E. Coli.  Sensitivities pending -Completed 3/3 days of cefepime for UTI   Chronic dysphagia 2/2 hx prior strokes Severe protein malnutrition Right MCA stroke 2017 Right MCA stroke, basal ganglia infarct, with hemorrhagic conversion 2019 PEG tube last exchanged during admission for 2022  -Appreciate dietitian recommendation -Continue home feeding supplements with multivitamin, vitamin H60 supplement, folic acid supplement, vitamin D3 supplement.   Hyperglycemia  CBG remains elevated.  -SSI every 4 hours to monitor closely with the tube feed. -Added novolog 3 units q4 hours for tube feeding coverage   Atrial fibrillation Patient has history of A. fib for which she takes Eliquis 5 mg per 2 twice daily -Continue home Eliquis -Continue home Coreg.  Her heart rate has been in the 90s today.  HTN Holding home antihypertensives (amlodipine 5 mg daily per tube, Coreg 6.25 mg per tube twice daily, hydralazine 10 mg per tube 3 times daily) in the setting of with low normotensive pressures.  Son reports patient not taking doxazosin or furosemide recently.   Hypothyroidism Last TSH 10/30/2020 normal at 1.4.  Continued on home Synthroid.   Diet: Tube Feeds IVF:  100  cc/hr LR for 10 hr VTE:  Eliquis Code: Full PT/OT recs: Pending TOC recs: pending    France Ravens, MD 07/17/2021, 6:38 AM Pager: [phone]  Please contact the on call pager after 5 pm and on weekends at [phone].

## 2021-07-17 NOTE — Progress Notes (Signed)
Inpatient Diabetes Program Recommendations  AACE/ADA: New Consensus Statement on Inpatient Glycemic Control   Target Ranges:  Prepandial:   less than 140 mg/dL      Peak postprandial:   less than 180 mg/dL (1-2 hours)      Critically ill patients:  140 - 180 mg/dL   Results for SONALI, WIVELL (MRN [id number]) as of 07/17/2021 11:49  Ref. Range 07/16/2021 07:54 07/16/2021 13:04 07/16/2021 15:52 07/16/2021 20:05 07/17/2021 00:36 07/17/2021 03:42 07/17/2021 08:28  Glucose-Capillary Latest Ref Range: 70 - 99 mg/dL 265 (H) 246 (H) 229 (H) 218 (H) 219 (H) 145 (H) 177 (H)    Review of Glycemic Control  Current orders for Inpatient glycemic control: Novolog 0-15 units Q4H; Osmolitie @ 50 ml/hr  Inpatient Diabetes Program Recommendations:    Insulin-Tube Feeding: Please consider ordering Novolog 3 units Q4H for tube feeding coverage. If tube feeding is stopped or held then Novolog tube feeding coverage should also be stopped or held.  Thanks, Barnie Alderman, RN, MSN, CDE Diabetes Coordinator Inpatient Diabetes Program [phone] (Team Pager from 8am to 5pm)

## 2021-07-17 NOTE — Progress Notes (Signed)
Pharmacy Antibiotic Note  [name] is a 87 y.o. female admitted on 07/14/2021 with UTI.  Pharmacy has been consulted for cefepime dosing.  Patient with a history of HTN, HLD, prior CVA with residual left sided weakness, a fib on Eliquis, chronic anemia, chronic dysphagia with G-tube on tube feeds, urinary retention with chronic foley catheter and hypothyroidism.  Patient with history of pseudomonas in urine.  Wbc down to wnl. Scr remains <1. Urine culture grew out pseudomonas and e.coli with sensitivities pending.   Plan: Cefepime 2g q12h unless change in renal function Trend WBC, Fever, Renal function, & Clinical course F/u cultures Levels at steady state De-escalate when able  Weight: 90.8 kg (200 lb 2.8 oz)  Temp (24hrs), Avg:98.5 F (36.9 C), Min:98.4 F (36.9 C), Max:98.8 F (37.1 C)  Recent Labs  Lab 07/14/21 1509 07/14/21 1522 07/15/21 0435 07/16/21 0339 07/17/21 0017  WBC 14.1*  --  10.9* 8.7 9.2  CREATININE 0.77  --  0.69 0.54 0.53  LATICACIDVEN  --  1.6  --   --   --      CrCl cannot be calculated (Unknown ideal weight.).    Allergies  Allergen Reactions   Aspirin Other (See Comments)    Causes blood in urine    Antimicrobials this admission: Azith 11/5>> Cefepime 11/5> CTX 11/4 x1  Microbiology results: 11/4 BCx NGTD 11/4 UCx>> pseudomonas, e.coli sens pending 11/4 MRSA negative 11/4 Resp panel: negative  Onnie Boer, PharmD, BCIDP, AAHIVP, CPP Infectious Disease Pharmacist 07/17/2021 8:21 AM

## 2021-07-17 NOTE — Discharge Instructions (Signed)

## 2021-07-17 NOTE — Progress Notes (Addendum)
PT Cancellation Note  Patient Details Name: [name] MRN: [id number] DOB: [birth year]   Cancelled Treatment:    Reason Eval/Treat Not Completed: PT screened, no needs identified, will sign off  Per chart, pt is bedbound at home and uses a hoyer lift if transferred. Attempted to call son to confirm with no answer. No PT needs identified (see also OT eval). PT is signing off.  *Addendum-Contacted by ortho tech re: MD order for Left palmguard.Marland KitchenMarland Kitchenforwarded message to Occupational Therapy.    Arby Barrette, PT Acute Rehabilitation Services  Pager [phone] Office [phone]   Rexanne Mano 07/17/2021, 10:44 AM

## 2021-07-17 NOTE — Evaluation (Signed)
Occupational Therapy Evaluation Patient Details Name: [name] MRN: [id number] DOB: 1934/07/01 Today's Date: 07/17/2021   History of Present Illness [name] is an 15 yoF who was admitted for acute hypoxic respiratory failure secondary to atypical pneumonia. +sepsis 2/2 UTI (chronic indwelling catheter); CT angio chest negative for PE   PMHx of HTN, HLD, prior CVA with residual left sided weakness, a fib on Eliquis, chronic anemia, chronic dysphagia with G-tube on tube feeds, urinary retention with chronic foley catheter and hypothyroidism   Clinical Impression   Pt pleasantly confused, only oriented to person at the time of eval. PLOF and home set up obtained from prior admission chart notes: Lives at home with husband and son, dependent for all ADLs and dependent hoyer transfers to wc/recliner at baseline. Pt is currently functioning at her total A baseline with complaints of pain with PROM of LUE. Due to pt's flexed posturing of the L hand, she will benefit from a palm guard or resting hand splint. Attempted to call pt's son with no answer with hopes to inquire about pt's splints at home, if any, and to confirm PLOF. Due to pt performing at baseline she does not have acute OT needs, will sign off at this time. Recommend d/c back to home with total A care.      Recommendations for follow up therapy are one component of a multi-disciplinary discharge planning process, led by the attending physician.  Recommendations may be updated based on patient status, additional functional criteria and insurance authorization.   Follow Up Recommendations  No OT follow up (pt's home is well equipped, if pt's husband and son are able to care for pt at her current level, recommend d/c to home. If not, pt may need long-tern institutional care without follow up.)    Assistance Recommended at Discharge Frequent or constant Supervision/Assistance  Functional Status Assessment  Patient has had a  recent decline in their functional status and demonstrates the ability to make significant improvements in function in a reasonable and predictable amount of time.  Equipment Recommendations  None recommended by OT    Recommendations for Other Services       Precautions / Restrictions Precautions Precautions: Fall Precaution Comments: G-tube, catheter Restrictions Weight Bearing Restrictions: No      Mobility Bed Mobility Overal bed mobility: Needs Assistance Bed Mobility: Rolling Rolling: Total assist         General bed mobility comments: Rolled to the L with total A for hygiene    Transfers Overall transfer level: Needs assistance                 General transfer comment: deferred; pt is hoyer transfer at baseline      Balance Overall balance assessment: Needs assistance     Sitting balance - Comments: not attempted for safety                                   ADL either performed or assessed with clinical judgement   ADL Overall ADL's : Needs assistance/impaired Eating/Feeding: Total assistance;NPO Eating/Feeding Details (indicate cue type and reason): g-tube feeds at baseline Grooming: Maximal assistance;Wash/dry face;Bed level Grooming Details (indicate cue type and reason): max cues for pt to use wet rag to wash face, max physical assist to reach all areas Upper Body Bathing: Total assistance   Lower Body Bathing: Total assistance   Upper Body Dressing : Total assistance  Lower Body Dressing: Total assistance   Toilet Transfer: Total assistance Toilet Transfer Details (indicate cue type and reason): indwelling catheter at baseline Toileting- Clothing Manipulation and Hygiene: Total assistance       Functional mobility during ADLs: Total assistance General ADL Comments: this is likely pt's baseline; attemted to contact pt's son with no answer.     Vision   Additional Comments: difficult to assess due to cognition             Pertinent Vitals/Pain Pain Assessment: Faces Faces Pain Scale: Hurts a little bit Pain Location: LUE with PROM Pain Descriptors / Indicators: Grimacing;Guarding Pain Intervention(s): Monitored during session     Hand Dominance Right   Extremity/Trunk Assessment Upper Extremity Assessment Upper Extremity Assessment: LUE deficits/detail LUE Deficits / Details: no AROM, limited PROM. Pt with complaints of pain with PROM of all joints of LUE. Flexed posturing; may benefit from a palm guard LUE Sensation: decreased light touch LUE Coordination: decreased fine motor;decreased gross motor   Lower Extremity Assessment Lower Extremity Assessment: Defer to PT evaluation   Cervical / Trunk Assessment Cervical / Trunk Assessment:  (unable to assess)   Communication Communication Communication: Expressive difficulties   Cognition Arousal/Alertness: Awake/alert Behavior During Therapy: Flat affect Overall Cognitive Status: History of cognitive impairments - at baseline                                 General Comments: pt only orietned to self, reported that she was working, walking, driving, and getting in/out of the shower PTA. According to admission 3/22 pt total A for all aspects of care.,     General Comments  VSS throughout; pt pleasantly confused. Attempted to contact son to confirm home set up and PLOF, no answer.    Exercises     Shoulder Instructions      Home Living Family/patient expects to be discharged to:: Private residence                                 Additional Comments: PLOF obtained from chart, hoyer to wc/recliner, motorized bed, catheter at baseline      Prior Functioning/Environment Prior Level of Function : Needs assist;Patient poor historian/Family not available       Physical Assist : Mobility (physical);ADLs (physical) Mobility (physical): Bed mobility;Transfers ADLs (physical):  Feeding;Grooming;Bathing;Dressing;Toileting;IADLs Mobility Comments: dependent hoyer lift ADLs Comments: total A per chart, pt not accurate historian        OT Problem List: Decreased strength;Decreased range of motion;Decreased activity tolerance;Impaired balance (sitting and/or standing);Decreased safety awareness;Decreased cognition;Decreased knowledge of precautions;Pain;Impaired UE functional use      OT Treatment/Interventions:      OT Goals(Current goals can be found in the care plan section) Acute Rehab OT Goals Patient Stated Goal: unable to state OT Goal Formulation: Patient unable to participate in goal setting  OT Frequency:     Barriers to D/C:            Co-evaluation              AM-PAC OT "6 Clicks" Daily Activity     Outcome Measure Help from another person eating meals?: Total Help from another person taking care of personal grooming?: A Lot Help from another person toileting, which includes using toliet, bedpan, or urinal?: Total Help from another person bathing (including washing, rinsing, drying)?: Total Help  from another person to put on and taking off regular upper body clothing?: Total Help from another person to put on and taking off regular lower body clothing?: Total 6 Click Score: 7   End of Session Equipment Utilized During Treatment: Oxygen Nurse Communication: Mobility status  Activity Tolerance: Patient tolerated treatment well Patient left: in bed;with bed alarm set;with call bell/phone within reach  OT Visit Diagnosis: Unsteadiness on feet (R26.81);Muscle weakness (generalized) (M62.81);Hemiplegia and hemiparesis;Pain Hemiplegia - Right/Left: Left Hemiplegia - dominant/non-dominant: Non-Dominant Hemiplegia - caused by: Cerebral infarction                Time: 4098-1191 OT Time Calculation (min): 20 min Charges:  OT General Charges $OT Visit: 1 Visit OT Evaluation $OT Eval Moderate Complexity: 1 Mod   Maysie Parkhill A  Heddy Vidana 07/17/2021, 8:53 AM

## 2021-07-17 NOTE — Care Management Important Message (Signed)
Important Message  Patient Details  Name: LONNA RABOLD MRN: [id number] Date of Birth: 1934-07-29   Medicare Important Message Given:  Yes     Omir Cooprider Montine Circle 07/17/2021, 4:14 PM

## 2021-07-17 NOTE — Progress Notes (Signed)
Orthopedic Tech Progress Note Patient Details:  [name] 1934-08-28 [id number]  PALM GUARD will be made by OT  Patient ID: [name], female   DOB: 05/08/1934, 87 y.o.   MRN: [id number]  [name] 07/17/2021, 10:52 AM

## 2021-07-18 DIAGNOSIS — J9601 Acute respiratory failure with hypoxia: Secondary | ICD-10-CM | POA: Diagnosis not present

## 2021-07-18 DIAGNOSIS — N3001 Acute cystitis with hematuria: Secondary | ICD-10-CM | POA: Diagnosis not present

## 2021-07-18 DIAGNOSIS — R6520 Severe sepsis without septic shock: Secondary | ICD-10-CM

## 2021-07-18 DIAGNOSIS — A419 Sepsis, unspecified organism: Secondary | ICD-10-CM | POA: Diagnosis not present

## 2021-07-18 DIAGNOSIS — J189 Pneumonia, unspecified organism: Secondary | ICD-10-CM

## 2021-07-18 LAB — BASIC METABOLIC PANEL
Anion gap: 8 (ref 5–15)
BUN: 32 mg/dL — ABNORMAL HIGH (ref 8–23)
CO2: 21 mmol/L — ABNORMAL LOW (ref 22–32)
Calcium: 8.7 mg/dL — ABNORMAL LOW (ref 8.9–10.3)
Chloride: 108 mmol/L (ref 98–111)
Creatinine, Ser: 0.5 mg/dL (ref 0.44–1.00)
GFR, Estimated: >60 mL/min (ref >60)
Glucose, Bld: 199 mg/dL — ABNORMAL HIGH (ref 70–99)
Potassium: 4.5 mmol/L (ref 3.5–5.1)
Sodium: 137 mmol/L (ref 135–145)

## 2021-07-18 LAB — GLUCOSE, CAPILLARY
Glucose-Capillary: 173 mg/dL — ABNORMAL HIGH (ref 70–99)
Glucose-Capillary: 180 mg/dL — ABNORMAL HIGH (ref 70–99)
Glucose-Capillary: 190 mg/dL — ABNORMAL HIGH (ref 70–99)
Glucose-Capillary: 201 mg/dL — ABNORMAL HIGH (ref 70–99)
Glucose-Capillary: 205 mg/dL — ABNORMAL HIGH (ref 70–99)
Glucose-Capillary: 211 mg/dL — ABNORMAL HIGH (ref 70–99)

## 2021-07-18 LAB — CBC
HCT: 25.9 % — ABNORMAL LOW (ref 36.0–46.0)
Hemoglobin: 8 g/dL — ABNORMAL LOW (ref 12.0–15.0)
MCH: 29.1 pg (ref 26.0–34.0)
MCHC: 30.9 g/dL (ref 30.0–36.0)
MCV: 94.2 fL (ref 80.0–100.0)
Platelets: 177 10*3/uL (ref 150–400)
RBC: 2.75 MIL/uL — ABNORMAL LOW (ref 3.87–5.11)
RDW: 13.6 % (ref 11.5–15.5)
WBC: 9 10*3/uL (ref 4.0–10.5)
nRBC: 0 % (ref 0.0–0.2)

## 2021-07-18 MED ORDER — AZITHROMYCIN 500 MG PO TABS: 500.0000 mg | ORAL_TABLET | Freq: Every day | ORAL | 0 refills | Status: AC

## 2021-07-18 MED ORDER — CEFDINIR 300 MG PO CAPS
300.0000 mg | ORAL_CAPSULE | Freq: Two times a day (BID) | ORAL | 0 refills | Status: AC
Start: 2021-07-18 — End: 2021-07-20

## 2021-07-18 NOTE — Progress Notes (Signed)
Inpatient Diabetes Program Recommendations  AACE/ADA: New Consensus Statement on Inpatient Glycemic Control   Target Ranges:  Prepandial:   less than 140 mg/dL      Peak postprandial:   less than 180 mg/dL (1-2 hours)      Critically ill patients:  140 - 180 mg/dL  Results for AYSEL, GILCHREST (MRN [id number]) as of 07/18/2021 10:22  Ref. Range 07/18/2021 00:22 07/18/2021 04:12 07/18/2021 07:32  Glucose-Capillary Latest Ref Range: 70 - 99 mg/dL 205 (H) 211 (H) 180 (H)   Results for ASHLI, SELDERS (MRN [id number]) as of 07/18/2021 10:22  Ref. Range 07/17/2021 08:28 07/17/2021 11:49 07/17/2021 17:50 07/17/2021 21:20  Glucose-Capillary Latest Ref Range: 70 - 99 mg/dL 177 (H) 204 (H) 242 (H) 254 (H)   Review of Glycemic Control   Current orders for Inpatient glycemic control: Novolog 0-15 units Q4H, Novolog 3 units Q4H for TF coverage; Osmolitie @ 50 ml/hr   Inpatient Diabetes Program Recommendations:     Insulin-Tube Feeding: Please consider increasing tube feeding coverage to Novolog 5 units Q4H. If tube feeding is stopped or held then Novolog tube feeding coverage should also be stopped or held.   Thanks, Barnie Alderman, RN, MSN, CDE Diabetes Coordinator Inpatient Diabetes Program [phone] (Team Pager from 8am to 5pm)

## 2021-07-18 NOTE — TOC Progression Note (Addendum)
Transition of Care [facility]) - Progression Note    Patient Details  Name: [name] MRN: [id number] Date of Birth: 17-Sep-1933  Transition of Care [facility]) CM/SW [place], RN Phone Number:[phone]  07/18/2021, 11:53 AM  Clinical Narrative:    CM received notification from [place] stating that patient is active with [facility]. Orders to resume services have been entered. CM has attempted to contact son Florrie Ramires @ [phone] to verify transportation home per MD request. Son does not answer phone. Message has been left. Will await return call.  1444 No return call from son. CM called home # listed in the chart [phone] with no answer. Attempted to call son again Dayzha Pogosyan @ [phone]. No answer  1600 CM attempted to call son with no answer. CM can not determine if patient will need transport due to being unable to reach family and patient is slightly confused.          Expected Discharge Plan and Services           Expected Discharge Date: 07/18/21                                     Social Determinants of Health (SDOH) Interventions    Readmission Risk Interventions No flowsheet data found.

## 2021-07-18 NOTE — Plan of Care (Signed)

## 2021-07-18 NOTE — Discharge Summary (Addendum)
Name: [name] MRN: [id number] DOB: 01-04-1934 87 y.o. PCP: Paulina Fusi, MD  Date of Admission: 07/14/2021  2:45 PM Date of Discharge: 07/18/2021 Attending Physician: Reymundo Poll, MD  Discharge Diagnosis: 1. Acute Hypoxic Respiratory Failure 2. Urinary Tract Infection 3. Chronic dysphagia 4. Hyperglycemia 5. Atrial Fibrillation 6. Hypertension 7. Hypothyroidism  Discharge Medications: Allergies as of 07/18/2021       Reactions   Aspirin Other (See Comments)   Causes blood in urine        Medication List     TAKE these medications    acetaminophen 500 MG tablet Commonly known as: TYLENOL Place 1 tablet (500 mg total) into feeding tube every 6 (six) hours as needed for mild pain, moderate pain or headache. What changed: how much to take   amLODipine 5 MG tablet Commonly known as: NORVASC Place 1 tablet (5 mg total) into feeding tube in the morning and at bedtime. What changed: when to take this   apixaban 5 MG Tabs tablet Commonly known as: Eliquis Place 1 tablet (5 mg total) into feeding tube 2 (two) times daily. What changed: when to take this   azithromycin 500 MG tablet Commonly known as: ZITHROMAX Place 1 tablet (500 mg total) into feeding tube daily for 2 days.   bethanechol 50 MG tablet Commonly known as: URECHOLINE Place 1 tablet (50 mg total) into feeding tube 2 (two) times daily. What changed: when to take this   carvedilol 6.25 MG tablet Commonly known as: COREG Place 1 tablet (6.25 mg total) into feeding tube 2 (two) times daily with a meal. What changed: when to take this   cefdinir 300 MG capsule Commonly known as: OMNICEF Place 1 capsule (300 mg total) into feeding tube 2 (two) times daily for 2 days.   cholecalciferol 1000 units tablet Commonly known as: VITAMIN D Take 1,000 Units by mouth daily.   CITRACAL MAXIMUM PLUS PO Take 2 tablets by mouth daily.   clonazePAM 0.5 MG tablet Commonly known as:  KLONOPIN Place 0.5 tablets (0.25 mg total) into feeding tube in the morning and at bedtime. What changed:  when to take this reasons to take this   Cranberry Urinary Comfort 100-3 MG-UNIT Caps Take 2,000 Units by mouth daily.   doxazosin 4 MG tablet Commonly known as: CARDURA Place 1 tablet (4 mg total) into feeding tube daily.   feeding supplement (PROSource TF) liquid Place 45 mLs into feeding tube daily. What changed: how much to take   folic acid 800 MCG tablet Commonly known as: FOLVITE Place 0.5 tablets (400 mcg total) into feeding tube daily. What changed: how much to take   free water Soln Place 200 mLs into feeding tube every 6 (six) hours. What changed:  how much to take when to take this additional instructions   furosemide 20 MG tablet Commonly known as: LASIX Place 1 tablet (20 mg total) into feeding tube daily.   hydrALAZINE 10 MG tablet Commonly known as: APRESOLINE Place 1 tablet (10 mg total) into feeding tube 3 (three) times daily. What changed: when to take this   iron polysaccharides 150 MG capsule Commonly known as: NIFEREX Take 150 mg by mouth daily.   levothyroxine 50 MCG tablet Commonly known as: SYNTHROID Place 1 tablet (50 mcg total) into feeding tube daily before breakfast.   OVER THE COUNTER MEDICATION Take 1 tablet by mouth daily as needed (constipation). Medication: Prunelax Maxium laxative   QUEtiapine 25 MG tablet Commonly known as:  SEROQUEL Place 0.5 tablets (12.5 mg total) into feeding tube at bedtime. What changed: how much to take   vitamin B-12 1000 MCG tablet Commonly known as: CYANOCOBALAMIN Take 1,000 mcg by mouth daily.   vitamin C 500 MG tablet Commonly known as: ASCORBIC ACID Take 500 mg by mouth daily.   zinc gluconate 50 MG tablet Take 50 mg by mouth daily.        Disposition and follow-up:   [name] was discharged from [facility] in Stable condition.  At the hospital  follow up visit please address:  1.   Acute Hypoxic Respiratory Failure: Patient completed 3 of 5 days of antibiotics for atypical pneumonia and CAP.  Patient was discharged with 2 days of cefdinir and azithromycin.  Please follow-up whether patient has worsening shortness of breath or cough.  Urinary Tract Infection likely due to chronic indwelling Foley: Patient was treated with cefepime 3/3 days.  Please assess urinary status and ensure regular catheter exchange.   Chronic dysphagia: Patient has PEG tube and was given home tube feeding regimen via G-tube.  Hyperglycemia: Patient was put on sliding scale insulin as well as NovoLog 3 units every 4 hours for tube feeding coverage.  Please assess patient's capillary blood glucose for glucose management.  Atrial Fibrillation: Patient was continued on home medication of Eliquis.  Coreg has been held during admission due to low normotensive pressures.  Patient's heart rate have been stable in the 90s during admission.  Hypertension: Patient's hypertensive medications were held in the setting of low normotensive pressures.  Recommended continued home medication upon discharge.  Recommend continuing Lasix 20 mg given continued leg swelling during admission.  Hypothyroidism: Patient was continued on home Synthroid.  2.  Labs / imaging needed at time of follow-up: CBG, A1c  3.  Pending labs/ test needing follow-up: none  Follow-up Appointments: None scheduled this hospitalization. Recommend PCP follow up within the week following hospital discharge.  Hospital Course by problem list:  #Sepsis 2/2 CAUTI #Chronic indwelling Foley 2/2 recurrent UTIs and urinary retention Fulfills 4/4 SIRS criteria with likely source identified.  During admission, patient was febrile 101.4, tachycardic 111, leukocytosis 14, RR 30s and UA suggestive of UTI in patient with chronic indwelling Foley.  Patient's lactic acid normal.  Foley last exchanged 10/24.  In the ED,  patient received 1850 cc fluid bolus and dose of Rocephin.  Patient had a Foley catheter exchange for source control.  Patient's urine culture showed >100,000 Pseudomonal colonies and 30,000 E. Coli colonies.  Patient's blood culture showed no growth for 4 days.  Patient was started on cefepime given previous history of pseudomonal UTI.  Patient completed 3 out of 3 days of cefepime for UTI.  Patient received LR maintenance fluid 100 mL/h for 10 hours.  Patient's bethanechol was held due to it causing increased secretions and chronic dysphagia.   #Suspected viral upper respiratory infection (r/o PE) Patient has 5-day history of worsening congestion, upper respiratory secretions, and cough which prompted her presentation to the ED.  COVID and flu negative. CXR without consolidations to suggest PNA. Patient does have chronic dysphagia with increased upper respiratory secretions which puts her at risk for aspiration PNA/pneumonitis though not seen on CXR on arrival.  Patient's viral respiratory panel was negative.  Given increased shortness of breath, CT angio was performed to assess lung parenchyma as well as rule out pulmonary embolism.  CT angio was negative for acute PE; however, scan did show multilobar respiratory  infection affecting most of the right lung and involving the left upper lobe, suggestive of viral/atypical etiology.  Patient was started on azithromycin which was continued through hospitalization.  Patient was started on 2 additional days of cefdinir for CAP coverage.   #Chronic dysphagia 2/2 hx prior strokes #Severe protein malnutrition #Right MCA stroke 2017 #Right MCA stroke, basal ganglia infarct, with hemorrhagic conversion 2019 PEG tube placed 01/03/2018 during admission for large MCA stroke.  On tube feeds though does take some medications p.o. at home.  Dietitian was consulted for tube feeds and nutritional needs.  Patient was continued on multivitamin, vitamin B12, folic acid, vitamin  D3 supplements   #Mild hyperglycemia in the setting of sepsis BMP with glucose 249 on arrival, in the setting of urosepsis and suspected viral upper respiratory infection.  Patient was placed on sliding scale insulin and CBGs were monitored.   #Atrial fibrillation Patient has history of A. fib for which she takes Eliquis 5 mg per 2 twice daily.  Patient was continued on Eliquis.  Coreg was held in the setting of sepsis with normotensive pressures.   #HTN Holding home antihypertensives (amlodipine 5 mg daily per tube, Coreg 6.25 mg per tube twice daily, hydralazine 10 mg per tube 3 times daily) in the setting of with low normotensive pressures.  Son reports patient not taking doxazosin or furosemide recently.   #Hypothyroidism Last TSH 10/30/2020 normal at 1.4.  Patient was continued on her home Synthroid.  Discharge Exam:   BP 119/82 (BP Location: Right Arm)   Pulse 85   Temp 98.4 F (36.9 C) (Axillary)   Resp 19   Ht 5\' 7"  (1.702 m)   Wt 90.7 kg   SpO2 98%   BMI 31.32 kg/m  Discharge exam:  Physical Exam Constitutional:      General: She is not in acute distress.    Appearance: Normal appearance. She is obese.  HENT:     Nose: No congestion or rhinorrhea.  Cardiovascular:     Rate and Rhythm: Regular rhythm. Tachycardia present.     Pulses: Normal pulses.     Heart sounds: Normal heart sounds. No murmur heard.   No friction rub. No gallop.  Pulmonary:     Effort: Pulmonary effort is normal.     Breath sounds: Normal breath sounds.  Abdominal:     General: Abdomen is flat. Bowel sounds are normal.     Palpations: Abdomen is soft.  Skin:    General: Skin is warm and dry.  Neurological:     General: No focal deficit present.     Mental Status: She is alert and oriented to person, place, and time. Mental status is at baseline.  Psychiatric:        Mood and Affect: Mood normal.        Thought Content: Thought content normal.     Pertinent Labs, Studies, and Procedures:   CT Angio Chest Pulmonary Embolism (PE) W or WO Contrast  Result Date: 07/15/2021 CLINICAL DATA:  87 year old female with sepsis.  Cough. EXAM: CT ANGIOGRAPHY CHEST WITH CONTRAST TECHNIQUE: Multidetector CT imaging of the chest was performed using the standard protocol during bolus administration of intravenous contrast. Multiplanar CT image reconstructions and MIPs were obtained to evaluate the vascular anatomy. CONTRAST:  OMNIPAQUE IOHEXOL 350 MG/ML SOLN COMPARISON:  Portable chest yesterday. [facility] CTA chest 02/23/2010. FINDINGS: Cardiovascular: Good contrast bolus timing in the pulmonary arterial tree. Mild respiratory motion. No central or hilar pulmonary artery filling defect.  Upper and middle lobe branches also appear patent. But bilateral lower lobe branch detail is degraded by motion. Coronary artery atherosclerosis and stent. Borderline cardiomegaly. No pericardial effusion. Negative visible aorta aside from atherosclerosis. Mediastinum/Nodes: Small mediastinal lymph nodes appear reactive. Similar hilar lymph nodes bilaterally. Lungs/Pleura: Atelectatic changes to the major airways, especially at the hila. Mild respiratory motion. Upper lobe tree-in-bud nodular opacity greater on the right and with more confluent peribronchial and peripheral nodular right upper lobe opacity, confluent dependently. There is also patchy peripheral opacity in both segments of the right middle lobe. Superimposed mild atelectasis in both lungs. Trace right pleural effusion. Possible additional mild peribronchial opacity in the right lower lobe although motion artifact there. Upper Abdomen: Negative visible liver, adrenal glands and bowel in the upper abdomen. Dense probable embolization coils at the splenic hilum, although the visible splenic parenchyma is normal. The patient had a history of splenic artery aneurysm. Evidence of pancreatic dystrophic calcifications. Somewhat atrophic appearing visible right  kidney. Musculoskeletal: Degenerative changes in the spine. No acute or suspicious osseous lesion identified. Review of the MIP images confirms the above findings. IMPRESSION: 1. Lower lobe pulmonary artery detail degraded by motion. Otherwise negative for acute PE. 2. Multilobar respiratory infection, affecting most of the right lung and also involving the left upper lobe. Favor viral/atypical etiology. Trace right pleural effusion. Reactive appearing mediastinal and hilar lymph nodes. 3. Coronary artery and Aortic Atherosclerosis (ICD10-I70.0). 4. Partially visible sequelae of splenic artery aneurysm embolization. Electronically Signed   By: Odessa Fleming M.D.   On: 07/15/2021 12:01   DG Chest Port 1 View  Result Date: 07/14/2021 CLINICAL DATA:  Sepsis EXAM: PORTABLE CHEST 1 VIEW COMPARISON:  Chest x-ray 11/08/2020 FINDINGS: Heart is mildly enlarged. Tortuous thoracic aorta with calcified plaques in the arch. Mediastinum appears stable. Mildly prominent interstitial lung markings and biapical pleural thickening similar to previous. No focal consolidation visualized. No significant pleural effusion. No pneumothorax identified. IMPRESSION: Chronic pain changes as described with no focal consolidation identified. Electronically Signed   By: Jannifer Hick M.D.   On: 07/14/2021 15:52    Discharge Instructions: Discharge Instructions     Call MD for:  persistant nausea and vomiting   Complete by: As directed    Call MD for:  severe uncontrolled pain   Complete by: As directed    Call MD for:  temperature >100.4   Complete by: As directed    Diet - low sodium heart healthy   Complete by: As directed    Discharge instructions   Complete by: As directed    Dear Ms. Gut,  Thanks for allowing [place] to take care of you while you were in the hospital. You were admitted for atypical pneumonia and catheter-associated urinary tract infection. We have treated the urinary tract infection with antibiotics while  you were in the hospital. You will require 2 more days of antibiotics after leaving the hospital for your atypical pneumonia.  Please take your medications as prescribed and follow-up with your PCP with regards to your hospitalization as well as management of your blood glucose.    Take care!   Increase activity slowly   Complete by: As directed        Signed: Park Pope, MD 07/18/2021, 10:38 AM   Pager: [phone]

## 2021-07-19 LAB — CULTURE, BLOOD (ROUTINE X 2)
Culture: NO GROWTH
Culture: NO GROWTH
Special Requests: ADEQUATE
Special Requests: ADEQUATE

## 2021-07-19 LAB — GLUCOSE, CAPILLARY
Glucose-Capillary: 214 mg/dL — ABNORMAL HIGH (ref 70–99)
Glucose-Capillary: 203 mg/dL — ABNORMAL HIGH (ref 70–99)

## 2021-07-19 NOTE — Progress Notes (Signed)
Patient help with collecting personal belongings. IV removed with no complaints of pain or discomfort. Family was contacted about patient's discharge and transport to home location. No complaints or concerns stated at this time.

## 2021-07-19 NOTE — TOC Progression Note (Signed)
Transition of Care [facility]) - Progression Note    Patient Details  Name: [name] MRN: [id number] Date of Birth: [birth year]  Transition [facility]) CM/SW [place], RN Phone Number: 07/19/2021, 8:47 AM  Clinical Narrative:     Patient has been discharged since yesterday am. CM attempted to get in touch with son several times yesterday.  I attempted again with no success. I have called for a wellness check.          Expected Discharge Plan and Services           Expected Discharge Date: 07/18/21                                     Social Determinants of Health (SDOH) Interventions    Readmission Risk Interventions No flowsheet data found.

## 2021-07-19 NOTE — TOC Transition Note (Signed)
Transition of Care [facility]) - CM/SW Discharge Note   Patient Details  Name: [name] MRN: [id number] Date of Birth: May 15, 1934  Transition of Care [facility]) CM/SW Contact:  Cyndi Bender, RN Phone Number: 07/19/2021, 9:38 AM   Clinical Narrative:     Patient stable for discharge. Patient is being followed by [facility] RN. Notified Liaison, Ramond Marrow,  of discharge. Spoke to son and he is at the house. PTAR called.  Final next level of care: [place] Barriers to Discharge: Barriers Resolved   Patient Goals and CMS Choice    Return home    Discharge Placement             Home           Discharge Plan and [place] with [place] Determinants of Health (SDOH) Interventions     Readmission Risk Interventions Readmission Risk Prevention Plan 07/19/2021  Transportation Screening Complete  PCP or Specialist Appt within 3-5 Days Complete  HRI or [place] Complete  Social Work Consult for [place] Planning/Counseling Patient refused  Palliative Care Screening Not Applicable  Medication Review [employer]) Complete  Some recent data might be hidden

## 2021-07-20 DIAGNOSIS — R339 Retention of urine, unspecified: Secondary | ICD-10-CM | POA: Diagnosis not present

## 2021-07-20 DIAGNOSIS — F0280 Dementia in other diseases classified elsewhere without behavioral disturbance: Secondary | ICD-10-CM | POA: Diagnosis not present

## 2021-07-20 DIAGNOSIS — I69391 Dysphagia following cerebral infarction: Secondary | ICD-10-CM | POA: Diagnosis not present

## 2021-07-20 DIAGNOSIS — E039 Hypothyroidism, unspecified: Secondary | ICD-10-CM | POA: Diagnosis not present

## 2021-07-20 DIAGNOSIS — I69354 Hemiplegia and hemiparesis following cerebral infarction affecting left non-dominant side: Secondary | ICD-10-CM | POA: Diagnosis not present

## 2021-07-20 DIAGNOSIS — Z466 Encounter for fitting and adjustment of urinary device: Secondary | ICD-10-CM | POA: Diagnosis not present

## 2021-08-10 ENCOUNTER — Emergency Department (HOSPITAL_COMMUNITY)
Admission: EM | Admit: 2021-08-10 | Discharge: 2021-08-11 | Disposition: A | Discharge status: Home / Self Care | Payer: Medicare Other | Attending: Emergency Medicine | Admitting: Emergency Medicine

## 2021-08-10 ENCOUNTER — Other Ambulatory Visit: Payer: Self-pay

## 2021-08-10 ENCOUNTER — Encounter (HOSPITAL_COMMUNITY): Payer: Self-pay

## 2021-08-10 ENCOUNTER — Emergency Department (HOSPITAL_COMMUNITY): Payer: Medicare Other

## 2021-08-10 DIAGNOSIS — E039 Hypothyroidism, unspecified: Secondary | ICD-10-CM | POA: Insufficient documentation

## 2021-08-10 DIAGNOSIS — J189 Pneumonia, unspecified organism: Secondary | ICD-10-CM | POA: Diagnosis not present

## 2021-08-10 DIAGNOSIS — M1990 Unspecified osteoarthritis, unspecified site: Secondary | ICD-10-CM | POA: Diagnosis not present

## 2021-08-10 DIAGNOSIS — K9423 Gastrostomy malfunction: Secondary | ICD-10-CM | POA: Insufficient documentation

## 2021-08-10 DIAGNOSIS — I10 Essential (primary) hypertension: Secondary | ICD-10-CM | POA: Insufficient documentation

## 2021-08-10 DIAGNOSIS — Z9981 Dependence on supplemental oxygen: Secondary | ICD-10-CM | POA: Diagnosis not present

## 2021-08-10 DIAGNOSIS — J181 Lobar pneumonia, unspecified organism: Secondary | ICD-10-CM | POA: Insufficient documentation

## 2021-08-10 DIAGNOSIS — Z9181 History of falling: Secondary | ICD-10-CM | POA: Diagnosis not present

## 2021-08-10 DIAGNOSIS — J9600 Acute respiratory failure, unspecified whether with hypoxia or hypercapnia: Secondary | ICD-10-CM | POA: Diagnosis not present

## 2021-08-10 DIAGNOSIS — R5381 Other malaise: Secondary | ICD-10-CM | POA: Diagnosis not present

## 2021-08-10 DIAGNOSIS — Z431 Encounter for attention to gastrostomy: Secondary | ICD-10-CM | POA: Diagnosis not present

## 2021-08-10 DIAGNOSIS — Z96653 Presence of artificial knee joint, bilateral: Secondary | ICD-10-CM | POA: Insufficient documentation

## 2021-08-10 DIAGNOSIS — Z7901 Long term (current) use of anticoagulants: Secondary | ICD-10-CM | POA: Insufficient documentation

## 2021-08-10 DIAGNOSIS — E7800 Pure hypercholesterolemia, unspecified: Secondary | ICD-10-CM | POA: Diagnosis not present

## 2021-08-10 DIAGNOSIS — T83511D Infection and inflammatory reaction due to indwelling urethral catheter, subsequent encounter: Secondary | ICD-10-CM | POA: Diagnosis not present

## 2021-08-10 DIAGNOSIS — Z79899 Other long term (current) drug therapy: Secondary | ICD-10-CM | POA: Diagnosis not present

## 2021-08-10 DIAGNOSIS — R627 Adult failure to thrive: Secondary | ICD-10-CM | POA: Diagnosis not present

## 2021-08-10 DIAGNOSIS — R000 Tachycardia, unspecified: Secondary | ICD-10-CM | POA: Diagnosis not present

## 2021-08-10 DIAGNOSIS — E871 Hypo-osmolality and hyponatremia: Secondary | ICD-10-CM | POA: Diagnosis not present

## 2021-08-10 DIAGNOSIS — R059 Cough, unspecified: Secondary | ICD-10-CM | POA: Diagnosis not present

## 2021-08-10 DIAGNOSIS — I69354 Hemiplegia and hemiparesis following cerebral infarction affecting left non-dominant side: Secondary | ICD-10-CM | POA: Diagnosis not present

## 2021-08-10 DIAGNOSIS — R339 Retention of urine, unspecified: Secondary | ICD-10-CM | POA: Diagnosis not present

## 2021-08-10 DIAGNOSIS — E43 Unspecified severe protein-calorie malnutrition: Secondary | ICD-10-CM | POA: Diagnosis not present

## 2021-08-10 DIAGNOSIS — N390 Urinary tract infection, site not specified: Secondary | ICD-10-CM | POA: Diagnosis not present

## 2021-08-10 DIAGNOSIS — R9431 Abnormal electrocardiogram [ECG] [EKG]: Secondary | ICD-10-CM | POA: Diagnosis not present

## 2021-08-10 DIAGNOSIS — J9601 Acute respiratory failure with hypoxia: Secondary | ICD-10-CM | POA: Diagnosis not present

## 2021-08-10 DIAGNOSIS — F0280 Dementia in other diseases classified elsewhere without behavioral disturbance: Secondary | ICD-10-CM | POA: Diagnosis not present

## 2021-08-10 DIAGNOSIS — R0902 Hypoxemia: Secondary | ICD-10-CM | POA: Diagnosis not present

## 2021-08-10 DIAGNOSIS — Z4682 Encounter for fitting and adjustment of non-vascular catheter: Secondary | ICD-10-CM | POA: Diagnosis not present

## 2021-08-10 DIAGNOSIS — Z7401 Bed confinement status: Secondary | ICD-10-CM | POA: Diagnosis not present

## 2021-08-10 DIAGNOSIS — I69391 Dysphagia following cerebral infarction: Secondary | ICD-10-CM | POA: Diagnosis not present

## 2021-08-10 DIAGNOSIS — D649 Anemia, unspecified: Secondary | ICD-10-CM | POA: Diagnosis not present

## 2021-08-10 DIAGNOSIS — I4891 Unspecified atrial fibrillation: Secondary | ICD-10-CM | POA: Diagnosis not present

## 2021-08-10 MED ORDER — AZITHROMYCIN 200 MG/5ML PO SUSR: 500.0000 mg | Freq: Every day | ORAL | 0 refills | Status: AC

## 2021-08-10 MED ORDER — DIATRIZOATE MEGLUMINE & SODIUM 66-10 % PO SOLN
30.0000 mL | Freq: Once | ORAL | Status: AC
Start: 2021-08-10 — End: 2021-08-10
  Administered 2021-08-10: 30 mL

## 2021-08-10 MED ORDER — CEFDINIR 250 MG/5ML PO SUSR: 300.0000 mg | Freq: Two times a day (BID) | ORAL | 0 refills | Status: AC

## 2021-08-10 NOTE — ED Notes (Signed)
Pt. Soiled bed pad with small, brown bm. Pt. Cleaned up with peri-cleanse on washcloth and dryed with 3 assist. Pt. New chucks applied.

## 2021-08-10 NOTE — ED Notes (Signed)
PTAR contacted for transport home. Son in room informed of discharge instructions

## 2021-08-10 NOTE — ED Provider Notes (Signed)
[facility] DEPT Provider Note   CSN: [id number] Arrival date & time: 08/10/21  1839     History Chief Complaint  Patient presents with   Displaced G tube    [name] is a 87 y.o. female.  She is brought in from home by EMS for evaluation of a displaced feeding tube.  She has a chronic G-tube secondary to dysphagia.  History of strokes.  Also has a chronic indwelling Foley.  Patient denies any complaints.  She does not know why she is here.  She was admitted in early November for UTI and upper respiratory infection.  She is bedbound at baseline.  The history is provided by the patient and the EMS personnel.  GI Problem This is a new problem. The current episode started 6 to 12 hours ago. The problem has not changed since onset.Pertinent negatives include no chest pain, no abdominal pain, no headaches and no shortness of breath. Nothing aggravates the symptoms. Nothing relieves the symptoms. She has tried nothing for the symptoms. The treatment provided no relief.      Past Medical History:  Diagnosis Date   Anemia    Arthritis    "was in my knees"   High cholesterol    History of blood transfusion    "S/P knee OR"   Hypertension    Hypothyroidism    Pneumonia    "maybe in my teens"   Recurrent UTI (urinary tract infection)    Stroke ([place]) 1990's   "mild", denies residual on 01/28/2014    Patient Active Problem List   Diagnosis Date Noted   Pneumonia of both lungs due to infectious organism    Acute cystitis with hematuria    Sepsis ([place]) 07/14/2021   Acute hypoxemic respiratory failure ([place]) 07/14/2021   Hypotension    Hyperkalemia    Pressure injury of skin 11/04/2020   Protein-calorie malnutrition, severe 11/04/2020   Hyponatremia 11/03/2020   Acute ischemic right MCA stroke ([place]) 01/23/2016   Gait disturbance, post-stroke    Hemiparesis ([place])    Essential hypertension    Dyslipidemia    Thyroid activity decreased     E-coli UTI    Dyspepsia    Dehydration    Acute blood loss anemia    Intracranial vascular stenosis    Ischemic stroke ([place]) 01/19/2016   Recurrent UTI 01/19/2016   HLD (hyperlipidemia) 01/19/2016   Intertrochanteric fracture of right hip ([place]) 03/23/2014   HTN (hypertension) 03/23/2014   Hypothyroidism 03/23/2014   Anemia 03/23/2014   Thrombocytopenia, unspecified ([place]) 03/23/2014   Closed right hip fracture ([place]) 03/23/2014   Pseudoaneurysm of right femoral artery ([place]) 01/31/2014   Pseudoaneurysm of femoral artery ([place]) 01/30/2014   Splenic artery aneurysm ([place]) 12/04/2013    Past Surgical History:  Procedure Laterality Date   BLADDER SUSPENSION  2012   EMBOLIZATION N/A 01/28/2014   Procedure: EMBOLIZATION;  Surgeon: Conrad Lapeer, MD;  Location: [facility] CATH LAB;  Service: Cardiovascular;  Laterality: N/A;  Splenic Artery   FEMUR IM NAIL Right 03/23/2014   Procedure: INTRAMEDULLARY (IM) NAIL FEMORAL;  Surgeon: Mauri Pole, MD;  Location: [facility] ORS;  Service: Orthopedics;  Laterality: Right;   IR GJ TUBE CHANGE  11/09/2020   JOINT REPLACEMENT     MUSCLE BIOPSY Left 1990's   "knot biopsy from carrying mail bag for years"   REVISON OF ARTERIOVENOUS FISTULA Right 01/31/2014   Procedure: REPAIR PSEUDOANEURYSM;  Surgeon: Serafina Mitchell, MD;  Location: [facility];  Service: Vascular;  Laterality: Right;  Repair of Femoral Pseudoaneurysm.   SPLENIC ARTERY EMBOLIZATION  01/28/2014   TOTAL KNEE ARTHROPLASTY Bilateral 2012   VAGINAL HYSTERECTOMY     VISCERAL ANGIOGRAM N/A 01/28/2014   Procedure: VISCERAL ANGIOGRAM;  Surgeon: Conrad [place], MD;  Location[facility] CATH LAB;  Service: Cardiovascular;  Laterality: N/A;     OB History   No obstetric history on file.     Family History  Problem Relation Age of Onset   Hypertension Son    Heart attack Neg Hx     Social History   Tobacco Use   Smoking status: Never   Smokeless tobacco: Never  Substance Use Topics   Alcohol use: No    Alcohol/week:  0.0 standard drinks   Drug use: No    Home Medications Prior to Admission medications   Medication Sig Start Date End Date Taking? Authorizing Provider  acetaminophen (TYLENOL) 500 MG tablet Place 1 tablet (500 mg total) into feeding tube every 6 (six) hours as needed for mild pain, moderate pain or headache. Patient taking differently: Place 1,000 mg into feeding tube every 6 (six) hours as needed for mild pain, moderate pain or headache. 11/12/20   Domenic Polite, MD  amLODipine (NORVASC) 5 MG tablet Place 1 tablet (5 mg total) into feeding tube in the morning and at bedtime. Patient taking differently: Place 5 mg into feeding tube daily. 11/12/20   Domenic Polite, MD  apixaban (ELIQUIS) 5 MG TABS tablet Place 1 tablet (5 mg total) into feeding tube 2 (two) times daily. Patient taking differently: Place 5 mg into feeding tube daily. 11/12/20   Domenic Polite, MD  bethanechol (URECHOLINE) 50 MG tablet Place 1 tablet (50 mg total) into feeding tube 2 (two) times daily. Patient taking differently: Place 50 mg into feeding tube daily. 11/12/20   Domenic Polite, MD  carvedilol (COREG) 6.25 MG tablet Place 1 tablet (6.25 mg total) into feeding tube 2 (two) times daily with a meal. Patient taking differently: Place 6.25 mg into feeding tube daily. 11/12/20   Domenic Polite, MD  cholecalciferol (VITAMIN D) 1000 UNITS tablet Take 1,000 Units by mouth daily.    [provider]  clonazePAM (KLONOPIN) 0.5 MG tablet Place 0.5 tablets (0.25 mg total) into feeding tube in the morning and at bedtime. Patient taking differently: Place 0.25 mg into feeding tube 2 (two) times daily as needed for anxiety. 11/12/20   Domenic Polite, MD  doxazosin (CARDURA) 4 MG tablet Place 1 tablet (4 mg total) into feeding tube daily. Patient not taking: No sig reported 11/12/20   Domenic Polite, MD  folic acid (FOLVITE) 263 MCG tablet Place 0.5 tablets (400 mcg total) into feeding tube daily. Patient taking differently:  Place 800 mcg into feeding tube daily. 11/12/20   Domenic Polite, MD  furosemide (LASIX) 20 MG tablet Place 1 tablet (20 mg total) into feeding tube daily. Patient not taking: No sig reported 11/13/20   Domenic Polite, MD  hydrALAZINE (APRESOLINE) 10 MG tablet Place 1 tablet (10 mg total) into feeding tube 3 (three) times daily. Patient taking differently: Place 10 mg into feeding tube daily. 11/12/20   Domenic Polite, MD  iron polysaccharides (NIFEREX) 150 MG capsule Take 150 mg by mouth daily.    [provider]  levothyroxine (SYNTHROID) 50 MCG tablet Place 1 tablet (50 mcg total) into feeding tube daily before breakfast. 11/12/20   Domenic Polite, MD  Multiple Minerals-Vitamins (CITRACAL MAXIMUM PLUS PO) Take 2 tablets by  mouth daily.    [provider]  Nutritional Supplements (FEEDING SUPPLEMENT, PROSOURCE TF,) liquid Place 45 mLs into feeding tube daily. Patient taking differently: Place 60 mLs into feeding tube daily. 11/13/20   Domenic Polite, MD  OVER THE COUNTER MEDICATION Take 1 tablet by mouth daily as needed (constipation). Medication: Prunelax Maxium laxative    [provider]  QUEtiapine (SEROQUEL) 25 MG tablet Place 0.5 tablets (12.5 mg total) into feeding tube at bedtime. Patient taking differently: Place 25 mg into feeding tube at bedtime. 11/12/20   Domenic Polite, MD  vitamin B-12 (CYANOCOBALAMIN) 1000 MCG tablet Take 1,000 mcg by mouth daily.    [provider]  vitamin C (ASCORBIC ACID) 500 MG tablet Take 500 mg by mouth daily.    [provider]  Vitamins C E (CRANBERRY URINARY COMFORT) 100-3 MG-UNIT CAPS Take 2,000 Units by mouth daily.    [provider]  Water For Irrigation, Sterile (FREE WATER) SOLN Place 200 mLs into feeding tube every 6 (six) hours. Patient taking differently: Place 100 mLs into feeding tube See admin instructions. Every 3 hours 11/12/20   Domenic Polite, MD  zinc gluconate 50 MG tablet Take 50 mg by  mouth daily.    [provider]    Allergies    Aspirin  Review of Systems   Review of Systems  Constitutional:  Negative for fever.  HENT:  Negative for sore throat.   Eyes:  Negative for visual disturbance.  Respiratory:  Negative for shortness of breath.   Cardiovascular:  Negative for chest pain.  Gastrointestinal:  Negative for abdominal pain.  Genitourinary:  Negative for dysuria.  Musculoskeletal:  Negative for neck pain.  Skin:  Negative for rash.  Neurological:  Negative for headaches.   Physical Exam Updated Vital Signs BP 132/89   Pulse 68   Temp 98.7 F (37.1 C) (Axillary)   Resp 18   SpO2 99%   Physical Exam Vitals and nursing note reviewed.  Constitutional:      General: She is not in acute distress.    Appearance: Normal appearance. She is well-developed.  HENT:     Head: Normocephalic and atraumatic.  Eyes:     Conjunctiva/sclera: Conjunctivae normal.  Cardiovascular:     Rate and Rhythm: Normal rate. Rhythm irregular.     Heart sounds: No murmur heard. Pulmonary:     Effort: Pulmonary effort is normal. No respiratory distress.     Breath sounds: Normal breath sounds.  Abdominal:     Palpations: Abdomen is soft.     Tenderness: There is no abdominal tenderness. There is no guarding or rebound.  Musculoskeletal:        General: No swelling.     Cervical back: Neck supple.  Skin:    General: Skin is warm and dry.     Capillary Refill: Capillary refill takes less than 2 seconds.  Neurological:     Mental Status: She is alert. Mental status is at baseline.  Psychiatric:        Mood and Affect: Mood normal.    ED Results / Procedures / Treatments   Labs (all labs ordered are listed, but only abnormal results are displayed) Labs Reviewed - No data to display  EKG EKG Interpretation  Date/Time:  Thursday August 10 2021 19:17:26 EST Ventricular Rate:  101 PR Interval:  108 QRS Duration: 105 QT Interval:  378 QTC  Calculation: 490 R Axis:   -31 Text Interpretation: Sinus tachycardia with irregular rate  vs afib Left axis deviation Low voltage, precordial leads Borderline prolonged QT interval Confirmed by Aletta Edouard [id number]) on 08/10/2021 7:25:31 PM  Radiology DG Chest 1 View  Result Date: 08/10/2021 CLINICAL DATA:  Cough. EXAM: CHEST  1 VIEW COMPARISON:  Chest x-ray 07/14/2021. chest CT 07/15/2021. FINDINGS: The aorta is tortuous. The heart is mildly enlarged, unchanged. There are patchy airspace opacities in the left lung base. Chronic appearing interstitial opacities are again seen in the right upper lobe similar to the prior study. There is no pneumothorax or acute fracture. IMPRESSION: 1. Patchy opacities in the left lower lobe worrisome for infection. Followup PA and lateral chest X-ray is recommended in 3-4 weeks following trial of antibiotic therapy to ensure resolution and exclude underlying malignancy. Electronically Signed   By: Ronney Asters M.D.   On: 08/10/2021 20:47   DG ABDOMEN PEG TUBE LOCATION  Result Date: 08/10/2021 CLINICAL DATA:  Displaced G-tube. EXAM: ABDOMEN - 1 VIEW COMPARISON:  None. FINDINGS: Contrast injection into the G-tube demonstrates contrast within the stomach and less so proximal small bowel. No bowel obstruction. Embolization coils in the left upper quadrant. No contrast extravasation IMPRESSION: Gastrostomy tube positioned within the gastric body. Electronically Signed   By: Abigail Miyamoto M.D.   On: 08/10/2021 20:48    Procedures Gastrostomy tube replacement  Date/Time: 08/10/2021 7:44 PM Performed by: Hayden Rasmussen, MD Authorized by: Hayden Rasmussen, MD  Consent: Verbal consent obtained. Risks and benefits: risks, benefits and alternatives were discussed Consent given by: patient Patient understanding: patient states understanding of the procedure being performed Preparation: Patient was prepped and draped in the usual sterile fashion. Local anesthesia used:  no  Anesthesia: Local anesthesia used: no  Sedation: Patient sedated: no  Patient tolerance: patient tolerated the procedure well with no immediate complications Comments: Unable to get 47 [place].  Replaced with 16 [place].  X-ray ordered to confirm placement     Medications Ordered in ED Medications  diatrizoate meglumine-sodium (GASTROGRAFIN) 66-10 % solution 30 mL (30 mLs Per Tube Given 08/10/21 2035)    ED Course  I have reviewed the triage vital signs and the nursing notes.  Pertinent labs & imaging results that were available during my care of the patient were reviewed by me and considered in my medical decision making (see chart for details).  Clinical Course as of 08/11/21 1037  Thu Aug 10, 2021  2032 Patient's son is here now and he states he did not have any other concerns about her other than that she is probably due to have her Foley catheter changed. [MB]  2050 Chest x-ray ordered and interpreted by me as possible left lower lobe infiltrate versus atelectasis. [MB]  2051 G-tube in good position [MB]    Clinical Course User Index [MB] Hayden Rasmussen, MD   MDM Rules/Calculators/A&P                           Patient here from home for evaluation of displaced G-tube.  Her care is primarily provided by her son.  G-tube replaced manually.  X-ray confirms good positioning.  Also ordered chest x-ray due to some transient hypoxia here.  Radiology says possible left lower lobe pneumonia.  Patient had pneumonia earlier this month.  Will be started antibiotics.  No hypoxia currently.  Patient in no distress.  Patient's son states she has had minimal cough and no respiratory distress.  He is comfortable with her returning home.  She will be be transported back home via [facility].  Return instructions reviewed with son.  Final Clinical Impression(s) / ED Diagnoses Final diagnoses:  PEG (percutaneous endoscopic gastrostomy) adjustment/replacement/removal ([place])  Pneumonia of left  lower lobe due to infectious organism    Rx / DC Orders ED Discharge Orders          Ordered    cefdinir (OMNICEF) 250 MG/5ML suspension  2 times daily        08/10/21 2244    azithromycin (ZITHROMAX) 200 MG/5ML suspension  Daily        08/10/21 2244             Hayden Rasmussen, MD 08/11/21 1039

## 2021-08-10 NOTE — Discharge Instructions (Signed)
You were seen in the emergency department for evaluation of a feeding tube that had come out.  This was replaced.  You also had a chest x-ray that showed a possible early pneumonia.  We are prescribing some antibiotics.  Please follow-up with your primary care doctor.  Return to the emergency department if any worsening or concerning symptoms

## 2021-08-10 NOTE — ED Triage Notes (Signed)
Patient BIB [facility] from home for displaced  G- tube. Patient does have home health however they were unable to bring a new G-tube for several days. Family requested patient be transported to ED.

## 2021-08-10 NOTE — ED Notes (Signed)
Son requested for Foley Cath to be changed out

## 2021-08-11 DIAGNOSIS — R531 Weakness: Secondary | ICD-10-CM | POA: Diagnosis not present

## 2021-08-11 DIAGNOSIS — Z7401 Bed confinement status: Secondary | ICD-10-CM | POA: Diagnosis not present

## 2021-08-23 DIAGNOSIS — I69354 Hemiplegia and hemiparesis following cerebral infarction affecting left non-dominant side: Secondary | ICD-10-CM | POA: Diagnosis not present

## 2021-08-23 DIAGNOSIS — T83511D Infection and inflammatory reaction due to indwelling urethral catheter, subsequent encounter: Secondary | ICD-10-CM | POA: Diagnosis not present

## 2021-08-23 DIAGNOSIS — N390 Urinary tract infection, site not specified: Secondary | ICD-10-CM | POA: Diagnosis not present

## 2021-08-23 DIAGNOSIS — R339 Retention of urine, unspecified: Secondary | ICD-10-CM | POA: Diagnosis not present

## 2021-08-23 DIAGNOSIS — J181 Lobar pneumonia, unspecified organism: Secondary | ICD-10-CM | POA: Diagnosis not present

## 2021-08-23 DIAGNOSIS — J9601 Acute respiratory failure with hypoxia: Secondary | ICD-10-CM | POA: Diagnosis not present

## 2021-09-08 DIAGNOSIS — I69354 Hemiplegia and hemiparesis following cerebral infarction affecting left non-dominant side: Secondary | ICD-10-CM | POA: Diagnosis not present

## 2021-09-08 DIAGNOSIS — J181 Lobar pneumonia, unspecified organism: Secondary | ICD-10-CM | POA: Diagnosis not present

## 2021-09-08 DIAGNOSIS — T83511D Infection and inflammatory reaction due to indwelling urethral catheter, subsequent encounter: Secondary | ICD-10-CM | POA: Diagnosis not present

## 2021-09-08 DIAGNOSIS — R339 Retention of urine, unspecified: Secondary | ICD-10-CM | POA: Diagnosis not present

## 2021-09-08 DIAGNOSIS — N390 Urinary tract infection, site not specified: Secondary | ICD-10-CM | POA: Diagnosis not present

## 2021-09-08 DIAGNOSIS — J9601 Acute respiratory failure with hypoxia: Secondary | ICD-10-CM | POA: Diagnosis not present

## 2021-09-09 DIAGNOSIS — E871 Hypo-osmolality and hyponatremia: Secondary | ICD-10-CM | POA: Diagnosis not present

## 2021-09-09 DIAGNOSIS — I4891 Unspecified atrial fibrillation: Secondary | ICD-10-CM | POA: Diagnosis not present

## 2021-09-09 DIAGNOSIS — E039 Hypothyroidism, unspecified: Secondary | ICD-10-CM | POA: Diagnosis not present

## 2021-09-09 DIAGNOSIS — D649 Anemia, unspecified: Secondary | ICD-10-CM | POA: Diagnosis not present

## 2021-09-09 DIAGNOSIS — Z9981 Dependence on supplemental oxygen: Secondary | ICD-10-CM | POA: Diagnosis not present

## 2021-09-09 DIAGNOSIS — E7800 Pure hypercholesterolemia, unspecified: Secondary | ICD-10-CM | POA: Diagnosis not present

## 2021-09-09 DIAGNOSIS — I69354 Hemiplegia and hemiparesis following cerebral infarction affecting left non-dominant side: Secondary | ICD-10-CM | POA: Diagnosis not present

## 2021-09-09 DIAGNOSIS — Z79899 Other long term (current) drug therapy: Secondary | ICD-10-CM | POA: Diagnosis not present

## 2021-09-09 DIAGNOSIS — I10 Essential (primary) hypertension: Secondary | ICD-10-CM | POA: Diagnosis not present

## 2021-09-09 DIAGNOSIS — Z7401 Bed confinement status: Secondary | ICD-10-CM | POA: Diagnosis not present

## 2021-09-09 DIAGNOSIS — M1990 Unspecified osteoarthritis, unspecified site: Secondary | ICD-10-CM | POA: Diagnosis not present

## 2021-09-09 DIAGNOSIS — E43 Unspecified severe protein-calorie malnutrition: Secondary | ICD-10-CM | POA: Diagnosis not present

## 2021-09-09 DIAGNOSIS — R339 Retention of urine, unspecified: Secondary | ICD-10-CM | POA: Diagnosis not present

## 2021-09-09 DIAGNOSIS — F0280 Dementia in other diseases classified elsewhere without behavioral disturbance: Secondary | ICD-10-CM | POA: Diagnosis not present

## 2021-09-09 DIAGNOSIS — Z466 Encounter for fitting and adjustment of urinary device: Secondary | ICD-10-CM | POA: Diagnosis not present

## 2021-09-09 DIAGNOSIS — Z9181 History of falling: Secondary | ICD-10-CM | POA: Diagnosis not present

## 2021-09-09 DIAGNOSIS — Z431 Encounter for attention to gastrostomy: Secondary | ICD-10-CM | POA: Diagnosis not present

## 2021-09-09 DIAGNOSIS — Z7951 Long term (current) use of inhaled steroids: Secondary | ICD-10-CM | POA: Diagnosis not present

## 2021-09-09 DIAGNOSIS — Z7901 Long term (current) use of anticoagulants: Secondary | ICD-10-CM | POA: Diagnosis not present

## 2021-09-09 DIAGNOSIS — I69391 Dysphagia following cerebral infarction: Secondary | ICD-10-CM | POA: Diagnosis not present

## 2021-09-09 DIAGNOSIS — R627 Adult failure to thrive: Secondary | ICD-10-CM | POA: Diagnosis not present

## 2021-09-16 DIAGNOSIS — Z4682 Encounter for fitting and adjustment of non-vascular catheter: Secondary | ICD-10-CM | POA: Diagnosis not present

## 2021-09-16 DIAGNOSIS — Z7401 Bed confinement status: Secondary | ICD-10-CM | POA: Diagnosis not present

## 2021-09-16 DIAGNOSIS — R69 Illness, unspecified: Secondary | ICD-10-CM | POA: Diagnosis not present

## 2021-09-16 DIAGNOSIS — R5381 Other malaise: Secondary | ICD-10-CM | POA: Diagnosis not present

## 2021-09-16 DIAGNOSIS — K9423 Gastrostomy malfunction: Secondary | ICD-10-CM | POA: Diagnosis not present

## 2021-09-16 DIAGNOSIS — Z743 Need for continuous supervision: Secondary | ICD-10-CM | POA: Diagnosis not present

## 2021-09-16 DIAGNOSIS — R279 Unspecified lack of coordination: Secondary | ICD-10-CM | POA: Diagnosis not present

## 2021-09-18 DIAGNOSIS — R339 Retention of urine, unspecified: Secondary | ICD-10-CM | POA: Diagnosis not present

## 2021-09-18 DIAGNOSIS — Z466 Encounter for fitting and adjustment of urinary device: Secondary | ICD-10-CM | POA: Diagnosis not present

## 2021-09-18 DIAGNOSIS — I10 Essential (primary) hypertension: Secondary | ICD-10-CM | POA: Diagnosis not present

## 2021-09-18 DIAGNOSIS — E039 Hypothyroidism, unspecified: Secondary | ICD-10-CM | POA: Diagnosis not present

## 2021-09-18 DIAGNOSIS — I69354 Hemiplegia and hemiparesis following cerebral infarction affecting left non-dominant side: Secondary | ICD-10-CM | POA: Diagnosis not present

## 2021-09-18 DIAGNOSIS — I69391 Dysphagia following cerebral infarction: Secondary | ICD-10-CM | POA: Diagnosis not present

## 2021-09-25 DIAGNOSIS — I69391 Dysphagia following cerebral infarction: Secondary | ICD-10-CM | POA: Diagnosis not present

## 2021-09-25 DIAGNOSIS — E039 Hypothyroidism, unspecified: Secondary | ICD-10-CM | POA: Diagnosis not present

## 2021-09-25 DIAGNOSIS — R339 Retention of urine, unspecified: Secondary | ICD-10-CM | POA: Diagnosis not present

## 2021-09-25 DIAGNOSIS — I10 Essential (primary) hypertension: Secondary | ICD-10-CM | POA: Diagnosis not present

## 2021-09-25 DIAGNOSIS — I69354 Hemiplegia and hemiparesis following cerebral infarction affecting left non-dominant side: Secondary | ICD-10-CM | POA: Diagnosis not present

## 2021-09-25 DIAGNOSIS — Z466 Encounter for fitting and adjustment of urinary device: Secondary | ICD-10-CM | POA: Diagnosis not present

## 2021-10-06 DIAGNOSIS — I69354 Hemiplegia and hemiparesis following cerebral infarction affecting left non-dominant side: Secondary | ICD-10-CM | POA: Diagnosis not present

## 2021-10-06 DIAGNOSIS — Z466 Encounter for fitting and adjustment of urinary device: Secondary | ICD-10-CM | POA: Diagnosis not present

## 2021-10-06 DIAGNOSIS — I10 Essential (primary) hypertension: Secondary | ICD-10-CM | POA: Diagnosis not present

## 2021-10-06 DIAGNOSIS — R339 Retention of urine, unspecified: Secondary | ICD-10-CM | POA: Diagnosis not present

## 2021-10-06 DIAGNOSIS — E039 Hypothyroidism, unspecified: Secondary | ICD-10-CM | POA: Diagnosis not present

## 2021-10-06 DIAGNOSIS — I69391 Dysphagia following cerebral infarction: Secondary | ICD-10-CM | POA: Diagnosis not present

## 2021-10-11 DEATH — deceased

## 2022-08-03 IMAGING — CT CT ANGIO CHEST
2 of 7 series · 17 of 46 positions shown · IV contrast (APPLIED)
Comparison: Portable chest yesterday. [facility] An[facility]or CTA chest
02/23/2010.

CLINICAL DATA: 87-year-old female with sepsis.  Cough.

EXAM:
CT ANGIOGRAPHY CHEST WITH CONTRAST
TECHNIQUE: Multidetector CT imaging of the chest was performed using the
standard protocol during bolus administration of intravenous
contrast. Multiplanar CT image reconstructions and MIPs were
obtained to evaluate the vascular anatomy.
CONTRAST:  100mL OMNIPAQUE IOHEXOL 350 MG/ML SOLN

[Series 9: thins · axial · 0.67mm/px · z∈[+1185,+1471]mm · 14 of 460 slices shown]
[im 26/460  lung]
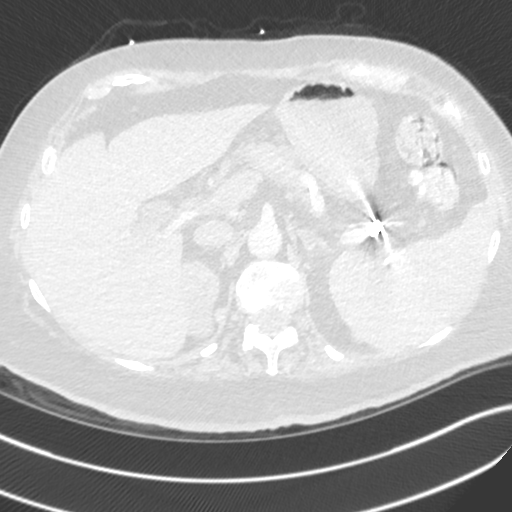
[im 52/460  soft-tissue]
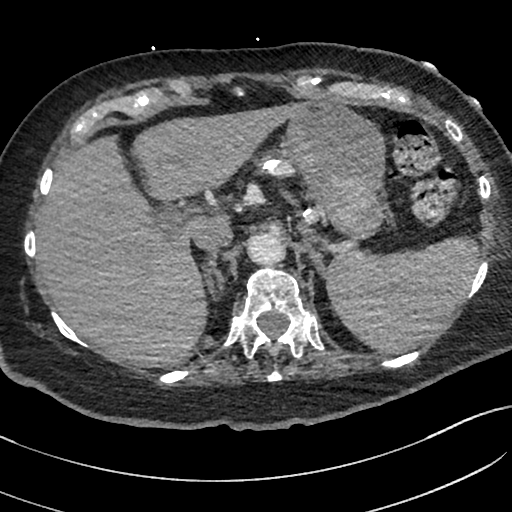
[im 103/460  lung]
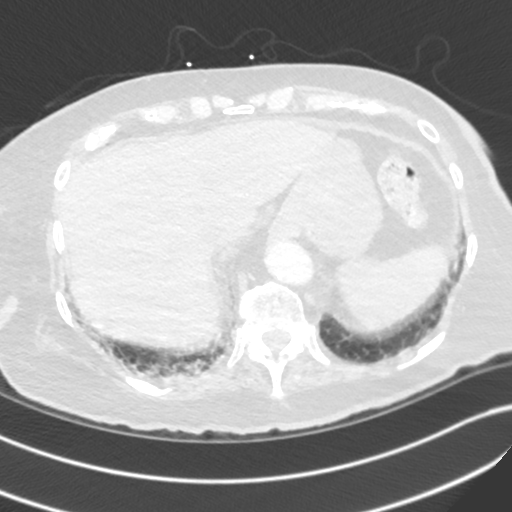
[im 128/460  soft-tissue]
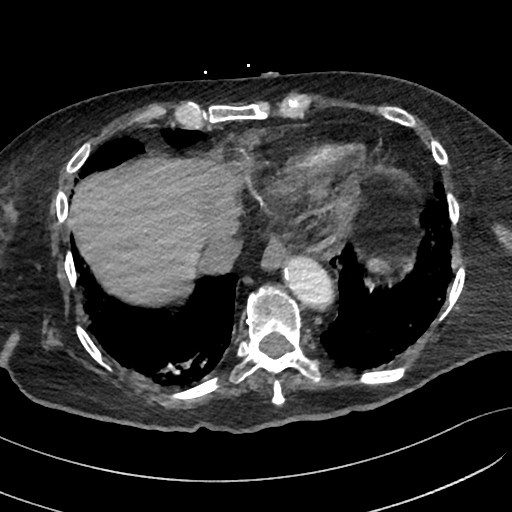
[im 154/460  lung]
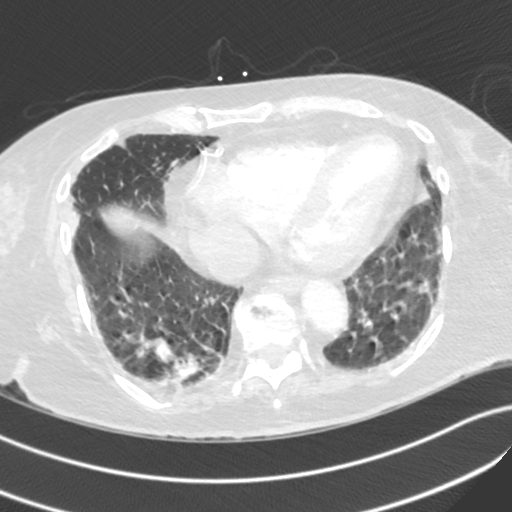
[im 179/460  soft-tissue]
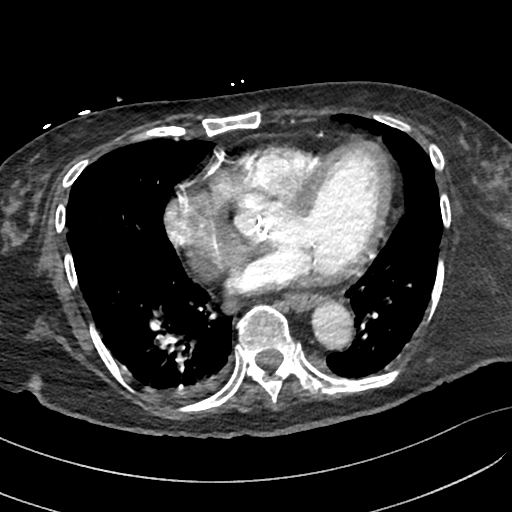
[im 205/460  lung]
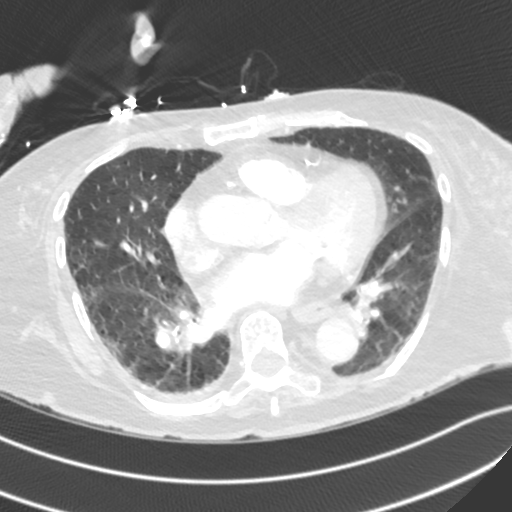
[im 256/460  soft-tissue]
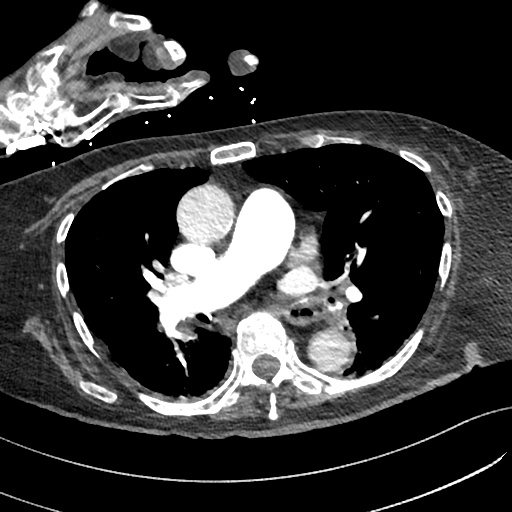
[im 281/460  lung]
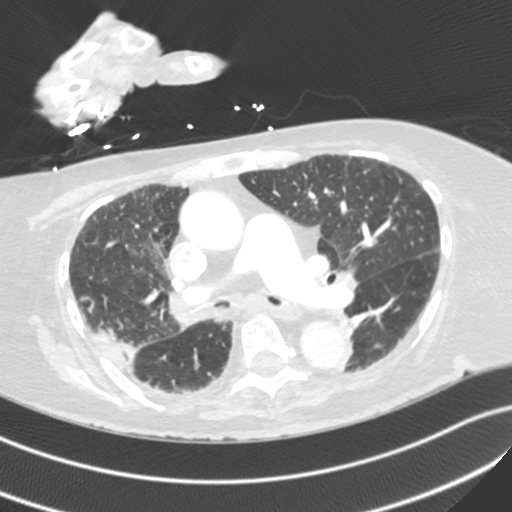
[im 307/460  soft-tissue]
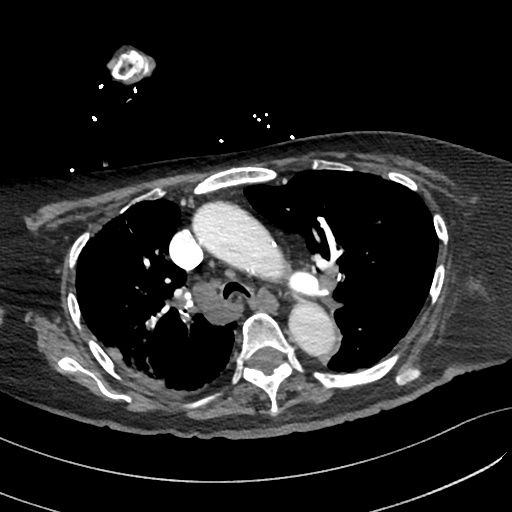
[im 332/460  lung]
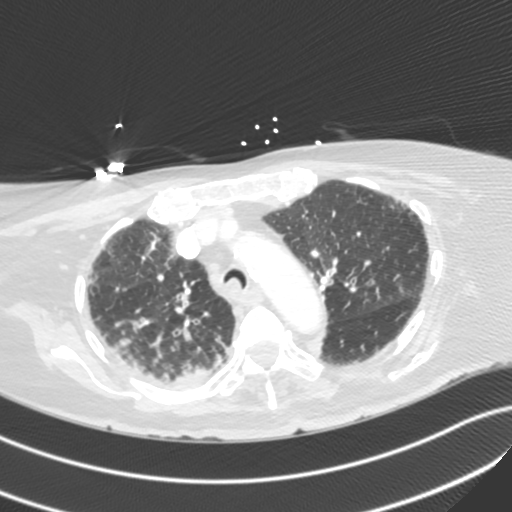
[im 358/460  soft-tissue]
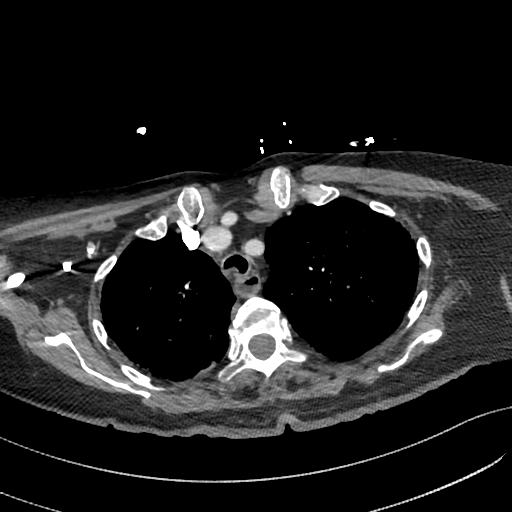
[im 409/460  lung]
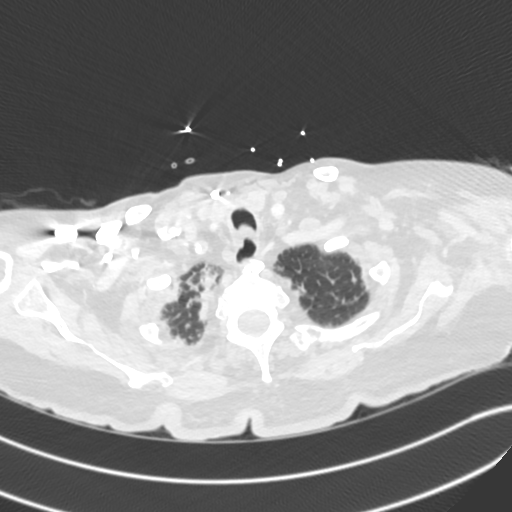
[im 434/460  soft-tissue]
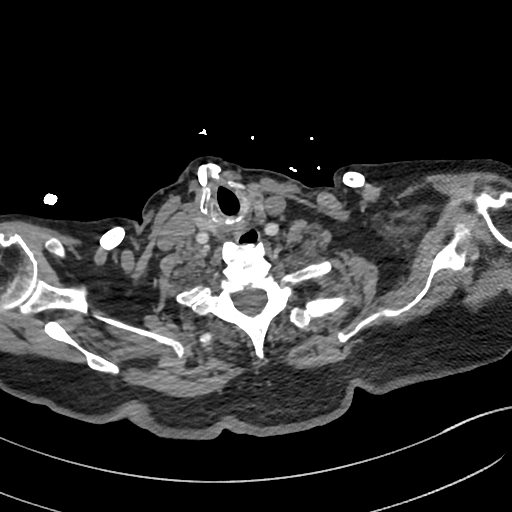

[Series 10: cor · coronal · 0.68mm/px · 3 of 156 slices shown]
[im 39/156  soft-tissue]
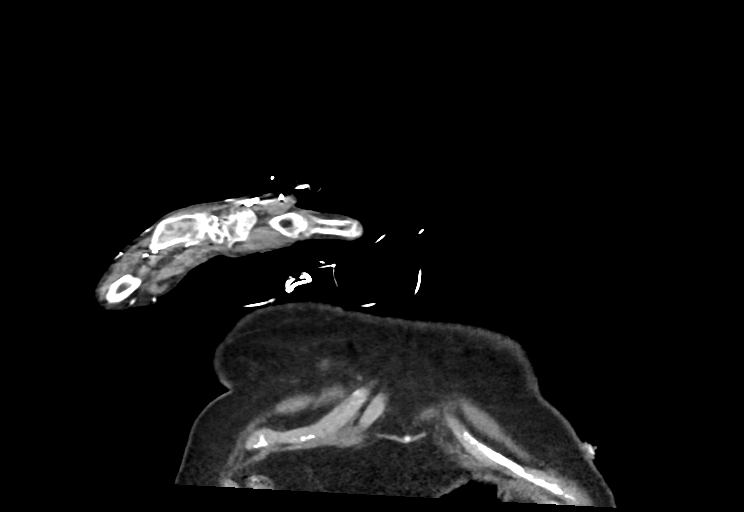
[im 78/156  soft-tissue]
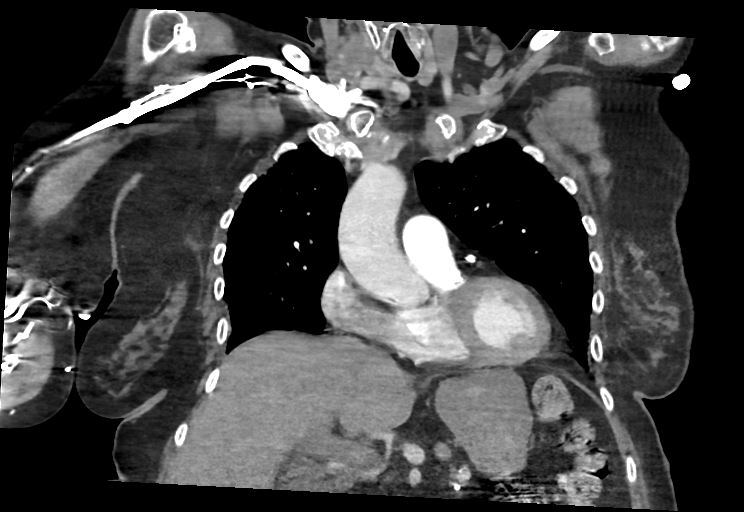
[im 117/156  soft-tissue]
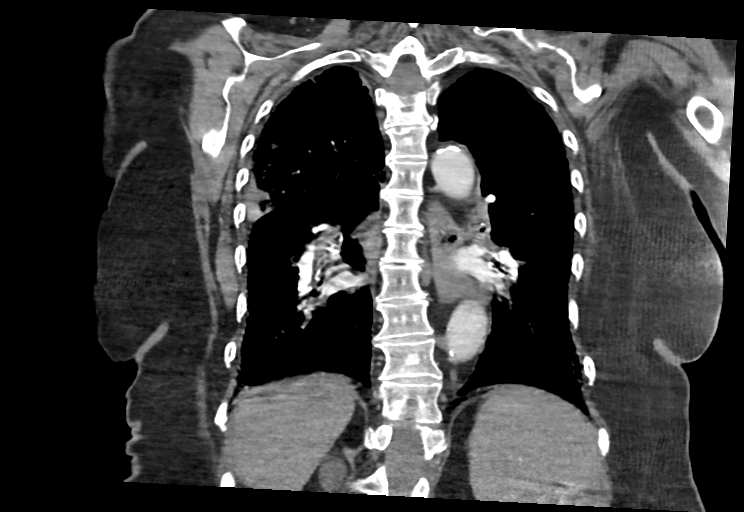

[17 of 46 positions shown; findings below may reference images not displayed]

FINDINGS: Cardiovascular: Good contrast bolus timing in the pulmonary arterial
tree. Mild respiratory motion. No central or hilar pulmonary artery
filling defect. Upper and middle lobe branches also appear patent.
But bilateral lower lobe branch detail is degraded by motion.

Coronary artery atherosclerosis and stent. Borderline cardiomegaly.
No pericardial effusion. Negative visible aorta aside from
atherosclerosis.

Mediastinum/Nodes: Small mediastinal lymph nodes appear reactive.
Similar hilar lymph nodes bilaterally.

Lungs/Pleura: Atelectatic changes to the major airways, especially
at the hila. Mild respiratory motion. Upper lobe tree-in-bud nodular
opacity greater on the right and with more confluent peribronchial
and peripheral nodular right upper lobe opacity, confluent
dependently. There is also patchy peripheral opacity in both
segments of the right middle lobe. Superimposed mild atelectasis in
both lungs. Trace right pleural effusion. Possible additional mild
peribronchial opacity in the right lower lobe although motion
artifact there.

Upper Abdomen: Negative visible liver, adrenal glands and bowel in
the upper abdomen. Dense probable embolization coils at the splenic
hilum, although the visible splenic parenchyma is normal. The
patient had a history of splenic artery aneurysm. Evidence of
pancreatic dystrophic calcifications. Somewhat atrophic appearing
visible right kidney.

Musculoskeletal: Degenerative changes in the spine. No acute or
suspicious osseous lesion identified.

Review of the MIP images confirms the above findings.
IMPRESSION: 1. Lower lobe pulmonary artery detail degraded by motion. Otherwise
negative for acute PE.

2. Multilobar respiratory infection, affecting most of the right
lung and also involving the left upper lobe. Favor viral/atypical
etiology.
Trace right pleural effusion.
Reactive appearing mediastinal and hilar lymph nodes.

3. Coronary artery and Aortic Atherosclerosis ([id number]).

4. Partially visible sequelae of splenic artery aneurysm
embolization.
